# Patient Record
Sex: Female | Born: 1960 | Race: White | Hispanic: No | Marital: Married | Smoking: Former smoker
Health system: Southern US, Community
[De-identification: ages and names within clinical notes are randomized; demographics above are authoritative.]

## PROBLEM LIST (undated history)

## (undated) DIAGNOSIS — E0590 Thyrotoxicosis, unspecified without thyrotoxic crisis or storm: Secondary | ICD-10-CM

## (undated) DIAGNOSIS — G894 Chronic pain syndrome: Secondary | ICD-10-CM

## (undated) DIAGNOSIS — E119 Type 2 diabetes mellitus without complications: Secondary | ICD-10-CM

## (undated) DIAGNOSIS — J449 Chronic obstructive pulmonary disease, unspecified: Secondary | ICD-10-CM

## (undated) DIAGNOSIS — G43909 Migraine, unspecified, not intractable, without status migrainosus: Secondary | ICD-10-CM

## (undated) DIAGNOSIS — I10 Essential (primary) hypertension: Secondary | ICD-10-CM

## (undated) DIAGNOSIS — E876 Hypokalemia: Secondary | ICD-10-CM

## (undated) DIAGNOSIS — F1110 Opioid abuse, uncomplicated: Secondary | ICD-10-CM

## (undated) DIAGNOSIS — M069 Rheumatoid arthritis, unspecified: Secondary | ICD-10-CM

## (undated) DIAGNOSIS — N183 Chronic kidney disease, stage 3 unspecified: Secondary | ICD-10-CM

## (undated) DIAGNOSIS — I2510 Atherosclerotic heart disease of native coronary artery without angina pectoris: Secondary | ICD-10-CM

## (undated) DIAGNOSIS — E7800 Pure hypercholesterolemia, unspecified: Secondary | ICD-10-CM

## (undated) DIAGNOSIS — R51 Headache: Secondary | ICD-10-CM

## (undated) DIAGNOSIS — E875 Hyperkalemia: Secondary | ICD-10-CM

## (undated) DIAGNOSIS — K859 Acute pancreatitis without necrosis or infection, unspecified: Secondary | ICD-10-CM

## (undated) DIAGNOSIS — F329 Major depressive disorder, single episode, unspecified: Secondary | ICD-10-CM

## (undated) DIAGNOSIS — F32A Depression, unspecified: Secondary | ICD-10-CM

## (undated) DIAGNOSIS — Z9289 Personal history of other medical treatment: Secondary | ICD-10-CM

## (undated) HISTORY — DX: Personal history of other medical treatment: Z92.89

## (undated) HISTORY — PX: WISDOM TOOTH EXTRACTION: SHX21

## (undated) HISTORY — DX: Atherosclerotic heart disease of native coronary artery without angina pectoris: I25.10

## (undated) HISTORY — PX: NECK SURGERY: SHX720

## (undated) HISTORY — PX: TUBAL LIGATION: SHX77

## (undated) HISTORY — PX: CHOLECYSTECTOMY: SHX55

## (undated) HISTORY — PX: FRACTURE SURGERY: SHX138

## (undated) HISTORY — PX: CERVICAL POLYPECTOMY: SHX88

## (undated) HISTORY — DX: Chronic kidney disease, stage 3 unspecified: N18.30

---

## 2000-01-17 ENCOUNTER — Encounter: Payer: Self-pay | Admitting: Neurosurgery

## 2000-01-17 ENCOUNTER — Ambulatory Visit (HOSPITAL_COMMUNITY): Admission: RE | Admit: 2000-01-17 | Discharge: 2000-01-17 | Payer: Self-pay | Admitting: Neurosurgery

## 2000-02-14 ENCOUNTER — Ambulatory Visit (HOSPITAL_COMMUNITY): Admission: RE | Admit: 2000-02-14 | Discharge: 2000-02-14 | Payer: Self-pay | Admitting: Neurosurgery

## 2000-05-28 ENCOUNTER — Ambulatory Visit (HOSPITAL_COMMUNITY): Admission: RE | Admit: 2000-05-28 | Discharge: 2000-05-29 | Payer: Self-pay | Admitting: Neurosurgery

## 2000-05-28 ENCOUNTER — Encounter: Payer: Self-pay | Admitting: Neurosurgery

## 2004-05-02 ENCOUNTER — Emergency Department: Payer: Self-pay | Admitting: Emergency Medicine

## 2004-08-20 ENCOUNTER — Other Ambulatory Visit: Payer: Self-pay

## 2004-08-20 ENCOUNTER — Emergency Department: Payer: Self-pay | Admitting: Emergency Medicine

## 2004-09-30 ENCOUNTER — Ambulatory Visit: Payer: Self-pay | Admitting: Gastroenterology

## 2005-07-03 ENCOUNTER — Other Ambulatory Visit: Payer: Self-pay

## 2005-07-03 ENCOUNTER — Inpatient Hospital Stay: Payer: Self-pay | Admitting: Internal Medicine

## 2005-07-05 ENCOUNTER — Other Ambulatory Visit: Payer: Self-pay

## 2005-07-06 ENCOUNTER — Other Ambulatory Visit: Payer: Self-pay

## 2006-06-04 ENCOUNTER — Other Ambulatory Visit: Payer: Self-pay

## 2006-06-04 ENCOUNTER — Emergency Department: Payer: Self-pay | Admitting: Internal Medicine

## 2006-08-10 ENCOUNTER — Other Ambulatory Visit: Payer: Self-pay

## 2006-08-10 ENCOUNTER — Inpatient Hospital Stay: Payer: Self-pay | Admitting: Internal Medicine

## 2006-08-11 ENCOUNTER — Inpatient Hospital Stay: Payer: Self-pay | Admitting: Psychiatry

## 2006-08-14 ENCOUNTER — Ambulatory Visit: Payer: Self-pay | Admitting: Unknown Physician Specialty

## 2006-08-18 ENCOUNTER — Ambulatory Visit: Payer: Self-pay | Admitting: Unknown Physician Specialty

## 2006-09-18 ENCOUNTER — Ambulatory Visit: Payer: Self-pay | Admitting: Unknown Physician Specialty

## 2006-10-19 ENCOUNTER — Ambulatory Visit: Payer: Self-pay | Admitting: Unknown Physician Specialty

## 2007-01-15 ENCOUNTER — Other Ambulatory Visit: Payer: Self-pay

## 2007-01-15 ENCOUNTER — Inpatient Hospital Stay: Payer: Self-pay | Admitting: Internal Medicine

## 2007-03-23 ENCOUNTER — Ambulatory Visit: Payer: Self-pay | Admitting: Unknown Physician Specialty

## 2007-04-18 ENCOUNTER — Ambulatory Visit: Payer: Self-pay | Admitting: Unknown Physician Specialty

## 2007-05-19 ENCOUNTER — Ambulatory Visit: Payer: Self-pay | Admitting: Unknown Physician Specialty

## 2008-03-07 ENCOUNTER — Inpatient Hospital Stay: Payer: Self-pay | Admitting: Psychiatry

## 2008-03-10 ENCOUNTER — Ambulatory Visit: Payer: Self-pay | Admitting: Unknown Physician Specialty

## 2008-03-20 ENCOUNTER — Ambulatory Visit: Payer: Self-pay | Admitting: Unknown Physician Specialty

## 2008-04-17 ENCOUNTER — Ambulatory Visit: Payer: Self-pay | Admitting: Unknown Physician Specialty

## 2008-10-24 ENCOUNTER — Emergency Department: Payer: Self-pay | Admitting: Emergency Medicine

## 2008-10-24 ENCOUNTER — Inpatient Hospital Stay: Payer: Self-pay | Admitting: Psychiatry

## 2009-07-30 ENCOUNTER — Inpatient Hospital Stay: Payer: Self-pay | Admitting: Unknown Physician Specialty

## 2009-08-14 ENCOUNTER — Ambulatory Visit: Payer: Self-pay | Admitting: Unknown Physician Specialty

## 2009-08-17 ENCOUNTER — Ambulatory Visit: Payer: Self-pay | Admitting: Unknown Physician Specialty

## 2009-09-17 ENCOUNTER — Ambulatory Visit: Payer: Self-pay | Admitting: Unknown Physician Specialty

## 2009-12-11 ENCOUNTER — Emergency Department: Payer: Self-pay | Admitting: Emergency Medicine

## 2010-02-09 ENCOUNTER — Emergency Department (HOSPITAL_COMMUNITY)
Admission: EM | Admit: 2010-02-09 | Discharge: 2010-02-09 | Payer: Self-pay | Source: Home / Self Care | Admitting: Emergency Medicine

## 2010-07-05 NOTE — Op Note (Signed)
[place]. [facility]  Patient:    Whitney Bernard, Whitney Bernard                          MRN: [id number] Proc. Date: 05/28/00 Adm. Date:  [id number] Attending:  Gerald Dexter                           Operative Report  PREOPERATIVE DIAGNOSIS:  Herniated disk C6-7 left.  POSTOPERATIVE DIAGNOSIS: Herniated disk C6-7 left.  OPERATION:   C6-7 anterior cervical diskectomy with fibular bone bank fusion with operating microscope.  SURGEON:  Reinaldo Meeker, M.D.  ASSISTANT:  Donalee Citrin, Jr.  DESCRIPTION OF PROCEDURE:  After being placed in the supine position and 5 pounds of halter traction, the patients neck was prepped and draped in the usual sterile fashion.  Localizing X-rays taken prior to incision to identify the appropriate level.  A transverse incision was then made in the right anterior neck starting at the midline and headed towards the medial aspect of the sternocleidomastoid muscle.  The platysma muscle was then incised transversely.  The natural fascial plane between the strap muscles medially and the sternocleidomastoid laterally was identified and followed down to the anterior aspect of the cervical spine.  Longus colli muscles were identified and split in the midline, and stripped away bilaterally with again resecting Key elevator.   Self-retaining retractor was placed for exposure.  A second x-ray was taken to confirm approach to the C6-7 level, and this was correct. Using a #15 blade, the annulus of the disk was incised.  Using pituitary rongeurs and curets, approximately 90% of the disk material was removed. High-speed drill was used to widen the interspace.  The microscope was draped, brought into the field, and used for the remainder of the case.  Using the customary high magnification, the remainder of the disk material at the level of the posterior longitudinal ligament was removed.  The ligament was then incised transversely.  The cut edges were  removed with the Kerrison punch. Very thorough decompression was carried out to the proximal foramen on the right where the nerve root was found to be not compressed.  As exposure was carried out towards the left, symptomatic side, a large amount of disk material within the foramen was identified and removed in a piecemeal fashion which ave excellent decompression of the underlying C7 nerve root.  At this point, inspection was carried out in all directions for any evidence of residual compression.  None could be identified.  Large amounts of irrigation were carried out.  Bleeding was controlled by coagulation and Gelfoam. Measurements were taken and 7 mm bone bank plugs reconstituted.  After again once more confirming hemostasis, the plug was impacted without difficulty. Final x-rays showed the plug to be in good position.  At this point, large amounts of irrigation were carried out and any bleeding controlled with bipolar coagulation.  The wound was then closed using interrupted Vicryl on the platysma muscle, inverted 5-0 PDS on the subcuticular layer, and Steri-Strips on the skin.  A sterile dressing and soft collar were applied. The patient was extubated and taken to recovery room in stable condition. DD:  05/28/00 TD:  05/28/00 Job: [id number]

## 2011-02-18 ENCOUNTER — Inpatient Hospital Stay: Payer: Self-pay | Admitting: Psychiatry

## 2011-02-18 LAB — BASIC METABOLIC PANEL
BUN: 28 mg/dL — ABNORMAL HIGH (ref 7–18)
Calcium, Total: 8.4 mg/dL — ABNORMAL LOW (ref 8.5–10.1)
Creatinine: 1.41 mg/dL — ABNORMAL HIGH (ref 0.60–1.30)
EGFR (African American): 50 — ABNORMAL LOW
EGFR (Non-African Amer.): 42 — ABNORMAL LOW
Glucose: 128 mg/dL — ABNORMAL HIGH (ref 65–99)
Potassium: 2.8 mmol/L — ABNORMAL LOW (ref 3.5–5.1)
Sodium: 144 mmol/L (ref 136–145)

## 2011-02-21 LAB — COMPREHENSIVE METABOLIC PANEL
Alkaline Phosphatase: 91 U/L (ref 50–136)
BUN: 20 mg/dL — ABNORMAL HIGH (ref 7–18)
Bilirubin,Total: 0.4 mg/dL (ref 0.2–1.0)
Chloride: 94 mmol/L — ABNORMAL LOW (ref 98–107)
Co2: 33 mmol/L — ABNORMAL HIGH (ref 21–32)
Creatinine: 1.35 mg/dL — ABNORMAL HIGH (ref 0.60–1.30)
EGFR (Non-African Amer.): 44 — ABNORMAL LOW
Glucose: 130 mg/dL — ABNORMAL HIGH (ref 65–99)
Osmolality: 276 (ref 275–301)
Potassium: 3 mmol/L — ABNORMAL LOW (ref 3.5–5.1)
Sodium: 136 mmol/L (ref 136–145)
Total Protein: 8 g/dL (ref 6.4–8.2)

## 2011-02-21 LAB — VALPROIC ACID LEVEL: Valproic Acid: 75 ug/mL

## 2011-10-12 ENCOUNTER — Emergency Department (HOSPITAL_COMMUNITY): Payer: 59

## 2011-10-12 ENCOUNTER — Emergency Department (HOSPITAL_COMMUNITY)
Admission: EM | Admit: 2011-10-12 | Discharge: 2011-10-12 | Disposition: A | Discharge status: Home / Self Care | Payer: 59 | Attending: Emergency Medicine | Admitting: Emergency Medicine

## 2011-10-12 ENCOUNTER — Encounter (HOSPITAL_COMMUNITY): Payer: Self-pay

## 2011-10-12 DIAGNOSIS — Z9089 Acquired absence of other organs: Secondary | ICD-10-CM | POA: Insufficient documentation

## 2011-10-12 DIAGNOSIS — R071 Chest pain on breathing: Secondary | ICD-10-CM | POA: Insufficient documentation

## 2011-10-12 DIAGNOSIS — M069 Rheumatoid arthritis, unspecified: Secondary | ICD-10-CM | POA: Insufficient documentation

## 2011-10-12 DIAGNOSIS — R0789 Other chest pain: Secondary | ICD-10-CM

## 2011-10-12 DIAGNOSIS — I10 Essential (primary) hypertension: Secondary | ICD-10-CM | POA: Insufficient documentation

## 2011-10-12 DIAGNOSIS — F17200 Nicotine dependence, unspecified, uncomplicated: Secondary | ICD-10-CM | POA: Insufficient documentation

## 2011-10-12 DIAGNOSIS — G8929 Other chronic pain: Secondary | ICD-10-CM | POA: Insufficient documentation

## 2011-10-12 DIAGNOSIS — E785 Hyperlipidemia, unspecified: Secondary | ICD-10-CM | POA: Insufficient documentation

## 2011-10-12 HISTORY — DX: Pure hypercholesterolemia, unspecified: E78.00

## 2011-10-12 HISTORY — DX: Essential (primary) hypertension: I10

## 2011-10-12 HISTORY — DX: Rheumatoid arthritis, unspecified: M06.9

## 2011-10-12 LAB — CBC
HCT: 36.2 % (ref 36.0–46.0)
Hemoglobin: 11.6 g/dL — ABNORMAL LOW (ref 12.0–15.0)
MCH: 29.7 pg (ref 26.0–34.0)
MCHC: 32 g/dL (ref 30.0–36.0)
MCV: 92.6 fL (ref 78.0–100.0)
Platelets: 395 K/uL (ref 150–400)
RBC: 3.91 MIL/uL (ref 3.87–5.11)
RDW: 14.8 % (ref 11.5–15.5)
WBC: 8 K/uL (ref 4.0–10.5)

## 2011-10-12 LAB — DIFFERENTIAL
Basophils Absolute: 0.1 K/uL (ref 0.0–0.1)
Basophils Relative: 1 % (ref 0–1)
Eosinophils Absolute: 0.4 K/uL (ref 0.0–0.7)
Eosinophils Relative: 5 % (ref 0–5)
Lymphocytes Relative: 31 % (ref 12–46)
Lymphs Abs: 2.4 K/uL (ref 0.7–4.0)
Monocytes Absolute: 0.6 K/uL (ref 0.1–1.0)
Monocytes Relative: 7 % (ref 3–12)
Neutro Abs: 4.6 K/uL (ref 1.7–7.7)
Neutrophils Relative %: 57 % (ref 43–77)

## 2011-10-12 LAB — COMPREHENSIVE METABOLIC PANEL WITH GFR
ALT: 15 U/L (ref 0–35)
AST: 14 U/L (ref 0–37)
Albumin: 3.7 g/dL (ref 3.5–5.2)
Alkaline Phosphatase: 141 U/L — ABNORMAL HIGH (ref 39–117)
BUN: 20 mg/dL (ref 6–23)
CO2: 22 meq/L (ref 19–32)
Calcium: 9.7 mg/dL (ref 8.4–10.5)
Chloride: 102 meq/L (ref 96–112)
Creatinine, Ser: 1.59 mg/dL — ABNORMAL HIGH (ref 0.50–1.10)
GFR calc Af Amer: 42 mL/min — ABNORMAL LOW
GFR calc non Af Amer: 36 mL/min — ABNORMAL LOW
Glucose, Bld: 94 mg/dL (ref 70–99)
Potassium: 3.8 meq/L (ref 3.5–5.1)
Sodium: 138 meq/L (ref 135–145)
Total Bilirubin: 0.1 mg/dL — ABNORMAL LOW (ref 0.3–1.2)
Total Protein: 8.5 g/dL — ABNORMAL HIGH (ref 6.0–8.3)

## 2011-10-12 LAB — TROPONIN I
Troponin I: <0.3 ng/mL
Troponin I: <0.3 ng/mL (ref <0.30)

## 2011-10-12 MED ORDER — ASPIRIN 81 MG PO CHEW
324.0000 mg | CHEWABLE_TABLET | Freq: Once | ORAL | Status: AC
  Administered 2011-10-12: 324 mg via ORAL
  Filled 2011-10-12: qty 4

## 2011-10-12 MED ORDER — NITROGLYCERIN 0.4 MG SL SUBL
0.4000 mg | SUBLINGUAL_TABLET | SUBLINGUAL | Status: DC | PRN
  Administered 2011-10-12: 0.4 mg via SUBLINGUAL
  Filled 2011-10-12: qty 25

## 2011-10-12 MED ORDER — KETOROLAC TROMETHAMINE 30 MG/ML IJ SOLN
30.0000 mg | Freq: Once | INTRAMUSCULAR | Status: AC
  Administered 2011-10-12: 30 mg via INTRAVENOUS
  Filled 2011-10-12: qty 1

## 2011-10-12 NOTE — ED Notes (Signed)
1.Pt reports that she checked her bp today at [facility] and here bp was high. 2. Chest pain since yesterday, pain comes and goes, sob at times,  Pain is midsternal no radiation.  Lightheaded  And episodes of nausea at times. 3. Has chronic pain from ra and needs something for pain.

## 2011-10-12 NOTE — ED Notes (Signed)
Went to start IV, draw blood, and give medication, patient made aware. Patient requesting to go to bathroom first. Patient ambulated to bathroom with steady gait. When being assisted back to room patient reports dizziness, and states she could not walk. Tech to get wheelchair. Patient stated "I cant' walk." Patient eased to floor by staff. Patient sat in floor and c/o no IV being started and no medicine being given. Patient states "I could be dying and ya'll wouldn't care.You didn't put an IV in when I first came in." Patient informed that we needed and got a ekg and chest x-ray, as well as EDP came to see her before we could start IV. Patient advocate at patient's side. Patient requesting to leave AMA and refusing to sign any paper work. EDP made aware. While talking to EDP patient agreed with staff to stay and take ordered nitro.

## 2011-10-12 NOTE — ED Notes (Signed)
Patient denies any relief with SL nitro x2. Blood pressure 100/67. Patient states "It feels like something is sitting on my chest." Reports shortness of breath. O2 sat 94 on room air. O2 applied via [place]. EDP made aware-order given.

## 2011-10-12 NOTE — ED Provider Notes (Signed)
History     CSN: [id number]  Arrival date & time 10/12/11  1616   First MD Initiated Contact with Patient 10/12/11 1631      Chief Complaint  Patient presents with  . Hypertension  . Chest Pain  . Shortness of Breath  . Pain    (Consider location/radiation/quality/duration/timing/severity/associated sxs/prior treatment) HPI  Patient states she is here because of blood pressure.  States she just started bp one week ago and told to check her bp frequently and checked at [facility] today at 191/122, so drove directly here.  This occurred at 2 p.m. Patient states she drove here from [place] because her husband works here.   Took her lisinopril 10 mg at 2 p.m. PMD is in [place].  Patient states chest pain began last night, pain under left breast feels like pressure with some radiation to bilateral arms.  Associated symptoms- nausea, dyspnea.  Pain constant, increases with husband- he's an ass yelled at her last night when she told him it hurt.  Patient took ibuprofen without relief.  Positive tobacco, fh- father mi in 47s.  Cardiology Dr. Gwen Pounds saw five years ago due to referral from pmd- states had echo normal.    Past Medical History  Diagnosis Date  . Hypertension   . Hypercholesteremia   . RA (rheumatoid arthritis)     Past Surgical History  Procedure Date  . Fracture surgery   . Cholecystectomy   . Cervical polypectomy   . Tubal ligation   . Neck surgery   . Wisdom tooth extraction     No family history on file.  History  Substance Use Topics  . Smoking status: Current Everyday Smoker    Types: Cigarettes  . Smokeless tobacco: Not on file  . Alcohol Use: Yes     occ    OB History    Grav Para Term Preterm Abortions TAB SAB Ect Mult Living                  Review of Systems  All other systems reviewed and are negative.    Allergies  Review of patient's allergies indicates not on file.  Home Medications  No current outpatient prescriptions on  file.  BP 118/66  Pulse 96  Temp 98.8 F (37.1 C) (Oral)  Resp 20  Ht 5\' 4"  (1.626 m)  Wt 157 lb (71.215 kg)  BMI 26.95 kg/m2  SpO2 96%  Physical Exam  Nursing note and vitals reviewed. Constitutional: She appears well-developed and well-nourished.  HENT:  Head: Normocephalic and atraumatic.  Eyes: Conjunctivae and EOM are normal. Pupils are equal, round, and reactive to light.  Neck: Normal range of motion. Neck supple.  Cardiovascular: Normal rate, regular rhythm, normal heart sounds and intact distal pulses.        Patient is diffusely tender to palpation across chest reproducing pain.  Pulmonary/Chest: Effort normal and breath sounds normal.  Abdominal: Soft. Bowel sounds are normal.  Musculoskeletal: Normal range of motion.  Neurological: She is alert.  Skin: Skin is warm and dry.  Psychiatric: Thought content normal. Her speech is rapid and/or pressured.       Patient is irritable    ED Course  Procedures (including critical care time)  Labs Reviewed - No data to display No results found.   No diagnosis found.    Date: 10/12/2011  Rate: 96  Rhythm: normal sinus rhythm  QRS Axis: normal  Intervals: normal  ST/T Wave abnormalities: normal  Conduction Disutrbances: none  Narrative Interpretation: unremarkable      MDM  Patient without any change in her pain with nitroglycerin. She had 3 sublingual nitroglycerin. She continues to request pain medicine for her hands. She was given Toradol. She continues to request narcotic pain medicine. She states that she's had the hand pain for an extended period of time it is worse. Her hand exam appears normal. She stated that she had chest pain since last night she's had 2 sets of troponins here that are normal with a normal EKG. Patient states that she is bipolar and has had decreased sleep. She is not suicidal or homicidal. Patient is very irritable. She appears to be somewhat fixated on the pain medicine. She is advised  she can get should get pain medicine from her primary care Dr. For her chronic pain her chest pain appears to be musculoskeletal with reproducible pain on her chest exam a normal EKG and 2 sets of normal troponins with chest pain going on for greater than 24 hours.        Hilario Quarry, MD 10/12/11 [id number]

## 2011-10-12 NOTE — ED Notes (Signed)
During triage of pt she stated she was here because her bp was elevated and then stated she was having severe pain from her RA, rn then asked, if she was here for chest pain, because reg. Listed as her chief complaint. She then stated "i told you why i am here" and "if you can't help me then i'll leave now", attempts made to calm pt, and attempted to explain that we needed to know why she was here for proper treatment. Pt then laid down on stretcher and calmly answered all ?'s and allowed staff to obtain ekg and place on monitors.

## 2011-10-12 NOTE — ED Notes (Signed)
Patient to restroom. Ambulatory. After restroom on the way back to her room patient wanted to sit in the floor. Patient allowed to sit in floor at request by tech and RN. Patient then assisted into wheel chair by tech and RN and escorted back to room. Patient sts "if ya'll arent going to do anything for me I need to leave". Explained to patient that the proper procedures have taken place for her complaint. And EKG has been completed, x-ray completed, MD came to the bedside and placed orders for IV, blood work, and medication. Patient states understanding, and keeps complaining that "no one is doing anything for her" patients RN and charge RN made aware. This situation witnessed by Barbara Cower, [place], Samara Snide, patient advocate, Jerrell Mylar RN.

## 2012-02-26 LAB — CBC
HGB: 13.5 g/dL (ref 12.0–16.0)
MCHC: 33 g/dL (ref 32.0–36.0)
Platelet: 389 10*3/uL (ref 150–440)
RDW: 15.4 % — ABNORMAL HIGH (ref 11.5–14.5)

## 2012-02-26 LAB — HEPATIC FUNCTION PANEL A (ARMC)
Albumin: 3.7 g/dL (ref 3.4–5.0)
Bilirubin, Direct: <0.1 mg/dL (ref 0.00–0.20)
Bilirubin,Total: 0.2 mg/dL (ref 0.2–1.0)
SGOT(AST): 13 U/L — ABNORMAL LOW (ref 15–37)
SGPT (ALT): 10 U/L — ABNORMAL LOW (ref 12–78)
Total Protein: 8.3 g/dL — ABNORMAL HIGH (ref 6.4–8.2)

## 2012-02-26 LAB — BASIC METABOLIC PANEL
Anion Gap: 10 (ref 7–16)
BUN: 32 mg/dL — ABNORMAL HIGH (ref 7–18)
Calcium, Total: 9.4 mg/dL (ref 8.5–10.1)
Chloride: 112 mmol/L — ABNORMAL HIGH (ref 98–107)
Glucose: 111 mg/dL — ABNORMAL HIGH (ref 65–99)
Potassium: 3.9 mmol/L (ref 3.5–5.1)
Sodium: 138 mmol/L (ref 136–145)

## 2012-02-26 LAB — URINALYSIS, COMPLETE
Ketone: NEGATIVE
Nitrite: NEGATIVE
RBC,UR: <1 /HPF (ref 0–5)
Specific Gravity: 1.015 (ref 1.003–1.030)
Squamous Epithelial: 2

## 2012-02-26 LAB — TROPONIN I: Troponin-I: <0.02 ng/mL

## 2012-02-26 LAB — CK TOTAL AND CKMB (NOT AT ARMC): CK, Total: 26 U/L (ref 21–215)

## 2012-02-27 ENCOUNTER — Inpatient Hospital Stay: Payer: Self-pay | Admitting: Specialist

## 2012-02-27 LAB — BASIC METABOLIC PANEL
Anion Gap: 9 (ref 7–16)
BUN: 27 mg/dL — ABNORMAL HIGH (ref 7–18)
Calcium, Total: 8.4 mg/dL — ABNORMAL LOW (ref 8.5–10.1)
Chloride: 117 mmol/L — ABNORMAL HIGH (ref 98–107)
Co2: 15 mmol/L — ABNORMAL LOW (ref 21–32)
Creatinine: 1.82 mg/dL — ABNORMAL HIGH (ref 0.60–1.30)
Glucose: 100 mg/dL — ABNORMAL HIGH (ref 65–99)
Osmolality: 286 (ref 275–301)
Sodium: 141 mmol/L (ref 136–145)

## 2012-02-28 LAB — COMPREHENSIVE METABOLIC PANEL
Alkaline Phosphatase: 108 U/L (ref 50–136)
BUN: 21 mg/dL — ABNORMAL HIGH (ref 7–18)
Calcium, Total: 8 mg/dL — ABNORMAL LOW (ref 8.5–10.1)
Chloride: 118 mmol/L — ABNORMAL HIGH (ref 98–107)
Co2: 14 mmol/L — ABNORMAL LOW (ref 21–32)
Creatinine: 1.6 mg/dL — ABNORMAL HIGH (ref 0.60–1.30)
EGFR (African American): 42 — ABNORMAL LOW
Glucose: 90 mg/dL (ref 65–99)
Potassium: 3.1 mmol/L — ABNORMAL LOW (ref 3.5–5.1)
SGOT(AST): 10 U/L — ABNORMAL LOW (ref 15–37)
Sodium: 143 mmol/L (ref 136–145)
Total Protein: 6 g/dL — ABNORMAL LOW (ref 6.4–8.2)

## 2012-02-28 LAB — LIPASE, BLOOD: Lipase: 655 U/L — ABNORMAL HIGH (ref 73–393)

## 2012-02-29 LAB — BASIC METABOLIC PANEL
Calcium, Total: 7.9 mg/dL — ABNORMAL LOW (ref 8.5–10.1)
Chloride: 117 mmol/L — ABNORMAL HIGH (ref 98–107)
Co2: 14 mmol/L — ABNORMAL LOW (ref 21–32)
Creatinine: 1.43 mg/dL — ABNORMAL HIGH (ref 0.60–1.30)
EGFR (Non-African Amer.): 42 — ABNORMAL LOW

## 2012-02-29 LAB — LIPASE, BLOOD: Lipase: 339 U/L (ref 73–393)

## 2012-03-01 LAB — LIPASE, BLOOD: Lipase: 191 U/L (ref 73–393)

## 2012-03-03 LAB — PATHOLOGY REPORT

## 2012-03-12 ENCOUNTER — Inpatient Hospital Stay: Payer: Self-pay | Admitting: Internal Medicine

## 2012-03-12 LAB — CBC
MCH: 30.1 pg (ref 26.0–34.0)
MCHC: 32.6 g/dL (ref 32.0–36.0)
Platelet: 351 10*3/uL (ref 150–440)
WBC: 13 10*3/uL — ABNORMAL HIGH (ref 3.6–11.0)

## 2012-03-12 LAB — COMPREHENSIVE METABOLIC PANEL
Anion Gap: 11 (ref 7–16)
BUN: 37 mg/dL — ABNORMAL HIGH (ref 7–18)
Calcium, Total: 8.1 mg/dL — ABNORMAL LOW (ref 8.5–10.1)
Chloride: 105 mmol/L (ref 98–107)
Co2: 25 mmol/L (ref 21–32)
EGFR (African American): 52 — ABNORMAL LOW
Glucose: 117 mg/dL — ABNORMAL HIGH (ref 65–99)
Osmolality: 291 (ref 275–301)
Potassium: 4 mmol/L (ref 3.5–5.1)
SGOT(AST): 33 U/L (ref 15–37)
SGPT (ALT): 34 U/L (ref 12–78)
Sodium: 141 mmol/L (ref 136–145)

## 2012-03-12 LAB — URINALYSIS, COMPLETE
Bilirubin,UR: NEGATIVE
Blood: NEGATIVE
Glucose,UR: NEGATIVE mg/dL (ref 0–75)
RBC,UR: 2 /HPF (ref 0–5)
Specific Gravity: 1.024 (ref 1.003–1.030)
Squamous Epithelial: 2

## 2012-03-12 LAB — LIPASE, BLOOD: Lipase: 687 U/L — ABNORMAL HIGH (ref 73–393)

## 2012-03-13 LAB — BASIC METABOLIC PANEL
Anion Gap: 9 (ref 7–16)
Co2: 22 mmol/L (ref 21–32)
Creatinine: 1.1 mg/dL (ref 0.60–1.30)
EGFR (African American): >60
EGFR (Non-African Amer.): 57 — ABNORMAL LOW
Glucose: 85 mg/dL (ref 65–99)
Osmolality: 284 (ref 275–301)
Potassium: 4.2 mmol/L (ref 3.5–5.1)
Sodium: 142 mmol/L (ref 136–145)

## 2012-03-13 LAB — LIPID PANEL
Cholesterol: 211 mg/dL — ABNORMAL HIGH (ref 0–200)
HDL Cholesterol: 36 mg/dL — ABNORMAL LOW (ref 40–60)
Ldl Cholesterol, Calc: 103 mg/dL — ABNORMAL HIGH (ref 0–100)
VLDL Cholesterol, Calc: 72 mg/dL — ABNORMAL HIGH (ref 5–40)

## 2012-03-13 LAB — CBC WITH DIFFERENTIAL/PLATELET
Basophil #: 0.1 10*3/uL (ref 0.0–0.1)
Basophil %: 1.3 %
HGB: 10 g/dL — ABNORMAL LOW (ref 12.0–16.0)
Lymphocyte #: 2.6 10*3/uL (ref 1.0–3.6)
Lymphocyte %: 35.6 %
Monocyte %: 11.2 %
Neutrophil #: 3.3 10*3/uL (ref 1.4–6.5)

## 2012-03-13 LAB — LIPASE, BLOOD: Lipase: 186 U/L (ref 73–393)

## 2012-03-15 LAB — BASIC METABOLIC PANEL
BUN: 12 mg/dL (ref 7–18)
Calcium, Total: 8.5 mg/dL (ref 8.5–10.1)
Chloride: 108 mmol/L — ABNORMAL HIGH (ref 98–107)
Co2: 26 mmol/L (ref 21–32)
Creatinine: 1.19 mg/dL (ref 0.60–1.30)
EGFR (African American): >60
EGFR (Non-African Amer.): 52 — ABNORMAL LOW
Sodium: 141 mmol/L (ref 136–145)

## 2012-03-16 LAB — STOOL CULTURE

## 2012-03-23 DIAGNOSIS — K279 Peptic ulcer, site unspecified, unspecified as acute or chronic, without hemorrhage or perforation: Secondary | ICD-10-CM | POA: Insufficient documentation

## 2012-04-02 ENCOUNTER — Emergency Department: Payer: Self-pay | Admitting: Emergency Medicine

## 2012-04-02 LAB — COMPREHENSIVE METABOLIC PANEL
Albumin: 4 g/dL (ref 3.4–5.0)
BUN: 16 mg/dL (ref 7–18)
Bilirubin,Total: 0.2 mg/dL (ref 0.2–1.0)
Calcium, Total: 9.2 mg/dL (ref 8.5–10.1)
Chloride: 107 mmol/L (ref 98–107)
Co2: 16 mmol/L — ABNORMAL LOW (ref 21–32)
Creatinine: 1.51 mg/dL — ABNORMAL HIGH (ref 0.60–1.30)
Glucose: 136 mg/dL — ABNORMAL HIGH (ref 65–99)
Osmolality: 277 (ref 275–301)
Potassium: 3.1 mmol/L — ABNORMAL LOW (ref 3.5–5.1)
Sodium: 137 mmol/L (ref 136–145)
Total Protein: 8.4 g/dL — ABNORMAL HIGH (ref 6.4–8.2)

## 2012-04-02 LAB — URINALYSIS, COMPLETE
Blood: NEGATIVE
Glucose,UR: NEGATIVE mg/dL (ref 0–75)
Hyaline Cast: 4
Ketone: NEGATIVE
Ph: 6 (ref 4.5–8.0)
Protein: 75
RBC,UR: 2 /HPF (ref 0–5)
Specific Gravity: 1.03 (ref 1.003–1.030)
Squamous Epithelial: 7

## 2012-04-02 LAB — DRUG SCREEN, URINE
Cannabinoid 50 Ng, Ur ~~LOC~~: NEGATIVE (ref ?–50)
Cocaine Metabolite,Ur ~~LOC~~: NEGATIVE (ref ?–300)
Methadone, Ur Screen: NEGATIVE (ref ?–300)
Opiate, Ur Screen: NEGATIVE (ref ?–300)
Phencyclidine (PCP) Ur S: NEGATIVE (ref ?–25)
Tricyclic, Ur Screen: POSITIVE (ref ?–1000)

## 2012-04-02 LAB — CBC
HGB: 13.7 g/dL (ref 12.0–16.0)
MCHC: 31.7 g/dL — ABNORMAL LOW (ref 32.0–36.0)
RBC: 4.75 10*6/uL (ref 3.80–5.20)
RDW: 15.3 % — ABNORMAL HIGH (ref 11.5–14.5)
WBC: 12.8 10*3/uL — ABNORMAL HIGH (ref 3.6–11.0)

## 2012-04-02 LAB — ETHANOL: Ethanol %: <0.003 % (ref 0.000–0.080)

## 2012-04-02 LAB — LIPASE, BLOOD: Lipase: 85 U/L (ref 73–393)

## 2012-04-04 LAB — URINE CULTURE

## 2012-04-11 ENCOUNTER — Inpatient Hospital Stay: Payer: Self-pay | Admitting: Internal Medicine

## 2012-04-11 LAB — URINALYSIS, COMPLETE
Bacteria: NONE SEEN
Bilirubin,UR: NEGATIVE
Blood: NEGATIVE
Glucose,UR: NEGATIVE mg/dL (ref 0–75)
Ph: 6 (ref 4.5–8.0)
Protein: 30
Squamous Epithelial: 1

## 2012-04-11 LAB — COMPREHENSIVE METABOLIC PANEL
Alkaline Phosphatase: 169 U/L — ABNORMAL HIGH (ref 50–136)
Bilirubin,Total: 0.2 mg/dL (ref 0.2–1.0)
Calcium, Total: 8.8 mg/dL (ref 8.5–10.1)
Creatinine: 1.58 mg/dL — ABNORMAL HIGH (ref 0.60–1.30)
EGFR (African American): 43 — ABNORMAL LOW
Osmolality: 277 (ref 275–301)
SGPT (ALT): 10 U/L — ABNORMAL LOW (ref 12–78)
Sodium: 138 mmol/L (ref 136–145)
Total Protein: 8.1 g/dL (ref 6.4–8.2)

## 2012-04-11 LAB — LIPASE, BLOOD: Lipase: 100 U/L (ref 73–393)

## 2012-04-11 LAB — CBC
HGB: 14.6 g/dL (ref 12.0–16.0)
MCH: 29.4 pg (ref 26.0–34.0)
MCHC: 33.5 g/dL (ref 32.0–36.0)
Platelet: 443 10*3/uL — ABNORMAL HIGH (ref 150–440)
RDW: 16.1 % — ABNORMAL HIGH (ref 11.5–14.5)

## 2012-04-11 LAB — MAGNESIUM: Magnesium: 1.5 mg/dL — ABNORMAL LOW

## 2012-04-12 LAB — CBC WITH DIFFERENTIAL/PLATELET
Basophil #: 0.1 10*3/uL (ref 0.0–0.1)
Basophil %: 0.6 %
Lymphocyte #: 2 10*3/uL (ref 1.0–3.6)
MCH: 29.1 pg (ref 26.0–34.0)
MCHC: 32.8 g/dL (ref 32.0–36.0)
Monocyte #: 1.3 x10 3/mm — ABNORMAL HIGH (ref 0.2–0.9)
Monocyte %: 8.5 %
Neutrophil #: 11.6 10*3/uL — ABNORMAL HIGH (ref 1.4–6.5)
Neutrophil %: 76.6 %
RBC: 4.04 10*6/uL (ref 3.80–5.20)
WBC: 15.2 10*3/uL — ABNORMAL HIGH (ref 3.6–11.0)

## 2012-04-12 LAB — BASIC METABOLIC PANEL
Anion Gap: 9 (ref 7–16)
BUN: 6 mg/dL — ABNORMAL LOW (ref 7–18)
Co2: 19 mmol/L — ABNORMAL LOW (ref 21–32)
Creatinine: 1.17 mg/dL (ref 0.60–1.30)
EGFR (African American): >60
EGFR (Non-African Amer.): 53 — ABNORMAL LOW
Glucose: 91 mg/dL (ref 65–99)
Potassium: 2.8 mmol/L — ABNORMAL LOW (ref 3.5–5.1)

## 2012-04-12 LAB — CLOSTRIDIUM DIFFICILE BY PCR

## 2012-04-13 LAB — WBC: WBC: 10 10*3/uL (ref 3.6–11.0)

## 2012-04-13 LAB — BASIC METABOLIC PANEL
Anion Gap: 9 (ref 7–16)
BUN: 4 mg/dL — ABNORMAL LOW (ref 7–18)
Chloride: 115 mmol/L — ABNORMAL HIGH (ref 98–107)
Co2: 19 mmol/L — ABNORMAL LOW (ref 21–32)
Creatinine: 1.34 mg/dL — ABNORMAL HIGH (ref 0.60–1.30)
EGFR (Non-African Amer.): 45 — ABNORMAL LOW
Glucose: 93 mg/dL (ref 65–99)
Osmolality: 282 (ref 275–301)
Sodium: 143 mmol/L (ref 136–145)

## 2012-04-13 LAB — URINE CULTURE

## 2012-04-13 LAB — MAGNESIUM: Magnesium: 1.5 mg/dL — ABNORMAL LOW

## 2012-04-14 LAB — BASIC METABOLIC PANEL
Anion Gap: 8 (ref 7–16)
BUN: 3 mg/dL — ABNORMAL LOW (ref 7–18)
Creatinine: 1.14 mg/dL (ref 0.60–1.30)
EGFR (Non-African Amer.): 55 — ABNORMAL LOW
Osmolality: 281 (ref 275–301)
Potassium: 4.1 mmol/L (ref 3.5–5.1)
Sodium: 143 mmol/L (ref 136–145)

## 2012-04-28 ENCOUNTER — Ambulatory Visit: Payer: Self-pay | Admitting: Gastroenterology

## 2012-04-30 LAB — PATHOLOGY REPORT

## 2012-05-11 DIAGNOSIS — F329 Major depressive disorder, single episode, unspecified: Secondary | ICD-10-CM | POA: Insufficient documentation

## 2012-05-11 DIAGNOSIS — K529 Noninfective gastroenteritis and colitis, unspecified: Secondary | ICD-10-CM | POA: Insufficient documentation

## 2012-05-11 DIAGNOSIS — F339 Major depressive disorder, recurrent, unspecified: Secondary | ICD-10-CM | POA: Insufficient documentation

## 2012-05-11 DIAGNOSIS — E7800 Pure hypercholesterolemia, unspecified: Secondary | ICD-10-CM | POA: Insufficient documentation

## 2012-06-22 DIAGNOSIS — F419 Anxiety disorder, unspecified: Secondary | ICD-10-CM | POA: Insufficient documentation

## 2012-06-22 DIAGNOSIS — F418 Other specified anxiety disorders: Secondary | ICD-10-CM | POA: Insufficient documentation

## 2012-09-03 ENCOUNTER — Emergency Department: Payer: Self-pay | Admitting: Emergency Medicine

## 2012-09-03 LAB — COMPREHENSIVE METABOLIC PANEL
Albumin: 3.3 g/dL — ABNORMAL LOW (ref 3.4–5.0)
Alkaline Phosphatase: 177 U/L — ABNORMAL HIGH (ref 50–136)
Anion Gap: 8 (ref 7–16)
BUN: 21 mg/dL — ABNORMAL HIGH (ref 7–18)
Bilirubin,Total: 0.1 mg/dL — ABNORMAL LOW (ref 0.2–1.0)
Calcium, Total: 9.1 mg/dL (ref 8.5–10.1)
Chloride: 108 mmol/L — ABNORMAL HIGH (ref 98–107)
EGFR (African American): 51 — ABNORMAL LOW
Osmolality: 283 (ref 275–301)
Potassium: 3.9 mmol/L (ref 3.5–5.1)
SGOT(AST): 24 U/L (ref 15–37)
Sodium: 140 mmol/L (ref 136–145)
Total Protein: 7.8 g/dL (ref 6.4–8.2)

## 2012-09-03 LAB — URINALYSIS, COMPLETE
Bilirubin,UR: NEGATIVE
Blood: NEGATIVE
Glucose,UR: NEGATIVE mg/dL (ref 0–75)
Hyaline Cast: 13
Nitrite: NEGATIVE
Ph: 5 (ref 4.5–8.0)
Protein: NEGATIVE
Specific Gravity: 1.025 (ref 1.003–1.030)

## 2012-09-03 LAB — DRUG SCREEN, URINE
Amphetamines, Ur Screen: NEGATIVE (ref ?–1000)
Barbiturates, Ur Screen: POSITIVE (ref ?–200)
Cannabinoid 50 Ng, Ur ~~LOC~~: NEGATIVE (ref ?–50)
MDMA (Ecstasy)Ur Screen: NEGATIVE (ref ?–500)
Methadone, Ur Screen: NEGATIVE (ref ?–300)
Phencyclidine (PCP) Ur S: NEGATIVE (ref ?–25)

## 2012-09-03 LAB — ACETAMINOPHEN LEVEL
Acetaminophen: <2 ug/mL
Acetaminophen: <2 ug/mL

## 2012-09-03 LAB — CBC
HGB: 11 g/dL — ABNORMAL LOW (ref 12.0–16.0)
MCH: 28.1 pg (ref 26.0–34.0)
MCV: 84 fL (ref 80–100)
RDW: 14.7 % — ABNORMAL HIGH (ref 11.5–14.5)

## 2012-09-03 LAB — SALICYLATE LEVEL: Salicylates, Serum: 5.3 mg/dL — ABNORMAL HIGH

## 2012-11-09 ENCOUNTER — Other Ambulatory Visit: Payer: Self-pay | Admitting: Neurosurgery

## 2012-11-17 ENCOUNTER — Encounter (HOSPITAL_COMMUNITY): Payer: Self-pay | Admitting: Respiratory Therapy

## 2012-11-17 ENCOUNTER — Other Ambulatory Visit (HOSPITAL_COMMUNITY): Payer: Self-pay | Admitting: *Deleted

## 2012-11-17 NOTE — Pre-Procedure Instructions (Signed)
Whitney Bernard  11/17/2012   Your procedure is scheduled on:  November 26, 2012 at 11:30 AM   Report to [facility] Main Entrance "A" at 9:30 AM.   Call this number if you have problems the morning of surgery: [phone]   Remember:   Do not eat food or drink liquids after midnight Thursday, 11/25/12   Take these medicines the morning of surgery with A SIP OF WATER: ALPRAZolam Prudy Feeler) - if needed, Oxycodone HCl - if needed, PARoxetine (PAXIL)   Stop all Vitamins, Herbal medications, Aspirin and NSAIDS (Ibuprofen, Motrin, Aleve, Naproxen, etc) as of 11/19/12.      Do not wear jewelry, make-up or nail polish.  Do not wear lotions, powders, or perfumes. You may wear deodorant.  Do not shave 48 hours prior to surgery.   Do not bring valuables to the hospital.  [facility] is not responsible                  for any belongings or valuables.               Contacts, dentures or bridgework may not be worn into surgery.  Leave suitcase in the car. After surgery it may be brought to your room.  For patients admitted to the hospital, discharge time is determined by your                treatment team.             Special Instructions: Shower using CHG 2 nights before surgery and the night before surgery.  If you shower the day of surgery use CHG.  Use special wash - you have one bottle of CHG for all showers.  You should use approximately 1/3 of the bottle for each shower.   Please read over the following fact sheets that you were given: Pain Booklet, Coughing and Deep Breathing, MRSA Information and Surgical Site Infection Prevention

## 2012-11-18 ENCOUNTER — Encounter (HOSPITAL_COMMUNITY)
Admission: RE | Admit: 2012-11-18 | Discharge: 2012-11-18 | Disposition: A | Discharge status: Home / Self Care | Payer: 59 | Source: Ambulatory Visit | Attending: Neurosurgery | Admitting: Neurosurgery

## 2012-11-18 ENCOUNTER — Encounter (HOSPITAL_COMMUNITY): Payer: Self-pay

## 2012-11-18 DIAGNOSIS — Z01810 Encounter for preprocedural cardiovascular examination: Secondary | ICD-10-CM | POA: Insufficient documentation

## 2012-11-18 DIAGNOSIS — Z01812 Encounter for preprocedural laboratory examination: Secondary | ICD-10-CM | POA: Insufficient documentation

## 2012-11-18 DIAGNOSIS — Z01818 Encounter for other preprocedural examination: Secondary | ICD-10-CM | POA: Insufficient documentation

## 2012-11-18 HISTORY — DX: Chronic obstructive pulmonary disease, unspecified: J44.9

## 2012-11-18 HISTORY — DX: Major depressive disorder, single episode, unspecified: F32.9

## 2012-11-18 HISTORY — DX: Depression, unspecified: F32.A

## 2012-11-18 HISTORY — DX: Headache: R51

## 2012-11-18 LAB — CBC
HCT: 35.4 % — ABNORMAL LOW (ref 36.0–46.0)
Hemoglobin: 11.5 g/dL — ABNORMAL LOW (ref 12.0–15.0)
MCHC: 32.5 g/dL (ref 30.0–36.0)
MCV: 82.9 fL (ref 78.0–100.0)
Platelets: 351 10*3/uL (ref 150–400)
RDW: 15.3 % (ref 11.5–15.5)

## 2012-11-18 LAB — BASIC METABOLIC PANEL
BUN: 16 mg/dL (ref 6–23)
CO2: 28 mEq/L (ref 19–32)
Chloride: 99 mEq/L (ref 96–112)
Creatinine, Ser: 1.22 mg/dL — ABNORMAL HIGH (ref 0.50–1.10)
GFR calc Af Amer: 57 mL/min — ABNORMAL LOW (ref >90)
Glucose, Bld: 89 mg/dL (ref 70–99)
Potassium: 3.6 mEq/L (ref 3.5–5.1)

## 2012-11-18 LAB — SURGICAL PCR SCREEN: Staphylococcus aureus: POSITIVE — AB

## 2012-11-25 MED ORDER — DEXAMETHASONE SODIUM PHOSPHATE 10 MG/ML IJ SOLN
10.0000 mg | INTRAMUSCULAR | Status: AC
  Administered 2012-11-26: 10 mg via INTRAVENOUS
  Filled 2012-11-25: qty 1

## 2012-11-25 MED ORDER — CEFAZOLIN SODIUM-DEXTROSE 2-3 GM-% IV SOLR
2.0000 g | INTRAVENOUS | Status: AC
  Administered 2012-11-26: 2 g via INTRAVENOUS
  Filled 2012-11-25: qty 50

## 2012-11-26 ENCOUNTER — Ambulatory Visit (HOSPITAL_COMMUNITY): Payer: 59

## 2012-11-26 ENCOUNTER — Ambulatory Visit (HOSPITAL_COMMUNITY): Payer: 59 | Admitting: Certified Registered"

## 2012-11-26 ENCOUNTER — Encounter (HOSPITAL_COMMUNITY): Payer: 59 | Admitting: Certified Registered"

## 2012-11-26 ENCOUNTER — Ambulatory Visit (HOSPITAL_COMMUNITY)
Admission: RE | Admit: 2012-11-26 | Discharge: 2012-11-27 | Disposition: A | Discharge status: Home / Self Care | Payer: 59 | Source: Ambulatory Visit | Attending: Neurosurgery | Admitting: Neurosurgery

## 2012-11-26 ENCOUNTER — Encounter (HOSPITAL_COMMUNITY)
Admission: RE | Disposition: A | Discharge status: Home / Self Care | Payer: Self-pay | Source: Ambulatory Visit | Attending: Neurosurgery

## 2012-11-26 ENCOUNTER — Encounter (HOSPITAL_COMMUNITY): Payer: Self-pay | Admitting: Anesthesiology

## 2012-11-26 DIAGNOSIS — Z23 Encounter for immunization: Secondary | ICD-10-CM | POA: Insufficient documentation

## 2012-11-26 DIAGNOSIS — M47812 Spondylosis without myelopathy or radiculopathy, cervical region: Secondary | ICD-10-CM | POA: Insufficient documentation

## 2012-11-26 DIAGNOSIS — J449 Chronic obstructive pulmonary disease, unspecified: Secondary | ICD-10-CM | POA: Insufficient documentation

## 2012-11-26 DIAGNOSIS — I10 Essential (primary) hypertension: Secondary | ICD-10-CM | POA: Insufficient documentation

## 2012-11-26 DIAGNOSIS — J4489 Other specified chronic obstructive pulmonary disease: Secondary | ICD-10-CM | POA: Insufficient documentation

## 2012-11-26 DIAGNOSIS — Z79899 Other long term (current) drug therapy: Secondary | ICD-10-CM | POA: Insufficient documentation

## 2012-11-26 HISTORY — PX: ANTERIOR CERVICAL DECOMP/DISCECTOMY FUSION: SHX1161

## 2012-11-26 SURGERY — ANTERIOR CERVICAL DECOMPRESSION/DISCECTOMY FUSION 2 LEVELS
Anesthesia: General | Site: Neck | Wound class: Clean

## 2012-11-26 MED ORDER — 0.9 % SODIUM CHLORIDE (POUR BTL) OPTIME
TOPICAL | Status: DC | PRN
  Administered 2012-11-26: 1000 mL

## 2012-11-26 MED ORDER — LIDOCAINE HCL (CARDIAC) 20 MG/ML IV SOLN
INTRAVENOUS | Status: DC | PRN
  Administered 2012-11-26: 100 mg via INTRAVENOUS

## 2012-11-26 MED ORDER — POTASSIUM CHLORIDE CRYS ER 20 MEQ PO TBCR
20.0000 meq | EXTENDED_RELEASE_TABLET | Freq: Three times a day (TID) | ORAL | Status: DC
  Administered 2012-11-26: 20 meq via ORAL
  Filled 2012-11-26 (×5): qty 1

## 2012-11-26 MED ORDER — PHENOL 1.4 % MT LIQD: 1.0000 | OROMUCOSAL | Status: DC | PRN

## 2012-11-26 MED ORDER — ONDANSETRON HCL 4 MG/2ML IJ SOLN
INTRAMUSCULAR | Status: DC | PRN
  Administered 2012-11-26: 4 mg via INTRAMUSCULAR

## 2012-11-26 MED ORDER — ONDANSETRON HCL 4 MG/2ML IJ SOLN: 4.0000 mg | INTRAMUSCULAR | Status: DC | PRN

## 2012-11-26 MED ORDER — HEMOSTATIC AGENTS (NO CHARGE) OPTIME
TOPICAL | Status: DC | PRN
  Administered 2012-11-26: 1 via TOPICAL

## 2012-11-26 MED ORDER — THROMBIN 5000 UNITS EX SOLR
CUTANEOUS | Status: DC | PRN
  Administered 2012-11-26 (×2): 5000 [IU] via TOPICAL

## 2012-11-26 MED ORDER — NEOSTIGMINE METHYLSULFATE 1 MG/ML IJ SOLN
INTRAMUSCULAR | Status: DC | PRN
  Administered 2012-11-26: 4 mg via INTRAVENOUS

## 2012-11-26 MED ORDER — PANTOPRAZOLE SODIUM 40 MG IV SOLR
40.0000 mg | Freq: Every day | INTRAVENOUS | Status: DC
  Administered 2012-11-26: 40 mg via INTRAVENOUS
  Filled 2012-11-26 (×2): qty 40

## 2012-11-26 MED ORDER — HYDROMORPHONE HCL PF 1 MG/ML IJ SOLN
0.2500 mg | INTRAMUSCULAR | Status: DC | PRN
  Administered 2012-11-26 (×2): 0.5 mg via INTRAVENOUS

## 2012-11-26 MED ORDER — ROCURONIUM BROMIDE 100 MG/10ML IV SOLN
INTRAVENOUS | Status: DC | PRN
  Administered 2012-11-26: 50 mg via INTRAVENOUS

## 2012-11-26 MED ORDER — MENTHOL 3 MG MT LOZG
1.0000 | LOZENGE | OROMUCOSAL | Status: DC | PRN
  Filled 2012-11-26: qty 9

## 2012-11-26 MED ORDER — LACTATED RINGERS IV SOLN
INTRAVENOUS | Status: DC
  Administered 2012-11-26 (×2): via INTRAVENOUS

## 2012-11-26 MED ORDER — PAROXETINE HCL 20 MG PO TABS
40.0000 mg | ORAL_TABLET | Freq: Every day | ORAL | Status: DC
  Administered 2012-11-27: 40 mg via ORAL
  Filled 2012-11-26: qty 2

## 2012-11-26 MED ORDER — VECURONIUM BROMIDE 10 MG IV SOLR
INTRAVENOUS | Status: DC | PRN
  Administered 2012-11-26: 1 mg via INTRAVENOUS
  Administered 2012-11-26: 3 mg via INTRAVENOUS
  Administered 2012-11-26: 1 mg via INTRAVENOUS
  Administered 2012-11-26: 2 mg via INTRAVENOUS

## 2012-11-26 MED ORDER — GLYCOPYRROLATE 0.2 MG/ML IJ SOLN
INTRAMUSCULAR | Status: DC | PRN
  Administered 2012-11-26: 0.6 mg via INTRAVENOUS

## 2012-11-26 MED ORDER — MIDAZOLAM HCL 5 MG/5ML IJ SOLN
INTRAMUSCULAR | Status: DC | PRN
  Administered 2012-11-26: 2 mg via INTRAVENOUS

## 2012-11-26 MED ORDER — PROPOFOL 10 MG/ML IV BOLUS
INTRAVENOUS | Status: DC | PRN
  Administered 2012-11-26: 200 mg via INTRAVENOUS

## 2012-11-26 MED ORDER — FUROSEMIDE 40 MG PO TABS
40.0000 mg | ORAL_TABLET | Freq: Every day | ORAL | Status: DC
  Filled 2012-11-26 (×2): qty 1

## 2012-11-26 MED ORDER — CYCLOBENZAPRINE HCL 10 MG PO TABS
ORAL_TABLET | ORAL | Status: AC
  Filled 2012-11-26: qty 1

## 2012-11-26 MED ORDER — MUPIROCIN 2 % EX OINT
TOPICAL_OINTMENT | Freq: Two times a day (BID) | CUTANEOUS | Status: DC
  Administered 2012-11-26 – 2012-11-27 (×2): via NASAL

## 2012-11-26 MED ORDER — DEXAMETHASONE SODIUM PHOSPHATE 4 MG/ML IJ SOLN
4.0000 mg | Freq: Four times a day (QID) | INTRAMUSCULAR | Status: AC
  Administered 2012-11-26: 4 mg via INTRAVENOUS
  Filled 2012-11-26: qty 1

## 2012-11-26 MED ORDER — HYDROMORPHONE HCL PF 1 MG/ML IJ SOLN
1.0000 mg | INTRAMUSCULAR | Status: DC | PRN
  Administered 2012-11-26 – 2012-11-27 (×3): 1.5 mg via INTRAMUSCULAR
  Administered 2012-11-27: 1 mg via INTRAMUSCULAR
  Administered 2012-11-27: 1.5 mg via INTRAMUSCULAR
  Filled 2012-11-26 (×2): qty 2
  Filled 2012-11-26: qty 1
  Filled 2012-11-26 (×2): qty 2

## 2012-11-26 MED ORDER — DEXAMETHASONE 4 MG PO TABS
4.0000 mg | ORAL_TABLET | Freq: Four times a day (QID) | ORAL | Status: AC
  Administered 2012-11-26: 4 mg via ORAL
  Filled 2012-11-26: qty 1

## 2012-11-26 MED ORDER — SODIUM CHLORIDE 0.9 % IJ SOLN: 3.0000 mL | INTRAMUSCULAR | Status: DC | PRN

## 2012-11-26 MED ORDER — ZOLPIDEM TARTRATE 5 MG PO TABS: 5.0000 mg | ORAL_TABLET | Freq: Every evening | ORAL | Status: DC | PRN

## 2012-11-26 MED ORDER — OXYCODONE HCL 5 MG PO TABS
ORAL_TABLET | ORAL | Status: AC
  Filled 2012-11-26: qty 2

## 2012-11-26 MED ORDER — METOCLOPRAMIDE HCL 5 MG/ML IJ SOLN: 10.0000 mg | Freq: Once | INTRAMUSCULAR | Status: DC | PRN

## 2012-11-26 MED ORDER — CEFAZOLIN SODIUM-DEXTROSE 2-3 GM-% IV SOLR
2.0000 g | Freq: Three times a day (TID) | INTRAVENOUS | Status: AC
  Administered 2012-11-26 – 2012-11-27 (×2): 2 g via INTRAVENOUS
  Filled 2012-11-26 (×2): qty 50

## 2012-11-26 MED ORDER — OXYCODONE HCL 5 MG PO TABS: 5.0000 mg | ORAL_TABLET | Freq: Once | ORAL | Status: DC | PRN

## 2012-11-26 MED ORDER — KCL IN DEXTROSE-NACL 20-5-0.45 MEQ/L-%-% IV SOLN
80.0000 mL/h | INTRAVENOUS | Status: DC
  Administered 2012-11-26: 80 mL/h via INTRAVENOUS
  Filled 2012-11-26 (×3): qty 1000

## 2012-11-26 MED ORDER — CYCLOBENZAPRINE HCL 10 MG PO TABS
10.0000 mg | ORAL_TABLET | Freq: Three times a day (TID) | ORAL | Status: DC | PRN
  Administered 2012-11-26 – 2012-11-27 (×3): 10 mg via ORAL
  Filled 2012-11-26 (×2): qty 1

## 2012-11-26 MED ORDER — SODIUM CHLORIDE 0.9 % IR SOLN
Status: DC | PRN
  Administered 2012-11-26: 13:00:00

## 2012-11-26 MED ORDER — ALPRAZOLAM 0.5 MG PO TABS
0.5000 mg | ORAL_TABLET | Freq: Two times a day (BID) | ORAL | Status: DC
  Administered 2012-11-26 – 2012-11-27 (×2): 0.5 mg via ORAL
  Filled 2012-11-26 (×2): qty 1

## 2012-11-26 MED ORDER — OXYCODONE HCL 5 MG PO TABS
10.0000 mg | ORAL_TABLET | ORAL | Status: DC | PRN
  Administered 2012-11-26 – 2012-11-27 (×5): 10 mg via ORAL
  Filled 2012-11-26 (×4): qty 2

## 2012-11-26 MED ORDER — ACETAMINOPHEN 325 MG PO TABS: 650.0000 mg | ORAL_TABLET | ORAL | Status: DC | PRN

## 2012-11-26 MED ORDER — ARTIFICIAL TEARS OP OINT
TOPICAL_OINTMENT | OPHTHALMIC | Status: DC | PRN
  Administered 2012-11-26: 1 via OPHTHALMIC

## 2012-11-26 MED ORDER — SODIUM CHLORIDE 0.9 % IJ SOLN
3.0000 mL | Freq: Two times a day (BID) | INTRAMUSCULAR | Status: DC
  Administered 2012-11-26 (×2): 3 mL via INTRAVENOUS

## 2012-11-26 MED ORDER — OXYCODONE HCL 5 MG/5ML PO SOLN: 5.0000 mg | Freq: Once | ORAL | Status: DC | PRN

## 2012-11-26 MED ORDER — LACTATED RINGERS IV SOLN: INTRAVENOUS | Status: DC

## 2012-11-26 MED ORDER — HYDROMORPHONE HCL PF 1 MG/ML IJ SOLN
INTRAMUSCULAR | Status: AC
  Filled 2012-11-26: qty 1

## 2012-11-26 MED ORDER — FENTANYL CITRATE 0.05 MG/ML IJ SOLN
INTRAMUSCULAR | Status: DC | PRN
  Administered 2012-11-26 (×2): 50 ug via INTRAVENOUS
  Administered 2012-11-26: 100 ug via INTRAVENOUS
  Administered 2012-11-26: 50 ug via INTRAVENOUS
  Administered 2012-11-26: 100 ug via INTRAVENOUS
  Administered 2012-11-26: 50 ug via INTRAVENOUS

## 2012-11-26 MED ORDER — ACETAMINOPHEN 650 MG RE SUPP: 650.0000 mg | RECTAL | Status: DC | PRN

## 2012-11-26 SURGICAL SUPPLY — 56 items
APL SKNCLS STERI-STRIP NONHPOA (GAUZE/BANDAGES/DRESSINGS) ×1
BAG DECANTER FOR FLEXI CONT (MISCELLANEOUS) ×2 IMPLANT
BENZOIN TINCTURE PRP APPL 2/3 (GAUZE/BANDAGES/DRESSINGS) ×4 IMPLANT
BIT DRILL TRINICA 2.3MM (BIT) IMPLANT
BRUSH SCRUB EZ PLAIN DRY (MISCELLANEOUS) ×2 IMPLANT
BUR MATCHSTICK NEURO 3.0 LAGG (BURR) ×2 IMPLANT
CANISTER SUCTION 2500CC (MISCELLANEOUS) ×2 IMPLANT
CLOTH BEACON ORANGE TIMEOUT ST (SAFETY) ×2 IMPLANT
CONT SPEC 4OZ CLIKSEAL STRL BL (MISCELLANEOUS) ×2 IMPLANT
DRAPE C-ARM 42X72 X-RAY (DRAPES) ×4 IMPLANT
DRAPE LAPAROTOMY 100X72 PEDS (DRAPES) ×2 IMPLANT
DRAPE MICROSCOPE ZEISS OPMI (DRAPES) ×2 IMPLANT
DRAPE SURG 17X23 STRL (DRAPES) ×4 IMPLANT
DRESSING TELFA 8X3 (GAUZE/BANDAGES/DRESSINGS) ×2 IMPLANT
DRILL BIT TRINICA 2.3MM (BIT) ×2
DRSG OPSITE POSTOP 4X6 (GAUZE/BANDAGES/DRESSINGS) ×1 IMPLANT
DURAPREP 6ML APPLICATOR 50/CS (WOUND CARE) ×2 IMPLANT
ELECT COATED BLADE 2.86 ST (ELECTRODE) ×2 IMPLANT
ELECT REM PT RETURN 9FT ADLT (ELECTROSURGICAL) ×2
ELECTRODE REM PT RTRN 9FT ADLT (ELECTROSURGICAL) ×1 IMPLANT
GAUZE SPONGE 4X4 16PLY XRAY LF (GAUZE/BANDAGES/DRESSINGS) IMPLANT
GLOVE ECLIPSE 8.0 STRL XLNG CF (GLOVE) ×2 IMPLANT
GLOVE ECLIPSE 8.5 STRL (GLOVE) ×1 IMPLANT
GLOVE EXAM NITRILE LRG STRL (GLOVE) IMPLANT
GLOVE EXAM NITRILE MD LF STRL (GLOVE) ×2 IMPLANT
GLOVE EXAM NITRILE XL STR (GLOVE) IMPLANT
GLOVE EXAM NITRILE XS STR PU (GLOVE) IMPLANT
GOWN BRE IMP SLV AUR LG STRL (GOWN DISPOSABLE) IMPLANT
GOWN BRE IMP SLV AUR XL STRL (GOWN DISPOSABLE) IMPLANT
GOWN STRL REIN 2XL LVL4 (GOWN DISPOSABLE) ×2 IMPLANT
HEAD HALTER (SOFTGOODS) ×2 IMPLANT
KIT BASIN OR (CUSTOM PROCEDURE TRAY) ×2 IMPLANT
KIT ROOM TURNOVER OR (KITS) ×2 IMPLANT
NDL SPNL 20GX3.5 QUINCKE YW (NEEDLE) ×1 IMPLANT
NEEDLE SPNL 20GX3.5 QUINCKE YW (NEEDLE) ×2 IMPLANT
NS IRRIG 1000ML POUR BTL (IV SOLUTION) ×2 IMPLANT
PACK LAMINECTOMY NEURO (CUSTOM PROCEDURE TRAY) ×2 IMPLANT
PAD ARMBOARD 7.5X6 YLW CONV (MISCELLANEOUS) ×2 IMPLANT
PATTIES SURGICAL .75X.75 (GAUZE/BANDAGES/DRESSINGS) ×2 IMPLANT
PLATE 36MM (Plate) ×1 IMPLANT
PUTTY BONE GRAFT KIT 2.5ML (Bone Implant) ×1 IMPLANT
RUBBERBAND STERILE (MISCELLANEOUS) ×4 IMPLANT
SCREW SD FIXED 12MM (Screw) ×6 IMPLANT
SPACER TMS 11X14X6MM (Spacer) ×2 IMPLANT
SPONGE GAUZE 4X4 12PLY (GAUZE/BANDAGES/DRESSINGS) ×2 IMPLANT
SPONGE INTESTINAL PEANUT (DISPOSABLE) ×2 IMPLANT
SPONGE SURGIFOAM ABS GEL SZ50 (HEMOSTASIS) ×2 IMPLANT
STRIP CLOSURE SKIN 1/2X4 (GAUZE/BANDAGES/DRESSINGS) ×2 IMPLANT
SUT PDS AB 5-0 P3 18 (SUTURE) ×2 IMPLANT
SUT VIC AB 3-0 CP2 18 (SUTURE) ×2 IMPLANT
SYR 20ML ECCENTRIC (SYRINGE) IMPLANT
TAPE STRIPS DRAPE STRL (GAUZE/BANDAGES/DRESSINGS) ×1 IMPLANT
TOWEL OR 17X24 6PK STRL BLUE (TOWEL DISPOSABLE) ×2 IMPLANT
TOWEL OR 17X26 10 PK STRL BLUE (TOWEL DISPOSABLE) ×2 IMPLANT
TRAP SPECIMEN MUCOUS 40CC (MISCELLANEOUS) IMPLANT
WATER STERILE IRR 1000ML POUR (IV SOLUTION) ×2 IMPLANT

## 2012-11-26 NOTE — Op Note (Signed)
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 Localizing fluoroscopy was used prior to incision to identify the appropriate level. Transverse incision was made in the right anterior neck started the midline and headed towards the medial aspect the sternocleidomastoid muscle. The platysma muscle was then incised transversely and the natural fascial plane between the strap muscles medially and the sternocleidomastoid laterally was identified and followed down to the anterior aspect the cervical spine. The longus coli muscles were identified and split in the midline and stripped away bilaterally with unipolar coagulation and Kitner dissection. Self-retaining tract was placed for exposure and x-ray showed approach the appropriate levels. Using a 15 blade the Sutton disc at C4-5 and C5-6 were incised. Using pituitary rongeurs and curettes approximately 90% of the disc material both levels was removed. High-speed drill was used to widen the interspace and bony shavings were saved for use later in the case. At this time the microscope was draped brought in the field and used for the remainder of the case. Starting C4-5 the remainder of the disc material was removed down the posterior longitudinal ligament. Ligament was then incised transversely and the cut edges removed a Kerrison punch. Thorough decompression was carried out with a bony overgrowth be removed to decompress the spinal dura and foramen bilaterally. Similar decompression was then carried out at C5-6 once again generously decompressing the spinal dura and foramen bilaterally with the bony overgrowth be  removed to visualize the underlying C6 nerve roots at this level. At this time inspection was carried out all directions any evidence of residual compression and none could be identified. Irrigation was carried out and any bleeding control proper coagulation Gelfoam. Measurements were taken and to 6 mm.graft were chosen. Therefore mixture of autologous bone morselized allograft and impacted the space without difficulty. Fossae showed good position of the plugs. An appropriate length plate was then chosen. Under fluoroscopic guidance drill holes were placed followed by placing of 12 mm screws x6. Locking mechanism was rotated locked position and fossae showed good position of the plates screws and plugs. Irrigation was carried out and any bleeding control proper coagulation. The was then closed with inverted Vicryl on the platysma muscle inverted 5-0 PDS in the subcuticular and Steri-Strips were placed on the skin. A sterile dressing was then applied the patient was extubated and taken to recovery room in stable condition.

## 2012-11-26 NOTE — Plan of Care (Signed)
Problem: Consults Goal: Diagnosis - Spinal Surgery Outcome: Completed/Met Date Met:  11/26/12 Cervical Spine Fusion     

## 2012-11-26 NOTE — Anesthesia Preprocedure Evaluation (Addendum)
Anesthesia Evaluation  Patient identified by MRN, date of birth, ID band Patient awake    Reviewed: Allergy & Precautions, H&P , NPO status , Patient's Chart, lab work & pertinent test results, reviewed documented beta blocker date and time   Airway Mallampati: II TM Distance: >3 FB Neck ROM: full    Dental  (+) Teeth Intact and Dental Advisory Given   Pulmonary COPDformer smoker,  breath sounds clear to auscultation        Cardiovascular hypertension, Pt. on medications Rhythm:regular Rate:Normal     Neuro/Psych  Headaches, PSYCHIATRIC DISORDERS Depression  Neuromuscular disease    GI/Hepatic negative GI ROS, Neg liver ROS,   Endo/Other  negative endocrine ROS  Renal/GU negative Renal ROS  negative genitourinary   Musculoskeletal  (+) Arthritis -,   Abdominal   Peds  Hematology negative hematology ROS (+)   Anesthesia Other Findings See surgeon's H&P   Reproductive/Obstetrics negative OB ROS                          Anesthesia Physical Anesthesia Plan  ASA: II  Anesthesia Plan: General   Post-op Pain Management:    Induction: Intravenous  Airway Management Planned: Oral ETT  Additional Equipment:   Intra-op Plan:   Post-operative Plan: Extubation in OR  Informed Consent: I have reviewed the patients History and Physical, chart, labs and discussed the procedure including the risks, benefits and alternatives for the proposed anesthesia with the patient or authorized representative who has indicated his/her understanding and acceptance.   Dental Advisory Given  Plan Discussed with: CRNA and Surgeon  Anesthesia Plan Comments:         Anesthesia Quick Evaluation

## 2012-11-26 NOTE — Transfer of Care (Signed)
Immediate Anesthesia Transfer of Care Note  Patient: HABIBA TRELOAR  Procedure(s) Performed: Procedure(s) with comments: ANTERIOR CERVICAL DECOMPRESSION/DISCECTOMY FUSION 2 LEVELS (N/A) - ANTERIOR CERVICAL DECOMPRESSION/DISCECTOMY FUSION 2 LEVELS  Patient Location: PACU  Anesthesia Type:General  Level of Consciousness: awake and oriented  Airway & Oxygen Therapy: Patient Spontanous Breathing and Patient connected to nasal cannula oxygen  Post-op Assessment: Report given to PACU RN and Patient moving all extremities X 4  Post vital signs: Reviewed and stable  Complications: No apparent anesthesia complications

## 2012-11-26 NOTE — H&P (Signed)
Whitney Bernard is an 52 y.o. female.   Chief Complaint: Neck pain with arm numbness HPI: The patient is a 52 year old female who is an old patient who has had previous back surgery C6-7 2002. She did well at that time and now comes complaint neck pain with tingling and numbness in her arms of one month's duration. Whitney Bernard of some pain her hands. Some of the varus muscle excellent aorta significant improvement. She doesn't have any imaging studies. After evaluation the office the patient was underwent MRI scanning of the neck. She then took prednisone stenotic give her any improvement. An MRI scan was reviewed which showed mild disease at C3 for a significant disease at C4-5 and C5-6 with foraminal stenosis bilaterally. After discussing the options the patient requested surgery and now comes for a C4-5 and C5-6 anterior cervical discectomy fusion plating. I had a long discussion with her regarding the risks and benefits of surgical intervention. The risks discussed include but are not limited to bleeding infection weakness and possible fluid leak coma quadriplegia hoarseness and death. We have discussed alternative methods of therapy offered risks and benefits of nonintervention. Whitney Bernard has had the opportunity to ask numerous questions and appears to understand. With this information in hand she has requested we proceed with surgery.  Past Medical History  Diagnosis Date  . Hypertension   . Hypercholesteremia   . RA (rheumatoid arthritis)   . Depression   . COPD (chronic obstructive pulmonary disease)   . Headache(784.0)     migraines    Past Surgical History  Procedure Laterality Date  . Fracture surgery    . Cholecystectomy    . Cervical polypectomy    . Tubal ligation    . Neck surgery    . Wisdom tooth extraction      No family history on file. Social History:  reports that she quit smoking 12 days ago. Her smoking use included Cigarettes. She has a 19 pack-year smoking history. She has  never used smokeless tobacco. She reports that she drinks alcohol. She reports that she does not use illicit drugs.  Allergies:  Allergies  Allergen Reactions  . Almond Oil Hives    Medications Prior to Admission  Medication Sig Dispense Refill  . ALPRAZolam (XANAX) 0.5 MG tablet Take 0.5 mg by mouth 2 (two) times daily. Panic attacks      . butalbital-acetaminophen-caffeine (FIORICET, ESGIC) 50-325-40 MG per tablet Take 1 tablet by mouth as needed. Headaches      . furosemide (LASIX) 40 MG tablet Take 40 mg by mouth daily.      . Oxycodone HCl 10 MG TABS Take 10 mg by mouth every 4 (four) hours as needed (for pain).      Marland Kitchen PARoxetine (PAXIL) 40 MG tablet Take 40 mg by mouth daily.      . potassium chloride SA (K-DUR,KLOR-CON) 20 MEQ tablet Take 20 mEq by mouth 3 (three) times daily.         No results found for this or any previous visit (from the past 48 hour(s)). No results found.  Review of systems positive for nasal congestion sinus problems a history of colitis psoriasis clinician arms anxiety bipolar disorder and food allergies  There were no vitals taken for this visit.  The patient is awake or and oriented. His no facial asymmetry. Gait is nonantalgic. Requested 1-2+ and equal. Strength is diffusely weak is a question of giveaway. Sensation is intact. Assessment/Plan Impression is that of spondylosis with  foraminal stenosis C4-5 C5-6. The plan is for a two-level anterior cervical discectomy fusion plating.  Reinaldo Meeker, MD 11/26/2012, 11:09 AM

## 2012-11-26 NOTE — Anesthesia Procedure Notes (Signed)
Procedure Name: Intubation Date/Time: 11/26/2012 11:57 AM Performed by: De Nurse Pre-anesthesia Checklist: Patient identified, Emergency Drugs available, Suction available, Patient being monitored and Timeout performed Patient Re-evaluated:Patient Re-evaluated prior to inductionOxygen Delivery Method: Circle system utilized Preoxygenation: Pre-oxygenation with 100% oxygen Intubation Type: IV induction Ventilation: Mask ventilation without difficulty and Oral airway inserted - appropriate to patient size Laryngoscope Size: Mac and 4 Grade View: Grade I Tube type: Oral Tube size: 7.0 mm Number of attempts: 1 Airway Equipment and Method: Stylet Placement Confirmation: ETT inserted through vocal cords under direct vision,  positive ETCO2 and breath sounds checked- equal and bilateral Secured at: 22 cm Tube secured with: Tape Dental Injury: Teeth and Oropharynx as per pre-operative assessment

## 2012-11-26 NOTE — Preoperative (Signed)
Beta Blockers   Reason not to administer Beta Blockers:Not Applicable 

## 2012-11-26 NOTE — Progress Notes (Signed)
Orthopedic Tech Progress Note Patient Details:  Whitney Bernard 1960-09-16 [id number]  Ortho Devices Type of Ortho Device: Soft collar Ortho Device/Splint Interventions: Application   Shawnie Pons 11/26/2012, 3:58 PM

## 2012-11-26 NOTE — Anesthesia Postprocedure Evaluation (Signed)
Anesthesia Post Note  Patient: Whitney Bernard  Procedure(s) Performed: Procedure(s) (LRB): ANTERIOR CERVICAL DECOMPRESSION/DISCECTOMY FUSION 2 LEVELS (N/A)  Anesthesia type: General  Patient location: PACU  Post pain: Pain level controlled  Post assessment: Patient's Cardiovascular Status Stable  Last Vitals:  Filed Vitals:   11/26/12 1449  BP:   Pulse:   Temp: 37.1 C  Resp:     Post vital signs: Reviewed and stable  Level of consciousness: alert  Complications: No apparent anesthesia complications

## 2012-11-27 MED ORDER — CHLORHEXIDINE GLUCONATE CLOTH 2 % EX PADS
6.0000 | MEDICATED_PAD | Freq: Every day | CUTANEOUS | Status: DC
  Administered 2012-11-27: 6 via TOPICAL

## 2012-11-27 MED ORDER — CYCLOBENZAPRINE HCL 10 MG PO TABS: 10.0000 mg | ORAL_TABLET | Freq: Three times a day (TID) | ORAL | Status: DC | PRN

## 2012-11-27 MED ORDER — OXYCODONE HCL 10 MG PO TABS: 10.0000 mg | ORAL_TABLET | ORAL | Status: DC | PRN

## 2012-11-27 MED ORDER — INFLUENZA VAC SPLIT QUAD 0.5 ML IM SUSP
0.5000 mL | INTRAMUSCULAR | Status: AC
  Administered 2012-11-27: 0.5 mL via INTRAMUSCULAR
  Filled 2012-11-27: qty 0.5

## 2012-11-27 MED ORDER — PANTOPRAZOLE SODIUM 40 MG PO TBEC
40.0000 mg | DELAYED_RELEASE_TABLET | Freq: Every day | ORAL | Status: DC
  Administered 2012-11-27: 40 mg via ORAL
  Filled 2012-11-27: qty 1

## 2012-11-27 NOTE — Progress Notes (Signed)
Pt. discharged home accompanied by husband. Prescriptions and discharge instructions given with verbalization of understanding. Incision site on neck with no s/s of infection - no swelling, redness, bleeding, and/or drainage noted. Soft collar intact. Pain med given just before leaving. Opportunity given to ask questions but no question asked. Pt. transported out of this unit in wheelchair by the volunteer. 

## 2012-11-27 NOTE — Discharge Summary (Signed)
Physician Discharge Summary  Patient ID: Whitney Bernard MRN: [id number] DOB/AGE: [birth year] 52 y.o.  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 *   Discharged Condition: good  Hospital Course: 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 C4-5 C5-6 decompressive anterior cervical discectomy with trabecular metal interbody fusion and Trinica anterior cervical plating Post op she is speaking well with a strong voice Moving all extremities well Wound is clean dry and without signs of infection Ambulating, and voiding well  Consults: None  Significant Diagnostic Studies: none  Treatments: surgery: C4-5 C5-6 decompressive anterior cervical discectomy with trabecular metal interbody fusion and Trinica anterior cervical plating   Discharge Exam: Blood pressure 138/87, pulse 74, temperature 98.8 F (37.1 C), temperature source Oral, resp. rate 18, SpO2 92.00%. General appearance: alert, cooperative, appears stated age and no distress Neurologic: Alert and oriented X 3, normal strength and tone. Normal symmetric reflexes. Normal coordination and gait  Disposition: 01-Home or Self Care     Medication List         ALPRAZolam 0.5 MG tablet  Commonly known as:  XANAX  Take 0.5 mg by mouth 2 (two) times daily. Panic attacks     butalbital-acetaminophen-caffeine 50-325-40 MG per tablet  Commonly known as:  FIORICET, ESGIC  Take 1 tablet by mouth as needed. Headaches     cyclobenzaprine 10 MG tablet  Commonly known as:  FLEXERIL  Take 1 tablet (10 mg total) by mouth 3 (three) times daily as needed for muscle spasms.     furosemide 40 MG tablet  Commonly known as:  LASIX  Take 40 mg by mouth daily.      Oxycodone HCl 10 MG Tabs  Take 1 tablet (10 mg total) by mouth every 4 (four) hours as needed (for pain).     PARoxetine 40 MG tablet  Commonly known as:  PAXIL  Take 40 mg by mouth daily.     potassium chloride SA 20 MEQ tablet  Commonly known as:  K-DUR,KLOR-CON  Take 20 mEq by mouth 3 (three) times daily.           Follow-up Information   Follow up with Reinaldo Meeker, MD In 2 weeks. (call to make appointment)    Specialty:  Neurosurgery   Contact information:   [address] [place] [address] [phone]       Signed: Krrish Freund L 11/27/2012, 10:18 AM

## 2012-12-01 ENCOUNTER — Encounter (HOSPITAL_COMMUNITY): Payer: Self-pay | Admitting: Neurosurgery

## 2012-12-31 ENCOUNTER — Emergency Department: Payer: Self-pay | Admitting: Emergency Medicine

## 2012-12-31 LAB — URINALYSIS, COMPLETE
Bilirubin,UR: NEGATIVE
Blood: NEGATIVE
Glucose,UR: NEGATIVE mg/dL (ref 0–75)
Hyaline Cast: 7
Ketone: NEGATIVE
Leukocyte Esterase: NEGATIVE
Ph: 5 (ref 4.5–8.0)
Protein: 30
Specific Gravity: 1.031 (ref 1.003–1.030)
Squamous Epithelial: 1
WBC UR: NONE SEEN /HPF (ref 0–5)

## 2012-12-31 LAB — DRUG SCREEN, URINE
Amphetamines, Ur Screen: NEGATIVE (ref ?–1000)
Benzodiazepine, Ur Scrn: POSITIVE (ref ?–200)
Cannabinoid 50 Ng, Ur ~~LOC~~: NEGATIVE (ref ?–50)
Cocaine Metabolite,Ur ~~LOC~~: NEGATIVE (ref ?–300)
MDMA (Ecstasy)Ur Screen: NEGATIVE (ref ?–500)
Methadone, Ur Screen: NEGATIVE (ref ?–300)
Opiate, Ur Screen: POSITIVE (ref ?–300)
Tricyclic, Ur Screen: POSITIVE (ref ?–1000)

## 2012-12-31 LAB — CBC
HCT: 34.3 % — ABNORMAL LOW (ref 35.0–47.0)
HGB: 11.5 g/dL — ABNORMAL LOW (ref 12.0–16.0)
MCV: 78 fL — ABNORMAL LOW (ref 80–100)
Platelet: 370 10*3/uL (ref 150–440)
RDW: 17.1 % — ABNORMAL HIGH (ref 11.5–14.5)

## 2012-12-31 LAB — SALICYLATE LEVEL: Salicylates, Serum: <1.7 mg/dL

## 2012-12-31 LAB — COMPREHENSIVE METABOLIC PANEL
Albumin: 3.4 g/dL (ref 3.4–5.0)
Bilirubin,Total: 0.2 mg/dL (ref 0.2–1.0)
Chloride: 110 mmol/L — ABNORMAL HIGH (ref 98–107)
Co2: 23 mmol/L (ref 21–32)
EGFR (Non-African Amer.): 53 — ABNORMAL LOW
Glucose: 98 mg/dL (ref 65–99)
Potassium: 4.1 mmol/L (ref 3.5–5.1)
SGOT(AST): 24 U/L (ref 15–37)
SGPT (ALT): 24 U/L (ref 12–78)
Sodium: 138 mmol/L (ref 136–145)
Total Protein: 7.2 g/dL (ref 6.4–8.2)

## 2012-12-31 LAB — ACETAMINOPHEN LEVEL: Acetaminophen: <2 ug/mL

## 2012-12-31 LAB — ETHANOL
Ethanol %: <0.003 % (ref 0.000–0.080)
Ethanol: <3 mg/dL

## 2012-12-31 LAB — TSH: Thyroid Stimulating Horm: 0.949 u[IU]/mL

## 2013-01-03 ENCOUNTER — Emergency Department: Payer: Self-pay | Admitting: Emergency Medicine

## 2013-01-09 ENCOUNTER — Ambulatory Visit: Payer: Self-pay | Admitting: Family

## 2013-02-23 DIAGNOSIS — I10 Essential (primary) hypertension: Secondary | ICD-10-CM | POA: Insufficient documentation

## 2013-02-23 DIAGNOSIS — F1990 Other psychoactive substance use, unspecified, uncomplicated: Secondary | ICD-10-CM | POA: Insufficient documentation

## 2013-02-23 DIAGNOSIS — F1910 Other psychoactive substance abuse, uncomplicated: Secondary | ICD-10-CM

## 2013-05-11 ENCOUNTER — Emergency Department: Payer: Self-pay | Admitting: Emergency Medicine

## 2013-05-11 LAB — CBC WITH DIFFERENTIAL/PLATELET
BASOS ABS: 0.1 10*3/uL (ref 0.0–0.1)
Basophil %: 0.5 %
EOS ABS: 0 10*3/uL (ref 0.0–0.7)
Eosinophil %: 0.3 %
HCT: 41.3 % (ref 35.0–47.0)
HGB: 13 g/dL (ref 12.0–16.0)
LYMPHS ABS: 1.4 10*3/uL (ref 1.0–3.6)
Lymphocyte %: 10.3 %
MCH: 25.5 pg — ABNORMAL LOW (ref 26.0–34.0)
MCHC: 31.5 g/dL — ABNORMAL LOW (ref 32.0–36.0)
MCV: 81 fL (ref 80–100)
Monocyte #: 0.6 x10 3/mm (ref 0.2–0.9)
Monocyte %: 4.4 %
NEUTROS ABS: 11.7 10*3/uL — AB (ref 1.4–6.5)
NEUTROS PCT: 84.5 %
Platelet: 374 10*3/uL (ref 150–440)
RBC: 5.1 10*6/uL (ref 3.80–5.20)
RDW: 16.2 % — ABNORMAL HIGH (ref 11.5–14.5)
WBC: 13.9 10*3/uL — ABNORMAL HIGH (ref 3.6–11.0)

## 2013-05-11 LAB — URINALYSIS, COMPLETE
Bacteria: NONE SEEN
Bilirubin,UR: NEGATIVE
Blood: NEGATIVE
GLUCOSE, UR: NEGATIVE mg/dL (ref 0–75)
Hyaline Cast: 2
Ketone: NEGATIVE
NITRITE: NEGATIVE
Ph: 5 (ref 4.5–8.0)
Protein: NEGATIVE
RBC,UR: NONE SEEN /HPF (ref 0–5)
Specific Gravity: 1.016 (ref 1.003–1.030)
Squamous Epithelial: 4

## 2013-05-11 LAB — COMPREHENSIVE METABOLIC PANEL
ALK PHOS: 150 U/L — AB
AST: 15 U/L (ref 15–37)
Albumin: 3.8 g/dL (ref 3.4–5.0)
Anion Gap: 9 (ref 7–16)
BUN: 18 mg/dL (ref 7–18)
Bilirubin,Total: 0.2 mg/dL (ref 0.2–1.0)
CO2: 20 mmol/L — AB (ref 21–32)
CREATININE: 1.5 mg/dL — AB (ref 0.60–1.30)
Calcium, Total: 9 mg/dL (ref 8.5–10.1)
Chloride: 105 mmol/L (ref 98–107)
EGFR (African American): 45 — ABNORMAL LOW
EGFR (Non-African Amer.): 39 — ABNORMAL LOW
GLUCOSE: 116 mg/dL — AB (ref 65–99)
Osmolality: 271 (ref 275–301)
Potassium: 4.2 mmol/L (ref 3.5–5.1)
SGPT (ALT): 18 U/L (ref 12–78)
Sodium: 134 mmol/L — ABNORMAL LOW (ref 136–145)
Total Protein: 8.6 g/dL — ABNORMAL HIGH (ref 6.4–8.2)

## 2013-05-11 LAB — TROPONIN I: Troponin-I: <0.02 ng/mL

## 2013-05-11 LAB — LIPASE, BLOOD: LIPASE: 131 U/L (ref 73–393)

## 2013-05-23 LAB — COMPREHENSIVE METABOLIC PANEL
ALBUMIN: 3.7 g/dL (ref 3.4–5.0)
Alkaline Phosphatase: 130 U/L — ABNORMAL HIGH
Anion Gap: 8 (ref 7–16)
BUN: 31 mg/dL — ABNORMAL HIGH (ref 7–18)
Bilirubin,Total: 0.1 mg/dL — ABNORMAL LOW (ref 0.2–1.0)
CO2: 21 mmol/L (ref 21–32)
Calcium, Total: 8.3 mg/dL — ABNORMAL LOW (ref 8.5–10.1)
Chloride: 109 mmol/L — ABNORMAL HIGH (ref 98–107)
Creatinine: 1.67 mg/dL — ABNORMAL HIGH (ref 0.60–1.30)
GFR CALC AF AMER: 40 — AB
GFR CALC NON AF AMER: 34 — AB
Glucose: 139 mg/dL — ABNORMAL HIGH (ref 65–99)
Osmolality: 284 (ref 275–301)
Potassium: 4 mmol/L (ref 3.5–5.1)
SGOT(AST): 22 U/L (ref 15–37)
SGPT (ALT): 26 U/L (ref 12–78)
Sodium: 138 mmol/L (ref 136–145)
Total Protein: 8.1 g/dL (ref 6.4–8.2)

## 2013-05-23 LAB — CBC
HCT: 40.8 % (ref 35.0–47.0)
HGB: 13.2 g/dL (ref 12.0–16.0)
MCH: 26.6 pg (ref 26.0–34.0)
MCHC: 32.5 g/dL (ref 32.0–36.0)
MCV: 82 fL (ref 80–100)
PLATELETS: 414 10*3/uL (ref 150–440)
RBC: 4.98 10*6/uL (ref 3.80–5.20)
RDW: 17.3 % — ABNORMAL HIGH (ref 11.5–14.5)
WBC: 15 10*3/uL — AB (ref 3.6–11.0)

## 2013-05-23 LAB — ETHANOL
Ethanol %: <0.003 % (ref 0.000–0.080)
Ethanol: <3 mg/dL

## 2013-05-23 LAB — TSH: THYROID STIMULATING HORM: 1.33 u[IU]/mL

## 2013-05-23 LAB — ACETAMINOPHEN LEVEL: Acetaminophen: 6 ug/mL — ABNORMAL LOW

## 2013-05-23 LAB — SALICYLATE LEVEL: Salicylates, Serum: 2.8 mg/dL

## 2013-05-24 ENCOUNTER — Inpatient Hospital Stay: Payer: Self-pay | Admitting: Psychiatry

## 2013-05-24 LAB — DRUG SCREEN, URINE
AMPHETAMINES, UR SCREEN: NEGATIVE (ref ?–1000)
Barbiturates, Ur Screen: POSITIVE (ref ?–200)
Benzodiazepine, Ur Scrn: POSITIVE (ref ?–200)
CANNABINOID 50 NG, UR ~~LOC~~: NEGATIVE (ref ?–50)
COCAINE METABOLITE, UR ~~LOC~~: NEGATIVE (ref ?–300)
MDMA (ECSTASY) UR SCREEN: NEGATIVE (ref ?–500)
METHADONE, UR SCREEN: NEGATIVE (ref ?–300)
Opiate, Ur Screen: NEGATIVE (ref ?–300)
PHENCYCLIDINE (PCP) UR S: NEGATIVE (ref ?–25)
TRICYCLIC, UR SCREEN: POSITIVE (ref ?–1000)

## 2013-05-24 LAB — URINALYSIS, COMPLETE
BLOOD: NEGATIVE
Bilirubin,UR: NEGATIVE
GLUCOSE, UR: NEGATIVE mg/dL (ref 0–75)
Hyaline Cast: 5
KETONE: NEGATIVE
Leukocyte Esterase: NEGATIVE
Nitrite: NEGATIVE
Ph: 5 (ref 4.5–8.0)
Protein: NEGATIVE
RBC,UR: 1 /HPF (ref 0–5)
SPECIFIC GRAVITY: 1.026 (ref 1.003–1.030)
Squamous Epithelial: 1
WBC UR: 3 /HPF (ref 0–5)

## 2013-05-25 ENCOUNTER — Inpatient Hospital Stay: Payer: Self-pay | Admitting: Family Medicine

## 2013-05-25 LAB — COMPREHENSIVE METABOLIC PANEL
ALK PHOS: 105 U/L
AST: 26 U/L (ref 15–37)
Albumin: 3 g/dL — ABNORMAL LOW (ref 3.4–5.0)
Anion Gap: 10 (ref 7–16)
BILIRUBIN TOTAL: 0.2 mg/dL (ref 0.2–1.0)
BUN: 42 mg/dL — ABNORMAL HIGH (ref 7–18)
CALCIUM: 7.8 mg/dL — AB (ref 8.5–10.1)
CHLORIDE: 108 mmol/L — AB (ref 98–107)
Co2: 20 mmol/L — ABNORMAL LOW (ref 21–32)
Creatinine: 2.2 mg/dL — ABNORMAL HIGH (ref 0.60–1.30)
EGFR (Non-African Amer.): 24 — ABNORMAL LOW
GFR CALC AF AMER: 28 — AB
GLUCOSE: 131 mg/dL — AB (ref 65–99)
Osmolality: 288 (ref 275–301)
Potassium: 3.7 mmol/L (ref 3.5–5.1)
SGPT (ALT): 18 U/L (ref 12–78)
Sodium: 138 mmol/L (ref 136–145)
Total Protein: 6.8 g/dL (ref 6.4–8.2)

## 2013-05-25 LAB — URINALYSIS, COMPLETE
BACTERIA: NONE SEEN
BILIRUBIN, UR: NEGATIVE
BLOOD: NEGATIVE
Glucose,UR: NEGATIVE mg/dL (ref 0–75)
Ketone: NEGATIVE
Leukocyte Esterase: NEGATIVE
Nitrite: NEGATIVE
Ph: 5 (ref 4.5–8.0)
Protein: NEGATIVE
RBC,UR: NONE SEEN /HPF (ref 0–5)
SPECIFIC GRAVITY: 1.015 (ref 1.003–1.030)
Squamous Epithelial: 1

## 2013-05-25 LAB — CBC WITH DIFFERENTIAL/PLATELET
BASOS ABS: 0.1 10*3/uL (ref 0.0–0.1)
BASOS PCT: 0.8 %
EOS PCT: 1.3 %
Eosinophil #: 0.2 10*3/uL (ref 0.0–0.7)
HCT: 36.1 % (ref 35.0–47.0)
HGB: 11.4 g/dL — ABNORMAL LOW (ref 12.0–16.0)
Lymphocyte #: 3 10*3/uL (ref 1.0–3.6)
Lymphocyte %: 20.4 %
MCH: 25.9 pg — AB (ref 26.0–34.0)
MCHC: 31.5 g/dL — ABNORMAL LOW (ref 32.0–36.0)
MCV: 82 fL (ref 80–100)
Monocyte #: 1.2 x10 3/mm — ABNORMAL HIGH (ref 0.2–0.9)
Monocyte %: 8 %
NEUTROS ABS: 10.2 10*3/uL — AB (ref 1.4–6.5)
Neutrophil %: 69.5 %
Platelet: 294 10*3/uL (ref 150–440)
RBC: 4.39 10*6/uL (ref 3.80–5.20)
RDW: 17.8 % — AB (ref 11.5–14.5)
WBC: 14.6 10*3/uL — ABNORMAL HIGH (ref 3.6–11.0)

## 2013-05-25 LAB — TROPONIN I: Troponin-I: <0.02 ng/mL

## 2013-05-25 LAB — OCCULT BLOOD X 1 CARD TO LAB, STOOL: OCCULT BLOOD, FECES: POSITIVE

## 2013-05-25 LAB — CLOSTRIDIUM DIFFICILE(ARMC)

## 2013-05-25 LAB — MAGNESIUM: Magnesium: 2.1 mg/dL

## 2013-05-26 LAB — CBC WITH DIFFERENTIAL/PLATELET
Basophil #: 0.1 10*3/uL (ref 0.0–0.1)
Basophil %: 0.7 %
Eosinophil #: 0 10*3/uL (ref 0.0–0.7)
Eosinophil %: 0.2 %
HCT: 34 % — AB (ref 35.0–47.0)
HGB: 10.6 g/dL — ABNORMAL LOW (ref 12.0–16.0)
LYMPHS ABS: 1 10*3/uL (ref 1.0–3.6)
LYMPHS PCT: 4.7 %
MCH: 25.8 pg — ABNORMAL LOW (ref 26.0–34.0)
MCHC: 31.2 g/dL — ABNORMAL LOW (ref 32.0–36.0)
MCV: 83 fL (ref 80–100)
MONO ABS: 1.2 x10 3/mm — AB (ref 0.2–0.9)
Monocyte %: 5.7 %
Neutrophil #: 18 10*3/uL — ABNORMAL HIGH (ref 1.4–6.5)
Neutrophil %: 88.7 %
PLATELETS: 272 10*3/uL (ref 150–440)
RBC: 4.1 10*6/uL (ref 3.80–5.20)
RDW: 17.6 % — AB (ref 11.5–14.5)
WBC: 20.3 10*3/uL — AB (ref 3.6–11.0)

## 2013-05-26 LAB — BASIC METABOLIC PANEL
ANION GAP: 7 (ref 7–16)
BUN: 34 mg/dL — AB (ref 7–18)
Calcium, Total: 7.8 mg/dL — ABNORMAL LOW (ref 8.5–10.1)
Chloride: 113 mmol/L — ABNORMAL HIGH (ref 98–107)
Co2: 20 mmol/L — ABNORMAL LOW (ref 21–32)
Creatinine: 1.69 mg/dL — ABNORMAL HIGH (ref 0.60–1.30)
EGFR (African American): 39 — ABNORMAL LOW
GFR CALC NON AF AMER: 34 — AB
GLUCOSE: 145 mg/dL — AB (ref 65–99)
Osmolality: 290 (ref 275–301)
POTASSIUM: 4.3 mmol/L (ref 3.5–5.1)
SODIUM: 140 mmol/L (ref 136–145)

## 2013-05-26 LAB — TROPONIN I: Troponin-I: <0.02 ng/mL

## 2013-05-27 LAB — CBC WITH DIFFERENTIAL/PLATELET
BASOS PCT: 0.4 %
Basophil #: 0.1 10*3/uL (ref 0.0–0.1)
Eosinophil #: 0.2 10*3/uL (ref 0.0–0.7)
Eosinophil %: 1.5 %
HCT: 29 % — AB (ref 35.0–47.0)
HGB: 9.3 g/dL — ABNORMAL LOW (ref 12.0–16.0)
LYMPHS ABS: 2 10*3/uL (ref 1.0–3.6)
LYMPHS PCT: 14.2 %
MCH: 26.8 pg (ref 26.0–34.0)
MCHC: 32.2 g/dL (ref 32.0–36.0)
MCV: 83 fL (ref 80–100)
MONO ABS: 1.4 x10 3/mm — AB (ref 0.2–0.9)
Monocyte %: 10.2 %
NEUTROS ABS: 10.2 10*3/uL — AB (ref 1.4–6.5)
Neutrophil %: 73.7 %
Platelet: 225 10*3/uL (ref 150–440)
RBC: 3.48 10*6/uL — ABNORMAL LOW (ref 3.80–5.20)
RDW: 17.7 % — ABNORMAL HIGH (ref 11.5–14.5)
WBC: 13.9 10*3/uL — AB (ref 3.6–11.0)

## 2013-05-27 LAB — BASIC METABOLIC PANEL
ANION GAP: 6 — AB (ref 7–16)
BUN: 15 mg/dL (ref 7–18)
CALCIUM: 7.6 mg/dL — AB (ref 8.5–10.1)
CHLORIDE: 116 mmol/L — AB (ref 98–107)
Co2: 22 mmol/L (ref 21–32)
Creatinine: 1.24 mg/dL (ref 0.60–1.30)
EGFR (African American): 57 — ABNORMAL LOW
GFR CALC NON AF AMER: 49 — AB
GLUCOSE: 97 mg/dL (ref 65–99)
Osmolality: 288 (ref 275–301)
Potassium: 4 mmol/L (ref 3.5–5.1)
Sodium: 144 mmol/L (ref 136–145)

## 2013-05-28 LAB — CBC WITH DIFFERENTIAL/PLATELET
BASOS ABS: 0.1 10*3/uL (ref 0.0–0.1)
Basophil %: 0.9 %
Eosinophil #: 0.3 10*3/uL (ref 0.0–0.7)
Eosinophil %: 3.1 %
HCT: 32 % — AB (ref 35.0–47.0)
HGB: 10.2 g/dL — AB (ref 12.0–16.0)
Lymphocyte #: 1.5 10*3/uL (ref 1.0–3.6)
Lymphocyte %: 16.3 %
MCH: 26.5 pg (ref 26.0–34.0)
MCHC: 32 g/dL (ref 32.0–36.0)
MCV: 83 fL (ref 80–100)
MONO ABS: 0.7 x10 3/mm (ref 0.2–0.9)
Monocyte %: 7.3 %
Neutrophil #: 6.7 10*3/uL — ABNORMAL HIGH (ref 1.4–6.5)
Neutrophil %: 72.4 %
PLATELETS: 253 10*3/uL (ref 150–440)
RBC: 3.87 10*6/uL (ref 3.80–5.20)
RDW: 17.8 % — ABNORMAL HIGH (ref 11.5–14.5)
WBC: 9.3 10*3/uL (ref 3.6–11.0)

## 2013-05-28 LAB — PROTIME-INR
INR: 1.1
Prothrombin Time: 14.3 secs (ref 11.5–14.7)

## 2013-05-28 LAB — CLOSTRIDIUM DIFFICILE(ARMC)

## 2013-05-28 LAB — OCCULT BLOOD X 1 CARD TO LAB, STOOL: OCCULT BLOOD, FECES: POSITIVE

## 2013-05-30 LAB — CULTURE, BLOOD (SINGLE)

## 2013-10-09 ENCOUNTER — Emergency Department: Payer: Self-pay | Admitting: Emergency Medicine

## 2013-10-09 LAB — BASIC METABOLIC PANEL
Anion Gap: 11 (ref 7–16)
BUN: 23 mg/dL — AB (ref 7–18)
CALCIUM: 8.8 mg/dL (ref 8.5–10.1)
Chloride: 106 mmol/L (ref 98–107)
Co2: 26 mmol/L (ref 21–32)
Creatinine: 1.52 mg/dL — ABNORMAL HIGH (ref 0.60–1.30)
EGFR (African American): 44 — ABNORMAL LOW
GFR CALC NON AF AMER: 38 — AB
GLUCOSE: 117 mg/dL — AB (ref 65–99)
Osmolality: 290 (ref 275–301)
Potassium: 3.4 mmol/L — ABNORMAL LOW (ref 3.5–5.1)
Sodium: 143 mmol/L (ref 136–145)

## 2013-10-09 LAB — CBC
HCT: 41.6 % (ref 35.0–47.0)
HGB: 13.1 g/dL (ref 12.0–16.0)
MCH: 26.7 pg (ref 26.0–34.0)
MCHC: 31.4 g/dL — ABNORMAL LOW (ref 32.0–36.0)
MCV: 85 fL (ref 80–100)
Platelet: 329 10*3/uL (ref 150–440)
RBC: 4.89 10*6/uL (ref 3.80–5.20)
RDW: 16.1 % — AB (ref 11.5–14.5)
WBC: 9.4 10*3/uL (ref 3.6–11.0)

## 2013-10-09 LAB — TROPONIN I: Troponin-I: <0.02 ng/mL

## 2013-10-09 LAB — PRO B NATRIURETIC PEPTIDE: B-Type Natriuretic Peptide: 151 pg/mL — ABNORMAL HIGH (ref 0–125)

## 2013-10-10 LAB — URINALYSIS, COMPLETE
BLOOD: NEGATIVE
Bacteria: NONE SEEN
Bilirubin,UR: NEGATIVE
Glucose,UR: NEGATIVE mg/dL (ref 0–75)
KETONE: NEGATIVE
Leukocyte Esterase: NEGATIVE
NITRITE: NEGATIVE
PH: 5 (ref 4.5–8.0)
Specific Gravity: 1.027 (ref 1.003–1.030)
Squamous Epithelial: 1
WBC UR: 1 /HPF (ref 0–5)

## 2013-10-10 LAB — COMPREHENSIVE METABOLIC PANEL
Albumin: 3.7 g/dL (ref 3.4–5.0)
Alkaline Phosphatase: 105 U/L
Anion Gap: 11 (ref 7–16)
BILIRUBIN TOTAL: 0.1 mg/dL — AB (ref 0.2–1.0)
BUN: 23 mg/dL — ABNORMAL HIGH (ref 7–18)
CALCIUM: 8.7 mg/dL (ref 8.5–10.1)
CHLORIDE: 108 mmol/L — AB (ref 98–107)
Co2: 24 mmol/L (ref 21–32)
Creatinine: 1.53 mg/dL — ABNORMAL HIGH (ref 0.60–1.30)
EGFR (African American): 44 — ABNORMAL LOW
EGFR (Non-African Amer.): 38 — ABNORMAL LOW
GLUCOSE: 122 mg/dL — AB (ref 65–99)
Osmolality: 290 (ref 275–301)
Potassium: 3.2 mmol/L — ABNORMAL LOW (ref 3.5–5.1)
SGOT(AST): 22 U/L (ref 15–37)
SGPT (ALT): 22 U/L
Sodium: 143 mmol/L (ref 136–145)
TOTAL PROTEIN: 7.7 g/dL (ref 6.4–8.2)

## 2013-10-10 LAB — CBC WITH DIFFERENTIAL/PLATELET
Basophil #: 0.1 10*3/uL (ref 0.0–0.1)
Basophil %: 0.9 %
EOS ABS: 0.2 10*3/uL (ref 0.0–0.7)
EOS PCT: 1.9 %
HCT: 40.2 % (ref 35.0–47.0)
HGB: 12.9 g/dL (ref 12.0–16.0)
LYMPHS PCT: 25 %
Lymphocyte #: 3.1 10*3/uL (ref 1.0–3.6)
MCH: 27.2 pg (ref 26.0–34.0)
MCHC: 32 g/dL (ref 32.0–36.0)
MCV: 85 fL (ref 80–100)
Monocyte #: 1.1 x10 3/mm — ABNORMAL HIGH (ref 0.2–0.9)
Monocyte %: 8.4 %
NEUTROS PCT: 63.8 %
Neutrophil #: 8 10*3/uL — ABNORMAL HIGH (ref 1.4–6.5)
Platelet: 325 10*3/uL (ref 150–440)
RBC: 4.74 10*6/uL (ref 3.80–5.20)
RDW: 16 % — ABNORMAL HIGH (ref 11.5–14.5)
WBC: 12.6 10*3/uL — AB (ref 3.6–11.0)

## 2013-10-10 LAB — LIPASE, BLOOD: LIPASE: 124 U/L (ref 73–393)

## 2013-10-10 LAB — TROPONIN I

## 2013-10-28 ENCOUNTER — Emergency Department: Payer: Self-pay | Admitting: Emergency Medicine

## 2013-10-29 LAB — TROPONIN I: Troponin-I: <0.02 ng/mL

## 2013-10-29 LAB — URINALYSIS, COMPLETE
BLOOD: NEGATIVE
Bacteria: NONE SEEN
Bilirubin,UR: NEGATIVE
Glucose,UR: NEGATIVE mg/dL (ref 0–75)
Ketone: NEGATIVE
LEUKOCYTE ESTERASE: NEGATIVE
Nitrite: NEGATIVE
PH: 5 (ref 4.5–8.0)
PROTEIN: NEGATIVE
RBC,UR: <1 /HPF (ref 0–5)
Specific Gravity: 1.008 (ref 1.003–1.030)

## 2013-10-29 LAB — CBC
HCT: 40.4 % (ref 35.0–47.0)
HGB: 13.3 g/dL (ref 12.0–16.0)
MCH: 27.8 pg (ref 26.0–34.0)
MCHC: 32.8 g/dL (ref 32.0–36.0)
MCV: 85 fL (ref 80–100)
PLATELETS: 367 10*3/uL (ref 150–440)
RBC: 4.77 10*6/uL (ref 3.80–5.20)
RDW: 15.6 % — ABNORMAL HIGH (ref 11.5–14.5)
WBC: 10.1 10*3/uL (ref 3.6–11.0)

## 2013-10-29 LAB — COMPREHENSIVE METABOLIC PANEL
ALK PHOS: 123 U/L — AB
AST: 26 U/L (ref 15–37)
Albumin: 3.7 g/dL (ref 3.4–5.0)
Anion Gap: 8 (ref 7–16)
BILIRUBIN TOTAL: 0.1 mg/dL — AB (ref 0.2–1.0)
BUN: 32 mg/dL — ABNORMAL HIGH (ref 7–18)
CALCIUM: 8.5 mg/dL (ref 8.5–10.1)
Chloride: 99 mmol/L (ref 98–107)
Co2: 27 mmol/L (ref 21–32)
Creatinine: 1.5 mg/dL — ABNORMAL HIGH (ref 0.60–1.30)
GFR CALC AF AMER: 45 — AB
GFR CALC NON AF AMER: 39 — AB
Glucose: 134 mg/dL — ABNORMAL HIGH (ref 65–99)
Osmolality: 277 (ref 275–301)
POTASSIUM: 3.3 mmol/L — AB (ref 3.5–5.1)
SGPT (ALT): 35 U/L
SODIUM: 134 mmol/L — AB (ref 136–145)
TOTAL PROTEIN: 8 g/dL (ref 6.4–8.2)

## 2013-10-29 LAB — ETHANOL: Ethanol: <3 mg/dL

## 2013-10-29 LAB — LIPASE, BLOOD: LIPASE: 99 U/L (ref 73–393)

## 2013-10-31 ENCOUNTER — Emergency Department: Payer: Self-pay | Admitting: Emergency Medicine

## 2013-10-31 LAB — URINALYSIS, COMPLETE
BILIRUBIN, UR: NEGATIVE
Bacteria: NONE SEEN
Glucose,UR: NEGATIVE mg/dL (ref 0–75)
Ketone: NEGATIVE
Nitrite: NEGATIVE
Ph: 5 (ref 4.5–8.0)
Renal Epithelial: 9
SPECIFIC GRAVITY: 1.032 (ref 1.003–1.030)
Squamous Epithelial: 2
WBC UR: 25 /HPF (ref 0–5)

## 2013-10-31 LAB — CBC
HCT: 39.2 % (ref 35.0–47.0)
HGB: 12.7 g/dL (ref 12.0–16.0)
MCH: 27.6 pg (ref 26.0–34.0)
MCHC: 32.4 g/dL (ref 32.0–36.0)
MCV: 85 fL (ref 80–100)
PLATELETS: 370 10*3/uL (ref 150–440)
RBC: 4.59 10*6/uL (ref 3.80–5.20)
RDW: 15.8 % — ABNORMAL HIGH (ref 11.5–14.5)
WBC: 10 10*3/uL (ref 3.6–11.0)

## 2013-10-31 LAB — COMPREHENSIVE METABOLIC PANEL
ALBUMIN: 3.5 g/dL (ref 3.4–5.0)
Alkaline Phosphatase: 124 U/L — ABNORMAL HIGH
Anion Gap: 8 (ref 7–16)
BUN: 20 mg/dL — AB (ref 7–18)
Bilirubin,Total: 0.1 mg/dL — ABNORMAL LOW (ref 0.2–1.0)
CHLORIDE: 102 mmol/L (ref 98–107)
CO2: 27 mmol/L (ref 21–32)
Calcium, Total: 9.1 mg/dL (ref 8.5–10.1)
Creatinine: 1.17 mg/dL (ref 0.60–1.30)
GFR CALC NON AF AMER: 52 — AB
Glucose: 104 mg/dL — ABNORMAL HIGH (ref 65–99)
Osmolality: 277 (ref 275–301)
Potassium: 3.5 mmol/L (ref 3.5–5.1)
SGOT(AST): 17 U/L (ref 15–37)
SGPT (ALT): 29 U/L
SODIUM: 137 mmol/L (ref 136–145)
TOTAL PROTEIN: 7.6 g/dL (ref 6.4–8.2)

## 2013-10-31 LAB — LIPASE, BLOOD: Lipase: 93 U/L (ref 73–393)

## 2013-10-31 LAB — TROPONIN I: Troponin-I: <0.02 ng/mL

## 2013-11-01 ENCOUNTER — Emergency Department: Payer: Self-pay | Admitting: Emergency Medicine

## 2013-11-01 LAB — COMPREHENSIVE METABOLIC PANEL
ALK PHOS: 130 U/L — AB
ALT: 30 U/L
ANION GAP: 10 (ref 7–16)
Albumin: 3.5 g/dL (ref 3.4–5.0)
BUN: 18 mg/dL (ref 7–18)
Bilirubin,Total: 0.1 mg/dL — ABNORMAL LOW (ref 0.2–1.0)
Calcium, Total: 9.3 mg/dL (ref 8.5–10.1)
Chloride: 102 mmol/L (ref 98–107)
Co2: 25 mmol/L (ref 21–32)
Creatinine: 1.36 mg/dL — ABNORMAL HIGH (ref 0.60–1.30)
EGFR (African American): 51 — ABNORMAL LOW
GFR CALC NON AF AMER: 44 — AB
GLUCOSE: 124 mg/dL — AB (ref 65–99)
Osmolality: 277 (ref 275–301)
POTASSIUM: 3.9 mmol/L (ref 3.5–5.1)
SGOT(AST): 26 U/L (ref 15–37)
Sodium: 137 mmol/L (ref 136–145)
Total Protein: 8 g/dL (ref 6.4–8.2)

## 2013-11-01 LAB — CBC
HCT: 41.2 % (ref 35.0–47.0)
HGB: 12.8 g/dL (ref 12.0–16.0)
MCH: 26.8 pg (ref 26.0–34.0)
MCHC: 31.1 g/dL — ABNORMAL LOW (ref 32.0–36.0)
MCV: 86 fL (ref 80–100)
PLATELETS: 380 10*3/uL (ref 150–440)
RBC: 4.79 10*6/uL (ref 3.80–5.20)
RDW: 15.4 % — AB (ref 11.5–14.5)
WBC: 9.7 10*3/uL (ref 3.6–11.0)

## 2013-11-01 LAB — URINALYSIS, COMPLETE
BACTERIA: NONE SEEN
BILIRUBIN, UR: NEGATIVE
GLUCOSE, UR: NEGATIVE mg/dL (ref 0–75)
KETONE: NEGATIVE
Leukocyte Esterase: NEGATIVE
Nitrite: NEGATIVE
PH: 5 (ref 4.5–8.0)
PROTEIN: NEGATIVE
SPECIFIC GRAVITY: 1.01 (ref 1.003–1.030)
Squamous Epithelial: <1
WBC UR: 3 /HPF (ref 0–5)

## 2013-12-02 ENCOUNTER — Emergency Department: Payer: Self-pay | Admitting: Emergency Medicine

## 2013-12-03 ENCOUNTER — Emergency Department: Payer: Self-pay | Admitting: Student

## 2013-12-03 LAB — TSH: Thyroid Stimulating Horm: 0.58 u[IU]/mL

## 2013-12-03 LAB — COMPREHENSIVE METABOLIC PANEL
ANION GAP: 9 (ref 7–16)
AST: 33 U/L (ref 15–37)
Albumin: 3.1 g/dL — ABNORMAL LOW (ref 3.4–5.0)
Alkaline Phosphatase: 101 U/L
BUN: 14 mg/dL (ref 7–18)
Bilirubin,Total: 0.2 mg/dL (ref 0.2–1.0)
CALCIUM: 7.7 mg/dL — AB (ref 8.5–10.1)
CHLORIDE: 111 mmol/L — AB (ref 98–107)
CO2: 21 mmol/L (ref 21–32)
CREATININE: 1.22 mg/dL (ref 0.60–1.30)
EGFR (African American): 59 — ABNORMAL LOW
GFR CALC NON AF AMER: 49 — AB
Glucose: 115 mg/dL — ABNORMAL HIGH (ref 65–99)
OSMOLALITY: 283 (ref 275–301)
Potassium: 4.2 mmol/L (ref 3.5–5.1)
SGPT (ALT): 18 U/L
Sodium: 141 mmol/L (ref 136–145)
Total Protein: 7 g/dL (ref 6.4–8.2)

## 2013-12-03 LAB — ETHANOL

## 2013-12-03 LAB — DRUG SCREEN, URINE
Amphetamines, Ur Screen: NEGATIVE (ref ?–1000)
Barbiturates, Ur Screen: POSITIVE (ref ?–200)
Benzodiazepine, Ur Scrn: POSITIVE (ref ?–200)
COCAINE METABOLITE, UR ~~LOC~~: NEGATIVE (ref ?–300)
Cannabinoid 50 Ng, Ur ~~LOC~~: NEGATIVE (ref ?–50)
MDMA (Ecstasy)Ur Screen: NEGATIVE (ref ?–500)
Methadone, Ur Screen: NEGATIVE (ref ?–300)
Opiate, Ur Screen: POSITIVE (ref ?–300)
Phencyclidine (PCP) Ur S: NEGATIVE (ref ?–25)
TRICYCLIC, UR SCREEN: POSITIVE (ref ?–1000)

## 2013-12-03 LAB — CBC
HCT: 40.1 % (ref 35.0–47.0)
HGB: 12.5 g/dL (ref 12.0–16.0)
MCH: 27.2 pg (ref 26.0–34.0)
MCHC: 31.2 g/dL — AB (ref 32.0–36.0)
MCV: 87 fL (ref 80–100)
Platelet: 340 10*3/uL (ref 150–440)
RBC: 4.61 10*6/uL (ref 3.80–5.20)
RDW: 16.7 % — ABNORMAL HIGH (ref 11.5–14.5)
WBC: 11.2 10*3/uL — ABNORMAL HIGH (ref 3.6–11.0)

## 2013-12-03 LAB — URINALYSIS, COMPLETE
BLOOD: NEGATIVE
Bilirubin,UR: NEGATIVE
Glucose,UR: NEGATIVE mg/dL (ref 0–75)
Ketone: NEGATIVE
LEUKOCYTE ESTERASE: NEGATIVE
Nitrite: POSITIVE
Ph: 5 (ref 4.5–8.0)
Protein: 30
RBC,UR: NONE SEEN /HPF (ref 0–5)
SPECIFIC GRAVITY: 1.024 (ref 1.003–1.030)

## 2013-12-03 LAB — ACETAMINOPHEN LEVEL: Acetaminophen: 9 ug/mL — ABNORMAL LOW

## 2013-12-03 LAB — SALICYLATE LEVEL: Salicylates, Serum: <1.7 mg/dL

## 2014-06-09 NOTE — Consult Note (Signed)
Pt seen and examined. Please see Whitney Bernard's notes. Epig pain. Elevated lipase. GB out 10 yrs ago. No EtOH. NSAIDS use. Was planning to do EGD this afternoon, but patient drank apple juice at 1230 despite our recommendation not to. Thus, EGD cancelled. Continue PPI daily. Moniter lipase. If sxs improve quickly, then patient can be discharged over the weekend, and we can schedule EGD as outpt. Otherwise, plan EGD on Monday. Will follow. Thanks.  Electronic Signatures: Lutricia Feil (MD)  (Signed on 10-Jan-14 14:26)  Authored  Last Updated: 10-Jan-14 14:26 by Lutricia Feil (MD)

## 2014-06-09 NOTE — Consult Note (Signed)
Brief Consult Note: Diagnosis: Epigastric abdominal pain with associated nausea.  No vomiting. Onset of symptoms one week ago.  Experiencing change in bowel habits, diarrhea.  Acute renal failure secondary to dehydration.  Eleated lipase, questionable acute pancreatitis,  CT scan of abdomen and pelvis without contrast was done and unremarkable.  Chronic NSAID use.   Consult note dictated.   Orders entered.   Discussed with Attending MD.   Comments: Patient's presentation discussed with Dr. Lutricia Feil.  Will proceed with EGD today to allow direct luminal evaluatin of upper GI tract.  To assess for evidence of ulcer disease, inflammation.  Order placed.  NPO status.  Last clear liquid intake was 9:30 am.  Continued with PPI therapy.  Will continue to monitor.  Procedure discussed witih patient..  Electronic Signatures: Rodman Key (NP)  (Signed 10-Jan-14 13:52)  Authored: Brief Consult Note   Last Updated: 10-Jan-14 13:52 by Rodman Key (NP)
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
## 2014-06-09 NOTE — Consult Note (Signed)
Pt has some pain but eating regular food.  Her ANA and IgG4 were neg.  She thinks she is going home tomorrow.  I recommend discharge on Actigal and will follow her up in a few weeks.  Electronic Signatures: Scot Jun (MD)  (Signed on 27-Jan-14 18:15)  Authored  Last Updated: 27-Jan-14 18:15 by Scot Jun (MD)

## 2014-06-09 NOTE — H&P (Signed)
PATIENT NAME:  Whitney Bernard, Whitney Bernard MR#:  [id number] DATE OF BIRTH:  1960-12-02  DATE OF ADMISSION:  02/26/2012  PRIMARY CARE PHYSICIAN: Dr. Juel Burrow   CHIEF COMPLAINT: Epigastric pain, persistent nausea and poor p.o. intake.   HISTORY OF PRESENT ILLNESS: This is a 54 year old female who comes into the hospital due to a 1-week history of epigastric pain, nausea, and poor p.o. intake. She describes the pain as a dull ache in nature, it never really goes away, it is persistent all day long. She also has persistent nausea and has not been eating and drinking well for the past week or so. She also admits to a 10-pound weight loss since then. She presented to the Emergency Room, was noted to be in acute renal failure with a creatinine up to at least 2.2, base being around 1.3. to 1.4. She had a CT of the abdomen and pelvis without contrast which showed no acute pathology. Due to her acute renal failure, dehydration and persistent epigastric pain, hospitalist services were contacted for further treatment and evaluation.   The patient denies any fevers or chills. She denies any chest pain. She denies any shortness of breath. She does admit to some mild diarrhea, but it is nonbloody.   REVIEW OF SYSTEMS:   CONSTITUTIONAL: No documented fever, positive 10-pound weight loss, no weight gain.  EYES: No blurred or double vision.  ENT: No tinnitus or postnasal drip. No redness of the oropharynx.  RESPIRATORY: No cough, no wheeze, no hemoptysis, no dyspnea.  CARDIOVASCULAR: No chest pain, no orthopnea, no palpitations, no syncope.  GASTROINTESTINAL: Positive nausea. No vomiting. Positive diarrhea. Positive epigastric abdominal pain. No melena, no hematochezia.  GENITOURINARY: No dysuria, no hematuria.  ENDOCRINE: No polyuria or nocturia. No heat or cold intolerance.  HEMATOLOGIC: No anemia. No bruising. No bleeding.  INTEGUMENTARY: No rashes. No lesions.  MUSCULOSKELETAL: No arthritis, no swelling, and no gout.   NEUROLOGIC: No numbness, no tingling, no ataxia, no seizure-type activity.  PSYCH: No anxiety, no insomnia, no ADD.   PAST MEDICAL HISTORY: Consistent with hypertension, anxiety, GERD, chronic kidney disease, stage III.   ALLERGIES: ALMONDS, BUT NO MEDICATIONS.   SOCIAL HISTORY: No smoking presently but used to smoke. She does have a 20 pack-year smoking history. No alcohol abuse. No illicit drug abuse. She lives at home with her husband.   FAMILY HISTORY: Mother died from complications of Alzheimer's dementia. Father died from an MI.   CURRENT MEDICATIONS: Atenolol 25 mg daily, Lasix 40 mg b.i.d., lisinopril 10 mg daily,  Paxil 40 mg daily, potassium 20 mEq t.i.d., and Prilosec 40 mg daily.   PHYSICAL EXAMINATION:  VITAL SIGNS: Temperature 97.8, pulse 81, respirations 16, blood pressure 101/63, sats 98% on room air.  GENERAL: She is a pleasant-appearing female in mild distress.  HEENT: Atraumatic, normocephalic. Extraocular muscles are intact. Pupils are equal and reactive to light. Sclerae are anicteric. No conjunctival injection. No pharyngeal erythema.  NECK: Supple. No jugular venous distention, no bruits, no lymphadenopathy, no thyromegaly.  HEART: Regular rate and rhythm. No murmurs, no rubs, no clicks.  LUNGS: Clear to auscultation bilaterally. No rales, rhonchi, no wheezes.  ABDOMEN: Soft, flat, tender in the epigastric area, no rebound, no rigidity. Good bowel sounds. No hepatosplenomegaly appreciated.  EXTREMITIES: No evidence of any cyanosis, clubbing, or peripheral edema. Pedal and radial pulses +2 bilaterally.  NEUROLOGICAL: The patient is alert, awake, and oriented x 3 with no focal motor or sensory deficits appreciated bilaterally.  SKIN: Moist and warm with no  rash appreciated.  LYMPHATIC: There is no cervical or axillary lymphadenopathy.   LABORATORY AND RADIOLOGICAL DATA:  Serum glucose 111, BUN 32, creatinine 2.2, sodium 138, potassium 3.9, chloride 112, bicarbonate  16. LFTs are otherwise within normal limits. Troponin less than 0.02. White cell count 12, hemoglobin 13.5, hematocrit 40.9, platelet count 389.   The patient did have a CT of the abdomen and pelvis done without contrast which showed extensive diverticulosis without definite CT evidence of acute diverticulitis.  ASSESSMENT AND PLAN: This is a 54 year old female with a history of hypertension, anxiety, gastroesophageal reflux disease, chronic kidney disease, stage III, who presents to the hospital with persistent nausea epigastric pain and poor p.o. intake, also noted to be in acute on chronic renal failure.   1. Acute on chronic renal failure: Baseline creatinine is around 1.3 to 1.5, currently elevated at 2.2. This is likely secondary to dehydration and poor p.o. intake. I will gently hydrate her with IV fluids, follow BUN and creatinine, urine output. Renal dose meds, avoid nephrotoxins, hold her Lasix and ACE inhibitor for now. Consider referring her for outpatient nephrology.  2. Epigastric pain with nausea: The exact etiology of this is unclear. The patient had a CT of abdomen and pelvis done which showed no acute pathology, but it was done without contrast. I cannot give her contrast given her renal failure. For now, I will go ahead and check her lipase to rule out pancreatitis. Supportive care with IV fluids, pain control, and antiemetics. Continue her proton pump inhibitor for now. I will get a GI consult to see if the patient would benefit from getting a possible endoscopy.  3. Gastroesophageal reflux disease: I would continue with her Prilosec.  4. Hypertension: Continue atenolol, hold Lasix and ACE inhibitor given the renal failure.  5. Anxiety: Continue Paxil.   CODE STATUS: The patient is a FULL CODE.   TIME SPENT WITH THE ADMISSION: 45 minutes.  ____________________________ Rolly Pancake. Cherlynn Kaiser, MD vjs:cb D: 02/26/2012 17:37:26 ET T: 02/26/2012 18:00:42 ET JOB#: [id number]  cc: Rolly Pancake.  Cherlynn Kaiser, MD, <Dictator> Houston Siren MD ELECTRONICALLY SIGNED 03/02/2012 15:34

## 2014-06-09 NOTE — H&P (Signed)
PATIENT NAME:  Whitney Bernard, CLAYCOMB MR#:  [id number] DATE OF BIRTH:  05/01/1960  DATE OF ADMISSION:  04/11/2012  PRIMARY CARE PHYSICIAN: Dr. Cay Schillings REFERRING PHYSICIAN:   Dr. Clemens Catholic  CHIEF COMPLAINT: Abdominal pain and diarrhea, 7 days.   HISTORY OF PRESENT ILLNESS: The patient is a 54 year old Caucasian female with a history of acute pancreatitis, hypertension, anxiety, GERD, chronic kidney disease stage III, depression, recent diagnosis of diverticulitis, presented to the ED with abdominal pain and diarrhea for the past 1 week. The patient is alert, awake, oriented, in no acute distress. According to the patient, she was diagnosed with diverticulitis recently, has been treated with Flagyl and Cipro. However, the patient still has abdominal pain which is in the left lower quadrant, persistent, waxes and wanes, without radiation. In addition, the patient has had watery and loose stool for the past 7 days multiple times a day.  The patient was also treated for UTI with Cipro for the past 2 days, but she denies any fever or chills. No headache or dizziness. No dysuria, hematuria or incontinence. She denies any melena or bloody stools. The patient was noted to have a WBC of 18.8, potassium 2.2, magnesium 1.5. She was admitted for hypokalemia.  The  was treated with potassium 20 mEq IV and p.o. 40 mEq in the ED.   PAST MEDICAL HISTORY: As mentioned above, acute pancreatitis, diverticulitis, urinary tract infection, hypertension, anxiety, GERD, CKD stage III, depression.   SOCIAL HISTORY: She denies any smoking or drinking or illicit drugs.   FAMILY HISTORY: Mother has Alzheimer's disease dementia and cardiac disease in her father.   ALLERGIES: ALMONDS.    HOME MEDICATIONS:  1. Xanax 0.5 mg p.o. b.i.d.  2. Tramadol 50 mg p.o. 2 tablets every 4 hours p.r.n.  3. Potassium 20 mEq p.o. once a day.  4. Paroxetine 40 mg p.o. daily. 5. Lasix 40 mg p.o. daily.  6. Flagyl 500 mg p.o. q. 8 hours. 7. Cipro 500 mg  p.o. every 12 hours.  8. Actigall 300 mg 1 cap b.i.d.   REVIEW OF SYSTEMS:   CONSTITUTIONAL: The patient denies any fever or chills. No headache or dizziness but has generalized weakness.  EYES: No double vision, blurred vision.  ENT: No postnasal drip, slurred speech, or dysphagia. No epistaxis.  CARDIOVASCULAR: No chest pain, palpitation, orthopnea, or nocturnal dyspnea. No leg edema.  PULMONARY: No cough, sputum, shortness of breath or hematemesis.  GASTROINTESTINAL: Positive for abdominal pain, nausea, vomiting and diarrhea, but no melena or bloody stools.  GENITOURINARY: No dysuria, hematuria or incontinence.  SKIN: No rash or jaundice.  HEMATOLOGY: No easy bruising or bleeding.  ENDOCRINE: No polyuria, polydipsia, heat or cold intolerance.  NEUROLOGY: No syncope, loss of consciousness or seizure.   PHYSICAL EXAMINATION: VITAL SIGNS: Temperature 98.4, blood pressure 126/78, pulse 72, respirations 18, oxygen saturation 96% on room air.  GENERAL: The patient is alert, awake, oriented, in no acute distress.  HEENT: Pupils are round, equal, reactive to light and accommodation. Mild dry oral mucosa. Clear oropharynx.  NECK: Supple. No JVD or carotid bruits. No lymphadenopathy. No thyromegaly.  CARDIOVASCULAR: S1, S2 regular rate, rhythm. No murmurs, gallops.  PULMONARY: Bilateral air entry. No wheezing or rales. No use of accessory muscles to breathe.  ABDOMEN: Obese, soft, bowel sounds present. No distention but has tenderness on the left upper arm quadrant. No rebound. No rigidity. No wheezing or organomegaly.  EXTREMITIES: No edema, clubbing, or cyanosis. No calf tenderness. Bilateral pedal pulses present.  NEUROLOGY:  The patient is alert, awake, oriented, in no acute distress. Power 5 out of 5. Sensation intact.  SKIN: No rash or jaundice.    LABORATORY, DIAGNOSTIC AND RADIOLOGICAL DATA:  Abdominal and pelvic CAT scan showed a moderate amount of gas and fluid within the colon.  No  obstruction. No acute hepatobiliary abnormality. No acute urinary tract abnormality. Abdominal x-ray: No acute intra-abdominal abnormality. Chest x-ray: No acute abnormality.  EKG showed normal sinus rhythm at 72 BPM.   WBC 18.8, hemoglobin 14.6, platelets 443, glucose 155, BUN 80, creatinine 1.58, which is elevated compared to previous creatinine, sodium 138, potassium 2.2, chloride 106, bicarbonate 20. Urinalysis shows WBC 1898, RBC 7, nitrite is negative. Magnesium 1.5, lipase 100.   IMPRESSION: 1. Systemic inflammatory response syndrome.  2. Urinary tract infection.  3. Diverticulitis.  4. Acute renal failure on chronic kidney disease.  5. Hypokalemia.  6. Hypomagnesemia.  7. Hypertension, controlled.   PLAN OF TREATMENT: 1. The patient will be admitted to a medical floor.  We will continue Rocephin and Flagyl. Follow up urine culture.  2. We will start normal saline IV.  Follow up BMP.  We will hold Lasix.  3. Hypokalemia and hypomagnesemia: We will give potassium. Magnesium was given in the ED.  We  follow up levels.  4. Gastrointestinal and deep vein thrombosis prophylaxis.   I discussed the patient's condition and plan of treatment with the patient. I also discussed the patient's CODE STATUS. The patient said that she wants FULL CODE.    TIME SPENT: About 56 minutes.  ____________________________ Shaune Pollack, MD qc:cb D: 04/11/2012 19:08:12 ET T: 04/11/2012 19:31:17 ET JOB#: [id number]  cc: Shaune Pollack, MD, <Dictator> Shaune Pollack MD ELECTRONICALLY SIGNED 04/12/2012 16:31

## 2014-06-09 NOTE — Discharge Summary (Signed)
PATIENT NAME:  Whitney Bernard, Whitney Bernard MR#:  [id number] DATE OF BIRTH:  1961/02/07  DATE OF ADMISSION:  03/12/2012 DATE OF DISCHARGE:  03/16/2012  DISCHARGE DIAGNOSES: 1.  Acute pancreatitis.  2.  Hyperlipidemia 3.  Gastroesophageal reflux disease.  4.  Hypertension.  5.  Anxiety.   IMAGING STUDIES:  Include an ultrasound of the abdomen, which showed no acute abnormalities.   CONSULT:  Dr. Mechele Collin of GI.   ADMITTING HISTORY AND PHYSICAL AND HOSPITAL COURSE:  A 54 year old female patient with prior history of acute pancreatitis, returned to the hospital with another bout of acute pancreatitis with pain in the epigastric, right upper quadrant area with lipase 600. The patient was admitted to the hospitalist service for further work-up and treatment. The patient's lipase trended down and  pain resolved. The patient had stool study of 72 hours checked as per the GI doctor's request. The patient on the day of discharge had no nausea, vomiting, had minimal abdominal pain well controlled with pain pills, tolerating oral diet and was discharged home in fair condition. On the day of discharge, the patient's blood pressure is 110/70, pulse 81 and was discharged home in fair condition.   DISCHARGE MEDICATIONS: Include:  1.  Paroxetine 1 tablet oral once a day.  2.  Prilosec 40 mg oral once a day. 3.  Xanax 0.5 mg oral 2 times a day.  4.  Lasix 40 mg oral once a day.  5.  Potassium chloride 20 mEq oral 3 times a day.  6.  Acetaminophen hydrocodone 325/5 one tablet every 6 hours as needed for pain. 7.  Zofran ODT 4 mg oral every 6 hours as needed for nausea and vomiting.  8.  Actigall 300 mg 1 capsule oral 2 times a day.   DISCHARGE INSTRUCTIONS: Follow up with Dr. Mechele Collin of GI in 1 to 2 weeks.   DIET:  Regular diet.  ACTIVITY:  Activity as tolerated.   TIME SPENT: On the day of discharge in discharge activity was 40 minutes     ____________________________ Molinda Bailiff. Clary Meeker, MD srs:cc D: 03/20/2012  13:26:20 ET T: 03/21/2012 17:09:09 ET JOB#: [id number]  cc: Wardell Heath R. Clemon Devaul, MD, <Dictator> Orie Fisherman MD ELECTRONICALLY SIGNED 03/25/2012 13:20

## 2014-06-09 NOTE — Consult Note (Signed)
PATIENT NAME:  Whitney Bernard, Whitney Bernard MR#:  [id number] DATE OF BIRTH:  1960-04-23  DATE OF CONSULTATION:  02/27/2012  REFERRING PHYSICIAN:   CONSULTING PHYSICIAN:  Rodman Key, NP  ADDENDUM  PLAN: The patient's presentation was discussed with Dr. Lutricia Feil. We will proceed with EGD today to allow direct luminal evaluation of upper GI tract to assess for evidence of ulcer disease, inflammation and underlying etiological cause of current symptoms in which the patient is having. Order placed. N.p.o. status. Last clear liquid intake was at 9:30 this morning. Continue PPI therapy. We will continue to monitor the patient during her hospitalization. The procedure was discussed with the patient.  These services provided by Rodman Key, MS, APRN, [facility], FNP under collaborative agreement with Lutricia Feil, MD. ____________________________ Rodman Key, NP dsh:sb D: 02/27/2012 13:46:33 ET T: 02/27/2012 14:33:42 ET JOB#: [id number]  cc: Rodman Key, NP, <Dictator> Rodman Key MD ELECTRONICALLY SIGNED 03/04/2012 17:23

## 2014-06-09 NOTE — Consult Note (Signed)
CC: pancreatitis.  Pt lipase has fallen to 186.  She feels better.  She was interested in getting her  colonoscopy done while here if possible.  I reviewed her EGD note and picture and she could do a prep here.  She has had two bouts of pancreatitis with elevated lipase and abd pain.  I think this could be microlithiasis and will start her on Actigal 300mg  bid to try to avoid future bouts.   Electronic Signatures: (MD)  (Signed on 25-Jan-14 14:28)  Authored  Last Updated: 25-Jan-14 14:28 by 04-27-1988 (MD)

## 2014-06-09 NOTE — Consult Note (Signed)
PATIENT NAME:  Whitney Bernard, Whitney Bernard MR#:  [id number] DATE OF BIRTH:  12/09/1960  DATE OF CONSULTATION:  04/14/2012  REFERRING PHYSICIAN:   CONSULTING PHYSICIAN:  Lurline Del, MD  REASON FOR CONSULTATION: Diarrhea.   HISTORY OF PRESENT ILLNESS: This is a 54 year old Caucasian female with history of recent episode of acute pancreatitis a few weeks ago. Also has history of hypertension, anxiety, GERD, chronic kidney disease and depression. Apparently the patient was diagnosed with diverticulitis a couple of weeks ago and was treated with antibiotics. The patient was also treated with antibiotics for UTI recently as well. The patient has history of chronic diarrhea, on and off, for the last several weeks. For about a week she has been having more diarrhea with several loose bowel movements a day. The patient came to the hospital, was found to have a potassium of only 2.2 and magnesium of 1.5. She was admitted for further evaluation and management of diarrhea, dehydration and electrolyte abnormalities. Denies any significant abdominal pain. There has been no vomiting or fever.   PAST MEDICAL HISTORY: As above.   SOCIAL HISTORY: She does not smoke or drink.   FAMILY HISTORY: Unremarkable.   HOME MEDICATIONS: Xanax, tramadol, potassium, paroxetine and  Lasix. She is still on Flagyl and Cipro and Actigall, which was started during her last admission for pancreatitis.    REVIEW OF SYSTEMS:  Grossly negative except for what is mentioned in the history of present illness.   PHYSICAL EXAMINATION: GENERAL: Well-built female. Clinically does not appear to be in any acute distress.  VITAL SIGNS: Temperature is 98, respirations 20 to 22, pulse is about 87 and blood pressure 109/68.  HEENT:  Clinically she does not appear to be jaundiced.  NECK: Neck veins are flat.  PULMONARY: Lungs are grossly clear to auscultation bilaterally with fair air entry. No added sounds.  CARDIOVASCULAR: Regular rate and rhythm. No  gallops or murmur.  ABDOMEN: Mild diffuse abdominal tenderness. There is no rebound or guarding. No hepatosplenomegaly was noted. No significant abdominal distention. Bowel sounds appear to be fairly unremarkable.  NEUROLOGIC: Unremarkable.  LABS AND DIAGNOSTICS: Serum magnesium was low at 1.5 on admission. BUN 4 and creatinine 1.34. Serum lipase is 100. Liver enzymes are quite unremarkable, except for mild elevation of alkaline phosphatase at 169. White cell count was 18,000 on admission, is 10 after 48 hours. Hemoglobin 11.8 and platelet count 337. Stool cultures are pending. Stool for C. diff toxin is negative.   CT scan of the abdomen and pelvis did not show any definite diverticulitis or colitis.   ASSESSMENT AND PLAN: The patient is with chronic diarrhea with recent worsening. She came in with dehydration and electrolyte abnormalities. Clostridium difficile toxin is negative. The patient has recently been treated with Cipro and Flagyl. The patient does have some chronic diarrhea as well. Stool cultures are pending. I would recommend obtaining an inflammatory bowel disease panel. At this point, we will manage her conservatively with hydration and control of her symptoms. The patient would probably require a colonoscopy in the near future as her last colonoscopy was several years ago. Agree with probiotics. Further recommendations to follow.  ____________________________ Lurline Del, MD si:sb D: 04/14/2012 10:13:50 ET T: 04/14/2012 10:36:36 ET JOB#: [id number]  cc: Lurline Del, MD, <Dictator> Lurline Del MD ELECTRONICALLY SIGNED 04/15/2012 12:15

## 2014-06-09 NOTE — Consult Note (Signed)
Brief Consult Note: Diagnosis: pancreatitis.   Patient was seen by consultant.   Consult note dictated.   Comments: Appreciate consult for 54 y/o caucasian woman with history of RA, htn, CKD stage 3, cholecystectomy, for evaluation of pancreatitis. Patient recently admitted for same 2w ago and resolved. Also underwent EGD at the time with the finding of errosive gastropathy. Has fu appt with GU clinic 03/30/12. Evidentally she was doing well at home until the lst 2d, when she developed epigastric pain/nausea with eating. Her lipase on admission was 687. Abdominal US today was unremarkable. She denies etoh, so a lipid panel is ordered for the am. Feeling some better presently. Receiving rehydration through her IV.  Does also report history of loose stools- more so over the last month, about 5-6 loose brown/d. Sometimes wakes at night to defecate Associated with lower abdominal cramping. Increases with spicy/greasy foods and stress. Denies bloody/black/tarry stools & further GI related complaints.  Had colonoscopy last 2006 wtih diverticula, otherwise noted as normal appearing. Impression and plan:  1. Pancreatitis: ensure hydration. Will add IgG4 and ANA to labs in am. Patient denies etoh. In further chart review, there has been a history of prescriptive substance abuse- patient states this is not an issue now. Also noted a history of suicidal ideation/psych hospitalizations related to same and depression/anxiety. Patient currently denying SI- does request psych eval for depression/anxiety related to family issues at home: does not have o/p provider.                                 2. Diarrhea: possibly IBS- but will assess stool studies, including fecal fat, c-diff, culture, ova/parasite. Should keep appt with GI and discuss colonoscopy..  Electronic Signatures: Stephens November H (NP)  (Signed 24-Jan-14 17:11)  Authored: Brief Consult Note   Last Updated: 24-Jan-14 17:11 by Theodore Demark  (NP)

## 2014-06-09 NOTE — Consult Note (Signed)
CC: pancreatitis, she had a spell of SOB last night and iv fluids stopped.  Chest today is clear in posterior fields but she usually takes lasix at home daily so I will give her one dose now.  Lipase down to normal yesterday, will advance to low fat diet.  The stool collection will not be valid since she is not on a regular diet.  Suggest cancel the stoool collection and after she is eating a regular diet do a simple stool smear for fat. Should go home on Actigal and follow up with me in a few weeks.  Electronic Signatures: Scot Jun (MD)  (Signed on 26-Jan-14 11:06)  Authored  Last Updated: 26-Jan-14 11:06 by Scot Jun (MD)

## 2014-06-09 NOTE — Consult Note (Signed)
Chief Complaint:   Subjective/Chief Complaint Though lipase now down to normal, more abd pain and diarrhea last night. Wants to stay for EGD tomorrow. Does not want to advance diet yet.   VITAL SIGNS/ANCILLARY NOTES: **Vital Signs.:   12-Jan-14 07:31   Vital Signs Type Pre Medication   Systolic BP Systolic BP 92   Diastolic BP (mmHg) Diastolic BP (mmHg) 59   Mean BP 70   Brief Assessment:   Cardiac Regular    Respiratory clear BS    Gastrointestinal mild epig tenderness   Lab Results: Routine Chem:  12-Jan-14 05:49    Lipase 339 (Result(s) reported on 29 Feb 2012 at 07:35AM.)   Glucose, Serum 86   BUN 16   Creatinine (comp)  1.43   Sodium, Serum 142   Potassium, Serum  3.2   Chloride, Serum  117   CO2, Serum  14   Calcium (Total), Serum  7.9   Anion Gap 11   Osmolality (calc) 284   eGFR (African American)  48   eGFR (Non-African American)  42 (eGFR values <22m/min/1.73 m2 may be an indication of chronic kidney disease (CKD). Calculated eGFR is useful in patients with stable renal function. The eGFR calculation will not be reliable in acutely ill patients when serum creatinine is changing rapidly. It is not useful in  patients on dialysis. The eGFR calculation may not be applicable to patients at the low and high extremes of body sizes, pregnant women, and vegetarians.)   Assessment/Plan:  Assessment/Plan:   Assessment Pancreatitis. Resolving. Still with abd pain.    Plan NPO after MN for EGD tomorrow. Stay on clears per pt's request. thanks   Electronic Signatures: OVerdie Shire(MD)  (Signed 12-Jan-14 08:45)  Authored: Chief Complaint, VITAL SIGNS/ANCILLARY NOTES, Brief Assessment, Lab Results, Assessment/Plan   Last Updated: 12-Jan-14 08:45 by OVerdie Shire(MD)

## 2014-06-09 NOTE — Consult Note (Signed)
Chief Complaint:   Subjective/Chief Complaint Starting to feel better and hungry. Less abd pain. No nausea. Tolerating clears. Lipase coming down though still elevated.   VITAL SIGNS/ANCILLARY NOTES: **Vital Signs.:   11-Jan-14 08:17   Vital Signs Type Pre Medication   Pulse Pulse 71   Systolic BP Systolic BP 84   Diastolic BP (mmHg) Diastolic BP (mmHg) 54   Mean BP 64   Brief Assessment:   Cardiac Regular    Respiratory clear BS    Gastrointestinal mild epig tenderness   Lab Results: Hepatic:  11-Jan-14 06:06    Bilirubin, Total 0.2   Alkaline Phosphatase 108   SGPT (ALT) 12   SGOT (AST)  10   Total Protein, Serum  6.0   Albumin, Serum  2.7  Routine Chem:  11-Jan-14 06:06    Lipase  655 (Result(s) reported on 28 Feb 2012 at 06:42AM.)   Glucose, Serum 90   BUN  21   Creatinine (comp)  1.60   Sodium, Serum 143   Potassium, Serum  3.1   Chloride, Serum  118   CO2, Serum  14   Calcium (Total), Serum  8.0   Osmolality (calc) 287   eGFR (African American)  42   eGFR (Non-African American)  36 (eGFR values <4m/min/1.73 m2 may be an indication of chronic kidney disease (CKD). Calculated eGFR is useful in patients with stable renal function. The eGFR calculation will not be reliable in acutely ill patients when serum creatinine is changing rapidly. It is not useful in  patients on dialysis. The eGFR calculation may not be applicable to patients at the low and high extremes of body sizes, pregnant women, and vegetarians.)   Anion Gap 11   Assessment/Plan:  Assessment/Plan:   Assessment Pancreatitis. Improving. Possible NSAIDS-induced ulcer disease.    Plan Continue clears rest of today. Recheck lipase in AM. Continue protonix daily. EGD on Monday if patient still here. Thanks   Electronic Signatures: OVerdie Shire(MD)  (Signed 11-Jan-14 08:59)  Authored: Chief Complaint, VITAL SIGNS/ANCILLARY NOTES, Brief Assessment, Lab Results, Assessment/Plan   Last Updated:  11-Jan-14 08:59 by OVerdie Shire(MD)

## 2014-06-09 NOTE — Consult Note (Signed)
Pt CC is pancreatitis.  She also has nocturnal diarrhea which is unusual and usually indicates disease.  Will order stools for culture, C. diff, etc.  May need colonoscopy eventually if studies are neg.  Will follow with you.  Electronic Signatures: Scot Jun (MD)  (Signed on 24-Jan-14 18:58)  Authored  Last Updated: 24-Jan-14 18:58 by Scot Jun (MD)

## 2014-06-09 NOTE — Consult Note (Signed)
PATIENT NAME:  Whitney Bernard, Whitney Bernard MR#:  [id number] DATE OF BIRTH:  [birth year]  DATE OF CONSULTATION:  03/12/2012  CONSULTING PHYSICIAN:  Keturah Barre, NP  PRIMARY CARE PHYSICIAN: Dr. Harl Bowie.  RHEUMATOLOGIST: Dr. Sheppard Penton  I  appreciate consult for a 54 year old woman ordered by Dr. Elpidio Anis to evaluate pancreatitis. The patient has history of RA, HTN, CKD stage III, cholecystectomy. The patient was recently admitted for pancreatitis 2 weeks ago and this resolved. During that admission she also underwent EGD with the findings of erosive gastropathy, has a follow-up appointment with the [facility] 03/30/2012. Evidently was doing well at home until last 2 days when she developed some epigastric pain and nausea with eating. Her lipase on admission was 687. Abdominal ultrasound today was unremarkable. She denies alcohol use. A lipid panel is ordered for the morning to see if this is problematic. She states she is feeling some better presently and is receiving rehydration through her IV. She additionally reports a history of loose stools, more so over the last month, about 5 to 6 loose brown stools a day, sometimes wakes at night to defecate, associated with lower abdominal cramping, increases with spicy or greasy foods and stress. Denies black, bloody, tarry stools and further GI-related complaints. Did have colonoscopy last in 2006 with diverticula, otherwise was noted as normal appearing.   PAST MEDICAL HISTORY: Hypertension, depression, anxiety, gastroesophageal reflux, kidney disease stage III, panic attacks.   ALLERGIES: ALMONDS, NKDA.   SOCIAL HISTORY: Lives at home, 20 pack-year smoking history, none presently, denies alcohol and illicits.   FAMILY HISTORY: Significant for liver cancer. Mother with rheumatoid arthritis, Alzheimer's. Father with cardiac disease. No known colorectal cancer, peptic ulcer disease.   HOME MEDICATIONS:  Norco 1 tab q.6 p.r.n., Paxil 40 mg p.o. daily, Xanax 0.5 mg p.o.  b.i.d., Lasix 40 p.o. daily, potassium 20 mEq p.o. t.i.d., Prilosec 40 mg p.o. daily.    REVIEW OF SYSTEMS:  CONSTITUTIONAL: Significant for weakness and fatigue.  HEENT: Denies blurry vision, ear pain, eye pain, loss of hearing, nasal congestion, difficulty swallowing.  RESPIRATORY: No complaints of shortness of breath, cough, wheeze.  CARDIOVASCULAR: No chest pain, palpitations, syncope, edema, arrhythmias.  GASTROINTESTINAL: As noted above.  GENITOURINARY: No complaints presently of dysuria or polyuria, I do note she has a questionable UTI and has been prescribed some antibiotics.  ENDOCRINE: No history of diabetes or thyroid disorder.  HEMATOLOGY: No anemia, bruising or bleeding.  SKIN: No erythema, lesion or rash.  MUSCULOSKELETAL: Does relate a history of rheumatoid arthritis. Does have a history of mild metacarpal deformity and swelling, follows with Dr. Sheppard Penton.  No changes to musculoskeletal pain.  NEUROLOGIC: No numbness, tingling, dizziness, seizures, history of stroke or TIA PSYCHIATRIC:  Does have a significant psych history with hospitalizations for a suicidal attempt a few years ago as well as depression, questionable bipolar disorder and anxiety. It looks like she was last admitted in 02/2011. She does not follow with any body as an outpatient. Currently she denies SI, HI, although she does state she is having increased family stress with one of her relatives at home and would like a psych evaluation at this time   MOST RECENT LABS: Glucose 117, BUN 37, creatinine 1.34, sodium 141, potassium 4.0, chloride 105, GFR 45, calcium 8.1, lipase 687, albumin 3.3. Otherwise her hepatic panel is normal. WBC 13, hemoglobin 11.1, hematocrit 34, platelet count 351, normocytic red cells with increased RDW. Did undergo ultrasound that shows a post cholecystectomy state with a normal  common bile duct and no other abnormalities.   PHYSICAL EXAMINATION:  VITAL SIGNS: Most recent vital signs:  Temperature 98.2, heart rate 101, respiratory rate 20, blood pressure 121/76, SaO2 97% on room air.  GENERAL: Well appearing, middle-aged Caucasian woman, looks somewhat older than her stated age, appears comfortable, in no acute distress.  HEENT: Normocephalic, atraumatic. Conjunctivae pink, pink mucous membranes. Sclerae anicteric.  NECK: Supple. No thyromegaly, JVD, or lymphadenopathy.  CHEST: Respirations eupneic. Lungs CTAB.  CARDIOVASCULAR: S1, S2, RRR. No MRG. Mild edema to the fingers; however, no appreciable edema elsewhere.  ABDOMEN: Mild epigastric tenderness. Bowel sounds x 4. Soft, nondistended. No guarding, rigidity, peritoneal signs, rebound, tenderness, hepatosplenomegaly or other abnormalities appreciated.  GENITOURINARY: Deferred.  RECTAL: Deferred.  EXTREMITIES: MAEW x 4. Does have very slight deformities to both metacarpal joints bilaterally. No deviation with the fingers. No other appreciable abnormalities. Sensation intact. Strength 5/5. No clubbing or cyanosis.  NEUROLOGIC: Alert, oriented x 3. Cranial nerves II through XII grossly intact. Speech clear. No facial droop.  PSYCHIATRIC: Calm, cooperative, appropriate, pleasant.   IMPRESSION AND PLAN:  1. Pancreatitis. Ensure hydration. We will add IgG 4 and ANA to labs in a.m. as alcohol does not seem to be a problem for her. Also she is denying presently prescriptive drug abuse. 2.  Depression, anxiety. We will order a psychiatric evaluation per patient request. 3.  Diarrhea, possibly irritable bowel syndrome, but will assess stool studies including fecal fat, Clostridium difficile culture, ova parasite. She should also keep her appointment with [facility] and discuss a colonoscopy.   These services were provided by Vevelyn Pat, MSN, NPC in collaboration with Scot Jun, MD.  ____________________________ Keturah Barre, NP chl:jm D: 03/12/2012 17:25:51 ET T: 03/12/2012 18:10:51  ET JOB#: [id number]  cc: Keturah Barre, NP, <Dictator> Eustaquio Maize Tammela Bales FNP ELECTRONICALLY SIGNED 03/16/2012 15:14

## 2014-06-09 NOTE — Discharge Summary (Signed)
DATE OF BIRTH:  [birth year]  DATE OF ADMISSION:  04/11/2012  ADMITTING PHYSICIAN:  Dr. Imogene Burn  DATE OF DISCHARGE:  04/15/2012  DISCHARGING PHYSICIAN:  Dr. Enid Baas  PRIMARY PHYSICIAN:  Dr. Cay Schillings   CONSULTATIONS IN THE HOSPITAL:   GI consultation by Dr. Niel Hummer.   DISCHARGE DIAGNOSES: 1.  Acute gastroenteritis.  2.  Chronic diarrhea and lower abdominal pain, likely irritable bowel syndrome.  3.  Depression and anxiety.  4.  Hypertension.  5.  Chronic kidney disease stage III.  DISCHARGE MEDICATIONS:   1.  Potassium chloride 20 mEq p.o. daily.  2.  Percocet 5/325 mg 1 tablet every 8 hours as needed for pain.  3.  Tramadol 50 mg p.o. q.6 hours p.r.n.  4.  Paroxetine 40 mg p.o. daily.  5.  Atropine diphenoxylate (Lomotil) 1 tablet q.6 hours p.r.n. for diarrhea.  6.  Xanax 0.5 mg b.i.d.  7.  Dicyclomine 10 mg 3 times a day as needed for abdominal pain. 8.  Actigall 30 mg p.o. b.i.d.  9.  Probiotic capsule 3 times a day.   DISCHARGE DIET:  Low sodium diet.   DISCHARGE ACTIVITY:  As tolerated.   FOLLOWUP INSTRUCTIONS: 1.  Follow up with Dr. Bluford Kaufmann, gastroenterologist, in 5 days, appointment already scheduled with Parks Neptune, PA, on March 5 at 9:30 a.m.  2.  PCP followup in 1 to 2 weeks.   LABS AND IMAGING STUDIES PRIOR TO DISCHARGE:   WBC is 10.0, hemoglobin 11.8, hematocrit 35.9, platelet count 337. Sodium 143, potassium 4.1, chloride 117, bicarb 18, BUN 3, creatinine 1.1, glucose 97, calcium 7.7.  Stool for C. diff is negative. Magnesium 1.5.   CT of the abdomen and pelvis without contrast showing moderate amount of gas, no evidence of obstruction. No evidence of colitis or diverticulitis, no acute hepatobiliary or urinary tract abnormality. Patchy bibasilar atelectasis in both lungs.   BRIEF HOSPITAL COURSE:  The patient is a 54 year old Caucasian female with a history of anxiety and depression, multiple UTIs, gastroesophageal reflux disease and CKD stage III, who  has been having chronic diarrhea recently, presents to the hospital with abdominal pain and worsening of her diarrhea.      Likely acute gastroenteritis on top of chronic diarrhea. She was seeing Dr. Bluford Kaufmann as an outpatient. She was already treated with Cipro and Flagyl. She has been having diarrhea for the past one month. Her stool for C. diff has been negative in the hospital. CT was negative for any colitis or diverticulitis. She was on Flagyl in the hospital, which was stopped. Probably she did have acute gastroenteritis. She was seen by GI in consultation. Dr. Tyna Jaksch  felt that patient probably has irritable bowel syndrome, and he did send some labs, which are pending at this time. The patient will need an outpatient colonoscopy. She is already scheduled to see Dr. Bluford Kaufmann as an outpatient in 5 days. Because of her acute diarrhea while in the hospital, she did have electrolyte abnormalities with low potassium and magnesium, and those were replaced appropriately. She has chronic cramping abdominal pain, probably IBS-related, so was just started on Bentyl here in the hospital. She has been requesting for Percocet, and a  few pills have been dispensed at this time. She is also on Lomotil p.r.n. for her diarrhea. She is on also Ursodiol and probiotics as well. Her other home medications were continued for her depression/anxiety at this time. Her course has been otherwise uneventful in the hospital.   DISCHARGE CONDITION: Stable.  DISCHARGE DISPOSITION: Home.   Time spent on discharge is 45 minutes.      ____________________________ Enid Baas, MD rk:mr D: 04/15/2012 15:49:00 ET T: 04/15/2012 19:11:56 ET JOB#: [id number]  cc: Enid Baas, MD, <Dictator> Mickie Hillier. Sheppard Penton, MD Ezzard Standing. Bluford Kaufmann, MD   Enid Baas MD ELECTRONICALLY SIGNED 04/16/2012 13:46

## 2014-06-09 NOTE — Consult Note (Signed)
Brief Consult Note: Diagnosis: Diarrhea.   Patient was seen by consultant.   Comments: Patient with recurrent diarrhea for more than a month. Cultures negative. C. diff toxin negative. CT unremarkable. Etiolgy is not clear.  Recommendations: DC PO Magnesium due to its laxative effects. IBD and Celiac disease panel. Continue brobiotics. Will follow.  Electronic Signatures: Lurline Del (MD)  (Signed 25-Feb-14 19:40)  Authored: Brief Consult Note   Last Updated: 25-Feb-14 19:40 by Lurline Del (MD)

## 2014-06-09 NOTE — Consult Note (Signed)
Overall better. Less abd pain. Lipase normal. EGD showed possible erosion in prepyloric region. Bx taken. Low fat diet ordered. Have patient stay on low fat diet rest of week. Daily protonix or PPI for now. Ok for discharge if tolerates solids. Will sign off. Thanks.  Electronic Signatures: Lutricia Feil (MD)  (Signed on 13-Jan-14 13:23)  Authored  Last Updated: 13-Jan-14 13:23 by Lutricia Feil (MD)
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
## 2014-06-09 NOTE — H&P (Signed)
PATIENT NAME:  Whitney Bernard, Whitney Bernard MR#:  [id number] DATE OF BIRTH:  Mar 01, 1960  DATE OF ADMISSION:  03/12/2012  PRIMARY CARE PHYICIAN:  Dr. Corky Downs.   REFERRING PHYSICIAN:  Dr. Lucrezia Europe.   CHIEF COMPLAINT:  Abdominal pain, nausea.   HISTORY OF PRESENT ILLNESS:  This is a 54 year old female with significant past medical history of hypertension, gastroesophageal reflux disease, anxiety, depression, chronic kidney disease stage III, recently discharged from [facility] with diagnosis of acute pancreatitis, presents with complaints of abdominal pain and nausea, the patient reports over the last 48 hours she started to develop some epigastric abdominal pain, with some nausea, denies any vomiting, any chest pain, any coffee-ground emesis, any bright red blood per rectum, had some episodes of diarrhea, the patient during her last hospital stay had EGD, which did show old healing peptic ulcer disease, otherwise was nonsignificant.  Upon discharge, patient's lipase level went back to normal at 191, today's lipase level was at 687, the patient's epigastric pain much improved after she received morphine, the patient reports that over the last two days she could not tolerate a lot of by mouth intake, as she was mainly on Jell-O and soup due to her pain, as well patient's urinalysis was found to be positive, as well she did report some complaints of dysuria and polyuria, she denies any fever or chills, hospitalist service were requested to admit the patient for further management and work-up of her pancreatitis.   PAST MEDICAL HISTORY: 1.  Hypertension.  2.  Anxiety.  3.  Gastroesophageal reflux disease.  4.  Chronic kidney disease stage III.   5.  Depression.   ALLERGIES:  ALMOND, BUT NOT TO MEDICATION.   SOCIAL HISTORY:  No smoking.  Currently she used to smoke in the past, has a history of 20 pack-year smoking.  No alcohol abuse.  No illicit drug use.   FAMILY HISTORY:  Significant  for Alzheimer's dementia in her mother and cardiac disease in the father.   HOME MEDICATIONS: 1.  Norco 325/5 mg 1 tablet every 6 hours as needed.  2.  Paroxetine 40 mg oral daily. 3.  Xanax 0.5 mg by mouth twice daily.  4.  Lasix 40 mg oral daily.  5.  Potassium 20 mEq 3 times a day.  6.  Prilosec 40 mg oral daily.   REVIEW OF SYSTEMS:  CONSTITUTIONAL:  The patient denies any fever or chills.  Complains of generalized weakness and fatigue.  EYES:  Denies blurry vision, double vision or inflammation or glaucoma.  EARS, NOSE, THROAT:  Denies tinnitus, ear pain, hearing loss, epistaxis, discharge.  RESPIRATORY:  Denies any cough, wheezing, hemoptysis, dyspnea, COPD.  CARDIOVASCULAR:  Denies chest pain, palpitations, syncope, edema, arrhythmia, orthopnea.  GASTROINTESTINAL:  Has complaints of nausea, diarrhea, epigastric abdominal pain.  Denies any vomiting, constipation, hematemesis, jaundice, rectal bleed, bright red blood per rectum.  GENITOURINARY:  Complains of dysuria, polyuria.  Denies hematuria, renal colic.  ENDOCRINE:  Denies polyuria, polydipsia, heat or cold intolerance.  HEMATOLOGY:  Denies anemia, easy bruising, bleeding diathesis.  INTEGUMENT:  Denies acne, rash or skin lesions.  MUSCULOSKELETAL:  Denies pain, neck pain, shoulder pain, redness, gout or cramps.  NEUROLOGIC:  Denies numbness, dysarthria, epilepsy, tremors, vertigo, CVA or TIA.  PSYCHIATRIC:  Denies anxiety, insomnia, bipolar disorder, depression or schizophrenia.   PHYSICAL EXAMINATION: VITAL SIGNS:  Temperature 99.1, pulse 91, respiratory rate 18, blood pressure 97/64, saturating 97% on room air.  GENERAL:  Well-nourished female, looks comfortable  and in no apparent distress.  HEENT:  Head atraumatic, normocephalic.  Pupils equal, reactive to light.  Pink conjunctivae.  Anicteric sclerae.  Moist oral mucosa.  NECK:  Supple.  No thyromegaly.  No JVD.  CHEST:  Good air entry bilaterally.  No wheezing, rales,  rhonchi.  CARDIOVASCULAR:  S1, S2 heard.  No rubs, murmur, gallops.  ABDOMEN:  Mild epigastric tenderness to palpation.  No rebound, no guarding.  Bowel sounds present.  No hepatomegaly.  EXTREMITIES:  No edema.  No clubbing.  No cyanosis.  PSYCHIATRIC:  Appropriate affect.  Awake, alert x 3, pleasant.  NEUROLOGIC:  Cranial nerves grossly intact.  Motor V/V.  No focal deficits.   PERTINENT LABORATORY DATA:  Glucose 117, BUN 37, creatinine 1.34, sodium 141, potassium 4, chloride 105, CO2 25, alkaline phosphatase 125, AST 33, ALT 34.  White blood cells 13, hemoglobin 11.1, hematocrit 34, platelets 351.  Urinalysis showing leukocyte esterase +2, 29 white blood cell and bacteria trace.   ASSESSMENT AND PLAN: 1.  Acute recurrent pancreatitis, the patient was recently discharged from [facility], where her lipase has normalized, currently is elevated at 687, etiology was unclear then, we will repeat abdominal ultrasound to evaluate for the pancreas and the bile duct and GB fossa, and patient did not have her lipid profile checked last time so we will check lipid profile to see if that is contributing to her recurrent pancreatitis, she denies any alcohol use, she will be kept on as needed pain and nausea medication.  She will be kept on aggressive hydration, will be kept on clear liquid diet, advanced as tolerated.  2.  Urinary tract infection.  We will start patient on Rocephin.  We will follow on blood cultures.  3.  Chronic kidney disease, the patient's creatinine seems to be improving, she reports she is taking her Lasix, but she has been off her lisinopril.  We will monitor and we will decide prior to discharge if to resume lisinopril or not.  4.  Hypertension, currently blood pressure on the lower side.  The patient seems to be off her lisinopril and atenolol at this point, if blood pressure becomes elevated we will resume.  5.  Depression.  Continue with Paroxetine.  6.  Anxiety.   Continue with Xanax.  7.  Gastroesophageal reflux disease.  Continue with Prilosec.  8.  Deep vein thrombosis prophylaxis.  Subcutaneous heparin.  9.  GI prophylaxis.  On PPI.   CODE STATUS:  FULL CODE.   TOTAL TIME SPENT ON ADMISSION AND PATIENT CARE:  Fifty minutes.    ____________________________ Starleen Arms, MD dse:ea D: 03/12/2012 05:43:56 ET T: 03/12/2012 06:31:54 ET JOB#: [id number]  cc: Starleen Arms, MD, <Dictator> Natallie Ravenscroft Teena Irani MD ELECTRONICALLY SIGNED 03/14/2012 0:18

## 2014-06-09 NOTE — Discharge Summary (Signed)
PATIENT NAME:  Whitney Bernard, Whitney Bernard MR#:  [id number] DATE OF BIRTH:  [birth year]  DATE OF ADMISSION:  02/27/2012 DATE OF DISCHARGE:  03/01/2012   ADMITTING PHYSICIAN:  Hubbard Hartshorn, MD  DISCHARGING PHYSICIAN:  Enid Baas, MD   CONSULTATIONS IN THE HOSPITAL: GI consultation by Lutricia Feil, MD  PRIMARY CARE PHYSICIAN:  Corky Downs, MD  DISCHARGE DIAGNOSES: 1.  Acute pancreatitis.  2.  History of gastroesophageal reflux disease and gastritis.  3.  Hypertension.  4.  Chronic kidney disease stage III. Baseline creatinine 1.5.  5.  Anxiety.  DISCHARGE HOME MEDICATIONS:  1.  Paxil 40 mg p.o. daily.  2.  Prilosec 40 mg p.o. daily.  3.  Atenolol 25 mg p.o. daily.  4.  Xanax 0.5 mg p.o. b.i.d.  5.  Norco 5/325 mg 1 tablet q. 6 hours p.r.n. for pain.  6.  Zofran 4 mg p.o. q. 6 hours p.r.n. for nausea, vomiting.  7.  The patient advised to hold her Lasix, potassium chloride and lisinopril medications.   DISCHARGE DIET: Advised to eat light for the first meal, low residue diet.  DISCHARGE ACTIVITY: As tolerated.  FOLLOW-UP INSTRUCTIONS:  1.  The patient will follow-up in 1 to 2 weeks for blood pressure management. 2. GI follow-up in 2 to 3 weeks.    LABS AND IMAGING STUDIES PRIOR TO DISCHARGE: Lipase 191. Sodium is 142, potassium 3.2, chloride 117, bicarbonate 14, BUN 16, creatinine 1.43 glucose 86, calcium 7.9.   ALT is 12, AST 10, alkaline phosphatase 108, total bilirubin 0.2, albumin 3.7.  Urinalysis negative for any infection. WBC 12.0, hemoglobin 13.3, hematocrit 40.9, platelet count 389.   CT of the abdomen and pelvis showing scattered colonic diverticulosis without evidence of acute diverticulitis. Her lipase was elevated at 1067 on admission.   BRIEF HOSPITAL COURSE: The patient is a 54 year old elderly Caucasian female with past medical history significant for gastritis, depression, anxiety and hypertension. Presented to the hospital secondary to abdominal pain, nausea and vomiting.  She was found to have an elevated lipase. CT of the abdomen was otherwise negative for any acute changes.  1.  Acute pancreatitis. Not sure what triggered the episode. She does not drink any alcohol.  2.  Lipase was elevated. CT of the abdomen was otherwise negative. She was kept n.p.o., started on IV fluids, received pain medications and nausea medications p.r.n. Seen by GI because she was having persistent epigastric pain and prior history of reflux disease. She had an upper GI endoscopy done, which just showed old healing ulcer in the prepyloric region; otherwise negative.  She was advised to continue her PPI daily at this time and also stay on low fat diet and follow up with GI as an outpatient. Her diet was being advanced to solid diet prior to discharge.  3.  CKD stage III.  Her blood pressure was on the lower side while in the hospital, so ACE inhibitor and Lasix were held. Creatinine was stable at the time of discharge.  4.  Hypertension. Low normal blood pressure. Continue atenolol at time of discharge. Lisinopril and Lasix are being held and she can follow up with her PCP at discharge.  5.  Depression and anxiety. Continue Paxil and Xanax.  Her course has been otherwise uneventful in the hospital.   DISCHARGE CONDITION: Stable.   DISCHARGE DISPOSITION: Home.   TIME SPENT ON DISCHARGE: 45 minutes     ____________________________ Enid Baas, MD rk:cc D: 03/01/2012 14:20:58 ET T: 03/01/2012 16:45:09 ET JOB#: [id number]  cc: Enid Baas, MD, <Dictator> Corky Downs, MD Enid Baas MD ELECTRONICALLY SIGNED 03/10/2012 19:30

## 2014-06-09 NOTE — Consult Note (Signed)
PATIENT NAME:  Whitney Bernard, Whitney Bernard MR#:  [id number] DATE OF BIRTH:  1960/04/21  DATE OF CONSULTATION:  02/27/2012  REFERRING PHYSICIAN:  Abel Presto, MD CONSULTING PHYSICIAN:  Verdie Shire, MD / Payton Emerald, NP  PRIMARY CARE PHYSICIAN: Cletis Athens, MD  REASON FOR CONSULTATION: Nausea, epigastric pain and abdominal pain.  HISTORY OF PRESENT ILLNESS: Whitney Bernard is a 55 year old Caucasian female who presented to [facility] Emergency Room with a one-week history of epigastric discomfort which is worsened by eating food and/or liquids such as ginger ale this morning as well as movement. The patient states one week ago onset of nausea which then was followed by epigastric abdominal discomfort which has radiated through to her back at times, no vomiting. For the past 2 to 3 months though she has been experiencing water brash, symptoms of reflux. Husband present who feels that her symptoms have been more in correlation and onset when she was placed on atenolol 25 mg daily for her blood pressure. Atenolol was held this morning as her blood pressure was 84/55. The patient has had poor caloric intake over the past week. She has had about a 10 pound weight loss in association with her symptoms. She was found to have acute renal failure on admission with a creatinine up to at least 2.2, base being around normally 1.3 to 1.4. CT scan of abdomen and pelvis without contrast revealed no acute pathological, etiological cause of her symptoms. Due to acute renal failure and dehydration with associated GI symptoms as noted, she was admitted to the hospital for further evaluation. She has had a cough, nonproductive. Afebrile. Since this past Sunday she has noticed a change in bowel habits experiencing diarrhea, bowels moving 3 to 4 times a day. Diarrhea started when the nausea did. No recent antibiotic therapy. No foreign travel. No unusual foods that she has eaten such as restaurant. No melena. No bright red blood per rectum. The patient  does have a history of colonoscopy as well as an upper endoscopy which was performed by Dr. Rhona Leavens, 09/30/2004. Colonoscopy revealed evidence of diverticulosis. Upper endoscopy revealed evidence of normal stomach, normal duodenum and suspected Barrett's esophagus although pathology did not reveal that. Did reveal chronic nonspecific inflammation and squamous gastric type mucosa.   HOME MEDICATIONS: 1. Atenolol 25 mg daily. 2. Furosemide 40 mg once a day. 3. Lisinopril 10 mg once a day. 4. Paroxetine 40 mg once a day.  5. Potassium chloride 20 milliequivalents 1 tablet 3 times a day. 6. Prilosec 40 mg once a day. 7. Ibuprofen 200 mg tablets taking 4 to 6 tablets daily as needed, chronic basis.   ALLERGIES: ALMONDS. NO MEDICATION ALLERGIES.  PAST MEDICAL HISTORY:  1. High blood pressure. 2. Edema.  3. Depression/anxiety. 4. Recurrent urinary tract infection.  5. Irritable bowel syndrome, diagnosed in 1980s.   PAST SURGICAL HISTORY:  1. Cholecystectomy, 2003. 2. Cervical laminectomy, 2002.  FAMILY HISTORY: [employer] Brothers on paternal side of the family with history of breast cancer. Uncle and cousins, maternal side of the family, history of liver cancer. Aunt, paternal side of family, history of leukemia. No family history of colon cancer. Family history of diabetes as well as heart disease involving her father. Mother hypertension and rheumatoid arthritis, deceased.   SOCIAL HISTORY: Remote tobacco use, quit in 1986. No alcohol. Married.   REVIEW OF SYSTEMS:   GENERAL: Significant for 10 pound weight loss, unintentionally, over the past several weeks. Afebrile.   HEENT: Denies any chronic eye redness, iritis, oral  ulcers or hoarseness.   CARDIOVASCULAR: No chest pain. No shortness of breath or syncopal episodes.   PULMONARY: Significant for cough, nonproductive. Denies any shortness of breath or wheezing.   GASTROINTESTINAL: See HPI.   GENITOURINARY: History of recurrent urinary  tract infections which is been better controlled at this time. Denies any dysuria or hematuria.   MUSCULOSKELETAL: Denies any generalized neuralgias or myalgias.   SKIN: No rashes, jaundice or generalized itching.   PSYCH: Significant for mild depression and anxiety.   HEME/LYMPH: Denies any lymphadenopathy, significant easy bruising or bleeding.  EXTREMITIES: Known history of bilateral lower extremity edema. No cyanosis.   PHYSICAL EXAMINATION:  VITAL SIGNS: Temperature is 98.1, pulse 74, respirations 20 and blood pressure is 84/55 with a pulse oximetry of 96%.   GENERAL: Well-developed, well-nourished 54 year old Caucasian female, in no acute distress noted. Pleasant. Appears to be resting comfortably in bed.   HEENT: Normocephalic, atraumatic. Pupils equal and reactive to light. Conjunctivae clear. Sclerae anicteric.   NECK: Supple. Trachea midline. No lymphadenopathy or thyromegaly.   PULMONARY: Symmetric rise and fall of chest. Clear to auscultation throughout.   CARDIOVASCULAR: Regular rhythm. S1 and S2. No murmurs. No gallops.   ABDOMEN: Soft, nondistended. Bowel sounds in four quadrants. Mild discomfort epigastric, left upper quadrant. Currently rating pain as a 5 on a scale of 1 to 10. Just received morphine, 1 mg IV push, prior to examination. No bruits. No masses.   RECTAL: Deferred.   MUSCULOSKELETAL: Moving all four extremities. No contractures. No clubbing.   EXTREMITIES: No edema.   SKIN: Warm, dry. No lesions. No rashes.   NEUROLOGICAL: No gross neurological deficits.   LABS/RADIOLOGIC STUDIES: Chemistry panel on admission: Glucose 111, BUN 32, creatinine was 2.21, chloride elevated at 112 and CO2 low at 16. eGFR low at 25 and has improved, today's date, to 31. Calcium has declined from 9.1 to 8.4. Lipase elevated at 1067. BUN elevated at 27. Today creatinine is 1.82. Potassium has dropped from 3.9 to 3.2. Hepatic panel: Total protein 8.2, AST is 13 and ALT is  10. Cardiac enzymes, first in series, within normal limits. CBC: WBC count is 12.0 with RDW of 15.4, otherwise CBC within normal limits.   Urinalysis: Leukocytes +1.   EKG: Normal sinus rhythm.  CT scan of abdomen and pelvis without contrast revealed minimal scattered colonic diverticulosis without evidence of acute diverticulitis.   ASSESSMENT AND PLAN:  The patient's presentation was discussed with Dr. Verdie Shire. Care plan will follow in addendum form.   These services provided by Payton Emerald, MS, APRN, [facility], FNP under collaborative agreement with Verdie Shire, MD. ____________________________ Payton Emerald, NP dsh:sb D: 02/27/2012 12:57:59 ET T: 02/27/2012 14:16:36 ET JOB#: [id number]  cc: Payton Emerald, NP, <Dictator> Payton Emerald MD ELECTRONICALLY SIGNED 03/04/2012 17:23

## 2014-06-09 NOTE — Consult Note (Signed)
Chief Complaint:  Subjective/Chief Complaint Feels better. Diarrhea improved. Tolerating regular diet.  Agree with probable DC in am. Patient has an appointment to follow up with Dr. Bluford Kaufmann for OP colonoscopy.   Electronic Signatures: Lurline Del (MD)  (Signed [phone] 16:29)  Authored: Chief Complaint   Last Updated: 26-Feb-14 16:29 by Lurline Del (MD)

## 2014-06-09 NOTE — Consult Note (Signed)
PATIENT NAME:  Whitney Bernard, Whitney Bernard MR#:  [id number] DATE OF BIRTH:  25-Oct-1960  DATE OF CONSULTATION:  03/13/2012  REFERRING PHYSICIAN:   CONSULTING PHYSICIAN:  Woodson Macha K. Guss Bunde, MD  PLACE OF DICTATION:  [facility] room #125, [place], [place].  AGE:  54 years.  SEX:  Female.  RACE:  White.  HISTORY OF PRESENT ILLNESS:  The patient was seen in consultation, room #125.  Staff reports that the patient was admitted for upper GI problems.  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  Today she is admitted for evaluation of pancreatitis.  The patient reports that Dr. Mechele Collin told her that there are stones in her pancreas which she has to pass and these are the cause of her physical illness and pain.    PAST PSYCHIATRIC HISTORY:  The patient reports that she was admitted to psychiatry for depression and anxiety and had one suicide attempt when she overdosed on pills.  Currently she was stabilized on Paxil 20 mg twice a day and Xanax 0.5 mg twice a day which she gets from her primary care physician.  The patient reports that she has been feeling depressed recently, since she lost her mother in November of 2013 and calls her mother as her best friend and she was very close to her.  A smile while talking about her relationship with the mother.    ALCOHOL AND DRUGS:  Denied.  Denies smoking nicotine cigarettes.    MENTAL STATUS EXAMINATION:  The patient is seen lying comfortable in bed.  Dressed in hospital clothes.  Alert and oriented to place, person, time.  Fully aware of situation brought her for admission to [facility]  Cognition is intact.  Affect is appropriate with her mood which is lowering down.  Does admit feeling hopeless and helpless and worthless sometimes because she was a very independent person and currently she has to depend on others and not able to function in her  capacity.  No psychosis.  Memory is intact.  Admits that she misses her mother very much and has been feeling depressed since she lost her mother in November of 2013.  Denies any ideas of plans to hurt herself, others and said that medications help her with the same.  Insight and judgment fair.    IMPRESSION:  Major depressive disorder, recurrent.  Uncomplicated bereavement secondary to loss of her mother in November 2013.    RECOMMENDATIONS:  Continue current medications which is Paxil 20 mg twice a day which has been helping her and Xanax 0.5 mg twice a day which has been helping her.  The patient refuses to go for counseling as she feels that she took counseling in the past and she feels that it is of no help and she wants to work through her depression and loss of her mother with her own coping skills.      ____________________________ Jannet Mantis. Guss Bunde, MD skc:ea D: 03/13/2012 19:27:43 ET T: 03/14/2012 05:33:49 ET JOB#: [id number]  cc: Monika Salk K. Guss Bunde, MD, <Dictator> Beau Fanny MD ELECTRONICALLY SIGNED 03/14/2012 19:18

## 2014-06-10 NOTE — Op Note (Signed)
PATIENT NAME:  Whitney Bernard, Whitney Bernard MR#:  [id number] DATE OF BIRTH:  [birth year]  DATE OF PROCEDURE:  05/25/2013   PREOPERATIVE DIAGNOSIS: Unresponsive, hypotensive arrest.   POSTOPERATIVE DIAGNOEIS:   1.  Unresponsive, hypotensive arrest.  2.  Lack of IV access.  3.  Possible sepsis.   PROCEDURE: Ultrasound-guided insertion of central venous catheter.   SURGEON: Quentin Ore III, MD  ANESTHESIA: Local.   OPERATIVE PROCEDURE: With the patient in the supine position, the patient's right neck was prepped with ChloraPrep and draped with sterile towels. The right neck was interrogated with the sterile ultrasound probe and the jugular vein appear to be compressible and patent. The area was infiltrated with 1% Xylocaine for anesthesia. Using the ultrasound-guided visualization of the vein, the vein was cannulated in a single pass and a wire passed into the great vessel system without difficulty. Skin incision was slightly enlarged and dilator placed over the wire into the great vessel system. Triple-lumen catheter was inserted over the wire, again into the great vessel system. The catheter was secured, sterilely dressed, flushed and aspirated. It was capped. Chest x-ray is pending.   ____________________________ Carmie End, MD rle:cs D: 05/25/2013 18:59:14 ET T: 05/25/2013 19:19:58 ET JOB#: [id number]  cc: Quentin Ore III, MD, <Dictator> Quentin Ore MD ELECTRONICALLY SIGNED 05/26/2013 17:11

## 2014-06-10 NOTE — Consult Note (Signed)
PATIENT NAME:  Whitney Bernard, POTTEIGER MR#:  [id number] DATE OF BIRTH:  19-Feb-1960  DATE OF CONSULTATION:  05/23/2013  REFERRING PHYSICIAN: Alfonse Flavors, MD CONSULTING PHYSICIAN:  Ardeen Fillers. Garnetta Buddy, MD  REASON FOR CONSULTATION: "My husband is trying to get everything I got."   HISTORY OF PRESENT ILLNESS: The patient is a 54 year old married Caucasian female who presented to the ED under involuntary commitment as it was petitioned by her husband. The patient has a long history of ongoing problems with drugs. She has just filled a prescription of Soma 50 tablets prescribed by her primary care physician, Dr. Juel Burrow. She has taken approximately 45 to 50 pills and started acting weird. Her husband reported that she gets very angry, and then she took a knife trying to hurt herself. Her husband took the knife from her and then she went into the bedroom and tried to take an overdose of trazodone. Her husband  asked her to spit them out. The patient has also been taking lisinopril.   During my interview, the patient reported that she has been married for the past 29 years and she has not done anything wrong. She took some pills including butalbital, which was prescribed by Dr. Juel Burrow for her migraines. She also takes Lasix, potassium, and soma. She reported that she was doing nothing wrong with the knife, maybe cutting some cheese. She stated that she was upset with her husband as they were arguing about their sons. Her 56 year old old son drinks a lot, and when he gets intoxicated he gets out of control. He has been calling her a "bitch". The patient reported that he gets into withdrawal symptoms and becomes irritable. The patient currently denied using any drugs or alcohol. She stated that she has frequent arguments with her husband. She was minimizing her use of alcohol. She reported that she takes all her medications as prescribed and most of them are prescribed by her primary care physician. She appeared disheveled during  the interview and is unable to contract for safety at this time. The patient has multiple previous hospitalizations in the past. She has been  seen last year, as well as admitted to the inpatient psychiatric hospital in 2013. 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 She acknowledges problems with her son's alcoholism as a major stressor in her life. She is currently getting prescribed medications from her primary care physician.   FAMILY PSYCHIATRIC HISTORY: The patient has history of alcoholism in her son. She also thinks that she might be bipolar herself.   PAST MEDICAL HISTORY: Dyslipidemia, congestive heart failure, hypokalemia, rheumatoid arthritis, migraine headaches.   ALLERGIES: ALMONDS    CURRENT MEDICATIONS ON ADMISSION: Potassium 20 mg daily, Lasix 40 mg p.o. b.i.d., Paxil 40 mg daily, Xanax 0.5 mg p.o. b.i.d.   SOCIAL HISTORY: The patient is currently married for the past 29 years. She has 2 sons, ages 8 and 16 years old. She reported that she is currently in disability. She stated that she used to work in [employer] Department  but has lost her job due to her drinking problems.   REVIEW OF SYSTEMS:  CONSTITUTIONAL: She denies any fever or chills. Positive for weight changes.  EYES: No double or blurred vision.  ENT: No hearing loss.  RESPIRATORY: No shortness of breath or cough.  CARDIOVASCULAR: No chest pain or orthopnea.  GASTROINTESTINAL: No abdominal pain, nausea, vomiting, or diarrhea.  GENITOURINARY: No incontinence or frequency.  ENDOCRINE:  No heat or cold intolerance.  LYMPHATIC: No anemia or easy bruising.  INTEGUMENTARY: No acne or rash.  MUSCULOSKELETAL: Complaining of headache as well as body aches.   PHYSICAL EXAMINATION: VITAL SIGNS: Temperature 99, pulse 110, respirations 20, blood pressure 121/77.  LABORATORY DATA: Glucose 139, BUN 31, creatinine 1.67, sodium 138, potassium 4.0, chloride 109,  bicarbonate 21. GFR 40. Anion gap 8, osmolality 284, calcium 8.3. Blood alcohol less than 3. Protein 8.1, albumin 3.7, bilirubin 0.1. alkaline phosphates 130, AST 22, ALT 26. TSH 1.33.  WBC 15, hemoglobin 13.2, hematocrit 40.8, RDW 17.3, Acetaminophen level less than 6.   MENTAL STATUS EXAMINATION: The patient is a moderately built female who appeared her stated age. She appears somewhat disheveled. She maintained fair eye contact. Her mood was depressed, possibly withdrawing from the medications. Affect was congruent. Thought process was logical and goal-directed. Thought content was nondelusional. She currently denied having any suicidal ideations or plans. She denied having any perceptual disturbances. Her language was intact. Fund of knowledge appears appropriate. Cognition grossly intact. She is able to recalls 3/3 objects. Her insight and judgment are poor at this time.   DIAGNOSTIC IMPRESSION: AXIS I:  Major depressive disorder, recurrent, severe, without psychotic features, rule out bipolar disorder.  AXIS II:  Sedative abuse.  AXIS III: Please review the medical history.  AXIS IV: Severe mental illness.  AXIS V: Current global assessment of functioning 25.   TREATMENT PLAN: 1. The patient will be admitted to the inpatient behavioral health unit for stabilization and safety.  2. She will be started back on her current psychotropic medications.  3. She will be monitored by the treatment team, and her medications will be adjusted. Clinical  information be obtained from her outpatient provider.   Thank you for allowing me to participate in the care of this patient.   ____________________________ Ardeen Fillers. Garnetta Buddy, MD usf:sg D: 05/24/2013 11:30:02 ET T: 05/24/2013 11:51:52 ET JOB#: [id number]  cc: Ardeen Fillers. Garnetta Buddy, MD, <Dictator> Rhunette Croft MD ELECTRONICALLY SIGNED 05/26/2013 16:19

## 2014-06-10 NOTE — H&P (Signed)
PATIENT NAME:  Whitney Bernard, Whitney Bernard MR#:  [id number] DATE OF BIRTH:  1961-02-09  DATE OF ADMISSION:  05/25/2013  PRIMARY CARE PROVIDER: Dr. Gaye Pollack.  REFERRING PHYSICIAN: Dr. Jennet Maduro of psychiatry; admitted from behavioral health unit.   CHIEF COMPLAINT: Syncope, hypotension.   HISTORY OF PRESENT ILLNESS: A 54 year old Caucasian female patient with history of hypertension, depression, suicidal attempts, chronic pain syndrome, diastolic versus systolic congestive heart failure, rheumatoid arthritis, and migraines. Presents to the CCU from behavioral health unit after a CODE BLUE was called. First responder was Dr. Margarita Grizzle of Emergency Room. On arrival, the patient did have a pulse, was breathing, was awake and talking, but pressure could not be recorded. An IV was placed and normal saline bolus started, and transferred up to CCU. Presently the patient wakes up on calling her name, although a little drowsy, seems to be alert and oriented. Blood pressure is 60/30 of systolic blood pressure. The patient has received a liter normal saline bolus. EKG has been done, which shows normal sinus rhythm, no acute ST-T wave changes.   The patient was admitted for abusing her Soma, Xanax, and Depakote, although there is no history of overdose at this time, and it has been over 36 hours that patient has been in the hospital. She did receive a dose of Librium, Thorazine, and Depakote in the morning, along with her blood pressure pill of lisinopril  and 20 mg of Lasix that she takes at home.   PAST MEDICAL HISTORY: 1.  Congestive heart failure.  2.  Hypertension.  3.  Migraines.  4.  Dyslipidemia.  5.  Hypokalemia.  6.  Rheumatoid arthritis.  7.  Chronic pain syndrome with abdominal pain and chest pain.   HOME MEDICATIONS: 1.  Paxil 40 mg daily. 2.  Xanax 0.5 mg oral 2 times a day. 3.  Potassium 20 mEq daily.  4.  Lasix 40 mg daily.   ALLERGIES: NSAIDS AND ALMONDS.  SOCIAL HISTORY: The patient has 2 sons.  Married, lives with her husband. Currently on disability. She used to work in [employer] Department in the past, lost her job due to ringing problems.   REVIEW OF SYSTEMS: Unobtainable secondary to the patient's drowsiness, encephalopathy.   PHYSICAL EXAMINATION:  VITAL SIGNS: Heart rate of 76, respirations 15, blood pressure 57/30, saturating 100% on 2 liters of oxygen.  GENERAL: Obese Caucasian female patient lying in bed, drowsy, restless.  HEENT: Atraumatic, normocephalic. Oral mucosa dry and pink. External ears and nose normal. No pallor. No icterus.  NECK: Supple. No thyromegaly. No palpable lymph nodes.  CARDIOVASCULAR: S1, S2, without any murmurs. Peripheral pulses 2+. No edema.  RESPIRATORY: Normal work of breathing. Clear to auscultation on both sides.  GASTROINTESTINAL: Soft abdomen. Tenderness diffusely. No rigidity or guarding. Bowel sounds increased.  SKIN: Warm and dry. No petechiae, rash, ulcers.  MUSCULOSKELETAL: No joint swelling, redness, effusion noticed.  NEUROLOGICAL: Moves all 4 extremities equally.   LABORATORY, DIAGNOSTIC, AND RADIOLOGICAL DATA: Glucose of 131, BUN 42, creatinine 2.20, sodium 135, potassium 3.7, bicarbonate 20. AST, ALT, alkaline phosphatase, bilirubin normal. Troponin less than 0.02.   Recent urine drug screen was positive for barbiturates, benzodiazepines, and TCA.  CBC shows WBC of 14.6, hemoglobin 11.4, platelets of 294.   Urinalysis shows no bacteria, no WBC.   ABG shows pH of 7.36 with pCO2 of 29, pO2 of 161. Lactic acid of 3.   EKG shows normal sinus rhythm. No acute ST-T wave changes.   Chest x-ray showed no  acute abnormalities. Abdominal x-ray showed nothing acute, no obstruction, no free air.   ASSESSMENT AND PLAN: 1.  Severe hypotension in a patient who seems dehydrated with acute renal failure. In spite of giving her bolus of 2 liters normal saline, her blood pressure has not improved. Blood pressure is still in systolic  of 50s. I requested Dr. Michela Pitcher of surgery to place a central line at this time for IV fluid resuscitation. There is no source of infection that can be found at this time, but her white count is elevated at 14,000. We will empirically start her on Zosyn, draw blood and urine cultures. The patient's lactic acid is elevated at 3. Also, the patient will be started on a pressor of Levophed to keep her MAP over 65. We will place a Foley for monitoring of I's and O's in this hemodynamically unstable patient. The patient is presently in CCU, critically ill, high risk for cardiac arrest and death. Her hypotension could also be secondary to medications. I have given her a dose of flumazenil, with no response or change in the patient's status. Her overdose seems to have been more than 36 hours, and it is surprising that her blood pressure would drop secondary to medications at this time.  2.  Acute renal failure secondary to acute tubular necrosis and shock. Monitor I's and O's and creatinine after IV fluid resuscitation.  3.  Chronic pain syndrome. Cannot give any pain medications at this time secondary to her hypotension.  4.  Depression. We will consult psychiatry. She does have an IVC in place. Management per psychiatry.  5.  Mild metabolic acidosis, likely from the acute renal failure and lactic acidosis.  6.  Deep vein thrombosis prophylaxis: On SCDs. No heparin or Lovenox at this time, as we do not have a reason for hypotension, and I have asked stool Hemoccult to be checked to rule out any gastrointestinal bleed. Once this is ruled out, the patient should be started on Lovenox in the morning.   CODE STATUS: Full code.   Time spent today on this critically ill patient was 55 minutes.   ____________________________ Molinda Bailiff Keziah Drotar, MD srs:jcm D: 05/25/2013 19:38:39 ET T: 05/25/2013 21:39:23 ET JOB#: [id number]  cc: Wardell Heath R. Capucine Tryon, MD, <Dictator> Dr. Gaye Pollack Orie Fisherman MD ELECTRONICALLY SIGNED  06/01/2013 17:48

## 2014-06-10 NOTE — Consult Note (Signed)
Brief Consult Note: Diagnosis: Left greater tuberosity fracture (humerus).   Patient was seen by consultant.   Consult note dictated.   Comments: Cold therapy. OK to use sling for comfort. Pain management as per Medicine. Probably just aggravation of previous injury. May need MRI of shoulder depending upon progress over the next 2 weeks. Followup appointment in 2 weeks with [facility].  Electronic Signatures: Donato Heinz (MD)  (Signed 10-Apr-15 17:16)  Authored: Brief Consult Note   Last Updated: 10-Apr-15 17:16 by Donato Heinz (MD)

## 2014-06-10 NOTE — Consult Note (Signed)
Brief Consult Note: Diagnosis: Bipolar II depressed.   Patient was seen by consultant.   Recommend further assessment or treatment.   Comments: Ms. Toulouse has a long h/o mood instability, anxiety, depression and prescription pill abuse. 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 She was petitioned by her husband after OD on Soma and abuse of Fioricet. She was super agitated yesterday. She was started on Librium taper for withdrawal, Thorazine for agitation and depakote for mood stabilization. Her BP was low and HR elevated this am but she had no complaints. She felt week and compalined of chest and abdominal pain. BP dropped, she syncopied, RRT was called.   PLAN: 1. Her bipolar is the least of our worries now. Will hold psychiatric medications.  2. She may be withdrawing from Soma, Xanax and barbiturares and may need benzo taper.  3. She agreed to substance abuse rehab.  4. I will follow along. Will transfer back to BMU when stable.  Electronic Signatures: Kristine Linea (MD)  (Signed 08-Apr-15 18:18)  Authored: Brief Consult Note   Last Updated: 08-Apr-15 18:18 by Kristine Linea (MD)

## 2014-06-10 NOTE — Consult Note (Signed)
PATIENT NAME:  Whitney Bernard, Whitney Bernard MR#:  [id number] DATE OF BIRTH:  [birth year]  DATE OF CONSULTATION:  05/27/2013  REFERRING PHYSICIAN:  Dr. Luberta Mutter CONSULTING PHYSICIAN:  Illene Labrador. Angie Fava., MD  CHIEF COMPLAINT: Left shoulder pain.   HISTORY OF PRESENT ILLNESS: The patient is a 54 year old female who had a history of a fall in 12/2012 sustaining a fracture of the greater tuberosity of the left proximal humerus. She was apparently treated by Dr. Reita Chard with sling and activity modification. She has had some continued pain to the left shoulder. She denies any radiation of pain down the arm. She apparently reported a minor fall on Saturday and states that she has had some increased left shoulder pain since that time. She apparently did not receive any physical therapy after her initial injury and has been modifying her activity due to shoulder discomfort. She denies any numbness. She does report some difficulty with elevation of the arm.   PAST MEDICAL HISTORY: Congestive heart failure, hypertension, migraines, dyslipidemia, hypokalemia, rheumatoid arthritis, chronic pain syndrome, substance abuse status post cervical fusion.   CURRENT MEDICATIONS:  Tylenol 650 mg q.4 hours p.r.n., Percocet 5/325, 1 to 2 tablets p.o. q.4 hours p.r.n. pain, alprazolam 0.25 mg q.8 hours p.r.n. anxiety, Zofran 4 mg IV q.4 hours p.r.n. nausea, Protonix 40 mg p.o. daily, Zosyn 3.375 grams IV piggyback q.8 hours, Soma 350 mg q.12 hours (suspended), Imodium 2 mg q.i.d. p.r.n. diarrhea, Flagyl 500 mg q.8 hours, Paxil 40 mg daily.   ALLERGIES: NSAIDS.   SOCIAL HISTORY: The patient is married and has 2 sons. She is on disability.   FAMILY HISTORY: Noncontributory.  REVIEW OF SYSTEMS: Pertinent musculoskeletal review of systems is as noted above.   PHYSICAL EXAMINATION: GENERAL: The patient is a pleasant, well-developed, well-nourished female seen in no acute distress.  NECK: Supple, nontender. LEFT SHOULDER:  Normal  shoulder contour. Substitution pattern is noted with attempts at elevation of the arm. There is tenderness to palpation on the area of the greater tuberosity, as well as along the insertion of the deltoid. There is guarding noted with active internal and external rotation of the shoulder against resistance. The patient also has difficulty with flexion or abduction of the shoulder above approximately 70 to 80 degrees. Impingement test is equivocal. Good range of motion of the elbow. Good pronation and supination.  NEUROLOGIC: Awake, alert and oriented. Sensory function is grossly intact. Motor strength is felt to be 5/5 with exception of the right shoulder musculature which is as above. Good motor coordination. No clonus or tremor. Good fine motor coordination.   X-RAYS: Radiographs of the left shoulder from [facility] from 05/26/2013 were reviewed. Evidence of greater tuberosity fracture is noted with some fragmentation noted. When compared to films from 12/2012, there is little change in position and actually appears to be some consolidation. No evidence of dislocation.   IMPRESSION: Left greater tuberosity fracture (humerus).   PLAN: Findings were discussed in detail with the patient. This may represent aggravation of the previous injury. I would suggest cold therapy to the area so as to decrease any inflammatory change. A sling may be used for comfort, although the patient actually declines sling at this time. Consideration will be given for MRI of the left shoulder depending upon her progress over the next 2 weeks.   The patient is to be seen in the clinic in 2 weeks.   ____________________________ Illene Labrador. Angie Fava., MD jph:ce D: 05/27/2013 17:27:05 ET T: 05/27/2013 18:11:56  ET JOB#: [id number]  cc: Fayrene Fearing P. Angie Fava., MD, <Dictator> Illene Labrador Angie Fava MD ELECTRONICALLY SIGNED 06/08/2013 11:33

## 2014-06-10 NOTE — Discharge Summary (Signed)
PATIENT NAME:  Whitney Bernard, Whitney Bernard MR#:  [id number] DATE OF BIRTH:  1960/12/28  DATE OF ADMISSION:  05/25/2013 DATE OF DISCHARGE:  05/29/2013  REASON FOR ADMISSION: The patient admitted to [facility] and there underwent a syncopal episode complicated with hypotension.   DISCHARGE DIAGNOSES:  1. Septic shock versus effect of medications.  2. Bronchitis.  3. Anemia, mostly dilutional.  4. Diarrhea, possible acute gastroenteritis.  5. Acute kidney failure.  6. Left shoulder pain since 2014.  7. Left humeral tuberosity fracture, chronic.  8. Anxiety, depression.  9. Tachycardia due to intravascular volume depletion.  10. Hemorrhoidal bleeding.   DISPOSITION: Home.   MEDICATIONS AT DISCHARGE:  1. Xanax 0.5 mg twice daily.  2. Potassium chloride 20 mEq 3 times a day. 3. Lasix 40 mg twice daily. 4. Paxil 40 mg once a day. 5. Zofran 4 mg as needed for nausea.  6. Triamcinolone apply to affected area 2 times a day for psoriasis.  7. Lisinopril 20 mg once a day.  8. Tylenol as needed for pain.  9. Lactobacillus 1 capsule 3 times a day for the next 5 days.  10. Levofloxacin 750 mg once a day for 5 days.  11. Tramadol 50 mg 4 times a day as needed for pain.  12. Albuterol/ipratropium 1 puff 4 times a day for acute bronchitis.   HOSPITAL COURSE: This is a 54 year old female who was admitted to Behavioral Health due to history of depression, previous suicidal attempts, chronic pain syndrome. Code Blue was called on 05/25/2013 due to the patient being unresponsive. She was seen by the Emergency Department physician, who determined that the patient had a pulse, but with significant decrease in blood pressure. The patient had a syncopal episode at the time. Her blood pressure was 60/30, for which IV fluids were given. The patient transferred to the hospital. On her admission orders, the patient was admitted mostly because she has been abusing her Soma, Xanax and apparently Depakote. There was no  history of overdose at the time, and the patient was receiving Librium, Thorazine and Depakote inside the hospital. On top of that, she was taking Lasix and her blood pressure pills. The patient was transferred to the Critical Care Unit. Central line catheter was placed. The patient was found to have an elevation of white blood count of 14,000, for which she was empirically treated with Zosyn. Levophed was started, and the patient started to improve slowly. The patient, on top of that, had acute renal failure due to hypotension, and her chronic pain syndrome was exacerbated.   The patient continued to complain of pain, especially in her shoulder, for which orthopedic consult was obtained. Her pain is chronic, and she had a fracture of the left greater tuberosity of the humerus which is a previous injury that did not require any surgical intervention. The patient was recommended to use a sling for comfort and follow up as an outpatient if needed for possible MRI.   The patient did well during the hospitalization after that, although started to have significant lower abdominal pain. For that, we did a CT scan of the abdomen that showed no diverticulitis, no diverticulosis flare. A wedge-shaped low attenuation on areas along the periphery of the right lobe of the liver which is a consideration of perfusion abnormality versus focal fatty infiltration. There was atelectasis versus infiltrate in the lung bases. The patient again was treated with antibiotics, Zosyn, mostly for sepsis, possible pneumonia, then we changed it to Cipro and metronidazole to  treat possible diverticulitis clinically, since the patient had history of diverticulosis in the past, but since the pain did not relieve, we did CT scan. The CT scan did not show up any major abnormalities, for which the antibiotics were changed back to levofloxacin to cover for the upper respiratory infection alone. So, the patient had mostly acute bronchitis. The CT  scan showed atelectasis, it could be an infiltrate, but clinically, the patient did not have significant pneumonia. It was mostly acute bronchitis from COPD flare. The patient was discharged in good condition.   A long talk occurred with the patient due to her pain medications. She does not want to go back to [facility], and she has been cleared by psychiatry to be discharged from the hospital.   The patient had some bright red blood per rectum, with previous history of hemorrhoids. Her hemoglobin was 11.4 on the 8th, and dropped down to 9.3, but bounced back to 10.2. She has been seen by GI before for these issues. She has followed with Dr. Bluford Kaufmann, and she is due for another colonoscopy outpatient. Since the bleeding was minor and did not occur again, we decided to just discharge her and let her follow up outpatient.   The patient is discharged in really good condition without any major problems.   TIME SPENT: I spent about 45 minutes discharging the patient on the day of discharge.  IMPORTANT RESULTS: White count was up to 20,000, and at discharge is 9.3. Her creatinine went up to 2.2, and at discharge is 1.24.   ____________________________ Felipa Furnace, MD rsg:lb D: 06/01/2013 06:57:49 ET T: 06/01/2013 07:13:57 ET JOB#: [id number]  cc: Felipa Furnace, MD, <Dictator> Corky Downs, MD Pearletha Furl MD ELECTRONICALLY SIGNED 06/05/2013 5:14

## 2014-06-10 NOTE — H&P (Signed)
PATIENT NAME:  Whitney, Whitney Bernard MR#:  [id number] DATE OF BIRTH:  10/31/1960  DATE OF ADMISSION:  05/24/2013  REFERRING PHYSICIAN: Emergency Room M.D.   ATTENDING PHYSICIAN: Kristine Linea, M.D.   IDENTIFYING DATA: Whitney Bernard is a 54 year old female with history of depression, mood instability and prescription pill abuse.   CHIEF COMPLAINT: "I want my pain pill."   HISTORY OF PRESENT ILLNESS:  Whitney Bernard has a long history of depression, mood instability and suicide attempts. She was brought to the Emergency Room by police after her husband petitioned her.  Reportedly, the patient threatened a suicide and overdosed on prescribed medication. She took soma and tried to take a handful of trazodone but her husband prevented her from doing so. The patient has a long history of depression with multiple hospitalizations. She is in the care of Dr. Juel Burrow who prescribes all her medications. She believes that as long as she has access to Xanax, soma and painkillers, she is doing alright.  The husband reports that the patient has been misusing some of her medications. She used 60 tablets of soma in just 3 days. I  have her pharmacy records and she has been misusing Fioricet as well. It was described initially on the 18th of March and again on the 4th of April.  She used 120 pills already.  The patient complains of back and shoulder pain. She accused the Emergency Room nurse of pushing to reinjure her shoulder and now complains of worse pain than when she first came to the Emergency Room. She does not appear to be in pain. She is moving freely. She uses her arm with no problems. The patient reports many symptoms of depression with poor sleep, decreased appetite, leading to 30 pounds weight loss in the past year, anhedonia, feeling of guilt, hopelessness, worthlessness, crying spells, irritability, poor impulse control, social isolation, low energy, and worsening depression needing suicidal ideation. She reports that a couple  of days ago she had thoughts of not wanting to leave but no active suicidal ideation. She also reports heightened anxiety, with constant panic attacks. She gets Xanax 0.5 mg twice daily that she uses on a regular basis, but sometimes needs to use additional Xanax when she has to leave the house. She hardly leaves the house for social anxiety reasons, but also she has diverticulosis and is unable to tolerate many foods that she likes and going out to eat became senseless in her mind. The patient denies psychotic symptoms except for one episode a long time ago when she was on drugs. She denies symptoms suggestive of bipolar mania. She does not use alcohol or illicit drugs, but admits to misusing prescription pills. Her husband strongly believes that the patient needs to go to substance abuse rehab. The patient will consider.   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  It is quite possible that she got sick with bipolar at the same time. She has been hospitalized multiple times at [facility] for depression. She had several suicide attempts by overdose and cutting. She participated in outpatient IOP program here.   FAMILY PSYCHIATRIC HISTORY: Son with alcoholism. There was no completed suicide in the family.   PAST MEDICAL HISTORY: Hypertension, GERD, diverticulosis.   ALLERGIES: Nonsteroidal anti-inflammatories and almonds.     MEDICATIONS ON ADMISSION: Omeprazole 20 mg daily, Xanax 0.5 mg twice daily as needed, and potassium 20 mEq  3 times daily, Lasix 40 mg twice daily, trazodone 100 mg at bedtime, Paxil 40 mg daily.   SOCIAL HISTORY: She lives with her husband. She is disabled from mental illness. She is unhappy in her relationship with her husband and her alcoholic son lives with them. The patient debates whether or not to move out of the house and get her own apartment but she has been  deliberating at least since 2013 and has not made up her mind.   REVIEW OF SYSTEMS:  CONSTITUTIONAL: No fevers or chills. Positive for fatigue.  EYES: No double or blurred vision.  ENT: No hearing loss.  RESPIRATORY: No shortness of breath or cough.  CARDIOVASCULAR: No chest pain or orthopnea.  GASTROINTESTINAL: Positive for abdominal pain associated with dietary restrictions.  GENITOURINARY: No incontinence or frequency.  ENDOCRINE: No heat or cold intolerance.  LYMPHATIC: No anemia or easy bruising.  INTEGUMENTARY: No acne or rash.  MUSCULOSKELETAL: Positive for back and left shoulder pain.  NEUROLOGIC: No tingling or weakness.  PSYCHIATRIC: See history of present illness for details.   PHYSICAL EXAMINATION: VITAL SIGNS: Blood pressure 66/39, pulse 107, respirations 18, temperature 97.9.  GENERAL: This is well-developed, elderly female in no acute distress.  HEENT: The pupils are equal, round, and reactive to light. Sclerae are anicteric.  NECK: Supple. No thyromegaly.  LUNGS: Clear to auscultation. No dullness to percussion.  HEART: Regular rhythm and rate. No murmurs, rubs, or gallops.  ABDOMEN: Soft, nontender, nondistended. Positive bowel sounds.  MUSCULOSKELETAL: Normal muscle strength in all extremities.  SKIN: No rashes or bruises.  LYMPHATIC: No cervical adenopathy.  NEUROLOGIC: Cranial nerves II through XII are intact.   LABORATORY DATA: Chemistries: Blood glucose 139, BUN 31, creatinine 1.67, sodium 138, blood alcohol level is zero. LFTs within normal limits except for alkaline phosphatase of 130. TSH 1.33. Urine tox screen is positive for barbiturates, benzodiazepines and tricyclic antidepressants. CBC is within normal limits except for white blood count of 15. Urinalysis is not suggestive of urinary tract infection. Serum acetaminophen 6. Serum salicylates 2.8.   MENTAL STATUS EXAMINATION ON ADMISSION: The patient is alert and oriented to person, place, time and  situation. She is pleasant, polite and cooperative but yesterday when she arrived on the unit, she was very agitated, loud disruptive, demanding. She is well groomed and casually dressed. She maintains good eye contact. Her speech is of normal rhythm, rate and volume. Mood is depressed with flat affect. Thought process is logical and goal oriented. Thought content: She denies suicidal or homicidal ideation, but was admitted after an intentional overdose on medications. There are no thoughts of hurting others. There are no delusions or paranoia, although the patient thinks that her husband and her children are trying to get rid of her. It is unclear what exactly she means by that. She says that she is frightened of them. She denies auditory or visual hallucinations. Her cognition is grossly intact. She registers 3 out of 3 and recalls 2 out of 3 objects after 5 minutes. She can do serial 7s.  She can spell world forwards and backwards. Her short and long term memory are intact. She is quite intelligent with average fund of knowledge. Her insight and judgment are limited.   SUICIDE RISK ASSESSMENT ON ADMISSION: This is a patient with a long history of depression, anxiety, mood instability and suicidal attempts, who came to the hospital after another suicide attempt in the context of a family conflict and prescription pill abuse. She is at  increased risk of suicide.   INITIAL DIAGNOSES:  AXIS I: Bipolar 2 disorder, panic disorder with agoraphobia generalized anxiety disorder.  AXIS II: Deferred.  AXIS III: Arthritis, dyslipidemia, chronic kidney disease, diverticulosis, congestive heart failure.  AXIS V: Global assessment of functioning 25.   PLAN: The patient was admitted to [facility] Medicine unit for safety, stabilization and medication management. She was initially placed on suicide precautions and was closely monitored for any unsafe behaviors. She underwent full  psychiatric and risk assessment. She received pharmacotherapy, individual and group psychotherapy, substance abuse counseling, and support from therapeutic milieu.  1.  Suicidal ideation. The patient is able to contract for safety.  2.  Mood. We will continue Paxil restarted Depakote. That has been helpful in the past.  3.  Sedative dependence. The patient is positive for benzodiazepine and barbiturates. She also misusing soma. We will put her on a Librium taper to afford symptoms of withdrawal. We will not prescribe Xanax.  4.  Chronic pain. She insists that she has pain killers prescribed in the community by her shoulder doctor, but we were unable to find evidence of it.  A few hydrocodone pills were prescribed in November and December.  No current prescriptions available.  5.  Substance abuse treatment: The patient is not opposed to going to rehab. She has Media planner.  A substance abuse counselor will look into it.  6.  Medical. We will continue treatment of all her medical conditions as in the community.   DISPOSITION: She will be discharged to home or rehab program.   ____________________________ Ellin Goodie. Jennet Maduro, MD jbp:dp D: 05/25/2013 16:12:56 ET T: 05/25/2013 16:59:59 ET JOB#: [id number]  cc: Ibrohim Simmers B. Jennet Maduro, MD, <Dictator> Shari Prows MD ELECTRONICALLY SIGNED 06/05/2013 11:00

## 2014-06-10 NOTE — Consult Note (Signed)
PATIENT NAME:  Whitney Bernard, Whitney Bernard MR#:  [id number] DATE OF BIRTH:  1960-06-25  DATE OF CONSULTATION:  12/04/2013  REFERRING PHYSICIAN:   CONSULTING PHYSICIAN:  Bodin Gorka K. Guss Bunde, MD  PLACE OF DICTATION: [facility] [address] [place], [place].  AGE: [age].  SEX: Female.  RACE: White.  SUBJECTIVE: The patient was seen in consultation at [facility] in [place], [place]. The patient is a 54 year old white female, not employed and is married for 30 years and lives with her husband. The patient has a long history of multiple medical problems which includes chronic low back pain and stomach problems related to diverticulitis. The patient reports that she had lots of pain from diverticulitis and she did not know what to do and so she called the ambulance and in order to come to the hospital she told me that she took too many Xanax pills, which she did not, and the patient reported "I just lied to get here for my stomach problems."  HISTORY OF PRESENT ILLNESS: The patient reports that she was diagnosed with bipolar 2, depressed and she was being followed by [facility] and PCP  and she liked her psychiatrist and related well with him and liked her counselor, and she related very well with her until they discharged her because they did not accept private insurance.   ALCOHOL AND DRUGS: Denied.  MEDICAL ISSUES: The patient has chronic low back pain and has diverticulitis with which she suffers from chronic abdominal pain. She is being followed by Dr. Cleatis Polka who is her primary care physician and her next appointment is on 12/27/2013 and she relates very well with him.  MENTAL STATUS: The patient is seen lying comfortably in bed. Alert and oriented. Calm, pleasant and cooperative. No agitation. Affect neutral and mood stable. Denies feeling depressed. Denies feeling hopeless or helpless. Does not appear to be responding to internal stimuli. No psychosis. Denies suicidal or homicidal plan. Contracts for  safety. Insight and judgment fair. Impulse control is fair.  IMPRESSION: Bipolar 2, currently stable, per medications. Anxiety, stable, not otherwise specified.  PLAN: Discharge, discontinue [facility] involuntary commitment. Discharge the patient to go back home and she realizes that she should keep her followup appointment with Dr. Cleatis Polka and call his office tomorrow, 12/05/2013, asking for an early appointment, which she will because she relates very well with Dr. Cleatis Polka and his office. In relation, the patient will be referred to an appropriate mental health agency which will accept her current private insurance so that she will get help as needed for his bipolar disorder type 2.     ____________________________ Jannet Mantis. Guss Bunde, MD skc:TT D: 12/04/2013 11:55:19 ET T: 12/04/2013 17:58:58 ET JOB#: [id number]  cc: Monika Salk K. Guss Bunde, MD, <Dictator> Beau Fanny MD ELECTRONICALLY SIGNED 12/17/2013 10:45

## 2014-06-11 NOTE — H&P (Signed)
PATIENT NAME:  Whitney Bernard, Whitney Bernard MR#:  [id number] DATE OF BIRTH:  [birth year]  DATE OF ADMISSION:  02/18/2011  REFERRING PHYSICIAN: Daryel November, MD    ATTENDING PHYSICIAN: Kristine Linea, MD   IDENTIFYING DATA: Whitney Bernard is a 54 year old female with a history of depression, mood instability and substance abuse.   CHIEF COMPLAINT: "I overdosed."   HISTORY OF PRESENT ILLNESS: Whitney Bernard was brought to the hospital after she overdosed on six pills of Xanax. She reports that she would have taken more if more were available. She was exasperated with problems that the family has with a 49 year old son who drinks alcohol daily. The father and patient's husband is willing to go every night to pick him up when drunk. The patient feels that they need to "kick him out of the house and show some tough love."  The husband would not have it. She has a long history of depression, has been tried on multiple medications. She felt pretty stable on her current regimen. She also has a vast history of substance abuse, has been in the past using pain killers, muscle relaxants, barbiturates, and Xanax. She denies current substance misuse. She endorses some symptoms of depression with poor sleep, increased anxiety, worries, and at the time of suicide feeling of guilt, hopelessness, and restlessness. She denies any change in energy, memory or concentration.  There are no psychotic symptoms. She denies alcohol use.   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 She has been hospitalized several times at [facility], and the last time she followed up in intensive outpatient substance abuse program. She believes that substance abuse is not an issue anymore. She identifies problems with her son's alcoholism as a major stressor in her life.   FAMILY PSYCHIATRIC HISTORY: None reported. There were no completed suicides in the family. Her son struggles  with alcohol. She thinks at times that he may be bipolar himself.   PAST MEDICAL HISTORY:  1. Dyslipidemia.  2. Congestive heart failure.  3. Hypokalemia.  4. Rheumatoid arthritis.    ALLERGIES: No known drug allergies.   MEDICATIONS ON ADMISSION:  1. Potassium 20 mEq daily.  2. Lasix 40 mg b.i.d.   3. Ambien 5 mg daily.  4. Paxil 60 mg daily.  5. Depakote 500 mg b.i.d.     SOCIAL HISTORY: She has been married for 27 years, is happy in her marriage. It is very upsetting to her that she cannot work as a team with her husband. 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 She applied for Disability for arthritis and bipolar illness. She has a 27 year old son who lives with them and is a drinker. He, however, does keep a job at [employer]. She is upset that she is not able to take care of her mother, who has Alzheimer's, as her brothers placed her placed in a nursing home.    REVIEW OF SYSTEMS: CONSTITUTIONAL: No fevers or chills. Positive for weight gain. EYES: No double or blurred vision. ENT: No hearing loss. RESPIRATORY: No shortness of breath or cough. CARDIOVASCULAR: No chest pain or orthopnea. Positive for leg swelling when off furosemide. GASTROINTESTINAL: No abdominal pain, nausea, vomiting, or diarrhea. GU: No incontinence or frequency. ENDOCRINE: No heat or cold intolerance. LYMPHATIC: No anemia or easy bruising. INTEGUMENTARY: No acne or rash. MUSCULOSKELETAL: Positive for joint pain. NEUROLOGICAL: No numbness or weakness. PSYCHIATRIC: See history of present illness for details.  PHYSICAL EXAMINATION:  VITAL SIGNS: Blood pressure 123/71, pulse 86, respirations 18, temperature 96.5.   GENERAL: This is a well-developed female looking her stated age, in no acute distress.   HEENT: The pupils are equal, round, and reactive to light. Sclerae are anicteric.   NECK: Supple. No thyromegaly.   LUNGS: Clear to auscultation. No dullness to percussion.  HEART: Regular rhythm and rate. No  murmurs, rubs, or gallops.   ABDOMEN: Soft, nontender, nondistended. Positive bowel sounds.   MUSCULOSKELETAL: Normal muscle strength in all extremities. No arthritic changes on her hands visible.   SKIN: No rashes or bruises.   LYMPHATIC: No cervical adenopathy.   NEUROLOGIC: Cranial nerves II through XII are intact. Normal gait.   LABORATORY, DIAGNOSTIC AND RADIOLOGICAL DATA:   Chemistries: Blood glucose 128, BUN 28, creatinine 1.41, sodium 144, potassium 2.8, calcium 8.2. Magnesium 1.4.  LFTs within normal limits except for AST of 47.  Cardiac enzymes are negative.  Depakote level 3.0.  Urine toxicology screen  positive for benzodiazepines.  CBC within normal limits.  Urinalysis is not suggestive of urinary tract infection.  EKG: Normal sinus rhythm, low voltage, abnormal EKG.   MENTAL STATUS EXAMINATION ON ADMISSION: The patient is alert and oriented to person, place, time, and situation. She is slightly oversedated, tearful, uncomfortable, possibly withdrawing from benzodiazepines. She maintains good eye contact. She recognizes me from previous admissions. Her speech is of normal rhythm, rate, and volume. Mood is depressed with anxious and labile, tearful affect. Thought processing is logical and goal oriented. Thought content: She denies suicidal ideations but was admitted after a suicide attempt by benzodiazepine overdose. There are no thoughts of hurting others. There are no delusions or paranoia. There are no auditory or visual hallucinations. Her cognition is grossly intact. She registers three out of three and recalls three out of three objects after five minutes. She can spell world forward and backward. She can do serial sevens. She can name four past Presidents. Her insight and judgment are questionable.   SUICIDE RISK ASSESSMENT ON ADMISSION: This is a patient with a long history of mood instability, depression, anxiety, substance abuse, poor treatment compliance, who was  admitted after an overdose.  DIAGNOSIS:  AXIS I:  1. Major depressive disorder versus bipolar affective disorder.  2. Sedative abuse.   AXIS II: Deferred.   AXIS III:  1. Congestive heart failure.  2. Dyslipidemia.  3. Arthritis.  4. Chronic kidney disease.  5. Hypokalemia.   AXIS IV: Mental illness, substance abuse, treatment compliance, family conflict.   AXIS V: Global Assessment of Functioning score on admission 25.   PLAN: The patient was admitted to [facility] Medicine Unit for safety, stabilization and medication management. She was initially placed on suicide precautions and was closely monitored for any unsafe behaviors. She underwent full psychiatric and risk assessment. She received pharmacotherapy, individual and group psychotherapy, substance abuse counseling, and support from therapeutic milieu.   1. Suicidality: This has resolved. She is able to contract for safety.  2. Mood: We will restart Depakote and continue Paxil.  3. We will not give Xanax, if possible.  4. Medical: We will continue furosemide and potassium. She needs higher doses. We will monitor potassium level.  5. Pain: The patient is a known substance abuser. We will not prescribe narcotic pain killers.  6. Family conflict: She would appreciate an opportunity to have a conference with her husband and the Child psychotherapist.  7. Disposition: She will be discharged to  home.  ____________________________ Ellin Goodie. Jennet Maduro, MD jbp:cbb D: 02/19/2011 17:53:12 ET T: 02/19/2011 18:45:32 ET JOB#: [id number] Shari Prows MD ELECTRONICALLY SIGNED 02/26/2011 23:34

## 2014-06-12 ENCOUNTER — Emergency Department
Admit: 2014-06-12 | Disposition: A | Discharge status: Home / Self Care | Payer: Self-pay | Admitting: Emergency Medicine

## 2014-08-08 ENCOUNTER — Emergency Department
Admission: EM | Admit: 2014-08-08 | Discharge: 2014-08-08 | Discharge status: Against Medical Advice | Payer: Medicare Other | Attending: Emergency Medicine | Admitting: Emergency Medicine

## 2014-08-08 DIAGNOSIS — I10 Essential (primary) hypertension: Secondary | ICD-10-CM | POA: Insufficient documentation

## 2014-08-08 DIAGNOSIS — R031 Nonspecific low blood-pressure reading: Secondary | ICD-10-CM | POA: Insufficient documentation

## 2014-08-08 DIAGNOSIS — Z87891 Personal history of nicotine dependence: Secondary | ICD-10-CM | POA: Insufficient documentation

## 2014-11-19 ENCOUNTER — Other Ambulatory Visit: Payer: Self-pay | Admitting: Cardiology

## 2014-11-19 ENCOUNTER — Ambulatory Visit
Admission: AD | Admit: 2014-11-19 | Discharge: 2014-11-19 | Disposition: A | Discharge status: Home / Self Care | Payer: 59 | Source: Ambulatory Visit | Attending: Cardiology | Admitting: Cardiology

## 2014-11-19 ENCOUNTER — Ambulatory Visit
Admission: RE | Admit: 2014-11-19 | Discharge: 2014-11-19 | Disposition: A | Discharge status: Home / Self Care | Payer: 59 | Source: Ambulatory Visit | Attending: Cardiology | Admitting: Cardiology

## 2014-11-19 DIAGNOSIS — W19XXXA Unspecified fall, initial encounter: Secondary | ICD-10-CM | POA: Insufficient documentation

## 2014-11-19 DIAGNOSIS — S42214A Unspecified nondisplaced fracture of surgical neck of right humerus, initial encounter for closed fracture: Secondary | ICD-10-CM | POA: Diagnosis not present

## 2014-11-19 DIAGNOSIS — S42291A Other displaced fracture of upper end of right humerus, initial encounter for closed fracture: Secondary | ICD-10-CM | POA: Insufficient documentation

## 2014-11-19 DIAGNOSIS — M25511 Pain in right shoulder: Secondary | ICD-10-CM | POA: Diagnosis present

## 2014-12-15 IMAGING — CT CT ABD-PELV W/O CM
1 of 2 series · 15 of 32 positions shown, 19 images · non-contrast
Comparison: none

REASON FOR EXAM: (1) LLQ pain; (2) LLQ pain
COMMENTS:

[Series 2: 3mm soft tissue · axial · 0.70mm/px · z∈[-605,-161]mm · 15 of 162 slices shown, 19 images]
[im 7/162  soft-tissue]
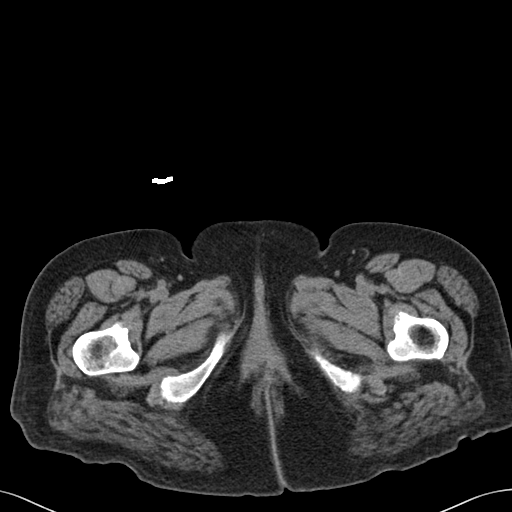
[im 7/162  bone]
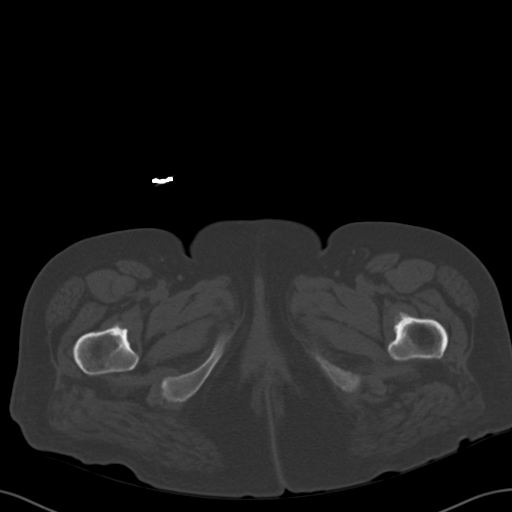
[im 21/162  soft-tissue]
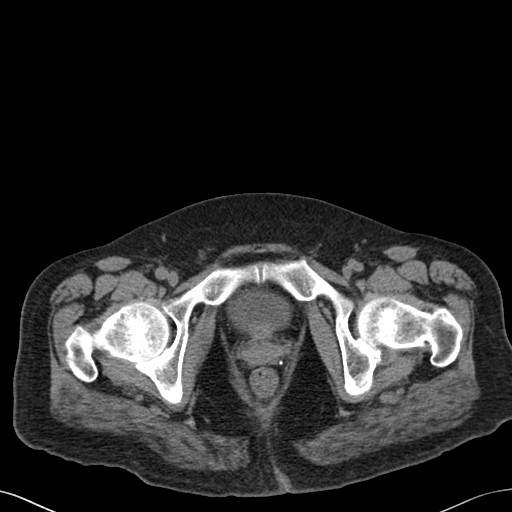
[im 34/162  soft-tissue]
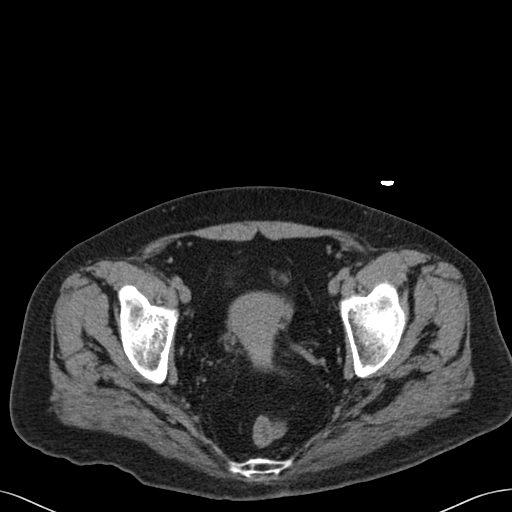
[im 47/162  soft-tissue]
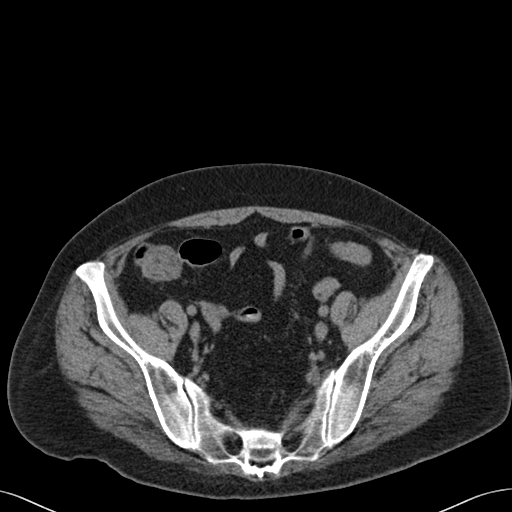
[im 54/162  soft-tissue]
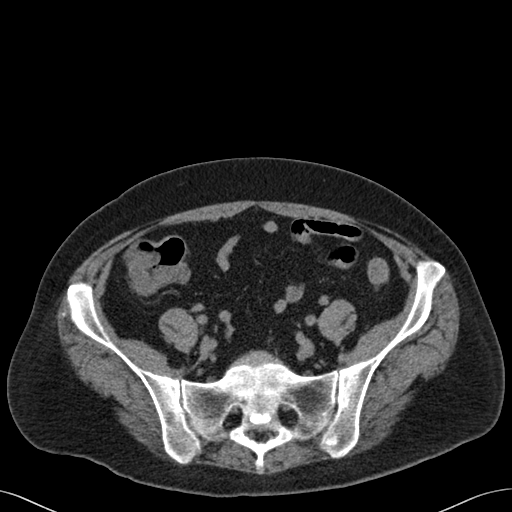
[im 68/162  soft-tissue]
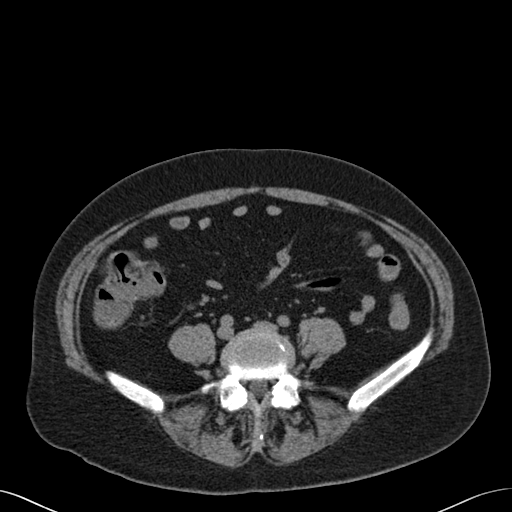
[im 81/162  soft-tissue]
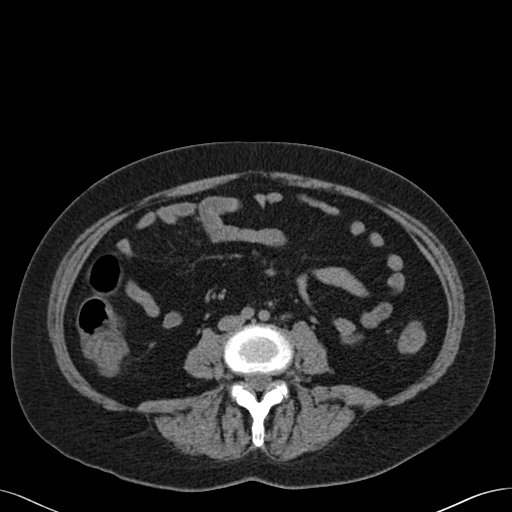
[im 94/162  soft-tissue]
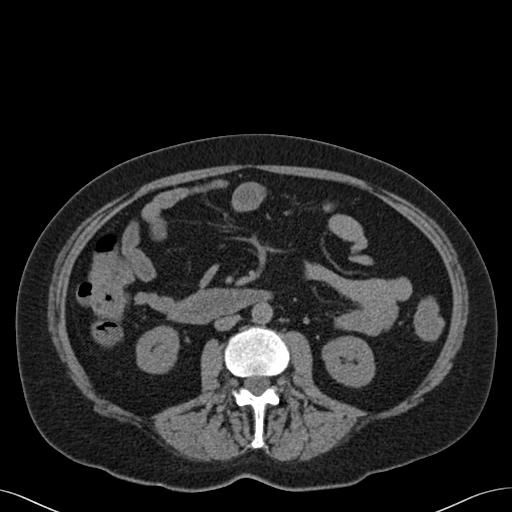
[im 108/162  soft-tissue]
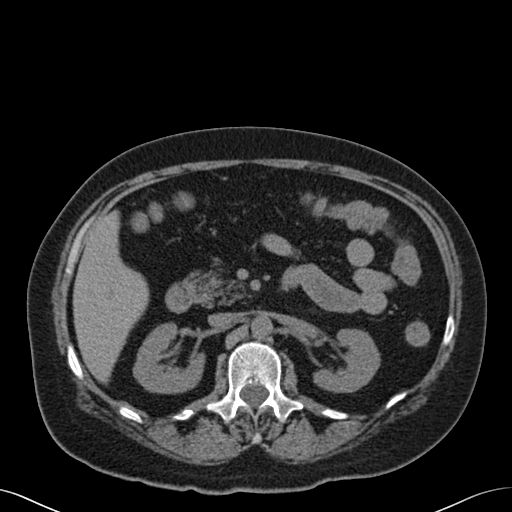
[im 108/162  bone]
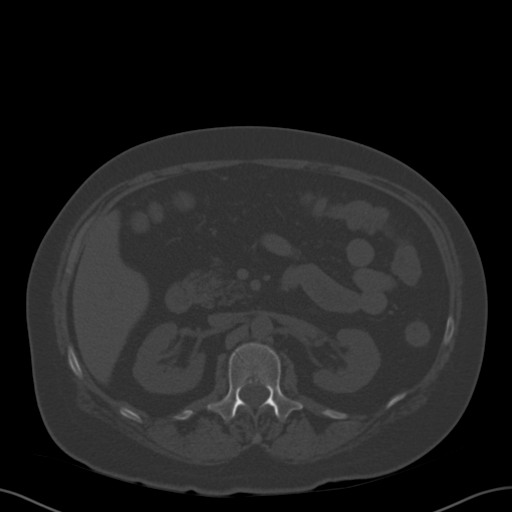
[im 115/162  soft-tissue]
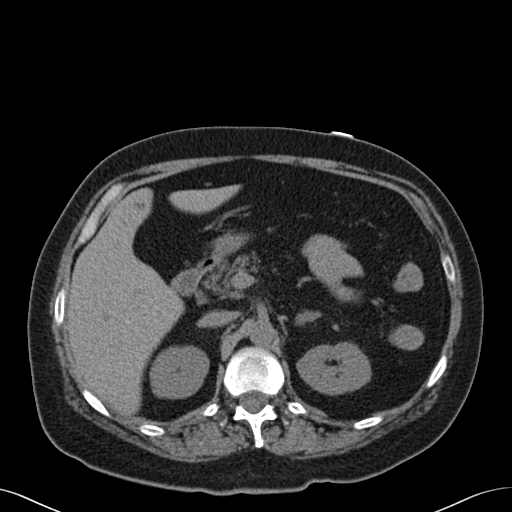
[im 128/162  soft-tissue]
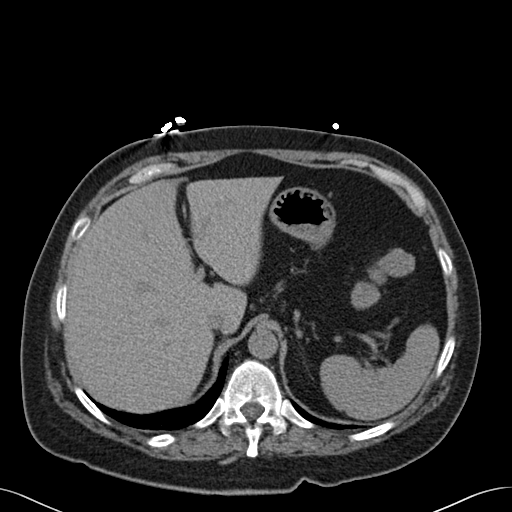
[im 135/162  lung]
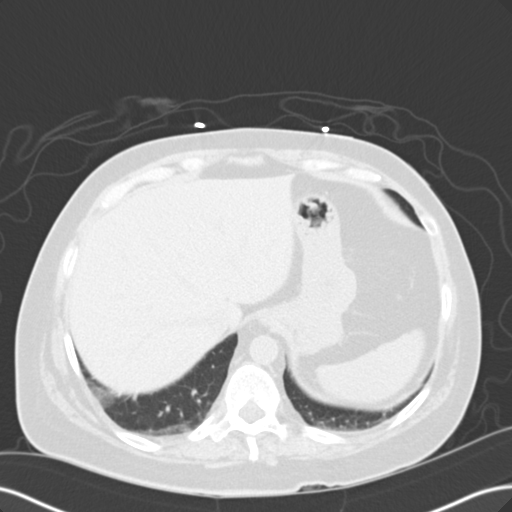
[im 141/162  soft-tissue]
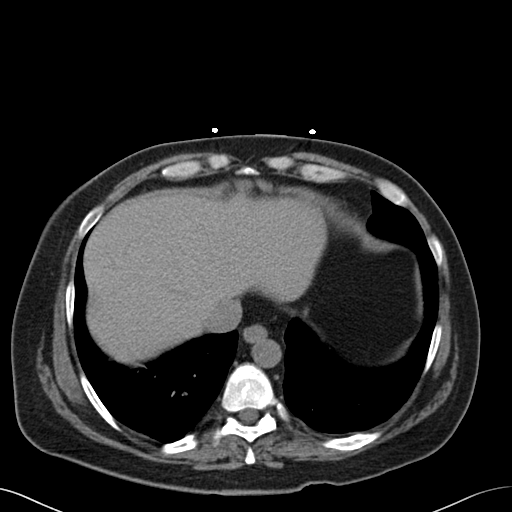
[im 141/162  lung]
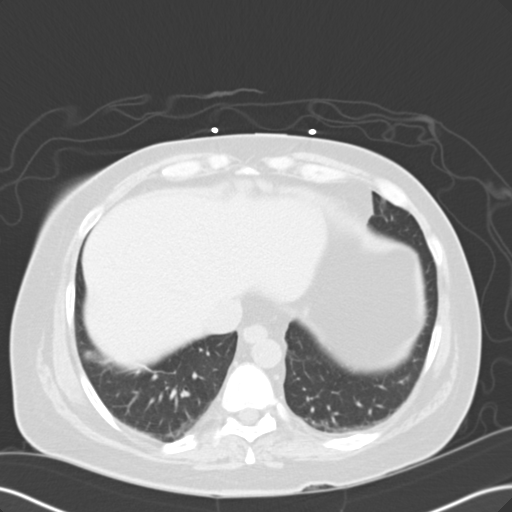
[im 148/162  lung]
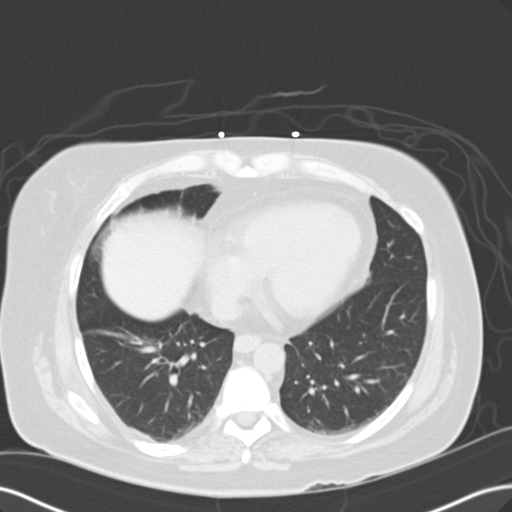
[im 155/162  soft-tissue]
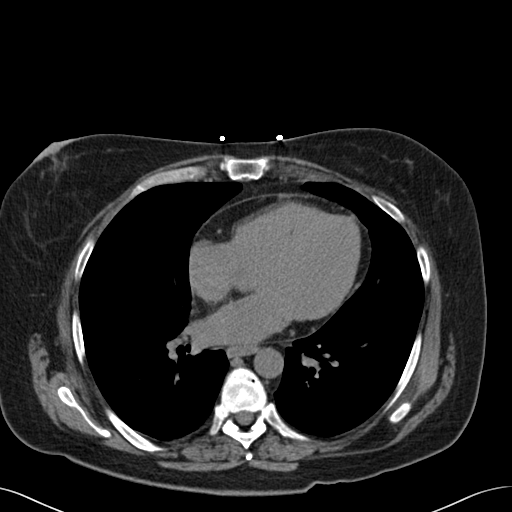
[im 155/162  lung]
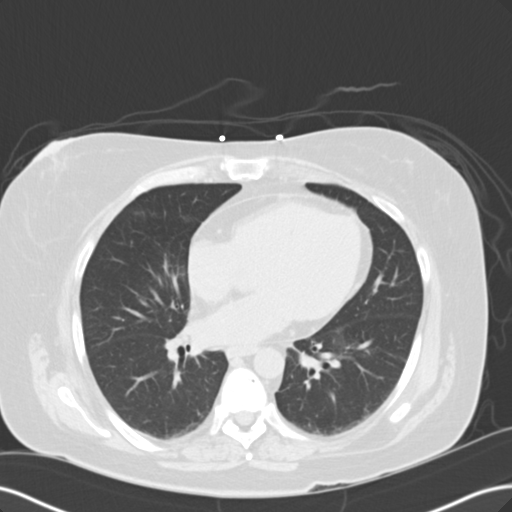

[15 of 32 positions shown; findings below may reference images not displayed]

PROCEDURE:     CT  - CT ABDOMEN AND PELVIS W[DATE]  [DATE]

RESULT:     CT without contrast through the abdomen and pelvis is
reconstructed at 3 mm slice thickness in the axial plane. The patient has no
previous exam for comparison.

There is no hydronephrosis or nephrolithiasis. No hydroureter is evident. No
bladder calculi are demonstrated. Presumed phleboliths appear in the pelvic
regions bilaterally. The uterus appears unremarkable. No adnexal masses
appreciated. There is no inflammatory stranding. The appendix appears
normal. Cholecystectomy clips are present. There is no significant hiatal
hernia. Noncontrast images of the spleen, pancreas, liver, aorta and adrenal
glands appear unremarkable. There is no adenopathy or inflammatory
stranding. There is no free air, ascites or abnormal fluid collection.

The lung bases show grossly normal aeration with some dependent atelectasis.
IMPRESSION: 1. There is minimal scattered colonic diverticulosis without definite CT
evidence of acute diverticulitis. Close clinical followup is recommended.
The study is otherwise unremarkable.

[REDACTED]

## 2014-12-24 ENCOUNTER — Inpatient Hospital Stay
Admission: EM | Admit: 2014-12-24 | Discharge: 2014-12-27 | DRG: 640 | Disposition: A | Discharge status: Home / Self Care | Payer: 59 | Attending: Internal Medicine | Admitting: Internal Medicine

## 2014-12-24 ENCOUNTER — Encounter: Payer: Self-pay | Admitting: Emergency Medicine

## 2014-12-24 DIAGNOSIS — J449 Chronic obstructive pulmonary disease, unspecified: Secondary | ICD-10-CM | POA: Diagnosis present

## 2014-12-24 DIAGNOSIS — I110 Hypertensive heart disease with heart failure: Secondary | ICD-10-CM | POA: Diagnosis present

## 2014-12-24 DIAGNOSIS — Z87891 Personal history of nicotine dependence: Secondary | ICD-10-CM

## 2014-12-24 DIAGNOSIS — R195 Other fecal abnormalities: Secondary | ICD-10-CM | POA: Insufficient documentation

## 2014-12-24 DIAGNOSIS — I5032 Chronic diastolic (congestive) heart failure: Secondary | ICD-10-CM | POA: Diagnosis present

## 2014-12-24 DIAGNOSIS — Z820 Family history of epilepsy and other diseases of the nervous system: Secondary | ICD-10-CM

## 2014-12-24 DIAGNOSIS — G894 Chronic pain syndrome: Secondary | ICD-10-CM | POA: Diagnosis present

## 2014-12-24 DIAGNOSIS — Z8249 Family history of ischemic heart disease and other diseases of the circulatory system: Secondary | ICD-10-CM

## 2014-12-24 DIAGNOSIS — E7800 Pure hypercholesterolemia, unspecified: Secondary | ICD-10-CM | POA: Diagnosis present

## 2014-12-24 DIAGNOSIS — E43 Unspecified severe protein-calorie malnutrition: Secondary | ICD-10-CM | POA: Insufficient documentation

## 2014-12-24 DIAGNOSIS — G8929 Other chronic pain: Secondary | ICD-10-CM | POA: Diagnosis present

## 2014-12-24 DIAGNOSIS — E876 Hypokalemia: Principal | ICD-10-CM | POA: Diagnosis present

## 2014-12-24 DIAGNOSIS — E860 Dehydration: Secondary | ICD-10-CM | POA: Diagnosis present

## 2014-12-24 DIAGNOSIS — F1310 Sedative, hypnotic or anxiolytic abuse, uncomplicated: Secondary | ICD-10-CM

## 2014-12-24 DIAGNOSIS — Z6820 Body mass index (BMI) 20.0-20.9, adult: Secondary | ICD-10-CM

## 2014-12-24 DIAGNOSIS — Z79899 Other long term (current) drug therapy: Secondary | ICD-10-CM

## 2014-12-24 DIAGNOSIS — K529 Noninfective gastroenteritis and colitis, unspecified: Secondary | ICD-10-CM | POA: Diagnosis present

## 2014-12-24 DIAGNOSIS — R197 Diarrhea, unspecified: Secondary | ICD-10-CM | POA: Insufficient documentation

## 2014-12-24 DIAGNOSIS — F419 Anxiety disorder, unspecified: Secondary | ICD-10-CM | POA: Diagnosis present

## 2014-12-24 DIAGNOSIS — Z915 Personal history of self-harm: Secondary | ICD-10-CM

## 2014-12-24 DIAGNOSIS — M069 Rheumatoid arthritis, unspecified: Secondary | ICD-10-CM | POA: Diagnosis present

## 2014-12-24 DIAGNOSIS — F329 Major depressive disorder, single episode, unspecified: Secondary | ICD-10-CM | POA: Diagnosis present

## 2014-12-24 HISTORY — DX: Hypokalemia: E87.6

## 2014-12-24 HISTORY — DX: Opioid abuse, uncomplicated: F11.10

## 2014-12-24 HISTORY — DX: Thyrotoxicosis, unspecified without thyrotoxic crisis or storm: E05.90

## 2014-12-24 HISTORY — DX: Chronic pain syndrome: G89.4

## 2014-12-24 HISTORY — DX: Migraine, unspecified, not intractable, without status migrainosus: G43.909

## 2014-12-24 HISTORY — DX: Acute pancreatitis without necrosis or infection, unspecified: K85.90

## 2014-12-24 LAB — C DIFFICILE QUICK SCREEN W PCR REFLEX
C DIFFICLE (CDIFF) ANTIGEN: NEGATIVE
C Diff interpretation: NEGATIVE
C Diff toxin: NEGATIVE

## 2014-12-24 LAB — CBC
HCT: 44.3 % (ref 35.0–47.0)
Hemoglobin: 14.5 g/dL (ref 12.0–16.0)
MCH: 28.6 pg (ref 26.0–34.0)
MCHC: 32.7 g/dL (ref 32.0–36.0)
MCV: 87.4 fL (ref 80.0–100.0)
PLATELETS: 399 10*3/uL (ref 150–440)
RBC: 5.07 MIL/uL (ref 3.80–5.20)
RDW: 17 % — AB (ref 11.5–14.5)
WBC: 11.7 10*3/uL — AB (ref 3.6–11.0)

## 2014-12-24 LAB — COMPREHENSIVE METABOLIC PANEL
ALBUMIN: 3.9 g/dL (ref 3.5–5.0)
ALK PHOS: 137 U/L — AB (ref 38–126)
ALT: 9 U/L — AB (ref 14–54)
ANION GAP: 8 (ref 5–15)
AST: 13 U/L — AB (ref 15–41)
BUN: 19 mg/dL (ref 6–20)
CALCIUM: 9.4 mg/dL (ref 8.9–10.3)
CO2: 17 mmol/L — AB (ref 22–32)
CREATININE: 1.37 mg/dL — AB (ref 0.44–1.00)
Chloride: 111 mmol/L (ref 101–111)
GFR calc Af Amer: 49 mL/min — ABNORMAL LOW (ref >60)
GFR calc non Af Amer: 42 mL/min — ABNORMAL LOW (ref >60)
GLUCOSE: 172 mg/dL — AB (ref 65–99)
Potassium: 2.4 mmol/L — CL (ref 3.5–5.1)
SODIUM: 136 mmol/L (ref 135–145)
TOTAL PROTEIN: 7.5 g/dL (ref 6.5–8.1)

## 2014-12-24 LAB — URINALYSIS COMPLETE WITH MICROSCOPIC (ARMC ONLY)
Bacteria, UA: NONE SEEN
Bilirubin Urine: NEGATIVE
GLUCOSE, UA: NEGATIVE mg/dL
HGB URINE DIPSTICK: NEGATIVE
NITRITE: NEGATIVE
PROTEIN: 30 mg/dL — AB
SPECIFIC GRAVITY, URINE: 1.027 (ref 1.005–1.030)
Trans Epithel, UA: 4
pH: 6 (ref 5.0–8.0)

## 2014-12-24 LAB — TSH: TSH: 0.743 u[IU]/mL (ref 0.350–4.500)

## 2014-12-24 LAB — MRSA PCR SCREENING: MRSA by PCR: NEGATIVE

## 2014-12-24 MED ORDER — POTASSIUM CHLORIDE 10 MEQ/100ML IV SOLN
10.0000 meq | INTRAVENOUS | Status: AC
  Administered 2014-12-24 (×2): 10 meq via INTRAVENOUS
  Filled 2014-12-24 (×2): qty 100

## 2014-12-24 MED ORDER — POTASSIUM CHLORIDE CRYS ER 20 MEQ PO TBCR
40.0000 meq | EXTENDED_RELEASE_TABLET | Freq: Once | ORAL | Status: AC
  Administered 2014-12-24: 40 meq via ORAL
  Filled 2014-12-24: qty 2

## 2014-12-24 MED ORDER — SODIUM CHLORIDE 0.9 % IV SOLN: 250.0000 mL | INTRAVENOUS | Status: DC | PRN

## 2014-12-24 MED ORDER — POTASSIUM CHLORIDE CRYS ER 20 MEQ PO TBCR
20.0000 meq | EXTENDED_RELEASE_TABLET | Freq: Three times a day (TID) | ORAL | Status: DC
  Administered 2014-12-24: 20 meq via ORAL
  Filled 2014-12-24: qty 1

## 2014-12-24 MED ORDER — ACETAMINOPHEN 325 MG PO TABS: 650.0000 mg | ORAL_TABLET | Freq: Four times a day (QID) | ORAL | Status: DC | PRN

## 2014-12-24 MED ORDER — ENOXAPARIN SODIUM 30 MG/0.3ML ~~LOC~~ SOLN
30.0000 mg | SUBCUTANEOUS | Status: DC
  Administered 2014-12-24: 30 mg via SUBCUTANEOUS
  Filled 2014-12-24: qty 0.3

## 2014-12-24 MED ORDER — CARISOPRODOL 350 MG PO TABS
350.0000 mg | ORAL_TABLET | Freq: Three times a day (TID) | ORAL | Status: DC
  Administered 2014-12-24 – 2014-12-27 (×9): 350 mg via ORAL
  Filled 2014-12-24 (×10): qty 1

## 2014-12-24 MED ORDER — ONDANSETRON HCL 4 MG/2ML IJ SOLN
4.0000 mg | Freq: Once | INTRAMUSCULAR | Status: AC
  Administered 2014-12-24: 4 mg via INTRAVENOUS
  Filled 2014-12-24: qty 2

## 2014-12-24 MED ORDER — LOPERAMIDE HCL 2 MG PO CAPS
2.0000 mg | ORAL_CAPSULE | Freq: Once | ORAL | Status: AC
  Administered 2014-12-24: 2 mg via ORAL
  Filled 2014-12-24: qty 1

## 2014-12-24 MED ORDER — ONDANSETRON HCL 4 MG/2ML IJ SOLN
4.0000 mg | Freq: Four times a day (QID) | INTRAMUSCULAR | Status: DC | PRN
  Administered 2014-12-25 – 2014-12-27 (×3): 4 mg via INTRAVENOUS
  Filled 2014-12-24 (×3): qty 2

## 2014-12-24 MED ORDER — MAGNESIUM SULFATE 2 GM/50ML IV SOLN
2.0000 g | Freq: Once | INTRAVENOUS | Status: AC
  Administered 2014-12-24: 2 g via INTRAVENOUS
  Filled 2014-12-24 (×2): qty 50

## 2014-12-24 MED ORDER — POTASSIUM CHLORIDE CRYS ER 20 MEQ PO TBCR
20.0000 meq | EXTENDED_RELEASE_TABLET | Freq: Once | ORAL | Status: AC
  Administered 2014-12-24: 20 meq via ORAL
  Filled 2014-12-24: qty 1

## 2014-12-24 MED ORDER — POTASSIUM CHLORIDE 10 MEQ/100ML IV SOLN: 10.0000 meq | INTRAVENOUS | Status: DC

## 2014-12-24 MED ORDER — SODIUM CHLORIDE 0.9 % IV BOLUS (SEPSIS)
1000.0000 mL | Freq: Once | INTRAVENOUS | Status: AC
  Administered 2014-12-24: 1000 mL via INTRAVENOUS

## 2014-12-24 MED ORDER — INFLUENZA VAC SPLIT QUAD 0.5 ML IM SUSY: 0.5000 mL | PREFILLED_SYRINGE | INTRAMUSCULAR | Status: DC

## 2014-12-24 MED ORDER — MORPHINE SULFATE (PF) 2 MG/ML IV SOLN
2.0000 mg | Freq: Once | INTRAVENOUS | Status: AC
  Administered 2014-12-24: 2 mg via INTRAVENOUS
  Filled 2014-12-24: qty 1

## 2014-12-24 MED ORDER — ONDANSETRON HCL 4 MG PO TABS
4.0000 mg | ORAL_TABLET | Freq: Four times a day (QID) | ORAL | Status: DC | PRN
  Administered 2014-12-24 – 2014-12-27 (×7): 4 mg via ORAL
  Filled 2014-12-24 (×2): qty 1
  Filled 2014-12-24: qty 11
  Filled 2014-12-24 (×4): qty 1

## 2014-12-24 MED ORDER — TRAMADOL HCL 50 MG PO TABS
100.0000 mg | ORAL_TABLET | Freq: Four times a day (QID) | ORAL | Status: DC | PRN
  Administered 2014-12-24 – 2014-12-27 (×10): 100 mg via ORAL
  Filled 2014-12-24 (×10): qty 2

## 2014-12-24 MED ORDER — SODIUM CHLORIDE 0.9 % IJ SOLN: 3.0000 mL | INTRAMUSCULAR | Status: DC | PRN

## 2014-12-24 MED ORDER — IBUPROFEN 400 MG PO TABS: 400.0000 mg | ORAL_TABLET | Freq: Four times a day (QID) | ORAL | Status: DC | PRN

## 2014-12-24 MED ORDER — ALBUTEROL SULFATE (2.5 MG/3ML) 0.083% IN NEBU: 2.5000 mg | INHALATION_SOLUTION | RESPIRATORY_TRACT | Status: DC | PRN

## 2014-12-24 MED ORDER — POTASSIUM CHLORIDE 10 MEQ/100ML IV SOLN
10.0000 meq | INTRAVENOUS | Status: AC
  Administered 2014-12-24 (×3): 10 meq via INTRAVENOUS
  Filled 2014-12-24 (×3): qty 100

## 2014-12-24 MED ORDER — ASPIRIN EC 81 MG PO TBEC
81.0000 mg | DELAYED_RELEASE_TABLET | Freq: Every day | ORAL | Status: DC
  Administered 2014-12-25 – 2014-12-27 (×3): 81 mg via ORAL
  Filled 2014-12-24 (×3): qty 1

## 2014-12-24 MED ORDER — ACETAMINOPHEN 650 MG RE SUPP: 650.0000 mg | Freq: Four times a day (QID) | RECTAL | Status: DC | PRN

## 2014-12-24 MED ORDER — SODIUM CHLORIDE 0.9 % IJ SOLN
3.0000 mL | Freq: Two times a day (BID) | INTRAMUSCULAR | Status: DC
  Administered 2014-12-24 – 2014-12-26 (×4): 3 mL via INTRAVENOUS

## 2014-12-24 MED ORDER — ALPRAZOLAM 0.5 MG PO TABS
0.5000 mg | ORAL_TABLET | Freq: Two times a day (BID) | ORAL | Status: DC | PRN
Start: 2014-12-24 — End: 2014-12-27
  Administered 2014-12-25 – 2014-12-27 (×5): 0.5 mg via ORAL
  Filled 2014-12-24 (×6): qty 1

## 2014-12-24 MED ORDER — LOPERAMIDE HCL 2 MG PO CAPS
2.0000 mg | ORAL_CAPSULE | Freq: Four times a day (QID) | ORAL | Status: DC | PRN
  Administered 2014-12-25 – 2014-12-27 (×2): 2 mg via ORAL
  Filled 2014-12-24 (×2): qty 1

## 2014-12-24 MED ORDER — CYCLOBENZAPRINE HCL 10 MG PO TABS: 10.0000 mg | ORAL_TABLET | Freq: Three times a day (TID) | ORAL | Status: DC | PRN

## 2014-12-24 MED ORDER — SODIUM CHLORIDE 0.9 % IJ SOLN: 3.0000 mL | Freq: Two times a day (BID) | INTRAMUSCULAR | Status: DC

## 2014-12-24 MED ORDER — PAROXETINE HCL 10 MG PO TABS
40.0000 mg | ORAL_TABLET | Freq: Every day | ORAL | Status: DC
  Administered 2014-12-24 – 2014-12-27 (×4): 40 mg via ORAL
  Filled 2014-12-24 (×4): qty 4

## 2014-12-24 NOTE — ED Provider Notes (Signed)
[facility] Emergency Department Provider Note REMINDER - THIS NOTE IS NOT A FINAL MEDICAL RECORD UNTIL IT IS SIGNED. UNTIL THEN, THE CONTENT BELOW MAY REFLECT INFORMATION FROM A DOCUMENTATION TEMPLATE, NOT THE ACTUAL PATIENT VISIT. ____________________________________________  Time seen: Approximately 2:20 PM  I have reviewed the triage vital signs and the nursing notes.   HISTORY  Chief Complaint Diarrhea and Dehydration    HPI Whitney Bernard is a 54 y.o. female who presents today for 5-6 months of loose stool. Patient having diarrhea clear and watery up to 5-6 times a day for the last 6 months. This is also associated with a slight crampy feeling. Not associated fevers, nausea or vomiting. She reports that she saw gastroenterology and had a colonoscopy which was "normal"  At present she has no pain, she does feel nauseated though, and also somewhat weak as well as lightheaded throat the last few days.   Past Medical History  Diagnosis Date  . Hypertension   . Hypercholesteremia   . RA (rheumatoid arthritis) (HCC)   . Depression   . COPD (chronic obstructive pulmonary disease) (HCC)   . Headache(784.0)     migraines  . Hypokalemia   . CHF (congestive heart failure) (HCC)   . Migraine   . Chronic pain syndrome   . Hyperthyroidism   . Acute pancreatitis     There are no active problems to display for this patient.   Past Surgical History  Procedure Laterality Date  . Fracture surgery    . Cholecystectomy    . Cervical polypectomy    . Tubal ligation    . Neck surgery    . Wisdom tooth extraction    . Anterior cervical decomp/discectomy fusion N/A 11/26/2012    Procedure: ANTERIOR CERVICAL DECOMPRESSION/DISCECTOMY FUSION 2 LEVELS;  Surgeon: Reinaldo Meeker, MD;  Location: [facility];  Service: Neurosurgery;  Laterality: N/A;  ANTERIOR CERVICAL DECOMPRESSION/DISCECTOMY FUSION 2 LEVELS    Current Outpatient Rx  Name  Route  Sig  Dispense   Refill  . ALPRAZolam (XANAX) 0.5 MG tablet   Oral   Take 0.5 mg by mouth 2 (two) times daily. Panic attacks         . butalbital-acetaminophen-caffeine (FIORICET, ESGIC) 50-325-40 MG per tablet   Oral   Take 1 tablet by mouth as needed. Headaches         . cyclobenzaprine (FLEXERIL) 10 MG tablet   Oral   Take 1 tablet (10 mg total) by mouth 3 (three) times daily as needed for muscle spasms.   60 tablet   0   . furosemide (LASIX) 40 MG tablet   Oral   Take 40 mg by mouth daily.         . Oxycodone HCl 10 MG TABS   Oral   Take 1 tablet (10 mg total) by mouth every 4 (four) hours as needed (for pain).   60 tablet   0   . PARoxetine (PAXIL) 40 MG tablet   Oral   Take 40 mg by mouth daily.         . potassium chloride SA (K-DUR,KLOR-CON) 20 MEQ tablet   Oral   Take 20 mEq by mouth 3 (three) times daily.            Allergies Almond oil  Family History  Problem Relation Age of Onset  . CAD Father   . Alzheimer's disease      Social History Social History  Substance Use Topics  .  Smoking status: Former Smoker -- 0.50 packs/day for 38 years    Types: Cigarettes    Quit date: 11/14/2012  . Smokeless tobacco: Never Used  . Alcohol Use: Yes     Comment: occ    Review of Systems Constitutional: No fever/chills Eyes: No visual changes. ENT: No sore throat. Cardiovascular: Denies chest pain. Respiratory: Denies shortness of breath. Gastrointestinal: no vomiting.  No constipation. Genitourinary: Negative for dysuria. Musculoskeletal: Negative for back pain. Skin: Negative for rash. Neurological: Negative for headaches, focal weakness or numbness.  10-point ROS otherwise negative.  ____________________________________________   PHYSICAL EXAM:  VITAL SIGNS: ED Triage Vitals  Enc Vitals Group     BP 12/24/14 1125 126/87 mmHg     Pulse Rate 12/24/14 1125 90     Resp 12/24/14 1125 15     Temp 12/24/14 1125 98.4 F (36.9 C)     Temp Source  12/24/14 1125 Oral     SpO2 12/24/14 1125 98 %     Weight 12/24/14 1125 120 lb (54.432 kg)     Height 12/24/14 1125 5\' 4"  (1.626 m)     Head Cir --      Peak Flow --      Pain Score 12/24/14 1133 5     Pain Loc --      Pain Edu? --      Excl. in GC? --    Constitutional: Alert and oriented. Well appearing and in no acute distress. Eyes: Conjunctivae are normal. PERRL. EOMI. Head: Atraumatic. Nose: No congestion/rhinnorhea. Mouth/Throat: Mucous membranes are quite dry.  Oropharynx non-erythematous. Neck: No stridor.   Cardiovascular: Normal rate, regular rhythm. Grossly normal heart sounds.  Good peripheral circulation. Respiratory: Normal respiratory effort.  No retractions. Lungs CTAB. Gastrointestinal: Soft and nontender. No distention. No abdominal bruits. No CVA tenderness. Musculoskeletal: No lower extremity tenderness nor edema.  No joint effusions. Neurologic:  Normal speech and language. No gross focal neurologic deficits are appreciated. She does appear fatigued. Skin:  Skin is warm, dry and intact. No rash noted. Psychiatric: Mood and affect are normal. Speech and behavior are normal.  ____________________________________________   LABS (all labs ordered are listed, but only abnormal results are displayed)  Labs Reviewed  COMPREHENSIVE METABOLIC PANEL - Abnormal; Notable for the following:    Potassium 2.4 (*)    CO2 17 (*)    Glucose, Bld 172 (*)    Creatinine, Ser 1.37 (*)    AST 13 (*)    ALT 9 (*)    Alkaline Phosphatase 137 (*)    Total Bilirubin <0.1 (*)    GFR calc non Af Amer 42 (*)    GFR calc Af Amer 49 (*)    All other components within normal limits  CBC - Abnormal; Notable for the following:    WBC 11.7 (*)    RDW 17.0 (*)    All other components within normal limits  C DIFFICILE QUICK SCREEN W PCR REFLEX  URINALYSIS COMPLETEWITH MICROSCOPIC ([facility] ONLY)   ____________________________________________  EKG  Reviewed and interpreted by  me EKG time 11:45 AM Normal sinus rhythm Heart rate 87 QTc Prolonged at 480 No ST elevations or ischemic T-wave changes noted ____________________________________________  RADIOLOGY  Given the patient's 5-6 months of persistent cramping and diarrhea, I do not believe that emergent CT imaging is warranted at this time. She has no signs or symptoms of bowel obstruction, intra-abdominal infection, or peritonitis by exam. ____________________________________________   PROCEDURES  Procedure(s) performed: None  Critical  Care performed: No  ____________________________________________   INITIAL IMPRESSION / ASSESSMENT AND PLAN / ED COURSE  Pertinent labs & imaging results that were available during my care of the patient were reviewed by me and considered in my medical decision making (see chart for details).  Patient presents with worsening and ongoing diarrhea for the last 5-6 months. She had a recent colonoscopy has been value by GI, but states she has persistent loose stools and diarrhea. She does appear fatigued, somewhat dehydrated, and also her potassium is critically low at 2.4. Given her chronically low potassium level, I will admit to the hospital for ongoing care and evaluation, hydration, repletion and further evaluation and hospitalist service. ____________________________________________   FINAL CLINICAL IMPRESSION(S) / ED DIAGNOSES  Final diagnoses:  Dehydration  Loose stools  Hypokalemia due to loss of potassium      Sharyn Creamer, MD 12/24/14 1439

## 2014-12-24 NOTE — ED Notes (Signed)
Pt to ED via EMS from home c/o diarrhea x6 months. States her PCP told her to take Pepto-Bismol which she has done without relief. States she hasn't eaten for 3 days and feels dehydrated. C/o 5/10 abdominal pain which is unchanged x6 months.

## 2014-12-24 NOTE — H&P (Signed)
[facility] [place] [facility]   PATIENT NAME: Whitney Bernard    MR#:  [id number]  DATE OF BIRTH:  05-Sep-1960  DATE OF ADMISSION:  12/24/2014  PRIMARY CARE PHYSICIAN: Corky Downs, MD   REQUESTING/REFERRING PHYSICIAN: Dr. Fanny Bien  CHIEF COMPLAINT:   Chief Complaint  Patient presents with  . Diarrhea  . Dehydration    HISTORY OF PRESENT ILLNESS:  Whitney Bernard  is a 54 y.o. female with a known history of depression, chronic pain syndrome, chronic diarrhea presents to the emergency room complaining of diarrhea. Patient mentions that she has had chronic abdominal pain and diarrhea for 2 years. This got worse 6 months back. Today she was found to have significant hypokalemia in spite of being oral supplements at home and is being admitted to the hospitalist service for IV and oral replacement. Patient's magnesium level was not checked in the emergency room but she has already received 2 g of magnesium sulfate IV. Patient has not noticed any black stools or fever. No recent antibiotic use. C. difficile checked in the emergency room negative. Patient has had colonoscopy in the past with GI. Had acute pancreatitis and colitis in 2014.  Patient has had problems with narcotic abuse.  PAST MEDICAL HISTORY:   Past Medical History  Diagnosis Date  . Hypertension   . Hypercholesteremia   . RA (rheumatoid arthritis) (HCC)   . Depression   . COPD (chronic obstructive pulmonary disease) (HCC)   . Headache(784.0)     migraines  . Hypokalemia   . CHF (congestive heart failure) (HCC)   . Migraine   . Chronic pain syndrome   . Hyperthyroidism   . Acute pancreatitis   . Narcotic abuse     PAST SURGICAL HISTORY:   Past Surgical History  Procedure Laterality Date  . Fracture surgery    . Cholecystectomy    . Cervical polypectomy    . Tubal ligation    . Neck surgery    . Wisdom tooth extraction    . Anterior cervical decomp/discectomy fusion N/A 11/26/2012     Procedure: ANTERIOR CERVICAL DECOMPRESSION/DISCECTOMY FUSION 2 LEVELS;  Surgeon: Reinaldo Meeker, MD;  Location: [facility];  Service: Neurosurgery;  Laterality: N/A;  ANTERIOR CERVICAL DECOMPRESSION/DISCECTOMY FUSION 2 LEVELS    SOCIAL HISTORY:   Social History  Substance Use Topics  . Smoking status: Former Smoker -- 0.50 packs/day for 38 years    Types: Cigarettes    Quit date: 11/14/2012  . Smokeless tobacco: Never Used  . Alcohol Use: Yes     Comment: occ    FAMILY HISTORY:   Family History  Problem Relation Age of Onset  . CAD Father   . Alzheimer's disease      DRUG ALLERGIES:   Allergies  Allergen Reactions  . Almond Oil Hives    REVIEW OF SYSTEMS:   Review of Systems  Constitutional: Positive for malaise/fatigue. Negative for fever, chills and weight loss.  HENT: Negative for hearing loss and nosebleeds.   Eyes: Negative for blurred vision, double vision and pain.  Respiratory: Negative for cough, hemoptysis, sputum production, shortness of breath and wheezing.   Cardiovascular: Negative for chest pain, palpitations, orthopnea and leg swelling.  Gastrointestinal: Positive for nausea, abdominal pain and diarrhea. Negative for vomiting and constipation.  Genitourinary: Negative for dysuria and hematuria.  Musculoskeletal: Negative for myalgias, back pain and falls.  Skin: Negative for rash.  Neurological: Negative for dizziness, tremors, sensory change, speech change, focal weakness, seizures  and headaches.  Endo/Heme/Allergies: Does not bruise/bleed easily.  Psychiatric/Behavioral: Positive for depression. Negative for memory loss. The patient is not nervous/anxious.     MEDICATIONS AT HOME:   Prior to Admission medications   Medication Sig Start Date End Date Taking? Authorizing Provider  ALPRAZolam Prudy Feeler) 0.5 MG tablet Take 0.5 mg by mouth 2 (two) times daily. Panic attacks    Historical Provider, MD  butalbital-acetaminophen-caffeine (FIORICET, ESGIC)  50-325-40 MG per tablet Take 1 tablet by mouth as needed. Headaches    Historical Provider, MD  cyclobenzaprine (FLEXERIL) 10 MG tablet Take 1 tablet (10 mg total) by mouth 3 (three) times daily as needed for muscle spasms. 11/27/12   Coletta Memos, MD  furosemide (LASIX) 40 MG tablet Take 40 mg by mouth daily.    Historical Provider, MD  Oxycodone HCl 10 MG TABS Take 1 tablet (10 mg total) by mouth every 4 (four) hours as needed (for pain). 11/27/12   Coletta Memos, MD  PARoxetine (PAXIL) 40 MG tablet Take 40 mg by mouth daily.    Historical Provider, MD  potassium chloride SA (K-DUR,KLOR-CON) 20 MEQ tablet Take 20 mEq by mouth 3 (three) times daily.     Historical Provider, MD      VITAL SIGNS:  Blood pressure 134/98, pulse 102, temperature 98.4 F (36.9 C), temperature source Oral, resp. rate 15, height 5\' 4"  (1.626 m), weight 54.432 kg (120 lb), SpO2 98 %.  PHYSICAL EXAMINATION:  Physical Exam  GENERAL:  54 y.o.-year-old patient lying in the bed with no acute distress.  EYES: Pupils equal, round, reactive to light and accommodation. No scleral icterus. Extraocular muscles intact.  HEENT: Head atraumatic, normocephalic. Oropharynx and nasopharynx clear. No oropharyngeal erythema, moist oral mucosa  NECK:  Supple, no jugular venous distention. No thyroid enlargement, no tenderness.  LUNGS: Normal breath sounds bilaterally, no wheezing, rales, rhonchi. No use of accessory muscles of respiration.  CARDIOVASCULAR: S1, S2 normal. No murmurs, rubs, or gallops.  ABDOMEN: Soft, nondistended. Bowel sounds present. No organomegaly or mass. Diffuse tenderness. No rigidity guarding. EXTREMITIES: No pedal edema, cyanosis, or clubbing. + 2 pedal & radial pulses b/l.   NEUROLOGIC: Cranial nerves II through XII are intact. No focal Motor or sensory deficits appreciated b/l PSYCHIATRIC: The patient is alert and oriented x 3. Good affect.  SKIN: No obvious rash, lesion, or ulcer.   LABORATORY PANEL:    CBC  Recent Labs Lab 12/24/14 1142  WBC 11.7*  HGB 14.5  HCT 44.3  PLT 399   ------------------------------------------------------------------------------------------------------------------  Chemistries   Recent Labs Lab 12/24/14 1142  NA 136  K 2.4*  CL 111  CO2 17*  GLUCOSE 172*  BUN 19  CREATININE 1.37*  CALCIUM 9.4  AST 13*  ALT 9*  ALKPHOS 137*  BILITOT <0.1*   ------------------------------------------------------------------------------------------------------------------  Cardiac Enzymes No results for input(s): TROPONINI in the last 168 hours. ------------------------------------------------------------------------------------------------------------------  RADIOLOGY:  No results found.   IMPRESSION AND PLAN:   * Hypokalemia Due to chronic diarrhea. Patient does have potassium tablets at home and has been compliant. But has worsening hypokalemia due to diarrhea. At this point she will be admitted for replacement through IV. We will also add additional oral potassium. Repeat potassium levels and replete as needed. Patient has received magnesium supplementation and a magnesium level would not help at this point.  * Chronic diarrhea C. difficile is negative in the emergency room. Afebrile. Normal WBC. Patient has had colonoscopy with Dr. Bluford Kaufmann in the past. Maryclare Labrador start Imodium  as needed. Consult GI for further input. Check TSH.  * Chronic pain syndrome Patient is requesting IV pain medications but her pain is unchanged over the last 3-4 years. Avoid narcotic medications. I have added ibuprofen for her moderate pain she uses at home. Added tramadol for severe pain.  * Chronic diastolic congestive heart failure Euvolemic. Patient stopped Lasix recently due to ongoing diarrhea. We will hold Lasix.  * Depression. Continue medications.  * DVT prophylaxis with Lovenox.   All the records are reviewed and case discussed with ED provider. Management plans  discussed with the patient, family and they are in agreement.  CODE STATUS: FULL  TOTAL TIME TAKING CARE OF THIS PATIENT: 40 minutes.    Milagros Loll R M.D on 12/24/2014 at 2:45 PM  Between 7am to 6pm - Pager - [phone]  After 6pm go to [email or web address] - password [email or web address]  [facility]  Office  [phone]  CC: Primary care physician; Corky Downs, MD     Note: This dictation was prepared with Dragon dictation along with smaller phrase technology. Any transcriptional errors that result from this process are unintentional.

## 2014-12-25 DIAGNOSIS — R197 Diarrhea, unspecified: Secondary | ICD-10-CM | POA: Diagnosis not present

## 2014-12-25 DIAGNOSIS — R195 Other fecal abnormalities: Secondary | ICD-10-CM | POA: Diagnosis not present

## 2014-12-25 DIAGNOSIS — F1310 Sedative, hypnotic or anxiolytic abuse, uncomplicated: Secondary | ICD-10-CM

## 2014-12-25 LAB — BASIC METABOLIC PANEL
ANION GAP: 5 (ref 5–15)
BUN: 14 mg/dL (ref 6–20)
CALCIUM: 9 mg/dL (ref 8.9–10.3)
CO2: 16 mmol/L — ABNORMAL LOW (ref 22–32)
Chloride: 117 mmol/L — ABNORMAL HIGH (ref 101–111)
Creatinine, Ser: 1.24 mg/dL — ABNORMAL HIGH (ref 0.44–1.00)
GFR calc Af Amer: 55 mL/min — ABNORMAL LOW (ref >60)
GFR, EST NON AFRICAN AMERICAN: 48 mL/min — AB (ref >60)
Glucose, Bld: 95 mg/dL (ref 65–99)
POTASSIUM: 3.3 mmol/L — AB (ref 3.5–5.1)
Sodium: 138 mmol/L (ref 135–145)

## 2014-12-25 MED ORDER — POTASSIUM CHLORIDE 20 MEQ/15ML (10%) PO SOLN
40.0000 meq | Freq: Once | ORAL | Status: AC
  Administered 2014-12-25: 40 meq via ORAL
  Filled 2014-12-25: qty 30

## 2014-12-25 MED ORDER — ENOXAPARIN SODIUM 40 MG/0.4ML ~~LOC~~ SOLN
40.0000 mg | SUBCUTANEOUS | Status: DC
  Administered 2014-12-25 – 2014-12-26 (×2): 40 mg via SUBCUTANEOUS
  Filled 2014-12-25 (×2): qty 0.4

## 2014-12-25 MED ORDER — PEG 3350-KCL-NA BICARB-NACL 420 G PO SOLR
2000.0000 mL | Freq: Once | ORAL | Status: AC
  Administered 2014-12-25: 2000 mL via ORAL
  Filled 2014-12-25 (×2): qty 4000

## 2014-12-25 MED ORDER — CEFUROXIME AXETIL 250 MG PO TABS
250.0000 mg | ORAL_TABLET | Freq: Two times a day (BID) | ORAL | Status: DC
  Administered 2014-12-25 – 2014-12-27 (×5): 250 mg via ORAL
  Filled 2014-12-25 (×6): qty 1

## 2014-12-25 NOTE — Care Management Obs Status (Signed)
MEDICARE OBSERVATION STATUS NOTIFICATION   Patient Details  Name: Whitney Bernard MRN: [id number] Date of Birth: 29-May-1960   Medicare Observation Status Notification Given:  Yes    Marily Memos, RN 12/25/2014, 1:48 PM

## 2014-12-25 NOTE — Consult Note (Signed)
[facility]  [address], [address] [place], [place] [address] Phone: [phone] Fax : [phone]  Consultation  Referring Provider:     No ref. provider found Primary Care Physician:  Corky Downs, MD Primary Gastroenterologist:  Dr. Bluford Kaufmann         Reason for Consultation:     Diarrhea  Date of Admission:  12/24/2014 Date of Consultation:  12/25/2014         HPI:   Whitney Bernard is a 54 y.o. female  Who was admitted to the hospital for diarrhea. The patient reports that she's had diarrhea for two years. She reports that the diarrhea has gotten worse in the last six months. She states that she has proximally five bowel movements a day and is sometimes woken up from the diarrhea from her sleep. This is not all the time. The patient had a colonoscopy back in 2014. She has not seen against neurologists since then for this diarrhea. There is no report of any black stools or bloody stools. In the emergency room she was found to have decreased potassium therefore she was admitted to the hospital.  Past Medical History  Diagnosis Date  . Hypertension   . Hypercholesteremia   . RA (rheumatoid arthritis) (HCC)   . Depression   . COPD (chronic obstructive pulmonary disease) (HCC)   . Headache(784.0)     migraines  . Hypokalemia   . CHF (congestive heart failure) (HCC)   . Migraine   . Chronic pain syndrome   . Hyperthyroidism   . Acute pancreatitis   . Narcotic abuse     Past Surgical History  Procedure Laterality Date  . Fracture surgery    . Cholecystectomy    . Cervical polypectomy    . Tubal ligation    . Neck surgery    . Wisdom tooth extraction    . Anterior cervical decomp/discectomy fusion N/A 11/26/2012    Procedure: ANTERIOR CERVICAL DECOMPRESSION/DISCECTOMY FUSION 2 LEVELS;  Surgeon: Reinaldo Meeker, MD;  Location: [facility];  Service: Neurosurgery;  Laterality: N/A;  ANTERIOR CERVICAL DECOMPRESSION/DISCECTOMY FUSION 2 LEVELS    Prior to Admission  medications   Medication Sig Start Date End Date Taking? Authorizing Provider  ALPRAZolam Prudy Feeler) 0.5 MG tablet Take 0.5 mg by mouth 2 (two) times daily. Panic attacks   Yes Historical Provider, MD  busPIRone (BUSPAR) 5 MG tablet Take 5 mg by mouth 2 (two) times daily. 12/11/14  Yes Historical Provider, MD  butalbital-acetaminophen-caffeine (FIORICET, ESGIC) 50-325-40 MG per tablet Take 1 tablet by mouth as needed. Headaches   Yes Historical Provider, MD  carisoprodol (SOMA) 350 MG tablet Take 350 mg by mouth 2 (two) times daily as needed. For muscle relaxation. 12/11/14  Yes Historical Provider, MD  furosemide (LASIX) 40 MG tablet Take 40 mg by mouth daily.   Yes Historical Provider, MD  ibuprofen (ADVIL,MOTRIN) 200 MG tablet Take 400-600 mg by mouth every 6 (six) hours as needed. For pain.   Yes Historical Provider, MD  PARoxetine (PAXIL) 40 MG tablet Take 40 mg by mouth daily.   Yes Historical Provider, MD  potassium chloride SA (K-DUR,KLOR-CON) 20 MEQ tablet Take 20 mEq by mouth 3 (three) times daily.    Yes Historical Provider, MD    Family History  Problem Relation Age of Onset  . CAD Father   . Alzheimer's disease       Social History  Substance Use Topics  . Smoking status: Former Smoker -- 0.50 packs/day for  38 years    Types: Cigarettes    Quit date: 11/14/2012  . Smokeless tobacco: Never Used  . Alcohol Use: Yes     Comment: occ    Allergies as of 12/24/2014 - Review Complete 12/24/2014  Allergen Reaction Noted  . Almond oil Hives 10/12/2011    Review of Systems:    All systems reviewed and negative except where noted in HPI.   Physical Exam:  Vital signs in last 24 hours: Temp:  [98.2 F (36.8 C)-98.9 F (37.2 C)] 98.5 F (36.9 C) (11/07 1225) Pulse Rate:  [73-85] 73 (11/07 1225) Resp:  [16-18] 18 (11/07 1225) BP: (111-124)/(58-78) 117/62 mmHg (11/07 1225) SpO2:  [96 %-99 %] 98 % (11/07 1225) Weight:  [136 lb 1.6 oz (61.735 kg)] 136 lb 1.6 oz (61.735 kg)  (11/07 0500) Last BM Date: 12/24/14 General:   Pleasant, cooperative in NAD Head:  Normocephalic and atraumatic. Eyes:   No icterus.   Conjunctiva pink. PERRLA. Ears:  Normal auditory acuity. Neck:  Supple; no masses or thyroidomegaly Lungs: Respirations even and unlabored. Lungs clear to auscultation bilaterally.   No wheezes, crackles, or rhonchi.  Heart:  Regular rate and rhythm;  Without murmur, clicks, rubs or gallops Abdomen:  Soft, nondistended, nontender. Normal bowel sounds. No appreciable masses or hepatomegaly.  No rebound or guarding.  Rectal:  Not performed. Msk:  Symmetrical without gross deformities.   Extremities:  Without edema, cyanosis or clubbing. Neurologic:  Alert and oriented x3;  grossly normal neurologically. Skin:  Intact without significant lesions or rashes. Cervical Nodes:  No significant cervical adenopathy. Psych:  Alert and cooperative. Normal affect.  LAB RESULTS:  Recent Labs  12/24/14 1142  WBC 11.7*  HGB 14.5  HCT 44.3  PLT 399   BMET  Recent Labs  12/24/14 1142 12/25/14 0429  NA 136 138  K 2.4* 3.3*  CL 111 117*  CO2 17* 16*  GLUCOSE 172* 95  BUN 19 14  CREATININE 1.37* 1.24*  CALCIUM 9.4 9.0   LFT  Recent Labs  12/24/14 1142  PROT 7.5  ALBUMIN 3.9  AST 13*  ALT 9*  ALKPHOS 137*  BILITOT <0.1*   PT/INR No results for input(s): LABPROT, INR in the last 72 hours.  STUDIES: No results found.    Impression / Plan:   Whitney Bernard is a 54 y.o. y/o female with  Who was admitted with diarrhea for the last two years getting worse in the last six months. I'm now being consultative for evaluation the diarrhea. The patient will be set up for colonoscopy for tomorrow to look for colitis  or another cause for the patient Diarrhea. I have discussed risks & benefits which include, but are not limited to, bleeding, infection, perforation & drug reaction.  The patient agrees with this plan & written consent will be obtained.      Thank you for involving me in the care of this patient.        Darlina Rumpf, MD  12/25/2014, 7:43 PM   Note: This dictation was prepared with Dragon dictation along with smaller phrase technology. Any transcriptional errors that result from this process are unintentional.

## 2014-12-25 NOTE — Progress Notes (Signed)
Initial Nutrition Assessment  DOCUMENTATION CODES:   Severe malnutrition in context of chronic illness  INTERVENTION:  Meals and snacks: Cater to pt preferences Medical Nutrition Supplement Therapy: Recommend boost breeze TID for added nutrition   NUTRITION DIAGNOSIS:   Inadequate oral intake related to altered GI function as evidenced by per patient/family report.    GOAL:   Patient will meet greater than or equal to 90% of their needs    MONITOR:    (Energy intake, Digestive system, )  REASON FOR ASSESSMENT:   Malnutrition Screening Tool    ASSESSMENT:      Pt admitted with diarrhea times 2 years and abdominal pain, hypokalemia, dehydration. GI consulted  Past Medical History  Diagnosis Date  . Hypertension   . Hypercholesteremia   . RA (rheumatoid arthritis) (HCC)   . Depression   . COPD (chronic obstructive pulmonary disease) (HCC)   . Headache(784.0)     migraines  . Hypokalemia   . CHF (congestive heart failure) (HCC)   . Migraine   . Chronic pain syndrome   . Hyperthyroidism   . Acute pancreatitis   . Narcotic abuse     Current Nutrition: taking sips at most of liquids.  Noted untouched liquid tray at bedside.    Food/Nutrition-Related History: Pt reports poor po intake for the past 6 months secondary to abdominal pain and diarrhea (mostly gatorade and ensure prior to admission).  Pt kept eyes closed for most of visit, wanting to get some sleep   Scheduled Medications:  . aspirin EC  81 mg Oral Daily  . carisoprodol  350 mg Oral TID  . cefUROXime  250 mg Oral BID WC  . enoxaparin (LOVENOX) injection  40 mg Subcutaneous Q24H  . Influenza vac split quadrivalent PF  0.5 mL Intramuscular Tomorrow-1000  . PARoxetine  40 mg Oral Daily  . sodium chloride  3 mL Intravenous Q12H     Electrolyte/Renal Profile and Glucose Profile:   Recent Labs Lab 12/24/14 1142 12/25/14 0429  NA 136 138  K 2.4* 3.3*  CL 111 117*  CO2 17* 16*  BUN 19 14   CREATININE 1.37* 1.24*  CALCIUM 9.4 9.0  GLUCOSE 172* 95   Protein Profile:   Recent Labs Lab 12/24/14 1142  ALBUMIN 3.9    Gastrointestinal Profile: Last BM: 11/6   Nutrition-Focused Physical Exam Findings: pt wanting this writer to come back another day to perform physical exam   Weight Change: Pt wt encounters 13% weight loss in the last 3 months    Diet Order:  Diet clear liquid Room service appropriate?: Yes; Fluid consistency:: Thin  Skin:   reviewed   Height:   Ht Readings from Last 1 Encounters:  12/24/14 5\' 4"  (1.626 m)    Weight:   Wt Readings from Last 1 Encounters:  12/25/14 136 lb 1.6 oz (61.735 kg)     BMI:  Body mass index is 23.35 kg/(m^2).  Estimated Nutritional Needs:   Kcal:  BEE 1195 kcals (IF 1.0-1.2, AF 1.3) 13/07/16 kcals/d.   Protein:  (1.0-1.2 g/kg) 62-74 g/d  Fluid:  (30-60ml/kg) 1860-211ml/d  EDUCATION NEEDS:   No education needs identified at this time  HIGH Care Level  Saintclair Schroader B. 79m, RD, LDN [phone] (pager)

## 2014-12-25 NOTE — Progress Notes (Signed)
[facility]   PATIENT NAME: Whitney Bernard    MR#:  [id number]  DATE OF BIRTH:  1960-12-17  SUBJECTIVE:  Patient says she's been having chronic diarrhea for the past 6 months which has over the past week may have worsened. Patient denies abdominal pain. Patient had colonoscopy a couple years ago which she reports is normal. Then is reporting patient has been depressed according to the nurse.  REVIEW OF SYSTEMS:    Review of Systems  Constitutional: Negative for fever, chills and malaise/fatigue.  HENT: Negative for sore throat.   Eyes: Negative for blurred vision.  Respiratory: Negative for cough, hemoptysis, shortness of breath and wheezing.   Cardiovascular: Negative for chest pain, palpitations and leg swelling.  Gastrointestinal: Positive for diarrhea. Negative for nausea, vomiting, abdominal pain and blood in stool.  Genitourinary: Negative for dysuria.  Musculoskeletal: Negative for back pain.  Neurological: Negative for dizziness, tremors and headaches.  Endo/Heme/Allergies: Does not bruise/bleed easily.  Psychiatric/Behavioral: Positive for depression.    Tolerating Diet: Yes      DRUG ALLERGIES:   Allergies  Allergen Reactions  . Almond Oil Hives    VITALS:  Blood pressure 117/78, pulse 73, temperature 98.2 F (36.8 C), temperature source Oral, resp. rate 18, height 5\' 4"  (1.626 m), weight 61.735 kg (136 lb 1.6 oz), SpO2 96 %.  PHYSICAL EXAMINATION:   Physical Exam  Constitutional: She is oriented to person, place, and time and well-developed, well-nourished, and in no distress. No distress.  HENT:  Head: Normocephalic.  Eyes: No scleral icterus.  Neck: Normal range of motion. Neck supple. No JVD present. No tracheal deviation present.  Cardiovascular: Normal rate, regular rhythm and normal heart sounds.  Exam reveals no gallop and no friction rub.   No murmur heard. Pulmonary/Chest: Effort normal and breath  sounds normal. No respiratory distress. She has no wheezes. She has no rales. She exhibits no tenderness.  Abdominal: Soft. Bowel sounds are normal. She exhibits no distension and no mass. There is no tenderness. There is no rebound and no guarding.  Musculoskeletal: Normal range of motion. She exhibits no edema.  Neurological: She is alert and oriented to person, place, and time.  Skin: Skin is warm. No rash noted. No erythema.      LABORATORY PANEL:   CBC  Recent Labs Lab 12/24/14 1142  WBC 11.7*  HGB 14.5  HCT 44.3  PLT 399   ------------------------------------------------------------------------------------------------------------------  Chemistries   Recent Labs Lab 12/24/14 1142 12/25/14 0429  NA 136 138  K 2.4* 3.3*  CL 111 117*  CO2 17* 16*  GLUCOSE 172* 95  BUN 19 14  CREATININE 1.37* 1.24*  CALCIUM 9.4 9.0  AST 13*  --   ALT 9*  --   ALKPHOS 137*  --   BILITOT <0.1*  --    ------------------------------------------------------------------------------------------------------------------  Cardiac Enzymes No results for input(s): TROPONINI in the last 168 hours. ------------------------------------------------------------------------------------------------------------------  RADIOLOGY:  No results found.   ASSESSMENT AND PLAN:   54 year old female with a history of depression and chronic pain syndrome with chronic diarrhea who presented with weakness and found to have hypokalemia.  1. Hypokalemia: This is secondary to diarrhea which has been improved with potassium replacement.   2. Acute on chronic diarrhea: C. difficile was negative in the emergency room. Patient is afebrile. I suspect this is likely related to the pain medications she was receiving. GI consultation has been placed. Patient may be able to  be discharged later today if she has not planning colonoscopy.  3. Chronic pain syndrome: Avoid narcotic medications as the patient has been  requesting pain medications but her pain has been unchanged.   4. Depression: Patient's husband is concerned that patient is depressed. Apparently she's been taking more pain medications and fears that she has substance abuse problems. Psychiatry consultation is in place.  5. Chronic diastolic and his heart failure: Patient is euvolemic.     Management plans discussed with the patient and she is in agreement.  CODE STATUS: FULL  TOTAL TIME TAKING CARE OF THIS PATIENT: 30 minutes.     POSSIBLE D/C today or tomorrow, DEPENDING ON GI and psych consult.  Pearlene Teat M.D on 12/25/2014 at 12:11 PM  Between 7am to 6pm - Pager - [phone] After 6pm go to [email or web address] - password [email or web address]  [facility]  Office  [phone]  CC: Primary care physician; Corky Downs, MD  Note: This dictation was prepared with Dragon dictation along with smaller phrase technology. Any transcriptional errors that result from this process are unintentional.

## 2014-12-25 NOTE — Progress Notes (Signed)
Enoxaparin   Patient qualifies for Enoxaparin 40 mg SQ daily based on CrCl >30 ml/min per policy. Will change to Enoxaparin 40 mg SQ daily.  Portia Wisdom D. Rolland Steinert, PharmD   

## 2014-12-25 NOTE — Consult Note (Signed)
[facility] Face-to-Face Psychiatry Consult   Reason for Consult:  Consult for this 54 year old woman with a history of substance abuse and symptoms of depression currently in the hospital because of diarrhea Referring Physician:  Mody Patient Identification: MY RINKE MRN:  [id number] Principal Diagnosis: Sedative abuse Diagnosis:   Patient Active Problem List   Diagnosis Date Noted  . 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 Loose stools [R19.5]   . Diarrhea [R19.7]   . Hypokalemia [E87.6] 12/24/2014    Total Time spent with patient: 1 hour  Subjective:   Whitney Bernard is a 54 y.o. female patient admitted with "I've just been having diarrhea".  HPI:  Information from the patient and the chart. Chart reviewed. Old notes reviewed. Patient interviewed. Case discussed with nursing. Consult sited concerns about depression as well as substance abuse. To my interview the patient states that she does not feel depressed at all. She says she does have chronic nerve problems but feels that those are under good control right now. She says her main complaint is simply the diarrhea that she is having coming in today. Denies feeling particularly depressed. Denies having significant problems with sleep or appetite. Patient states that she is no longer abusing narcotics. She says that she takes Xanax 0.5 mg twice a day and Paxil and that is all the medicine that she is currently taking. She denies having any suicidal thoughts. Denies homicidal thoughts. Denies hallucinations. Patient denies that she's been abusing any medicines recently. After I started to read to her some of her recent prescriptions from the controlled substance database she feigned surprised that she had forgotten to tell me about the [place]. She claims that she had been taking Suboxone because she just thought on a wind that it might treat her depression. She denies that she's been using any narcotic pain medicine.  Social history: Patient lives with  her husband. Also has 2 sons at home. Doesn't work outside the home.  Medical history: Long history of chronic pain which she attributes to some orthopedic injuries. Also now complaining of chronic diarrhea.  Substance abuse history: Patient has been identified on multiple occasions as a person who abuses prescription drugs including narcotics but not limited to them with concern about abuse of other sedatives and barbiturates as well. Has mostly avoided admitting to substance abuse problems although she now tells me that she was taking Suboxone. This despite the fact that she clearly indicates she has no idea what Suboxone is used for.  Past Psychiatric History: Patient has a history of a diagnosis of depression and has had hospitalizations before. More distant history of suicide attempts.  Risk to Self: Is patient at risk for suicide?: No Risk to Others:   Prior Inpatient Therapy:   Prior Outpatient Therapy:    Past Medical History:  Past Medical History  Diagnosis Date  . Hypertension   . Hypercholesteremia   . RA (rheumatoid arthritis) ([place])   . Depression   . COPD (chronic obstructive pulmonary disease) ([place])   . Headache(784.0)     migraines  . Hypokalemia   . CHF (congestive heart failure) ([place])   . Migraine   . Chronic pain syndrome   . Hyperthyroidism   . Acute pancreatitis   . Narcotic abuse     Past Surgical History  Procedure Laterality Date  . Fracture surgery    . Cholecystectomy    . Cervical polypectomy    . Tubal ligation    . Neck surgery    .  Wisdom tooth extraction    . Anterior cervical decomp/discectomy fusion N/A 11/26/2012    Procedure: ANTERIOR CERVICAL DECOMPRESSION/DISCECTOMY FUSION 2 LEVELS;  Surgeon: Faythe Ghee, MD;  Location: [place] [facility];  Service: Neurosurgery;  Laterality: N/A;  ANTERIOR CERVICAL DECOMPRESSION/DISCECTOMY FUSION 2 LEVELS   Family History:  Family History  Problem Relation Age of Onset  . CAD Father   . Alzheimer's  disease     Family Psychiatric  History: Patient states she had an aunt who had a nervous breakdown 2 cousins with schizophrenia and a son who drinks too much Social History:  History  Alcohol Use  . Yes    Comment: occ     History  Drug Use No    Social History   Social History  . Marital Status: Married    Spouse Name: N/A  . Number of Children: N/A  . Years of Education: N/A   Social History Main Topics  . Smoking status: Former Smoker -- 0.50 packs/day for 38 years    Types: Cigarettes    Quit date: 11/14/2012  . Smokeless tobacco: Never Used  . Alcohol Use: Yes     Comment: occ  . Drug Use: No  . Sexual Activity: Yes    Birth Control/ Protection: Surgical   Other Topics Concern  . None   Social History Narrative   Additional Social History:                          Allergies:   Allergies  Allergen Reactions  . Almond Oil Hives    Labs:  Results for orders placed or performed during the hospital encounter of 12/24/14 (from the past 48 hour(s))  Comprehensive metabolic panel     Status: Abnormal   Collection Time: 12/24/14 11:42 AM  Result Value Ref Range   Sodium 136 135 - 145 mmol/L   Potassium 2.4 (LL) 3.5 - 5.1 mmol/L    Comment: CRITICAL RESULT CALLED TO, READ BACK BY AND VERIFIED WITH: MARSHA [facility] AT 2222 12/24/14.PMH    Chloride 111 101 - 111 mmol/L   CO2 17 (L) 22 - 32 mmol/L   Glucose, Bld 172 (H) 65 - 99 mg/dL   BUN 19 6 - 20 mg/dL   Creatinine, Ser 1.37 (H) 0.44 - 1.00 mg/dL   Calcium 9.4 8.9 - 10.3 mg/dL   Total Protein 7.5 6.5 - 8.1 g/dL   Albumin 3.9 3.5 - 5.0 g/dL   AST 13 (L) 15 - 41 U/L   ALT 9 (L) 14 - 54 U/L   Alkaline Phosphatase 137 (H) 38 - 126 U/L   Total Bilirubin <0.1 (L) 0.3 - 1.2 mg/dL   GFR calc non Af Amer 42 (L) >60 mL/min   GFR calc Af Amer 49 (L) >60 mL/min    Comment: (NOTE) The eGFR has been calculated using the CKD EPI equation. This calculation has not been validated in all clinical  situations. eGFR's persistently <60 mL/min signify possible Chronic Kidney Disease.    Anion gap 8 5 - 15  CBC     Status: Abnormal   Collection Time: 12/24/14 11:42 AM  Result Value Ref Range   WBC 11.7 (H) 3.6 - 11.0 K/uL   RBC 5.07 3.80 - 5.20 MIL/uL   Hemoglobin 14.5 12.0 - 16.0 g/dL   HCT 44.3 35.0 - 47.0 %   MCV 87.4 80.0 - 100.0 fL   MCH 28.6 26.0 - 34.0 pg   MCHC  32.7 32.0 - 36.0 g/dL   RDW 17.0 (H) 11.5 - 14.5 %   Platelets 399 150 - 440 K/uL  TSH     Status: None   Collection Time: 12/24/14 11:42 AM  Result Value Ref Range   TSH 0.743 0.350 - 4.500 uIU/mL  C difficile quick scan w PCR reflex     Status: None   Collection Time: 12/24/14  1:02 PM  Result Value Ref Range   C Diff antigen NEGATIVE NEGATIVE   C Diff toxin NEGATIVE NEGATIVE   C Diff interpretation Negative for C. difficile   MRSA PCR Screening     Status: None   Collection Time: 12/24/14  3:58 PM  Result Value Ref Range   MRSA by PCR NEGATIVE NEGATIVE    Comment:        The GeneXpert MRSA Assay (FDA approved for NASAL specimens only), is one component of a comprehensive MRSA colonization surveillance program. It is not intended to diagnose MRSA infection nor to guide or monitor treatment for MRSA infections.   Urinalysis complete, with microscopic ([facility] only)     Status: Abnormal   Collection Time: 12/24/14 11:27 PM  Result Value Ref Range   Color, Urine YELLOW (A) YELLOW   APPearance HAZY (A) CLEAR   Glucose, UA NEGATIVE NEGATIVE mg/dL   Bilirubin Urine NEGATIVE NEGATIVE   Ketones, ur 1+ (A) NEGATIVE mg/dL   Specific Gravity, Urine 1.027 1.005 - 1.030   Hgb urine dipstick NEGATIVE NEGATIVE   pH 6.0 5.0 - 8.0   Protein, ur 30 (A) NEGATIVE mg/dL   Nitrite NEGATIVE NEGATIVE   Leukocytes, UA 3+ (A) NEGATIVE   RBC / HPF 6-30 0 - 5 RBC/hpf   WBC, UA TOO NUMEROUS TO COUNT 0 - 5 WBC/hpf   Bacteria, UA NONE SEEN NONE SEEN   Squamous Epithelial / LPF 6-30 (A) NONE SEEN   Trans Epithel, UA 4     Mucous PRESENT    Hyaline Casts, UA PRESENT   Basic metabolic panel     Status: Abnormal   Collection Time: 12/25/14  4:29 AM  Result Value Ref Range   Sodium 138 135 - 145 mmol/L   Potassium 3.3 (L) 3.5 - 5.1 mmol/L   Chloride 117 (H) 101 - 111 mmol/L   CO2 16 (L) 22 - 32 mmol/L   Glucose, Bld 95 65 - 99 mg/dL   BUN 14 6 - 20 mg/dL   Creatinine, Ser 1.24 (H) 0.44 - 1.00 mg/dL   Calcium 9.0 8.9 - 10.3 mg/dL   GFR calc non Af Amer 48 (L) >60 mL/min   GFR calc Af Amer 55 (L) >60 mL/min    Comment: (NOTE) The eGFR has been calculated using the CKD EPI equation. This calculation has not been validated in all clinical situations. eGFR's persistently <60 mL/min signify possible Chronic Kidney Disease.    Anion gap 5 5 - 15    Current Facility-Administered Medications  Medication Dose Route Frequency Provider Last Rate Last Dose  . acetaminophen (TYLENOL) tablet 650 mg  650 mg Oral Q6H PRN Hillary Bow, MD       Or  . acetaminophen (TYLENOL) suppository 650 mg  650 mg Rectal Q6H PRN Srikar Sudini, MD      . albuterol (PROVENTIL) (2.5 MG/3ML) 0.083% nebulizer solution 2.5 mg  2.5 mg Nebulization Q2H PRN Srikar Sudini, MD      . ALPRAZolam Duanne Moron) tablet 0.5 mg  0.5 mg Oral BID PRN Hillary Bow, MD  0.5 mg at 12/25/14 1305  . aspirin EC tablet 81 mg  81 mg Oral Daily Hillary Bow, MD   81 mg at 12/25/14 1025  . carisoprodol (SOMA) tablet 350 mg  350 mg Oral TID Hillary Bow, MD   350 mg at 12/25/14 2100  . cefUROXime (CEFTIN) tablet 250 mg  250 mg Oral BID WC Bettey Costa, MD   250 mg at 12/25/14 1707  . cyclobenzaprine (FLEXERIL) tablet 10 mg  10 mg Oral TID PRN Hillary Bow, MD      . enoxaparin (LOVENOX) injection 40 mg  40 mg Subcutaneous Q24H Bettey Costa, MD   40 mg at 12/25/14 2100  . ibuprofen (ADVIL,MOTRIN) tablet 400 mg  400 mg Oral Q6H PRN Srikar Sudini, MD      . Influenza vac split quadrivalent PF (FLUARIX) injection 0.5 mL  0.5 mL Intramuscular Tomorrow-1000 Srikar Sudini,  MD      . loperamide (IMODIUM) capsule 2 mg  2 mg Oral Q6H PRN Hillary Bow, MD   2 mg at 12/25/14 0601  . ondansetron (ZOFRAN) tablet 4 mg  4 mg Oral Q6H PRN Hillary Bow, MD   4 mg at 12/25/14 1305   Or  . ondansetron (ZOFRAN) injection 4 mg  4 mg Intravenous Q6H PRN Hillary Bow, MD   4 mg at 12/25/14 2100  . PARoxetine (PAXIL) tablet 40 mg  40 mg Oral Daily Hillary Bow, MD   40 mg at 12/25/14 1024  . sodium chloride 0.9 % injection 3 mL  3 mL Intravenous Q12H Hillary Bow, MD   3 mL at 12/25/14 2102  . traMADol (ULTRAM) tablet 100 mg  100 mg Oral Q6H PRN Hillary Bow, MD   100 mg at 12/25/14 2100    Musculoskeletal: Strength & Muscle Tone: within normal limits Gait & Station: unsteady Patient leans: N/A  Psychiatric Specialty Exam: Review of Systems  HENT: Negative.   Eyes: Negative.   Respiratory: Negative.   Cardiovascular: Negative.   Gastrointestinal: Positive for diarrhea.  Musculoskeletal: Negative.   Skin: Negative.   Neurological: Positive for weakness.  Psychiatric/Behavioral: Negative for depression, suicidal ideas, hallucinations, memory loss and substance abuse. The patient is not nervous/anxious and does not have insomnia.     Blood pressure 117/62, pulse 73, temperature 98.5 F (36.9 C), temperature source Oral, resp. rate 18, height '5\' 4"'  (1.626 m), weight 61.735 kg (136 lb 1.6 oz), SpO2 98 %.Body mass index is 23.35 kg/(m^2).  General Appearance: Disheveled  Eye Contact::  Good  Speech:  Slow  Volume:  Decreased  Mood:  Euthymic  Affect:  Congruent  Thought Process:  Tangential  Orientation:  Full (Time, Place, and Person)  Thought Content:  Negative  Suicidal Thoughts:  No  Homicidal Thoughts:  No  Memory:  Immediate;   Good Recent;   Fair Remote;   Poor  Judgement:  Impaired  Insight:  Shallow  Psychomotor Activity:  Decreased  Concentration:  Fair  Recall:  [place]: Fair  Akathisia:  No  Handed:  Right   AIMS (if indicated):     Assets:  Communication Skills Social Support  ADL's:  Intact  Cognition: WNL  Sleep:      Treatment Plan Summary: Plan The patient's history should be viewed in light of what we can see about her recent prescriptions. She claims that she is prescribed Xanax on a regular basis by her primary care doctor. Controlled substance database shows no evidence at all of  her receiving alprazolam any time in the last 6 months. She claimed to me that she does not use narcotic pain medicines but I can identify at least 2 prescriptions for narcotics since September 30 from 2 different providers. He continues to take Soma on a regular basis and has had scattered other prescriptions of medicines such as butalbital. Strain just is the multiple prescriptions for Suboxone some of which overlap with prescriptions for narcotic pain medicine. Suboxone really has no use except to treat opiate dependence and yet she claims that she was taking it for depression and anxiety. Taking Suboxone while taking narcotics at the same time renders the narcotics useless or possibly even throws a person into withdrawal making it unlikely that she was actually taking these medicines together.Case reviewed with nursing. Patient is denying any depression. To the extent that she has an anxiety problem it is never going to get any better until she faces her problem with abuse of medications but right now she has no interest in admitting to that are dealing with it. She is not committable not suicidal and does not require inpatient psychiatric treatment or any other change to her medication. Recommend that she be treated medically and then a discharge. She should follow-up with her primary care doctor. She also can be referred back to [facility] where she has obviously been seen recently.   Disposition: Patient does not meet criteria for psychiatric inpatient admission. Supportive therapy provided about ongoing  stressors.  Cionna Collantes 12/25/2014 9:30 PM

## 2014-12-26 ENCOUNTER — Encounter: Payer: Self-pay | Admitting: Anesthesiology

## 2014-12-26 ENCOUNTER — Observation Stay: Payer: 59 | Admitting: Anesthesiology

## 2014-12-26 ENCOUNTER — Encounter
Admission: EM | Disposition: A | Discharge status: Home / Self Care | Payer: Self-pay | Source: Home / Self Care | Attending: Internal Medicine

## 2014-12-26 DIAGNOSIS — I110 Hypertensive heart disease with heart failure: Secondary | ICD-10-CM | POA: Diagnosis present

## 2014-12-26 DIAGNOSIS — F329 Major depressive disorder, single episode, unspecified: Secondary | ICD-10-CM | POA: Diagnosis present

## 2014-12-26 DIAGNOSIS — E43 Unspecified severe protein-calorie malnutrition: Secondary | ICD-10-CM | POA: Diagnosis present

## 2014-12-26 DIAGNOSIS — Z915 Personal history of self-harm: Secondary | ICD-10-CM | POA: Diagnosis not present

## 2014-12-26 DIAGNOSIS — F1310 Sedative, hypnotic or anxiolytic abuse, uncomplicated: Secondary | ICD-10-CM | POA: Diagnosis not present

## 2014-12-26 DIAGNOSIS — E7800 Pure hypercholesterolemia, unspecified: Secondary | ICD-10-CM | POA: Diagnosis present

## 2014-12-26 DIAGNOSIS — K529 Noninfective gastroenteritis and colitis, unspecified: Secondary | ICD-10-CM | POA: Diagnosis present

## 2014-12-26 DIAGNOSIS — R197 Diarrhea, unspecified: Secondary | ICD-10-CM | POA: Diagnosis not present

## 2014-12-26 DIAGNOSIS — F419 Anxiety disorder, unspecified: Secondary | ICD-10-CM | POA: Diagnosis present

## 2014-12-26 DIAGNOSIS — M069 Rheumatoid arthritis, unspecified: Secondary | ICD-10-CM | POA: Diagnosis present

## 2014-12-26 DIAGNOSIS — Z87891 Personal history of nicotine dependence: Secondary | ICD-10-CM | POA: Diagnosis not present

## 2014-12-26 DIAGNOSIS — I5032 Chronic diastolic (congestive) heart failure: Secondary | ICD-10-CM | POA: Diagnosis present

## 2014-12-26 DIAGNOSIS — Z6820 Body mass index (BMI) 20.0-20.9, adult: Secondary | ICD-10-CM | POA: Diagnosis not present

## 2014-12-26 DIAGNOSIS — E860 Dehydration: Secondary | ICD-10-CM | POA: Diagnosis present

## 2014-12-26 DIAGNOSIS — G8929 Other chronic pain: Secondary | ICD-10-CM | POA: Diagnosis present

## 2014-12-26 DIAGNOSIS — G894 Chronic pain syndrome: Secondary | ICD-10-CM | POA: Diagnosis present

## 2014-12-26 DIAGNOSIS — J449 Chronic obstructive pulmonary disease, unspecified: Secondary | ICD-10-CM | POA: Diagnosis present

## 2014-12-26 DIAGNOSIS — E876 Hypokalemia: Secondary | ICD-10-CM | POA: Diagnosis present

## 2014-12-26 DIAGNOSIS — Z8249 Family history of ischemic heart disease and other diseases of the circulatory system: Secondary | ICD-10-CM | POA: Diagnosis not present

## 2014-12-26 DIAGNOSIS — Z79899 Other long term (current) drug therapy: Secondary | ICD-10-CM | POA: Diagnosis not present

## 2014-12-26 DIAGNOSIS — Z820 Family history of epilepsy and other diseases of the nervous system: Secondary | ICD-10-CM | POA: Diagnosis not present

## 2014-12-26 HISTORY — PX: ESOPHAGOGASTRODUODENOSCOPY: SHX5428

## 2014-12-26 LAB — CREATININE, SERUM
Creatinine, Ser: 1.18 mg/dL — ABNORMAL HIGH (ref 0.44–1.00)
GFR calc Af Amer: 59 mL/min — ABNORMAL LOW (ref >60)
GFR, EST NON AFRICAN AMERICAN: 51 mL/min — AB (ref >60)

## 2014-12-26 LAB — MAGNESIUM: MAGNESIUM: 2 mg/dL (ref 1.7–2.4)

## 2014-12-26 LAB — POTASSIUM
POTASSIUM: 2.7 mmol/L — AB (ref 3.5–5.1)
POTASSIUM: 2.8 mmol/L — AB (ref 3.5–5.1)
Potassium: 2.8 mmol/L — CL (ref 3.5–5.1)

## 2014-12-26 SURGERY — EGD (ESOPHAGOGASTRODUODENOSCOPY)
Anesthesia: Moderate Sedation

## 2014-12-26 SURGERY — COLONOSCOPY WITH PROPOFOL
Anesthesia: General

## 2014-12-26 MED ORDER — POTASSIUM CHLORIDE CRYS ER 20 MEQ PO TBCR
40.0000 meq | EXTENDED_RELEASE_TABLET | Freq: Once | ORAL | Status: AC
  Administered 2014-12-26: 40 meq via ORAL

## 2014-12-26 MED ORDER — MIDAZOLAM HCL 5 MG/5ML IJ SOLN
INTRAMUSCULAR | Status: DC | PRN
  Administered 2014-12-26: 1 mg via INTRAVENOUS
  Administered 2014-12-26: 2 mg via INTRAVENOUS
  Administered 2014-12-26: 1 mg via INTRAVENOUS
  Administered 2014-12-26 (×3): 2 mg via INTRAVENOUS

## 2014-12-26 MED ORDER — POTASSIUM CHLORIDE 10 MEQ/100ML IV SOLN
10.0000 meq | INTRAVENOUS | Status: AC
  Administered 2014-12-26 (×3): 10 meq via INTRAVENOUS
  Filled 2014-12-26 (×8): qty 100

## 2014-12-26 MED ORDER — FENTANYL CITRATE (PF) 100 MCG/2ML IJ SOLN
INTRAMUSCULAR | Status: DC | PRN
  Administered 2014-12-26 (×4): 50 ug via INTRAVENOUS

## 2014-12-26 MED ORDER — SODIUM CHLORIDE 0.9 % IV SOLN
INTRAVENOUS | Status: DC
  Administered 2014-12-26 (×2): via INTRAVENOUS

## 2014-12-26 MED ORDER — POTASSIUM CHLORIDE CRYS ER 20 MEQ PO TBCR
40.0000 meq | EXTENDED_RELEASE_TABLET | Freq: Once | ORAL | Status: AC
  Administered 2014-12-27: 40 meq via ORAL
  Filled 2014-12-26 (×2): qty 2

## 2014-12-26 NOTE — Progress Notes (Signed)
Received call from lab stating critical value of 2.8 K+.  Pt on telemetry and asymptomatic.  Pt continuously has supplementation K+ running in IV at this time.    Dr. Servando Snare informed and will proceed with colonoscopy.

## 2014-12-26 NOTE — Progress Notes (Signed)
[facility] - [place] at [facility]   PATIENT NAME: Whitney Bernard    MR#:  [id number]  DATE OF BIRTH:  [birth year]  SUBJECTIVE:  Patient prepped for colonoscopy this am. Potassium was low and is being repleted through IV. She is upset that she may not have c/s today.  REVIEW OF SYSTEMS:    Review of Systems  Constitutional: Negative for fever, chills and malaise/fatigue.  HENT: Negative for sore throat.   Eyes: Negative for blurred vision.  Respiratory: Negative for cough, hemoptysis, shortness of breath and wheezing.   Cardiovascular: Negative for chest pain, palpitations and leg swelling.  Gastrointestinal: Positive for abdominal pain and diarrhea. Negative for nausea, vomiting and blood in stool.  Genitourinary: Negative for dysuria.  Musculoskeletal: Negative for back pain.  Neurological: Negative for dizziness, tremors and headaches.  Endo/Heme/Allergies: Does not bruise/bleed easily.    Tolerating Diet: Yes      DRUG ALLERGIES:   Allergies  Allergen Reactions  . Almond Oil Hives    VITALS:  Blood pressure 120/62, pulse 79, temperature 98.4 F (36.9 C), temperature source Oral, resp. rate 18, height 5\' 4"  (1.626 m), weight 61.735 kg (136 lb 1.6 oz), SpO2 99 %.  PHYSICAL EXAMINATION:   Physical Exam  Constitutional: She is oriented to person, place, and time and well-developed, well-nourished, and in no distress. No distress.  HENT:  Head: Normocephalic.  Eyes: No scleral icterus.  Neck: Normal range of motion. Neck supple. No JVD present. No tracheal deviation present.  Cardiovascular: Normal rate, regular rhythm and normal heart sounds.  Exam reveals no gallop and no friction rub.   No murmur heard. Pulmonary/Chest: Effort normal and breath sounds normal. No respiratory distress. She has no wheezes. She has no rales. She exhibits no tenderness.  Abdominal: Soft. Bowel sounds are normal. She exhibits no distension and no mass. There is no  tenderness. There is no rebound and no guarding.  Musculoskeletal: Normal range of motion. She exhibits no edema.  Neurological: She is alert and oriented to person, place, and time.  Skin: Skin is warm. No rash noted. No erythema.      LABORATORY PANEL:   CBC  Recent Labs Lab 12/24/14 1142  WBC 11.7*  HGB 14.5  HCT 44.3  PLT 399   ------------------------------------------------------------------------------------------------------------------  Chemistries   Recent Labs Lab 12/24/14 1142 12/25/14 0429 12/26/14 0548  NA 136 138  --   K 2.4* 3.3* 2.7*  CL 111 117*  --   CO2 17* 16*  --   GLUCOSE 172* 95  --   BUN 19 14  --   CREATININE 1.37* 1.24* 1.18*  CALCIUM 9.4 9.0  --   AST 13*  --   --   ALT 9*  --   --   ALKPHOS 137*  --   --   BILITOT <0.1*  --   --    ------------------------------------------------------------------------------------------------------------------  Cardiac Enzymes No results for input(s): TROPONINI in the last 168 hours. ------------------------------------------------------------------------------------------------------------------  RADIOLOGY:  No results found.   ASSESSMENT AND PLAN:   54 year old female with a history of depression and chronic pain syndrome with chronic diarrhea who presented with weakness and found to have hypokalemia.  1. Hypokalemia: This is secondary to diarrhea. Potassium is being repleted and rechecked along with Mg.  2. Acute on chronic diarrhea: C. difficile was negative in the emergency room.  Plan for colonoscopy once K has improved.   3. Chronic pain syndrome: Avoid narcotic medications as  the patient has been requesting pain medications but her pain has been unchanged.   4. Depression : Psych consult appreciated. As per psychiatry consultation she should follow up with the primary care physician and can also be referred back to [facility].  5. Chronic diastolic and his heart failure: Patient is  euvolemic.     Management plans discussed with the patient and she is in agreement.  CODE STATUS: FULL  TOTAL TIME TAKING CARE OF THIS PATIENT: 25 minutes.     POSSIBLE D/C tomorrow, DEPENDING ON colonscopy .  Tishawna Larouche M.D on 12/26/2014 at 3:23 PM  Between 7am to 6pm - Pager - [phone] After 6pm go to [email or web address] - password [email or web address]  [facility]  Office  [phone]  CC: Primary care physician; Corky Downs, MD  Note: This dictation was prepared with Dragon dictation along with smaller phrase technology. Any transcriptional errors that result from this process are unintentional.

## 2014-12-26 NOTE — Consult Note (Signed)
[facility] Face-to-Face Psychiatry Consult   Reason for Consult:  Consult for this 54 year old woman with a history of substance abuse and symptoms of depression currently in the hospital because of diarrhea Referring Physician:  Mody Patient Identification: Whitney Bernard MRN:  [id number] Principal Diagnosis: Sedative abuse Diagnosis:   Patient Active Problem List   Diagnosis Date Noted  . Protein-calorie malnutrition, severe [E43] 12/26/2014  . 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 Loose stools [R19.5]   . Diarrhea [R19.7]   . Hypokalemia [E87.6] 12/24/2014    Total Time spent with patient: 1 hour  Subjective:   Whitney Bernard is a 54 y.o. female patient admitted with "I've just been having diarrhea".  HPI:  Information from the patient and the chart. Chart reviewed. Old notes reviewed. Patient interviewed. Case discussed with nursing. Consult sited concerns about depression as well as substance abuse. To my interview the patient states that she does not feel depressed at all. She says she does have chronic nerve problems but feels that those are under good control right now. She says her main complaint is simply the diarrhea that she is having coming in today. Denies feeling particularly depressed. Denies having significant problems with sleep or appetite. Patient states that she is no longer abusing narcotics. She says that she takes Xanax 0.5 mg twice a day and Paxil and that is all the medicine that she is currently taking. She denies having any suicidal thoughts. Denies homicidal thoughts. Denies hallucinations. Patient denies that she's been abusing any medicines recently. After I started to read to her some of her recent prescriptions from the controlled substance database she feigned surprised that she had forgotten to tell me about the [place]. She claims that she had been taking Suboxone because she just thought on a wind that it might treat her depression. She denies that she's been using any  narcotic pain medicine.  Social history: Patient lives with her husband. Also has 2 sons at home. Doesn't work outside the home.  Medical history: Long history of chronic pain which she attributes to some orthopedic injuries. Also now complaining of chronic diarrhea.  Substance abuse history: Patient has been identified on multiple occasions as a person who abuses prescription drugs including narcotics but not limited to them with concern about abuse of other sedatives and barbiturates as well. Has mostly avoided admitting to substance abuse problems although she now tells me that she was taking Suboxone. This despite the fact that she clearly indicates she has no idea what Suboxone is used for.  Past Psychiatric History: Patient has a history of a diagnosis of depression and has had hospitalizations before. More distant history of suicide attempts.  Follow-up for this 54 year old woman with a history of multiple medication abuse and anxiety. She had her colonoscopy today and it appears to of turned out to be completely negative. Patient is still complaining of anxiety but does not appear to be agitated anxious or in any distress.  Risk to Self: Is patient at risk for suicide?: No Risk to Others:   Prior Inpatient Therapy:   Prior Outpatient Therapy:    Past Medical History:  Past Medical History  Diagnosis Date  . Hypertension   . Hypercholesteremia   . RA (rheumatoid arthritis) ([place])   . Depression   . COPD (chronic obstructive pulmonary disease) ([place])   . Headache(784.0)     migraines  . Hypokalemia   . CHF (congestive heart failure) ([place])   . Migraine   .  Chronic pain syndrome   . Hyperthyroidism   . Acute pancreatitis   . Narcotic abuse     Past Surgical History  Procedure Laterality Date  . Fracture surgery    . Cholecystectomy    . Cervical polypectomy    . Tubal ligation    . Neck surgery    . Wisdom tooth extraction    . Anterior cervical decomp/discectomy fusion  N/A 11/26/2012    Procedure: ANTERIOR CERVICAL DECOMPRESSION/DISCECTOMY FUSION 2 LEVELS;  Surgeon: Faythe Ghee, MD;  Location: [facility];  Service: Neurosurgery;  Laterality: N/A;  ANTERIOR CERVICAL DECOMPRESSION/DISCECTOMY FUSION 2 LEVELS   Family History:  Family History  Problem Relation Age of Onset  . CAD Father   . Alzheimer's disease     Family Psychiatric  History: Patient states she had an aunt who had a nervous breakdown 2 cousins with schizophrenia and a son who drinks too much Social History:  History  Alcohol Use  . Yes    Comment: occ     History  Drug Use No    Social History   Social History  . Marital Status: Married    Spouse Name: N/A  . Number of Children: N/A  . Years of Education: N/A   Social History Main Topics  . Smoking status: Former Smoker -- 0.50 packs/day for 38 years    Types: Cigarettes    Quit date: 11/14/2012  . Smokeless tobacco: Never Used  . Alcohol Use: Yes     Comment: occ  . Drug Use: No  . Sexual Activity: Yes    Birth Control/ Protection: Surgical   Other Topics Concern  . None   Social History Narrative   Additional Social History:                          Allergies:   Allergies  Allergen Reactions  . Almond Oil Hives    Labs:  Results for orders placed or performed during the hospital encounter of 12/24/14 (from the past 48 hour(s))  Urinalysis complete, with microscopic ([facility] only)     Status: Abnormal   Collection Time: 12/24/14 11:27 PM  Result Value Ref Range   Color, Urine YELLOW (A) YELLOW   APPearance HAZY (A) CLEAR   Glucose, UA NEGATIVE NEGATIVE mg/dL   Bilirubin Urine NEGATIVE NEGATIVE   Ketones, ur 1+ (A) NEGATIVE mg/dL   Specific Gravity, Urine 1.027 1.005 - 1.030   Hgb urine dipstick NEGATIVE NEGATIVE   pH 6.0 5.0 - 8.0   Protein, ur 30 (A) NEGATIVE mg/dL   Nitrite NEGATIVE NEGATIVE   Leukocytes, UA 3+ (A) NEGATIVE   RBC / HPF 6-30 0 - 5 RBC/hpf   WBC, UA TOO NUMEROUS TO  COUNT 0 - 5 WBC/hpf   Bacteria, UA NONE SEEN NONE SEEN   Squamous Epithelial / LPF 6-30 (A) NONE SEEN   Trans Epithel, UA 4    Mucous PRESENT    Hyaline Casts, UA PRESENT   Basic metabolic panel     Status: Abnormal   Collection Time: 12/25/14  4:29 AM  Result Value Ref Range   Sodium 138 135 - 145 mmol/L   Potassium 3.3 (L) 3.5 - 5.1 mmol/L   Chloride 117 (H) 101 - 111 mmol/L   CO2 16 (L) 22 - 32 mmol/L   Glucose, Bld 95 65 - 99 mg/dL   BUN 14 6 - 20 mg/dL   Creatinine, Ser 1.24 (H) 0.44 -  1.00 mg/dL   Calcium 9.0 8.9 - 10.3 mg/dL   GFR calc non Af Amer 48 (L) >60 mL/min   GFR calc Af Amer 55 (L) >60 mL/min    Comment: (NOTE) The eGFR has been calculated using the CKD EPI equation. This calculation has not been validated in all clinical situations. eGFR's persistently <60 mL/min signify possible Chronic Kidney Disease.    Anion gap 5 5 - 15  Creatinine, serum     Status: Abnormal   Collection Time: 12/26/14  5:48 AM  Result Value Ref Range   Creatinine, Ser 1.18 (H) 0.44 - 1.00 mg/dL   GFR calc non Af Amer 51 (L) >60 mL/min   GFR calc Af Amer 59 (L) >60 mL/min    Comment: (NOTE) The eGFR has been calculated using the CKD EPI equation. This calculation has not been validated in all clinical situations. eGFR's persistently <60 mL/min signify possible Chronic Kidney Disease.   Potassium     Status: Abnormal   Collection Time: 12/26/14  5:48 AM  Result Value Ref Range   Potassium 2.7 (LL) 3.5 - 5.1 mmol/L    Comment: CRITICAL RESULT CALLED TO, READ BACK BY AND VERIFIED WITH TONYA CRANFORD @ 1059 12/26/14 TCH   Potassium     Status: Abnormal   Collection Time: 12/26/14  4:12 PM  Result Value Ref Range   Potassium 2.8 (LL) 3.5 - 5.1 mmol/L    Comment: CRITICAL RESULT CALLED TO, READ BACK BY AND VERIFIED WITH  TONIA CRANFORD AT 1642 12/26/14 SDR   Magnesium     Status: None   Collection Time: 12/26/14  4:12 PM  Result Value Ref Range   Magnesium 2.0 1.7 - 2.4 mg/dL     Current Facility-Administered Medications  Medication Dose Route Frequency Provider Last Rate Last Dose  . 0.9 %  sodium chloride infusion   Intravenous Continuous Max Sane, MD 75 mL/hr at 12/26/14 1245    . acetaminophen (TYLENOL) tablet 650 mg  650 mg Oral Q6H PRN Hillary Bow, MD       Or  . acetaminophen (TYLENOL) suppository 650 mg  650 mg Rectal Q6H PRN Srikar Sudini, MD      . albuterol (PROVENTIL) (2.5 MG/3ML) 0.083% nebulizer solution 2.5 mg  2.5 mg Nebulization Q2H PRN Srikar Sudini, MD      . ALPRAZolam Duanne Moron) tablet 0.5 mg  0.5 mg Oral BID PRN Hillary Bow, MD   0.5 mg at 12/26/14 1327  . aspirin EC tablet 81 mg  81 mg Oral Daily Hillary Bow, MD   81 mg at 12/26/14 1025  . carisoprodol (SOMA) tablet 350 mg  350 mg Oral TID Hillary Bow, MD   350 mg at 12/26/14 1633  . cefUROXime (CEFTIN) tablet 250 mg  250 mg Oral BID WC Bettey Costa, MD   250 mg at 12/26/14 1633  . cyclobenzaprine (FLEXERIL) tablet 10 mg  10 mg Oral TID PRN Hillary Bow, MD      . enoxaparin (LOVENOX) injection 40 mg  40 mg Subcutaneous Q24H Bettey Costa, MD   40 mg at 12/25/14 2100  . ibuprofen (ADVIL,MOTRIN) tablet 400 mg  400 mg Oral Q6H PRN Srikar Sudini, MD      . Influenza vac split quadrivalent PF (FLUARIX) injection 0.5 mL  0.5 mL Intramuscular Tomorrow-1000 Srikar Sudini, MD      . loperamide (IMODIUM) capsule 2 mg  2 mg Oral Q6H PRN Hillary Bow, MD   2 mg at 12/25/14 0601  .  ondansetron (ZOFRAN) tablet 4 mg  4 mg Oral Q6H PRN Hillary Bow, MD   4 mg at 12/26/14 1514   Or  . ondansetron (ZOFRAN) injection 4 mg  4 mg Intravenous Q6H PRN Hillary Bow, MD   4 mg at 12/25/14 2100  . PARoxetine (PAXIL) tablet 40 mg  40 mg Oral Daily Hillary Bow, MD   40 mg at 12/26/14 1025  . potassium chloride 10 mEq in 100 mL IVPB  10 mEq Intravenous Q1 Hr x 6 Vipul Shah, MD   10 mEq at 12/26/14 1827  . sodium chloride 0.9 % injection 3 mL  3 mL Intravenous Q12H Srikar Sudini, MD   3 mL at 12/26/14 1025  .  traMADol (ULTRAM) tablet 100 mg  100 mg Oral Q6H PRN Hillary Bow, MD   100 mg at 12/26/14 1514    Musculoskeletal: Strength & Muscle Tone: within normal limits Gait & Station: unsteady Patient leans: N/A  Psychiatric Specialty Exam: Review of Systems  HENT: Negative.   Eyes: Negative.   Respiratory: Negative.   Cardiovascular: Negative.   Gastrointestinal: Positive for diarrhea.  Musculoskeletal: Negative.   Skin: Negative.   Neurological: Positive for weakness.  Psychiatric/Behavioral: Negative for depression, suicidal ideas, hallucinations, memory loss and substance abuse. The patient is not nervous/anxious and does not have insomnia.     Blood pressure 117/70, pulse 80, temperature 98.1 F (36.7 C), temperature source Oral, resp. rate 16, height '5\' 4"'  (1.626 m), weight 61.735 kg (136 lb 1.6 oz), SpO2 100 %.Body mass index is 23.35 kg/(m^2).  General Appearance: Disheveled  Eye Contact::  Good  Speech:  Slow  Volume:  Decreased  Mood:  Euthymic  Affect:  Congruent  Thought Process:  Tangential  Orientation:  Full (Time, Place, and Person)  Thought Content:  Negative  Suicidal Thoughts:  No  Homicidal Thoughts:  No  Memory:  Immediate;   Good Recent;   Fair Remote;   Poor  Judgement:  Impaired  Insight:  Shallow  Psychomotor Activity:  Decreased  Concentration:  Fair  Recall:  [place]: Fair  Akathisia:  No  Handed:  Right  AIMS (if indicated):     Assets:  Communication Skills Social Support  ADL's:  Intact  Cognition: WNL  Sleep:      Treatment Plan Summary: Plan The patient's history should be viewed in light of what we can see about her recent prescriptions. She claims that she is prescribed Xanax on a regular basis by her primary care doctor. Controlled substance database shows no evidence at all of her receiving alprazolam any time in the last 6 months. She claimed to me that she does not use narcotic pain medicines but I can  identify at least 2 prescriptions for narcotics since September 30 from 2 different providers. He continues to take Soma on a regular basis and has had scattered other prescriptions of medicines such as butalbital. Strain just is the multiple prescriptions for Suboxone some of which overlap with prescriptions for narcotic pain medicine. Suboxone really has no use except to treat opiate dependence and yet she claims that she was taking it for depression and anxiety. Taking Suboxone while taking narcotics at the same time renders the narcotics useless or possibly even throws a person into withdrawal making it unlikely that she was actually taking these medicines together.Case reviewed with nursing. Patient is denying any depression. To the extent that she has an anxiety problem it is never going to  get any better until she faces her problem with abuse of medications but right now she has no interest in admitting to that are dealing with it. She is not committable not suicidal and does not require inpatient psychiatric treatment or any other change to her medication. Recommend that she be treated medically and then a discharge. She should follow-up with her primary care doctor. She also can be referred back to [facility] where she has obviously been seen recently.    No change to my assessment or plan. I would not stop her Xanax here in the hospital as it would just result in more agitation on her part however I would not recommend giving her a prescription for it at discharge. Patient really needs to follow-up with appropriate outpatient psychiatry treatment and should be referred back to [facility].  Disposition: Patient does not meet criteria for psychiatric inpatient admission. Supportive therapy provided about ongoing stressors.  Leland Raver 12/26/2014 7:18 PM

## 2014-12-26 NOTE — Op Note (Signed)
[facility] Patient Name: Whitney Bernard Procedure Date: 12/26/2014 4:56 PM MRN: [id number] Account #: [id number] Date of Birth: 07/17/1960 Admit Type: Inpatient Age: 54 Room: [facility] ENDO ROOM 4 Gender: Female Note Status: Finalized Procedure:         Colonoscopy Indications:       Chronic diarrhea Providers:         Midge Minium, MD Referring MD:      Corky Downs, MD (Referring MD) Medicines:         Propofol per Anesthesia Complications:     No immediate complications. Procedure:         Pre-Anesthesia Assessment:                    - Prior to the procedure, a History and Physical was                     performed, and patient medications and allergies were                     reviewed. The patient's tolerance of previous anesthesia                     was also reviewed. The risks and benefits of the procedure                     and the sedation options and risks were discussed with the                     patient. All questions were answered, and informed consent                     was obtained. Prior Anticoagulants: The patient has taken                     no previous anticoagulant or antiplatelet agents. ASA                     Grade Assessment: II - A patient with mild systemic                     disease. After reviewing the risks and benefits, the                     patient was deemed in satisfactory condition to undergo                     the procedure.                    After obtaining informed consent, the colonoscope was                     passed under direct vision. Throughout the procedure, the                     patient's blood pressure, pulse, and oxygen saturations                     were monitored continuously. The Colonoscope was                     introduced through the anus and advanced to the the cecum,  identified by appendiceal orifice and ileocecal valve. The                     colonoscopy was  performed without difficulty. The patient                     tolerated the procedure well. The quality of the bowel                     preparation was excellent. Findings:      The perianal and digital rectal examinations were normal.      The colon (entire examined portion) appeared normal. Random biopsies       were obtained in the entire colon with cold forceps for histology. Impression:        - The entire examined colon is normal.                    - Random biopsies were obtained in the entire colon. Recommendation:    - Await pathology results. Procedure Code(s): --- Professional ---                    [phone], Colonoscopy, flexible; with biopsy, single or                     multiple Diagnosis Code(s): --- Professional ---                    K52.9, Noninfective gastroenteritis and colitis,                     unspecified CPT copyright 2014 American Medical Association. All rights reserved. The codes documented in this report are preliminary and upon coder review may  be revised to meet current compliance requirements. Midge Minium, MD 12/26/2014 5:34:50 PM This report has been signed electronically. Number of Addenda: 0 Note Initiated On: 12/26/2014 4:56 PM Scope Withdrawal Time: 0 hours 5 minutes 5 seconds  Total Procedure Duration: 0 hours 13 minutes 39 seconds       [facility]

## 2014-12-26 NOTE — Anesthesia Preprocedure Evaluation (Deleted)
Anesthesia Evaluation  Patient identified by MRN, date of birth, ID band Patient awake    Reviewed: Allergy & Precautions, NPO status , Patient's Chart, lab work & pertinent test results, reviewed documented beta blocker date and time   Airway Mallampati: II  TM Distance: >3 FB     Dental  (+) Chipped   Pulmonary former smoker,           Cardiovascular hypertension, +CHF       Neuro/Psych  Headaches, PSYCHIATRIC DISORDERS Depression    GI/Hepatic   Endo/Other    Renal/GU      Musculoskeletal  (+) Arthritis ,   Abdominal   Peds  Hematology   Anesthesia Other Findings   Reproductive/Obstetrics                             Anesthesia Physical Anesthesia Plan  ASA: III  Anesthesia Plan: General   Post-op Pain Management:    Induction:   Airway Management Planned: Nasal Cannula  Additional Equipment:   Intra-op Plan:   Post-operative Plan:   Informed Consent: I have reviewed the patients History and Physical, chart, labs and discussed the procedure including the risks, benefits and alternatives for the proposed anesthesia with the patient or authorized representative who has indicated his/her understanding and acceptance.     Plan Discussed with: CRNA  Anesthesia Plan Comments:         Anesthesia Quick Evaluation

## 2014-12-26 NOTE — Progress Notes (Signed)
Informed by lab tech that the pt has a critical K+ value of 2.7.  Dr. Sherryll Burger informed.  Orders given.  Pt procedure for colonoscopy d/c today based on these results.  Will follow through with order and will continue to monitor and notify MD of any changes.

## 2014-12-26 NOTE — Discharge Summary (Addendum)
[facility] - [place] at [facility]   PATIENT NAME: Whitney Bernard    MR#:  [id number]  DATE OF BIRTH:  1960/03/01  DATE OF ADMISSION:  12/24/2014 ADMITTING PHYSICIAN: Milagros Loll, MD  DATE OF DISCHARGE: 12/27/2014   PRIMARY CARE PHYSICIAN: MASOUD,JAVED, MD    ADMISSION DIAGNOSIS:  Dehydration [E86.0] Loose stools [R19.5] Hypokalemia due to loss of potassium [E87.6]  DISCHARGE DIAGNOSIS:  Principal Problem:   Sedative abuse Active Problems:   Hypokalemia   Loose stools   Diarrhea   Protein-calorie malnutrition, severe  SECONDARY DIAGNOSIS:   Past Medical History  Diagnosis Date  . Hypertension   . Hypercholesteremia   . RA (rheumatoid arthritis) (HCC)   . Depression   . COPD (chronic obstructive pulmonary disease) (HCC)   . Headache(784.0)     migraines  . Hypokalemia   . CHF (congestive heart failure) (HCC)   . Migraine   . Chronic pain syndrome   . Hyperthyroidism   . Acute pancreatitis   . Narcotic abuse    HOSPITAL COURSE:    54 year old female with a history of depression and chronic pain syndrome with chronic diarrhea who presented with weakness and found to have hypokalemia.  1. Hypokalemia: This is secondary to diarrhea. Potassium is being repleted. She has extra K pills at home  2. Acute on chronic diarrhea: C. difficile was negative in the emergency room.  She underwent colonoscopy and has been normal. She should try an elimination diet starting with lactose and can also consider gluten free diet.  3. Chronic pain syndrome: We tried to avoid narcotic medications as the patient has been requesting pain medications but her pain has been unchanged.   4. Depression : Psych consult was appreciated. As per psychiatry consultation she should follow up with the primary care physician and can also be referred back to [facility]. They did not recommend any new medications.  5. Chronic diastolic and his heart failure: Patient is well  compensated at this time.  DISCHARGE CONDITIONS AND DIET:  Stable Cardiac diet CONSULTS OBTAINED:  Treatment Team:  Midge Minium, MD Audery Amel, MD  DRUG ALLERGIES:   Allergies  Allergen Reactions  . Almond Oil Hives    DISCHARGE MEDICATIONS:   Current Discharge Medication List    CONTINUE these medications which have NOT CHANGED   Details  ALPRAZolam (XANAX) 0.5 MG tablet Take 0.5 mg by mouth 2 (two) times daily. Panic attacks    busPIRone (BUSPAR) 5 MG tablet Take 5 mg by mouth 2 (two) times daily. Refills: 1    butalbital-acetaminophen-caffeine (FIORICET, ESGIC) 50-325-40 MG per tablet Take 1 tablet by mouth as needed. Headaches    carisoprodol (SOMA) 350 MG tablet Take 350 mg by mouth 2 (two) times daily as needed. For muscle relaxation. Refills: 1    furosemide (LASIX) 40 MG tablet Take 40 mg by mouth daily.    PARoxetine (PAXIL) 40 MG tablet Take 40 mg by mouth daily.    potassium chloride SA (K-DUR,KLOR-CON) 20 MEQ tablet Take 20 mEq by mouth 3 (three) times daily.       STOP taking these medications     ibuprofen (ADVIL,MOTRIN) 200 MG tablet      cyclobenzaprine (FLEXERIL) 10 MG tablet          Today   CHIEF COMPLAINT:  Patient asymptomatic from low potassium VITAL SIGNS:  Blood pressure 127/79, pulse 62, temperature 98.2 F (36.8 C), temperature source Oral, resp. rate 16, height 5'  4" (1.626 m), weight 62.234 kg (137 lb 3.2 oz), SpO2 98 %.   REVIEW OF SYSTEMS:  Review of Systems  Constitutional: Negative for fever, chills and malaise/fatigue.  HENT: Negative for sore throat.   Eyes: Negative for blurred vision.  Respiratory: Negative for cough, hemoptysis, shortness of breath and wheezing.   Cardiovascular: Negative for chest pain, palpitations and leg swelling.  Gastrointestinal: Positive for abdominal pain and diarrhea. Negative for nausea, vomiting and blood in stool.  Genitourinary: Negative for dysuria.  Musculoskeletal: Negative  for back pain.  Neurological: Negative for dizziness, tremors and headaches.  Endo/Heme/Allergies: Does not bruise/bleed easily.   PHYSICAL EXAMINATION:  GENERAL:  54 y.o.-year-old patient lying in the bed with no acute distress.  NECK:  Supple, no jugular venous distention. No thyroid enlargement, no tenderness.  LUNGS: Normal breath sounds bilaterally, no wheezing, rales,rhonchi  No use of accessory muscles of respiration.  CARDIOVASCULAR: S1, S2 normal. No murmurs, rubs, or gallops.  ABDOMEN: Soft, mild tenderness no rebound/guarding, non-distended. Bowel sounds present. No organomegaly or mass.  EXTREMITIES: No pedal edema, cyanosis, or clubbing.  PSYCHIATRIC: The patient is alert and oriented x 3.  SKIN: No obvious rash, lesion, or ulcer.  DATA REVIEW:   CBC  Recent Labs Lab 12/24/14 1142  WBC 11.7*  HGB 14.5  HCT 44.3  PLT 399    Chemistries   Recent Labs Lab 12/24/14 1142  12/27/14 0553  NA 136  < > 140  K 2.4*  < > 3.1*  CL 111  < > 117*  CO2 17*  < > 17*  GLUCOSE 172*  < > 116*  BUN 19  < > 12  CREATININE 1.37*  < > 1.11*  CALCIUM 9.4  < > 8.9  MG  --   < > 2.0  AST 13*  --   --   ALT 9*  --   --   ALKPHOS 137*  --   --   BILITOT <0.1*  --   --   < > = values in this interval not displayed.  Cardiac Enzymes No results for input(s): TROPONINI in the last 168 hours.  Microbiology Results  @MICRORSLT48 @  RADIOLOGY:  No results found.    Management plans discussed with the patient and she is in agreement. Stable for discharge   Patient should follow up with GI in 4 weeks PCP in 1 week  CODE STATUS: Full Code  TOTAL TIME TAKING CARE OF THIS PATIENT: 55 minutes.    Note: This dictation was prepared with Dragon dictation along with smaller phrase technology. Any transcriptional errors that result from this process are unintentional.  [facility], Marissia Blackham M.D on 12/27/2014 at 9:42 AM  Between 7am to 6pm - Pager - [phone] After 6pm go to  [email or web address] - password [email or web address] [address] [facility]  [facility]  Office  [phone]  CC: Primary care physician; [phone], MD

## 2014-12-27 ENCOUNTER — Encounter: Payer: Self-pay | Admitting: Gastroenterology

## 2014-12-27 LAB — BASIC METABOLIC PANEL
ANION GAP: 6 (ref 5–15)
BUN: 12 mg/dL (ref 6–20)
CALCIUM: 8.9 mg/dL (ref 8.9–10.3)
CO2: 17 mmol/L — ABNORMAL LOW (ref 22–32)
Chloride: 117 mmol/L — ABNORMAL HIGH (ref 101–111)
Creatinine, Ser: 1.11 mg/dL — ABNORMAL HIGH (ref 0.44–1.00)
GFR calc Af Amer: >60 mL/min (ref >60)
GFR, EST NON AFRICAN AMERICAN: 54 mL/min — AB (ref >60)
GLUCOSE: 116 mg/dL — AB (ref 65–99)
POTASSIUM: 3.1 mmol/L — AB (ref 3.5–5.1)
SODIUM: 140 mmol/L (ref 135–145)

## 2014-12-27 LAB — MAGNESIUM: MAGNESIUM: 2 mg/dL (ref 1.7–2.4)

## 2014-12-27 MED ORDER — POTASSIUM CHLORIDE 20 MEQ PO PACK
40.0000 meq | PACK | Freq: Once | ORAL | Status: AC
  Administered 2014-12-27: 40 meq via ORAL
  Filled 2014-12-27: qty 2

## 2014-12-27 NOTE — Progress Notes (Signed)
Patient alert and oriented. Discharged to home. Concerns addressed. IV site removed. Discharge summary given to patient.

## 2014-12-27 NOTE — Discharge Instructions (Signed)
Diarrhea  Diarrhea is watery poop (stool). It can make you feel weak, tired, thirsty, or give you a dry mouth (signs of dehydration). Watery poop is a sign of another problem, most often an infection. It often lasts 2-3 days. It can last longer if it is a sign of something serious. Take care of yourself as told by your doctor.  HOME CARE   · Drink 1 cup (8 ounces) of fluid each time you have watery poop.  · Do not drink the following fluids:    Those that contain simple sugars (fructose, glucose, galactose, lactose, sucrose, maltose).    Sports drinks.    Fruit juices.    Whole milk products.    Sodas.    Drinks with caffeine (coffee, tea, soda) or alcohol.  · Oral rehydration solution may be used if the doctor says it is okay. You may make your own solution. Follow this recipe:    ?-? teaspoon table salt.    ¾ teaspoon baking soda.    ? teaspoon salt substitute containing potassium chloride.    1 ? tablespoons sugar.    1 liter (34 ounces) of water.  · Avoid the following foods:    High fiber foods, such as raw fruits and vegetables.    Nuts, seeds, and whole grain breads and cereals.     Those that are sweetened with sugar alcohols (xylitol, sorbitol, mannitol).  · Try eating the following foods:    Starchy foods, such as rice, toast, pasta, low-sugar cereal, oatmeal, baked potatoes, crackers, and bagels.    Bananas.    Applesauce.  · Eat probiotic-rich foods, such as yogurt and milk products that are fermented.  · Wash your hands well after each time you have watery poop.  · Only take medicine as told by your doctor.  · Take a warm bath to help lessen burning or pain from having watery poop.  GET HELP RIGHT AWAY IF:   · You cannot drink fluids without throwing up (vomiting).  · You keep throwing up.  · You have blood in your poop, or your poop looks black and tarry.  · You do not pee (urinate) in 6-8 hours, or there is only a small amount of very dark pee.  · You have belly (abdominal) pain that gets worse or  stays in the same spot (localizes).  · You are weak, dizzy, confused, or light-headed.  · You have a very bad headache.  · Your watery poop gets worse or does not get better.  · You have a fever or lasting symptoms for more than 2-3 days.  · You have a fever and your symptoms suddenly get worse.  MAKE SURE YOU:   · Understand these instructions.  · Will watch your condition.  · Will get help right away if you are not doing well or get worse.     This information is not intended to replace advice given to you by your health care provider. Make sure you discuss any questions you have with your health care provider.     Document Released: 07/23/2007 Document Revised: 02/24/2014 Document Reviewed: 10/12/2011  Elsevier Interactive Patient Education ©2016 Elsevier Inc.

## 2014-12-28 LAB — SURGICAL PATHOLOGY

## 2014-12-29 ENCOUNTER — Telehealth: Payer: Self-pay

## 2014-12-29 NOTE — Telephone Encounter (Signed)
Tried contacting pt. Home number didn't have vm to leave message. Cell number stated it could not accept calls.

## 2014-12-29 NOTE — Telephone Encounter (Signed)
Tried contacting pt again this afternoon. Still no answer at home number or cell.

## 2014-12-29 NOTE — Telephone Encounter (Signed)
-----   Message from Midge Minium, MD sent at 12/28/2014  2:47 PM EST ----- Let the patient know that the biopsies of the colon showed inflammation. Start the patient on budesonide 9 mg per day for 8 weeks and see if she gets better. If not then have her follow-up in the office.

## 2015-01-05 ENCOUNTER — Other Ambulatory Visit: Payer: Self-pay

## 2015-01-05 DIAGNOSIS — K5281 Eosinophilic gastritis or gastroenteritis: Secondary | ICD-10-CM

## 2015-01-05 MED ORDER — BUDESONIDE 3 MG PO CPEP: 9.0000 mg | ORAL_CAPSULE | ORAL | Status: DC

## 2015-01-05 NOTE — Telephone Encounter (Signed)
Tried a final time to contact pt. Still no answer on home phone and cell number stated it could not accept call. Emergency contact is the same as home. Mailed letter requesting pt to contact our office to discuss.

## 2015-02-02 ENCOUNTER — Other Ambulatory Visit
Admission: RE | Admit: 2015-02-02 | Discharge: 2015-02-02 | Disposition: A | Discharge status: Home / Self Care | Attending: Family Medicine | Admitting: Family Medicine

## 2015-02-02 NOTE — ED Notes (Signed)
Blood drawn per protocol with betadine for police.  Blood given to officer walker.

## 2015-02-07 ENCOUNTER — Telehealth: Payer: Self-pay

## 2015-02-07 ENCOUNTER — Encounter: Payer: 59 | Admitting: Gastroenterology

## 2015-02-07 NOTE — Telephone Encounter (Signed)
Patient was a NO SHOW for her appointment today. Spoke with Dr. Servando Snare regarding patient as patient was positive for Colitis. Encouraged patient to continue Budesonide 9mg  Daily. Patient states that this medication has been helping her tremendously. Explained to patient that unless she is having symptoms, she needs to come back in for 1 year check-up.

## 2015-02-22 DIAGNOSIS — F419 Anxiety disorder, unspecified: Secondary | ICD-10-CM | POA: Diagnosis not present

## 2015-02-22 DIAGNOSIS — F329 Major depressive disorder, single episode, unspecified: Secondary | ICD-10-CM | POA: Diagnosis not present

## 2015-02-22 DIAGNOSIS — F1120 Opioid dependence, uncomplicated: Secondary | ICD-10-CM | POA: Diagnosis not present

## 2015-02-27 DIAGNOSIS — F1120 Opioid dependence, uncomplicated: Secondary | ICD-10-CM | POA: Diagnosis not present

## 2015-03-01 DIAGNOSIS — F1120 Opioid dependence, uncomplicated: Secondary | ICD-10-CM | POA: Diagnosis not present

## 2015-03-01 DIAGNOSIS — F329 Major depressive disorder, single episode, unspecified: Secondary | ICD-10-CM | POA: Diagnosis not present

## 2015-03-01 DIAGNOSIS — F419 Anxiety disorder, unspecified: Secondary | ICD-10-CM | POA: Diagnosis not present

## 2015-03-06 DIAGNOSIS — F1120 Opioid dependence, uncomplicated: Secondary | ICD-10-CM | POA: Diagnosis not present

## 2015-03-08 DIAGNOSIS — F1120 Opioid dependence, uncomplicated: Secondary | ICD-10-CM | POA: Diagnosis not present

## 2015-03-08 DIAGNOSIS — F329 Major depressive disorder, single episode, unspecified: Secondary | ICD-10-CM | POA: Diagnosis not present

## 2015-03-20 DIAGNOSIS — F329 Major depressive disorder, single episode, unspecified: Secondary | ICD-10-CM | POA: Diagnosis not present

## 2015-03-20 DIAGNOSIS — F1120 Opioid dependence, uncomplicated: Secondary | ICD-10-CM | POA: Diagnosis not present

## 2015-03-22 DIAGNOSIS — F1120 Opioid dependence, uncomplicated: Secondary | ICD-10-CM | POA: Diagnosis not present

## 2015-03-27 DIAGNOSIS — F1120 Opioid dependence, uncomplicated: Secondary | ICD-10-CM | POA: Diagnosis not present

## 2015-03-29 DIAGNOSIS — F1120 Opioid dependence, uncomplicated: Secondary | ICD-10-CM | POA: Diagnosis not present

## 2015-04-03 DIAGNOSIS — F1120 Opioid dependence, uncomplicated: Secondary | ICD-10-CM | POA: Diagnosis not present

## 2015-04-03 DIAGNOSIS — F329 Major depressive disorder, single episode, unspecified: Secondary | ICD-10-CM | POA: Diagnosis not present

## 2015-04-05 DIAGNOSIS — F1120 Opioid dependence, uncomplicated: Secondary | ICD-10-CM | POA: Diagnosis not present

## 2015-04-10 DIAGNOSIS — E785 Hyperlipidemia, unspecified: Secondary | ICD-10-CM | POA: Diagnosis not present

## 2015-04-10 DIAGNOSIS — E876 Hypokalemia: Secondary | ICD-10-CM | POA: Diagnosis not present

## 2015-04-10 DIAGNOSIS — K529 Noninfective gastroenteritis and colitis, unspecified: Secondary | ICD-10-CM | POA: Diagnosis not present

## 2015-04-10 DIAGNOSIS — F329 Major depressive disorder, single episode, unspecified: Secondary | ICD-10-CM | POA: Diagnosis not present

## 2015-04-10 DIAGNOSIS — F1120 Opioid dependence, uncomplicated: Secondary | ICD-10-CM | POA: Diagnosis not present

## 2015-04-12 DIAGNOSIS — F1120 Opioid dependence, uncomplicated: Secondary | ICD-10-CM | POA: Diagnosis not present

## 2015-04-17 DIAGNOSIS — F329 Major depressive disorder, single episode, unspecified: Secondary | ICD-10-CM | POA: Diagnosis not present

## 2015-04-19 DIAGNOSIS — F329 Major depressive disorder, single episode, unspecified: Secondary | ICD-10-CM | POA: Diagnosis not present

## 2015-05-01 DIAGNOSIS — F1120 Opioid dependence, uncomplicated: Secondary | ICD-10-CM | POA: Diagnosis not present

## 2015-05-03 DIAGNOSIS — F1120 Opioid dependence, uncomplicated: Secondary | ICD-10-CM | POA: Diagnosis not present

## 2015-05-08 DIAGNOSIS — F329 Major depressive disorder, single episode, unspecified: Secondary | ICD-10-CM | POA: Diagnosis not present

## 2015-05-08 DIAGNOSIS — F1120 Opioid dependence, uncomplicated: Secondary | ICD-10-CM | POA: Diagnosis not present

## 2015-05-10 DIAGNOSIS — F1120 Opioid dependence, uncomplicated: Secondary | ICD-10-CM | POA: Diagnosis not present

## 2015-05-22 DIAGNOSIS — F1120 Opioid dependence, uncomplicated: Secondary | ICD-10-CM | POA: Diagnosis not present

## 2015-05-24 DIAGNOSIS — F1120 Opioid dependence, uncomplicated: Secondary | ICD-10-CM | POA: Diagnosis not present

## 2015-06-04 DIAGNOSIS — N390 Urinary tract infection, site not specified: Secondary | ICD-10-CM | POA: Diagnosis not present

## 2015-06-08 ENCOUNTER — Emergency Department: Payer: 59

## 2015-06-08 ENCOUNTER — Emergency Department
Admission: EM | Admit: 2015-06-08 | Discharge: 2015-06-09 | Disposition: A | Discharge status: Home / Self Care | Payer: 59 | Attending: Emergency Medicine | Admitting: Emergency Medicine

## 2015-06-08 DIAGNOSIS — Z87891 Personal history of nicotine dependence: Secondary | ICD-10-CM | POA: Diagnosis not present

## 2015-06-08 DIAGNOSIS — I509 Heart failure, unspecified: Secondary | ICD-10-CM | POA: Insufficient documentation

## 2015-06-08 DIAGNOSIS — R109 Unspecified abdominal pain: Secondary | ICD-10-CM

## 2015-06-08 DIAGNOSIS — R4781 Slurred speech: Secondary | ICD-10-CM | POA: Diagnosis not present

## 2015-06-08 DIAGNOSIS — I110 Hypertensive heart disease with heart failure: Secondary | ICD-10-CM | POA: Insufficient documentation

## 2015-06-08 DIAGNOSIS — R0602 Shortness of breath: Secondary | ICD-10-CM | POA: Diagnosis not present

## 2015-06-08 DIAGNOSIS — N390 Urinary tract infection, site not specified: Secondary | ICD-10-CM | POA: Diagnosis not present

## 2015-06-08 DIAGNOSIS — Z79899 Other long term (current) drug therapy: Secondary | ICD-10-CM | POA: Diagnosis not present

## 2015-06-08 DIAGNOSIS — J449 Chronic obstructive pulmonary disease, unspecified: Secondary | ICD-10-CM | POA: Insufficient documentation

## 2015-06-08 DIAGNOSIS — R1013 Epigastric pain: Secondary | ICD-10-CM | POA: Insufficient documentation

## 2015-06-08 DIAGNOSIS — F329 Major depressive disorder, single episode, unspecified: Secondary | ICD-10-CM | POA: Diagnosis not present

## 2015-06-08 LAB — COMPREHENSIVE METABOLIC PANEL
ALT: 10 U/L — AB (ref 14–54)
AST: 14 U/L — AB (ref 15–41)
Albumin: 4.3 g/dL (ref 3.5–5.0)
Alkaline Phosphatase: 121 U/L (ref 38–126)
Anion gap: 12 (ref 5–15)
BUN: 18 mg/dL (ref 6–20)
CHLORIDE: 101 mmol/L (ref 101–111)
CO2: 22 mmol/L (ref 22–32)
Calcium: 9.6 mg/dL (ref 8.9–10.3)
Creatinine, Ser: 1.51 mg/dL — ABNORMAL HIGH (ref 0.44–1.00)
GFR calc Af Amer: 43 mL/min — ABNORMAL LOW (ref >60)
GFR, EST NON AFRICAN AMERICAN: 37 mL/min — AB (ref >60)
Glucose, Bld: 118 mg/dL — ABNORMAL HIGH (ref 65–99)
POTASSIUM: 3.4 mmol/L — AB (ref 3.5–5.1)
SODIUM: 135 mmol/L (ref 135–145)
Total Bilirubin: 0.3 mg/dL (ref 0.3–1.2)
Total Protein: 7.7 g/dL (ref 6.5–8.1)

## 2015-06-08 LAB — TROPONIN I: Troponin I: <0.03 ng/mL (ref <0.031)

## 2015-06-08 LAB — CBC
HEMATOCRIT: 41.4 % (ref 35.0–47.0)
Hemoglobin: 13.3 g/dL (ref 12.0–16.0)
MCH: 29.1 pg (ref 26.0–34.0)
MCHC: 32.1 g/dL (ref 32.0–36.0)
MCV: 90.6 fL (ref 80.0–100.0)
Platelets: 394 10*3/uL (ref 150–440)
RBC: 4.57 MIL/uL (ref 3.80–5.20)
RDW: 14.6 % — ABNORMAL HIGH (ref 11.5–14.5)
WBC: 11.3 10*3/uL — AB (ref 3.6–11.0)

## 2015-06-08 LAB — URINALYSIS COMPLETE WITH MICROSCOPIC (ARMC ONLY)
BILIRUBIN URINE: NEGATIVE
GLUCOSE, UA: NEGATIVE mg/dL
Hgb urine dipstick: NEGATIVE
KETONES UR: NEGATIVE mg/dL
Nitrite: POSITIVE — AB
Protein, ur: 30 mg/dL — AB
Specific Gravity, Urine: 1.028 (ref 1.005–1.030)
pH: 5 (ref 5.0–8.0)

## 2015-06-08 LAB — LIPASE, BLOOD: LIPASE: 19 U/L (ref 11–51)

## 2015-06-08 MED ORDER — CEPHALEXIN 500 MG PO CAPS: 500.0000 mg | ORAL_CAPSULE | Freq: Three times a day (TID) | ORAL | Status: DC

## 2015-06-08 MED ORDER — METOCLOPRAMIDE HCL 5 MG/ML IJ SOLN
10.0000 mg | Freq: Once | INTRAMUSCULAR | Status: AC
  Administered 2015-06-08: 10 mg via INTRAVENOUS
  Filled 2015-06-08: qty 2

## 2015-06-08 MED ORDER — SODIUM CHLORIDE 0.9 % IV BOLUS (SEPSIS)
1000.0000 mL | Freq: Once | INTRAVENOUS | Status: AC
  Administered 2015-06-08: 1000 mL via INTRAVENOUS

## 2015-06-08 MED ORDER — CEFTRIAXONE SODIUM 1 G IJ SOLR
1.0000 g | Freq: Once | INTRAMUSCULAR | Status: AC
  Administered 2015-06-08: 1 g via INTRAVENOUS
  Filled 2015-06-08: qty 10

## 2015-06-08 MED ORDER — ONDANSETRON 4 MG PO TBDP
4.0000 mg | ORAL_TABLET | Freq: Once | ORAL | Status: AC
  Administered 2015-06-08: 4 mg via ORAL
  Filled 2015-06-08: qty 1

## 2015-06-08 MED ORDER — PROMETHAZINE HCL 25 MG RE SUPP: 25.0000 mg | Freq: Four times a day (QID) | RECTAL | Status: DC | PRN

## 2015-06-08 MED ORDER — DICYCLOMINE HCL 10 MG PO CAPS
10.0000 mg | ORAL_CAPSULE | Freq: Once | ORAL | Status: AC
  Administered 2015-06-08: 10 mg via ORAL
  Filled 2015-06-08: qty 1

## 2015-06-08 MED ORDER — PROCHLORPERAZINE EDISYLATE 5 MG/ML IJ SOLN
10.0000 mg | Freq: Once | INTRAMUSCULAR | Status: AC
  Administered 2015-06-08: 10 mg via INTRAVENOUS
  Filled 2015-06-08: qty 2

## 2015-06-08 NOTE — ED Notes (Signed)
Pt stating she is still nauseated. Pt asking for phenergan, xanax, or trazadone for nausea. Pt advised that the xanax and trazadone are not prescribed for nausea. Derrill Kay, MD notified of pt's request.

## 2015-06-08 NOTE — Discharge Instructions (Signed)
Please seek medical attention for any high fevers, chest pain, shortness of breath, change in behavior, persistent vomiting, bloody stool or any other new or concerning symptoms. ° ° °Urinary Tract Infection °A urinary tract infection (UTI) can occur any place along the urinary tract. The tract includes the kidneys, ureters, bladder, and urethra. A type of germ called bacteria often causes a UTI. UTIs are often helped with antibiotic medicine.  °HOME CARE  °· If given, take antibiotics as told by your doctor. Finish them even if you start to feel better. °· Drink enough fluids to keep your pee (urine) clear or pale yellow. °· Avoid tea, drinks with caffeine, and bubbly (carbonated) drinks. °· Pee often. Avoid holding your pee in for a long time. °· Pee before and after having sex (intercourse). °· Wipe from front to back after you poop (bowel movement) if you are a woman. Use each tissue only once. °GET HELP RIGHT AWAY IF:  °· You have back pain. °· You have lower belly (abdominal) pain. °· You have chills. °· You feel sick to your stomach (nauseous). °· You throw up (vomit). °· Your burning or discomfort with peeing does not go away. °· You have a fever. °· Your symptoms are not better in 3 days. °MAKE SURE YOU:  °· Understand these instructions. °· Will watch your condition. °· Will get help right away if you are not doing well or get worse. °  °This information is not intended to replace advice given to you by your health care provider. Make sure you discuss any questions you have with your health care provider. °  °Document Released: 07/23/2007 Document Revised: 02/24/2014 Document Reviewed: 09/04/2011 °Elsevier Interactive Patient Education ©2016 Elsevier Inc. ° °

## 2015-06-08 NOTE — ED Provider Notes (Signed)
[facility] Emergency Department Provider Note   ____________________________________________  Time seen: ~2025  I have reviewed the triage vital signs and the nursing notes.   HISTORY  Chief Complaint Abdominal Pain; Shortness of Breath; and Aphasia   History limited by: Not Limited   HPI Whitney Bernard is a 55 y.o. female who presents to the emergency department with multiple medical complaints. She states that she has had some abdominal pain recently. She describes it as being located in the epigastric region. She states it has been associated with shortness of breath. It is also been associated with some nausea however she has not had any vomiting. She denies any fevers. She states that she additionally has had intermittent slurred speech. She thinks that this started this morning. She states her husband did notice something different however he was not at bedside secondary to confirm or deny. The patient denies any headache with this. Denies similar symptoms in the past.    Past Medical History  Diagnosis Date  . Hypertension   . Hypercholesteremia   . RA (rheumatoid arthritis) (HCC)   . Depression   . COPD (chronic obstructive pulmonary disease) (HCC)   . Headache(784.0)     migraines  . Hypokalemia   . CHF (congestive heart failure) (HCC)   . Migraine   . Chronic pain syndrome   . Hyperthyroidism   . Acute pancreatitis   . Narcotic abuse     Patient Active Problem List   Diagnosis Date Noted  . Protein-calorie malnutrition, severe 12/26/2014  . Sedative abuse 12/25/2014  . Loose stools   . Diarrhea   . Hypokalemia 12/24/2014  . BP (high blood pressure) 02/23/2013  . Misuse of prescription only drugs 02/23/2013  . Anxiety 06/22/2012  . Colitis 05/11/2012  . Clinical depression 05/11/2012  . Hypercholesterolemia 05/11/2012  . Gastroduodenal ulcer 03/23/2012    Past Surgical History  Procedure Laterality Date  . Fracture surgery     . Cholecystectomy    . Cervical polypectomy    . Tubal ligation    . Neck surgery    . Wisdom tooth extraction    . Anterior cervical decomp/discectomy fusion N/A 11/26/2012    Procedure: ANTERIOR CERVICAL DECOMPRESSION/DISCECTOMY FUSION 2 LEVELS;  Surgeon: Reinaldo Meeker, MD;  Location: [facility] NEURO ORS;  Service: Neurosurgery;  Laterality: N/A;  ANTERIOR CERVICAL DECOMPRESSION/DISCECTOMY FUSION 2 LEVELS  . Esophagogastroduodenoscopy N/A 12/26/2014    Procedure: ESOPHAGOGASTRODUODENOSCOPY (EGD);  Surgeon: Midge Minium, MD;  Location: [facility] ENDOSCOPY;  Service: Endoscopy;  Laterality: N/A;    Current Outpatient Rx  Name  Route  Sig  Dispense  Refill  . ALPRAZolam (XANAX) 0.5 MG tablet   Oral   Take 0.5 mg by mouth 2 (two) times daily. Panic attacks         . budesonide (ENTOCORT EC) 3 MG 24 hr capsule   Oral   Take 3 capsules (9 mg total) by mouth every morning.   180 capsule   0   . busPIRone (BUSPAR) 5 MG tablet   Oral   Take 5 mg by mouth 2 (two) times daily.      1   . butalbital-acetaminophen-caffeine (FIORICET, ESGIC) 50-325-40 MG per tablet   Oral   Take 1 tablet by mouth as needed. Headaches         . carisoprodol (SOMA) 350 MG tablet   Oral   Take 350 mg by mouth 2 (two) times daily as needed. For muscle relaxation.  1   . diphenoxylate-atropine (LOMOTIL) 2.5-0.025 MG tablet   Oral   Take 1 tablet by mouth 3 (three) times daily.      0   . furosemide (LASIX) 40 MG tablet   Oral   Take 40 mg by mouth daily.         Marland Kitchen PARoxetine (PAXIL) 40 MG tablet   Oral   Take 40 mg by mouth daily.         . potassium chloride SA (K-DUR,KLOR-CON) 20 MEQ tablet   Oral   Take 20 mEq by mouth 3 (three) times daily.          . SUBOXONE 8-2 MG FILM   Oral   Take 1 Film by mouth 2 (two) times daily.      0     Dispense as written.     Allergies Almond oil  Family History  Problem Relation Age of Onset  . CAD Father   . Alzheimer's disease       Social History Social History  Substance Use Topics  . Smoking status: Former Smoker -- 0.50 packs/day for 38 years    Types: Cigarettes    Quit date: 11/14/2012  . Smokeless tobacco: Never Used  . Alcohol Use: Yes     Comment: occ    Review of Systems  Constitutional: Negative for fever. Cardiovascular: Negative for chest pain. Respiratory: Positive for shortness of breath. Gastrointestinal: Positive for abdominal pain, nausea. Neurological: Negative for headaches, focal weakness or numbness.  10-point ROS otherwise negative.  ____________________________________________   PHYSICAL EXAM:  VITAL SIGNS: ED Triage Vitals  Enc Vitals Group     BP 06/08/15 1953 136/73 mmHg     Pulse Rate 06/08/15 1953 89     Resp 06/08/15 1953 18     Temp 06/08/15 1953 98.9 F (37.2 C)     Temp Source 06/08/15 1953 Oral     SpO2 06/08/15 1953 95 %     Weight 06/08/15 1953 135 lb (61.236 kg)     Height 06/08/15 1953 5\' 4"  (1.626 m)     Head Cir --      Peak Flow --      Pain Score 06/08/15 1955 5   Constitutional: Alert and oriented. Well appearing and in no distress. Eyes: Conjunctivae are normal. PERRL. Normal extraocular movements. ENT   Head: Normocephalic and atraumatic.   Nose: No congestion/rhinnorhea.   Mouth/Throat: Mucous membranes are moist.   Neck: No stridor. Hematological/Lymphatic/Immunilogical: No cervical lymphadenopathy. Cardiovascular: Normal rate, regular rhythm.  No murmurs, rubs, or gallops. Respiratory: Normal respiratory effort without tachypnea nor retractions. Breath sounds are clear and equal bilaterally. No wheezes/rales/rhonchi. Gastrointestinal: Soft and Mildly tender to palpation somewhat diffusely. Genitourinary: Deferred Musculoskeletal: Normal range of motion in all extremities. No joint effusions.  No lower extremity tenderness nor edema. Neurologic:  Normal speech and language. No gross focal neurologic deficits are appreciated.   Skin:  Skin is warm, dry and intact. No rash noted. Psychiatric: Mood and affect are normal. Speech and behavior are normal. Patient exhibits appropriate insight and judgment.  ____________________________________________    LABS (pertinent positives/negatives)  Labs Reviewed  COMPREHENSIVE METABOLIC PANEL - Abnormal; Notable for the following:    Potassium 3.4 (*)    Glucose, Bld 118 (*)    Creatinine, Ser 1.51 (*)    AST 14 (*)    ALT 10 (*)    GFR calc non Af Amer 37 (*)    GFR calc Af 06/10/15  43 (*)    All other components within normal limits  CBC - Abnormal; Notable for the following:    WBC 11.3 (*)    RDW 14.6 (*)    All other components within normal limits  URINALYSIS COMPLETEWITH MICROSCOPIC ([facility] ONLY) - Abnormal; Notable for the following:    Color, Urine AMBER (*)    APPearance CLEAR (*)    Protein, ur 30 (*)    Nitrite POSITIVE (*)    Leukocytes, UA 1+ (*)    Bacteria, UA FEW (*)    Squamous Epithelial / LPF 6-30 (*)    All other components within normal limits  LIPASE, BLOOD  TROPONIN I     ____________________________________________   EKG  I, Phineas Semen, attending physician, personally viewed and interpreted this EKG  EKG Time: 1950 Rate: 90 Rhythm: normal sinus rhythm Axis: normal Intervals: qtc 455 QRS: narrow, q waves III ST changes: no st elevation Impression: abnormal ekg   ____________________________________________    RADIOLOGY  CXR  IMPRESSION: No active cardiopulmonary disease.  CT head  IMPRESSION: No acute intracranial abnormality.  ____________________________________________   PROCEDURES  Procedure(s) performed: None  Critical Care performed: No  ____________________________________________   INITIAL IMPRESSION / ASSESSMENT AND PLAN / ED COURSE  Pertinent labs & imaging results that were available during my care of the patient were reviewed by me and considered in my medical decision making (see  chart for details).  Patient presented to the emergency department today with multiple medical complaints. On exam the patient did have some abdominal tenderness. Blood work and urine consistent with a urinary tract infection. Will give patient does have IV antibiotics here. Will discharge with IV antibiotics.  ____________________________________________   FINAL CLINICAL IMPRESSION(S) / ED DIAGNOSES  Final diagnoses:  UTI (lower urinary tract infection)  Abdominal pain, unspecified abdominal location     Phineas Semen, MD 06/08/15 2325

## 2015-06-08 NOTE — ED Notes (Signed)
Pt in via triage with complaints of sob x "the last couple of days."  Pt also reports abdominal pain, nausea, denies vomiting.  Pt states, "I felt like I was going to die if I didn't come in."  Pt reports intermittent slurred speech since this morning.  Pt A/Ox4, vitals WDL, no immediate distress at this time.  MD at bedside.

## 2015-06-08 NOTE — ED Notes (Signed)
Pt ambulatory to triage with no difficulty. Pt reports started on Wednesday with abd pain that is midline above her umbilicus. Pt reports developed nausea but no vomiting. Pt also reports intermittent feeling of shortness of breath over " the last several days". Around lunch time today pt reports she developed slurred speech. Pt speech is clear at this time. Pt is talking in full and complete sentences with resp even and unlabored.

## 2015-06-13 DIAGNOSIS — F1120 Opioid dependence, uncomplicated: Secondary | ICD-10-CM | POA: Diagnosis not present

## 2015-06-21 DIAGNOSIS — F1120 Opioid dependence, uncomplicated: Secondary | ICD-10-CM | POA: Diagnosis not present

## 2015-07-14 DIAGNOSIS — F1120 Opioid dependence, uncomplicated: Secondary | ICD-10-CM | POA: Diagnosis not present

## 2015-07-28 DIAGNOSIS — F1120 Opioid dependence, uncomplicated: Secondary | ICD-10-CM | POA: Diagnosis not present

## 2015-09-15 IMAGING — RF DG CERVICAL SPINE 1V
1 series · 1 of 1 positions shown · non-contrast
Comparison: None available

CLINICAL DATA: Cervical fusion

EXAM:
DG CERVICAL SPINE - 1 VIEW; DG C-ARM 1-60 MIN

[Series 1: run · 1 of 1 slices shown]
[im 1/1]
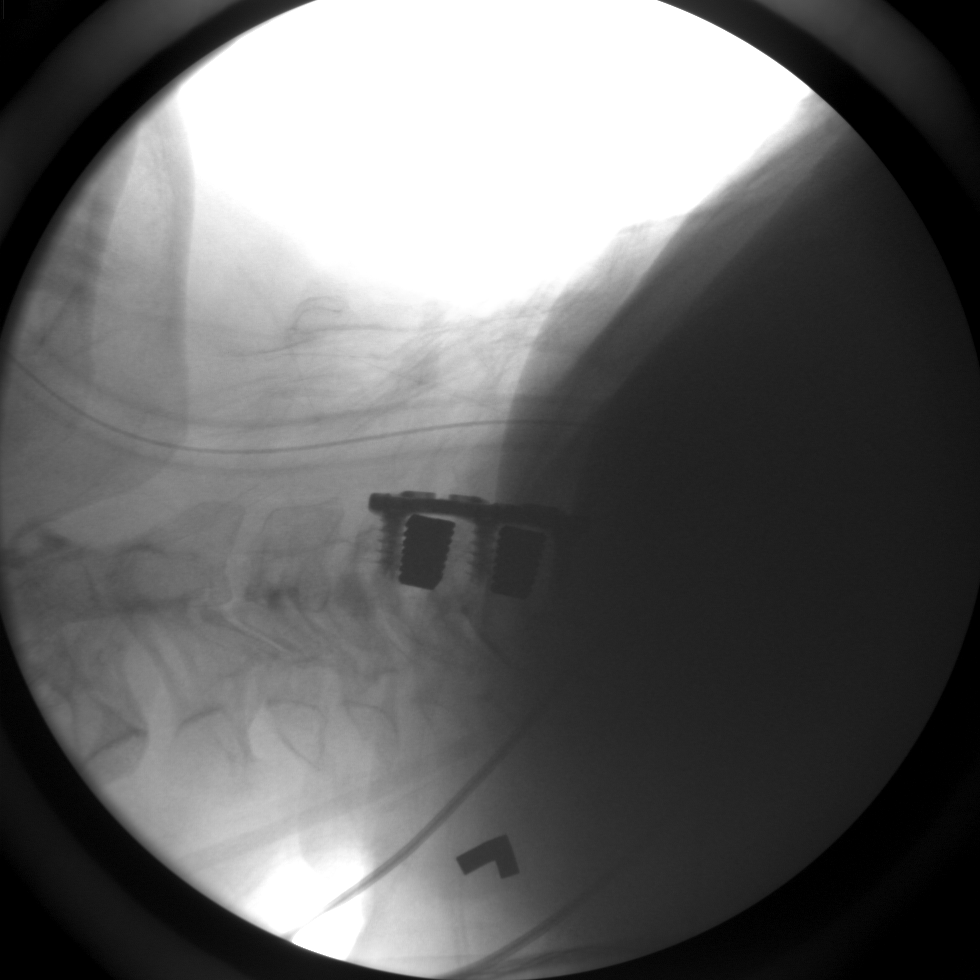

[1 of 1 positions shown; findings below may reference images not displayed]

FINDINGS: A single lateral intraoperative cervical fluoroscopic spot image
documents anterior fixation hardware C4-C6. Graft cages project in
the C4-5 and C5-6 interspaces. The cervicothoracic junction is not
well seen.
IMPRESSION: ACDF C4-5, C5-6.

## 2015-10-11 DIAGNOSIS — N390 Urinary tract infection, site not specified: Secondary | ICD-10-CM | POA: Diagnosis not present

## 2015-10-11 DIAGNOSIS — E785 Hyperlipidemia, unspecified: Secondary | ICD-10-CM | POA: Diagnosis not present

## 2015-10-11 DIAGNOSIS — R109 Unspecified abdominal pain: Secondary | ICD-10-CM | POA: Diagnosis not present

## 2015-10-23 DIAGNOSIS — E785 Hyperlipidemia, unspecified: Secondary | ICD-10-CM | POA: Diagnosis not present

## 2015-10-23 DIAGNOSIS — K529 Noninfective gastroenteritis and colitis, unspecified: Secondary | ICD-10-CM | POA: Diagnosis not present

## 2015-10-23 DIAGNOSIS — R109 Unspecified abdominal pain: Secondary | ICD-10-CM | POA: Diagnosis not present

## 2015-10-24 ENCOUNTER — Emergency Department
Admission: EM | Admit: 2015-10-24 | Discharge: 2015-10-24 | Disposition: A | Discharge status: Home / Self Care | Payer: 59 | Attending: Emergency Medicine | Admitting: Emergency Medicine

## 2015-10-24 ENCOUNTER — Encounter: Payer: Self-pay | Admitting: *Deleted

## 2015-10-24 DIAGNOSIS — Z87891 Personal history of nicotine dependence: Secondary | ICD-10-CM | POA: Insufficient documentation

## 2015-10-24 DIAGNOSIS — I110 Hypertensive heart disease with heart failure: Secondary | ICD-10-CM | POA: Diagnosis not present

## 2015-10-24 DIAGNOSIS — R112 Nausea with vomiting, unspecified: Secondary | ICD-10-CM | POA: Diagnosis not present

## 2015-10-24 DIAGNOSIS — R109 Unspecified abdominal pain: Secondary | ICD-10-CM | POA: Diagnosis not present

## 2015-10-24 DIAGNOSIS — J449 Chronic obstructive pulmonary disease, unspecified: Secondary | ICD-10-CM | POA: Diagnosis not present

## 2015-10-24 DIAGNOSIS — I509 Heart failure, unspecified: Secondary | ICD-10-CM | POA: Insufficient documentation

## 2015-10-24 DIAGNOSIS — Z5321 Procedure and treatment not carried out due to patient leaving prior to being seen by health care provider: Secondary | ICD-10-CM | POA: Diagnosis not present

## 2015-10-24 LAB — CBC
HEMATOCRIT: 40.8 % (ref 35.0–47.0)
HEMOGLOBIN: 13.7 g/dL (ref 12.0–16.0)
MCH: 28.8 pg (ref 26.0–34.0)
MCHC: 33.6 g/dL (ref 32.0–36.0)
MCV: 85.8 fL (ref 80.0–100.0)
Platelets: 483 10*3/uL — ABNORMAL HIGH (ref 150–440)
RBC: 4.75 MIL/uL (ref 3.80–5.20)
RDW: 17.2 % — ABNORMAL HIGH (ref 11.5–14.5)
WBC: 13.3 10*3/uL — ABNORMAL HIGH (ref 3.6–11.0)

## 2015-10-24 LAB — URINALYSIS COMPLETE WITH MICROSCOPIC (ARMC ONLY)
Bacteria, UA: NONE SEEN
Bilirubin Urine: NEGATIVE
Glucose, UA: NEGATIVE mg/dL
Hgb urine dipstick: NEGATIVE
Ketones, ur: NEGATIVE mg/dL
Nitrite: NEGATIVE
PROTEIN: 30 mg/dL — AB
SPECIFIC GRAVITY, URINE: 1.017 (ref 1.005–1.030)
pH: 5 (ref 5.0–8.0)

## 2015-10-24 LAB — COMPREHENSIVE METABOLIC PANEL
ALT: 17 U/L (ref 14–54)
ANION GAP: 12 (ref 5–15)
AST: 14 U/L — ABNORMAL LOW (ref 15–41)
Albumin: 3.8 g/dL (ref 3.5–5.0)
Alkaline Phosphatase: 94 U/L (ref 38–126)
BUN: 20 mg/dL (ref 6–20)
CHLORIDE: 101 mmol/L (ref 101–111)
CO2: 22 mmol/L (ref 22–32)
Calcium: 9.5 mg/dL (ref 8.9–10.3)
Creatinine, Ser: 1.36 mg/dL — ABNORMAL HIGH (ref 0.44–1.00)
GFR calc non Af Amer: 42 mL/min — ABNORMAL LOW (ref >60)
GFR, EST AFRICAN AMERICAN: 49 mL/min — AB (ref >60)
Glucose, Bld: 112 mg/dL — ABNORMAL HIGH (ref 65–99)
Potassium: 2.8 mmol/L — ABNORMAL LOW (ref 3.5–5.1)
SODIUM: 135 mmol/L (ref 135–145)
Total Bilirubin: 0.2 mg/dL — ABNORMAL LOW (ref 0.3–1.2)
Total Protein: 7.8 g/dL (ref 6.5–8.1)

## 2015-10-24 LAB — LIPASE, BLOOD: LIPASE: 28 U/L (ref 11–51)

## 2015-10-24 MED ORDER — ONDANSETRON 4 MG PO TBDP
4.0000 mg | ORAL_TABLET | Freq: Once | ORAL | Status: AC | PRN
  Administered 2015-10-24: 4 mg via ORAL

## 2015-10-24 MED ORDER — ONDANSETRON 4 MG PO TBDP
ORAL_TABLET | ORAL | Status: AC
  Filled 2015-10-24: qty 1

## 2015-10-24 NOTE — ED Triage Notes (Signed)
Pt complains of abdominal pain with nausea/vomiting for 2 days, pt reports possible? Low grade fever

## 2015-10-25 ENCOUNTER — Telehealth: Payer: Self-pay | Admitting: Emergency Medicine

## 2015-10-25 NOTE — Telephone Encounter (Signed)
Called patient due to lwot to inquire about condition and follow up plans.  I told her that the lab tests we did were back and that she has low potassium and elev wbc.  She agrees to call pcp now and inform him to look at labs andthat she has epigastric pain continuing.

## 2015-11-07 DIAGNOSIS — K529 Noninfective gastroenteritis and colitis, unspecified: Secondary | ICD-10-CM | POA: Diagnosis not present

## 2015-11-07 DIAGNOSIS — E785 Hyperlipidemia, unspecified: Secondary | ICD-10-CM | POA: Diagnosis not present

## 2015-11-07 DIAGNOSIS — E876 Hypokalemia: Secondary | ICD-10-CM | POA: Diagnosis not present

## 2015-11-20 DIAGNOSIS — Z23 Encounter for immunization: Secondary | ICD-10-CM | POA: Diagnosis not present

## 2015-12-17 DIAGNOSIS — K529 Noninfective gastroenteritis and colitis, unspecified: Secondary | ICD-10-CM | POA: Diagnosis not present

## 2015-12-17 DIAGNOSIS — F418 Other specified anxiety disorders: Secondary | ICD-10-CM | POA: Diagnosis not present

## 2015-12-17 DIAGNOSIS — E876 Hypokalemia: Secondary | ICD-10-CM | POA: Diagnosis not present

## 2015-12-17 DIAGNOSIS — E785 Hyperlipidemia, unspecified: Secondary | ICD-10-CM | POA: Diagnosis not present

## 2016-01-04 DIAGNOSIS — K529 Noninfective gastroenteritis and colitis, unspecified: Secondary | ICD-10-CM | POA: Diagnosis not present

## 2016-01-04 DIAGNOSIS — E785 Hyperlipidemia, unspecified: Secondary | ICD-10-CM | POA: Diagnosis not present

## 2016-01-04 DIAGNOSIS — E876 Hypokalemia: Secondary | ICD-10-CM | POA: Diagnosis not present

## 2016-01-04 DIAGNOSIS — M5440 Lumbago with sciatica, unspecified side: Secondary | ICD-10-CM | POA: Diagnosis not present

## 2016-02-20 ENCOUNTER — Other Ambulatory Visit: Payer: Self-pay | Admitting: Internal Medicine

## 2016-02-20 DIAGNOSIS — E785 Hyperlipidemia, unspecified: Secondary | ICD-10-CM | POA: Diagnosis not present

## 2016-02-20 DIAGNOSIS — K529 Noninfective gastroenteritis and colitis, unspecified: Secondary | ICD-10-CM | POA: Diagnosis not present

## 2016-02-20 DIAGNOSIS — E876 Hypokalemia: Secondary | ICD-10-CM | POA: Diagnosis not present

## 2016-02-20 DIAGNOSIS — M25469 Effusion, unspecified knee: Secondary | ICD-10-CM

## 2016-02-20 DIAGNOSIS — M1710 Unilateral primary osteoarthritis, unspecified knee: Secondary | ICD-10-CM | POA: Diagnosis not present

## 2016-03-03 DIAGNOSIS — E876 Hypokalemia: Secondary | ICD-10-CM | POA: Diagnosis not present

## 2016-03-03 DIAGNOSIS — K529 Noninfective gastroenteritis and colitis, unspecified: Secondary | ICD-10-CM | POA: Diagnosis not present

## 2016-03-03 DIAGNOSIS — E785 Hyperlipidemia, unspecified: Secondary | ICD-10-CM | POA: Diagnosis not present

## 2016-03-17 DIAGNOSIS — K529 Noninfective gastroenteritis and colitis, unspecified: Secondary | ICD-10-CM | POA: Diagnosis not present

## 2016-03-17 DIAGNOSIS — E876 Hypokalemia: Secondary | ICD-10-CM | POA: Diagnosis not present

## 2016-03-17 DIAGNOSIS — E785 Hyperlipidemia, unspecified: Secondary | ICD-10-CM | POA: Diagnosis not present

## 2016-06-02 DIAGNOSIS — N183 Chronic kidney disease, stage 3 unspecified: Secondary | ICD-10-CM | POA: Insufficient documentation

## 2016-06-02 DIAGNOSIS — E7800 Pure hypercholesterolemia, unspecified: Secondary | ICD-10-CM | POA: Diagnosis not present

## 2016-06-02 DIAGNOSIS — N1831 Chronic kidney disease, stage 3a: Secondary | ICD-10-CM | POA: Insufficient documentation

## 2016-06-02 DIAGNOSIS — F419 Anxiety disorder, unspecified: Secondary | ICD-10-CM | POA: Diagnosis not present

## 2016-06-02 DIAGNOSIS — F1910 Other psychoactive substance abuse, uncomplicated: Secondary | ICD-10-CM | POA: Diagnosis not present

## 2016-06-02 DIAGNOSIS — G894 Chronic pain syndrome: Secondary | ICD-10-CM | POA: Diagnosis not present

## 2016-06-10 DIAGNOSIS — K529 Noninfective gastroenteritis and colitis, unspecified: Secondary | ICD-10-CM | POA: Diagnosis not present

## 2016-06-10 DIAGNOSIS — E876 Hypokalemia: Secondary | ICD-10-CM | POA: Diagnosis not present

## 2016-06-10 DIAGNOSIS — E785 Hyperlipidemia, unspecified: Secondary | ICD-10-CM | POA: Diagnosis not present

## 2016-06-17 DIAGNOSIS — K529 Noninfective gastroenteritis and colitis, unspecified: Secondary | ICD-10-CM | POA: Diagnosis not present

## 2016-06-17 DIAGNOSIS — N390 Urinary tract infection, site not specified: Secondary | ICD-10-CM | POA: Diagnosis not present

## 2016-06-17 DIAGNOSIS — E876 Hypokalemia: Secondary | ICD-10-CM | POA: Diagnosis not present

## 2016-06-17 DIAGNOSIS — E785 Hyperlipidemia, unspecified: Secondary | ICD-10-CM | POA: Diagnosis not present

## 2016-06-30 DIAGNOSIS — Z0000 Encounter for general adult medical examination without abnormal findings: Secondary | ICD-10-CM | POA: Diagnosis not present

## 2016-07-01 DIAGNOSIS — Z1231 Encounter for screening mammogram for malignant neoplasm of breast: Secondary | ICD-10-CM | POA: Diagnosis not present

## 2016-07-01 DIAGNOSIS — G894 Chronic pain syndrome: Secondary | ICD-10-CM | POA: Diagnosis not present

## 2016-07-01 DIAGNOSIS — F419 Anxiety disorder, unspecified: Secondary | ICD-10-CM | POA: Diagnosis not present

## 2016-07-29 DIAGNOSIS — E876 Hypokalemia: Secondary | ICD-10-CM | POA: Diagnosis not present

## 2016-07-29 DIAGNOSIS — K529 Noninfective gastroenteritis and colitis, unspecified: Secondary | ICD-10-CM | POA: Diagnosis not present

## 2016-07-29 DIAGNOSIS — E785 Hyperlipidemia, unspecified: Secondary | ICD-10-CM | POA: Diagnosis not present

## 2016-08-25 DIAGNOSIS — G894 Chronic pain syndrome: Secondary | ICD-10-CM | POA: Diagnosis not present

## 2016-08-25 DIAGNOSIS — F419 Anxiety disorder, unspecified: Secondary | ICD-10-CM | POA: Diagnosis not present

## 2016-08-25 DIAGNOSIS — F1910 Other psychoactive substance abuse, uncomplicated: Secondary | ICD-10-CM | POA: Diagnosis not present

## 2016-09-22 DIAGNOSIS — F1910 Other psychoactive substance abuse, uncomplicated: Secondary | ICD-10-CM | POA: Diagnosis not present

## 2016-09-22 DIAGNOSIS — R600 Localized edema: Secondary | ICD-10-CM | POA: Diagnosis not present

## 2016-09-22 DIAGNOSIS — G894 Chronic pain syndrome: Secondary | ICD-10-CM | POA: Diagnosis not present

## 2016-09-22 DIAGNOSIS — F419 Anxiety disorder, unspecified: Secondary | ICD-10-CM | POA: Diagnosis not present

## 2016-11-11 DIAGNOSIS — G894 Chronic pain syndrome: Secondary | ICD-10-CM | POA: Diagnosis not present

## 2016-12-11 DIAGNOSIS — G894 Chronic pain syndrome: Secondary | ICD-10-CM | POA: Diagnosis not present

## 2016-12-11 DIAGNOSIS — E663 Overweight: Secondary | ICD-10-CM | POA: Diagnosis not present

## 2016-12-11 DIAGNOSIS — F419 Anxiety disorder, unspecified: Secondary | ICD-10-CM | POA: Diagnosis not present

## 2017-01-06 DIAGNOSIS — G894 Chronic pain syndrome: Secondary | ICD-10-CM | POA: Diagnosis not present

## 2017-02-03 DIAGNOSIS — G894 Chronic pain syndrome: Secondary | ICD-10-CM | POA: Diagnosis not present

## 2017-02-03 DIAGNOSIS — Z79891 Long term (current) use of opiate analgesic: Secondary | ICD-10-CM | POA: Diagnosis not present

## 2017-03-05 DIAGNOSIS — G894 Chronic pain syndrome: Secondary | ICD-10-CM | POA: Diagnosis not present

## 2017-03-05 DIAGNOSIS — F419 Anxiety disorder, unspecified: Secondary | ICD-10-CM | POA: Diagnosis not present

## 2017-04-07 DIAGNOSIS — F1111 Opioid abuse, in remission: Secondary | ICD-10-CM | POA: Diagnosis not present

## 2017-04-07 DIAGNOSIS — G894 Chronic pain syndrome: Secondary | ICD-10-CM | POA: Diagnosis not present

## 2017-05-19 DIAGNOSIS — F419 Anxiety disorder, unspecified: Secondary | ICD-10-CM | POA: Diagnosis not present

## 2017-05-19 DIAGNOSIS — G894 Chronic pain syndrome: Secondary | ICD-10-CM | POA: Diagnosis not present

## 2017-05-19 DIAGNOSIS — F1910 Other psychoactive substance abuse, uncomplicated: Secondary | ICD-10-CM | POA: Diagnosis not present

## 2017-06-30 DIAGNOSIS — F419 Anxiety disorder, unspecified: Secondary | ICD-10-CM | POA: Diagnosis not present

## 2017-06-30 DIAGNOSIS — F1910 Other psychoactive substance abuse, uncomplicated: Secondary | ICD-10-CM | POA: Diagnosis not present

## 2017-06-30 DIAGNOSIS — G894 Chronic pain syndrome: Secondary | ICD-10-CM | POA: Diagnosis not present

## 2017-08-17 DIAGNOSIS — E7800 Pure hypercholesterolemia, unspecified: Secondary | ICD-10-CM | POA: Diagnosis not present

## 2017-08-17 DIAGNOSIS — R1030 Lower abdominal pain, unspecified: Secondary | ICD-10-CM | POA: Diagnosis not present

## 2017-08-17 DIAGNOSIS — F419 Anxiety disorder, unspecified: Secondary | ICD-10-CM | POA: Diagnosis not present

## 2017-08-17 DIAGNOSIS — J449 Chronic obstructive pulmonary disease, unspecified: Secondary | ICD-10-CM | POA: Diagnosis not present

## 2017-08-17 DIAGNOSIS — E559 Vitamin D deficiency, unspecified: Secondary | ICD-10-CM | POA: Diagnosis not present

## 2017-08-17 DIAGNOSIS — E8801 Alpha-1-antitrypsin deficiency: Secondary | ICD-10-CM | POA: Diagnosis not present

## 2017-08-23 ENCOUNTER — Encounter: Payer: Self-pay | Admitting: Emergency Medicine

## 2017-08-23 ENCOUNTER — Other Ambulatory Visit: Payer: Self-pay

## 2017-08-23 ENCOUNTER — Emergency Department: Payer: 59

## 2017-08-23 ENCOUNTER — Observation Stay
Admission: EM | Admit: 2017-08-23 | Discharge: 2017-08-24 | Disposition: A | Discharge status: Home / Self Care | Payer: 59 | Attending: Internal Medicine | Admitting: Internal Medicine

## 2017-08-23 DIAGNOSIS — N183 Chronic kidney disease, stage 3 (moderate): Secondary | ICD-10-CM | POA: Diagnosis not present

## 2017-08-23 DIAGNOSIS — F419 Anxiety disorder, unspecified: Secondary | ICD-10-CM | POA: Diagnosis not present

## 2017-08-23 DIAGNOSIS — R0602 Shortness of breath: Secondary | ICD-10-CM | POA: Diagnosis not present

## 2017-08-23 DIAGNOSIS — I130 Hypertensive heart and chronic kidney disease with heart failure and stage 1 through stage 4 chronic kidney disease, or unspecified chronic kidney disease: Secondary | ICD-10-CM | POA: Diagnosis not present

## 2017-08-23 DIAGNOSIS — J449 Chronic obstructive pulmonary disease, unspecified: Secondary | ICD-10-CM | POA: Diagnosis not present

## 2017-08-23 DIAGNOSIS — Z79891 Long term (current) use of opiate analgesic: Secondary | ICD-10-CM | POA: Diagnosis not present

## 2017-08-23 DIAGNOSIS — Z87891 Personal history of nicotine dependence: Secondary | ICD-10-CM | POA: Insufficient documentation

## 2017-08-23 DIAGNOSIS — R079 Chest pain, unspecified: Secondary | ICD-10-CM

## 2017-08-23 DIAGNOSIS — Z79899 Other long term (current) drug therapy: Secondary | ICD-10-CM | POA: Insufficient documentation

## 2017-08-23 DIAGNOSIS — M25512 Pain in left shoulder: Secondary | ICD-10-CM | POA: Diagnosis not present

## 2017-08-23 DIAGNOSIS — R072 Precordial pain: Secondary | ICD-10-CM | POA: Diagnosis not present

## 2017-08-23 DIAGNOSIS — I509 Heart failure, unspecified: Secondary | ICD-10-CM | POA: Insufficient documentation

## 2017-08-23 DIAGNOSIS — G894 Chronic pain syndrome: Secondary | ICD-10-CM | POA: Insufficient documentation

## 2017-08-23 DIAGNOSIS — Z8249 Family history of ischemic heart disease and other diseases of the circulatory system: Secondary | ICD-10-CM | POA: Diagnosis not present

## 2017-08-23 DIAGNOSIS — R42 Dizziness and giddiness: Secondary | ICD-10-CM | POA: Diagnosis not present

## 2017-08-23 DIAGNOSIS — E1165 Type 2 diabetes mellitus with hyperglycemia: Secondary | ICD-10-CM | POA: Diagnosis not present

## 2017-08-23 DIAGNOSIS — R110 Nausea: Secondary | ICD-10-CM | POA: Diagnosis not present

## 2017-08-23 DIAGNOSIS — R0789 Other chest pain: Principal | ICD-10-CM | POA: Diagnosis present

## 2017-08-23 DIAGNOSIS — E1122 Type 2 diabetes mellitus with diabetic chronic kidney disease: Secondary | ICD-10-CM | POA: Diagnosis not present

## 2017-08-23 HISTORY — DX: Type 2 diabetes mellitus without complications: E11.9

## 2017-08-23 LAB — COMPREHENSIVE METABOLIC PANEL
ALK PHOS: 127 U/L — AB (ref 38–126)
ALT: 12 U/L (ref 0–44)
AST: 15 U/L (ref 15–41)
Albumin: 4.4 g/dL (ref 3.5–5.0)
Anion gap: 10 (ref 5–15)
BILIRUBIN TOTAL: 0.4 mg/dL (ref 0.3–1.2)
BUN: 25 mg/dL — ABNORMAL HIGH (ref 6–20)
CALCIUM: 9.2 mg/dL (ref 8.9–10.3)
CO2: 18 mmol/L — AB (ref 22–32)
CREATININE: 1.44 mg/dL — AB (ref 0.44–1.00)
Chloride: 111 mmol/L (ref 98–111)
GFR calc non Af Amer: 39 mL/min — ABNORMAL LOW (ref >60)
GFR, EST AFRICAN AMERICAN: 45 mL/min — AB (ref >60)
Glucose, Bld: 114 mg/dL — ABNORMAL HIGH (ref 70–99)
Potassium: 4.1 mmol/L (ref 3.5–5.1)
SODIUM: 139 mmol/L (ref 135–145)
TOTAL PROTEIN: 7.7 g/dL (ref 6.5–8.1)

## 2017-08-23 LAB — CBC WITH DIFFERENTIAL/PLATELET
Basophils Absolute: 0.1 10*3/uL (ref 0–0.1)
Basophils Relative: 1 %
Eosinophils Absolute: 0.3 10*3/uL (ref 0–0.7)
Eosinophils Relative: 3 %
HCT: 39.3 % (ref 35.0–47.0)
Hemoglobin: 13.1 g/dL (ref 12.0–16.0)
LYMPHS ABS: 3.2 10*3/uL (ref 1.0–3.6)
LYMPHS PCT: 31 %
MCH: 28.3 pg (ref 26.0–34.0)
MCHC: 33.3 g/dL (ref 32.0–36.0)
MCV: 85.1 fL (ref 80.0–100.0)
MONO ABS: 0.9 10*3/uL (ref 0.2–0.9)
Monocytes Relative: 9 %
Neutro Abs: 5.9 10*3/uL (ref 1.4–6.5)
Neutrophils Relative %: 56 %
Platelets: 383 10*3/uL (ref 150–440)
RBC: 4.62 MIL/uL (ref 3.80–5.20)
RDW: 17.1 % — AB (ref 11.5–14.5)
WBC: 10.3 10*3/uL (ref 3.6–11.0)

## 2017-08-23 LAB — GLUCOSE, CAPILLARY
GLUCOSE-CAPILLARY: 125 mg/dL — AB (ref 70–99)
GLUCOSE-CAPILLARY: 101 mg/dL — AB (ref 70–99)
Glucose-Capillary: 109 mg/dL — ABNORMAL HIGH (ref 70–99)
Glucose-Capillary: 97 mg/dL (ref 70–99)

## 2017-08-23 LAB — TROPONIN I
Troponin I: <0.03 ng/mL (ref <0.03)
Troponin I: <0.03 ng/mL (ref <0.03)

## 2017-08-23 LAB — BRAIN NATRIURETIC PEPTIDE: B Natriuretic Peptide: 17 pg/mL (ref 0.0–100.0)

## 2017-08-23 MED ORDER — GI COCKTAIL ~~LOC~~
30.0000 mL | Freq: Once | ORAL | Status: AC
  Administered 2017-08-23: 30 mL via ORAL
  Filled 2017-08-23: qty 30

## 2017-08-23 MED ORDER — MORPHINE SULFATE (PF) 4 MG/ML IV SOLN: 4.0000 mg | INTRAVENOUS | Status: DC | PRN

## 2017-08-23 MED ORDER — INSULIN ASPART 100 UNIT/ML ~~LOC~~ SOLN
0.0000 [IU] | Freq: Three times a day (TID) | SUBCUTANEOUS | Status: DC
  Filled 2017-08-23 (×2): qty 1

## 2017-08-23 MED ORDER — BUPRENORPHINE HCL-NALOXONE HCL 8-2 MG SL FILM
1.0000 | ORAL_FILM | Freq: Two times a day (BID) | SUBLINGUAL | Status: DC
Start: 2017-08-23 — End: 2017-08-23

## 2017-08-23 MED ORDER — SENNOSIDES-DOCUSATE SODIUM 8.6-50 MG PO TABS: 1.0000 | ORAL_TABLET | Freq: Every evening | ORAL | Status: DC | PRN

## 2017-08-23 MED ORDER — POTASSIUM CHLORIDE CRYS ER 20 MEQ PO TBCR
20.0000 meq | EXTENDED_RELEASE_TABLET | Freq: Three times a day (TID) | ORAL | Status: DC
  Administered 2017-08-23 – 2017-08-24 (×4): 20 meq via ORAL
  Filled 2017-08-23 (×4): qty 1

## 2017-08-23 MED ORDER — DIPHENOXYLATE-ATROPINE 2.5-0.025 MG PO TABS
1.0000 | ORAL_TABLET | Freq: Three times a day (TID) | ORAL | Status: DC
  Administered 2017-08-23 – 2017-08-24 (×4): 1 via ORAL
  Filled 2017-08-23 (×4): qty 1

## 2017-08-23 MED ORDER — NITROGLYCERIN 0.4 MG SL SUBL: 0.4000 mg | SUBLINGUAL_TABLET | SUBLINGUAL | Status: DC | PRN

## 2017-08-23 MED ORDER — PAROXETINE HCL 20 MG PO TABS
40.0000 mg | ORAL_TABLET | Freq: Every day | ORAL | Status: DC
  Administered 2017-08-23 – 2017-08-24 (×2): 40 mg via ORAL
  Filled 2017-08-23 (×2): qty 2

## 2017-08-23 MED ORDER — NITROGLYCERIN 0.4 MG SL SUBL
0.4000 mg | SUBLINGUAL_TABLET | SUBLINGUAL | Status: DC | PRN
  Administered 2017-08-23: 0.4 mg via SUBLINGUAL
  Filled 2017-08-23: qty 1

## 2017-08-23 MED ORDER — BUPRENORPHINE HCL-NALOXONE HCL 8-2 MG SL SUBL: 1.0000 | SUBLINGUAL_TABLET | Freq: Two times a day (BID) | SUBLINGUAL | Status: DC

## 2017-08-23 MED ORDER — BISACODYL 5 MG PO TBEC: 5.0000 mg | DELAYED_RELEASE_TABLET | Freq: Every day | ORAL | Status: DC | PRN

## 2017-08-23 MED ORDER — CARISOPRODOL 350 MG PO TABS
350.0000 mg | ORAL_TABLET | Freq: Three times a day (TID) | ORAL | Status: DC | PRN
  Administered 2017-08-23 – 2017-08-24 (×3): 350 mg via ORAL
  Filled 2017-08-23 (×3): qty 1

## 2017-08-23 MED ORDER — BUSPIRONE HCL 5 MG PO TABS
5.0000 mg | ORAL_TABLET | Freq: Two times a day (BID) | ORAL | Status: DC
  Administered 2017-08-23 – 2017-08-24 (×3): 5 mg via ORAL
  Filled 2017-08-23 (×4): qty 1

## 2017-08-23 MED ORDER — ONDANSETRON HCL 4 MG/2ML IJ SOLN: 4.0000 mg | Freq: Four times a day (QID) | INTRAMUSCULAR | Status: DC | PRN

## 2017-08-23 MED ORDER — CARISOPRODOL 350 MG PO TABS: 350.0000 mg | ORAL_TABLET | Freq: Two times a day (BID) | ORAL | Status: DC | PRN

## 2017-08-23 MED ORDER — BUTALBITAL-APAP-CAFFEINE 50-325-40 MG PO TABS: 1.0000 | ORAL_TABLET | Freq: Four times a day (QID) | ORAL | Status: DC | PRN

## 2017-08-23 MED ORDER — MORPHINE SULFATE (PF) 2 MG/ML IV SOLN: 2.0000 mg | INTRAVENOUS | Status: DC | PRN

## 2017-08-23 MED ORDER — FUROSEMIDE 40 MG PO TABS
40.0000 mg | ORAL_TABLET | Freq: Every day | ORAL | Status: DC
  Administered 2017-08-23 – 2017-08-24 (×2): 40 mg via ORAL
  Filled 2017-08-23 (×2): qty 1

## 2017-08-23 MED ORDER — LOPERAMIDE HCL 2 MG PO CAPS
4.0000 mg | ORAL_CAPSULE | ORAL | Status: DC | PRN
  Administered 2017-08-23 – 2017-08-24 (×3): 4 mg via ORAL
  Filled 2017-08-23 (×3): qty 2

## 2017-08-23 MED ORDER — ENOXAPARIN SODIUM 40 MG/0.4ML ~~LOC~~ SOLN
40.0000 mg | SUBCUTANEOUS | Status: DC
  Administered 2017-08-23 – 2017-08-24 (×2): 40 mg via SUBCUTANEOUS
  Filled 2017-08-23 (×2): qty 0.4

## 2017-08-23 MED ORDER — HALOPERIDOL LACTATE 5 MG/ML IJ SOLN
2.5000 mg | Freq: Once | INTRAMUSCULAR | Status: AC
  Administered 2017-08-23: 2.5 mg via INTRAVENOUS
  Filled 2017-08-23: qty 1

## 2017-08-23 MED ORDER — ACETAMINOPHEN 325 MG PO TABS
650.0000 mg | ORAL_TABLET | ORAL | Status: DC | PRN
  Administered 2017-08-23: 650 mg via ORAL
  Filled 2017-08-23: qty 2

## 2017-08-23 MED ORDER — ASPIRIN EC 81 MG PO TBEC
81.0000 mg | DELAYED_RELEASE_TABLET | Freq: Every day | ORAL | Status: DC
  Administered 2017-08-23 – 2017-08-24 (×2): 81 mg via ORAL
  Filled 2017-08-23 (×2): qty 1

## 2017-08-23 MED ORDER — ALPRAZOLAM 0.5 MG PO TABS
0.5000 mg | ORAL_TABLET | Freq: Two times a day (BID) | ORAL | Status: DC
  Administered 2017-08-23 – 2017-08-24 (×3): 0.5 mg via ORAL
  Filled 2017-08-23 (×3): qty 1

## 2017-08-23 NOTE — ED Notes (Signed)
Report to Ashley, RN

## 2017-08-23 NOTE — ED Provider Notes (Signed)
[facility] Emergency Department Provider Note  ____________________________________________   First MD Initiated Contact with Patient 08/23/17 0522     (approximate)  I have reviewed the triage vital signs and the nursing notes.   HISTORY  Chief Complaint Chest Pain   HPI Whitney Bernard is a 57 y.o. female who comes to the emergency department by EMS with chest pain that began at 8 PM last night.  The pain is substernal moderate severity pressure-like radiating to both shoulders.  Associated with some nausea and shortness of breath.  No diaphoresis.  The pain is not ripping or tearing and does not go straight to her back.  No history of DVT or pulmonary embolism.  She does report that she has a remote history of a myocardial infarction while on vacation in [place] several years ago although never had a stent placed.  She does not take aspirin or Plavix.  She does not take blood thinning medication.  She does have a history of hypertension and dyslipidemia and a questionable history of CHF.  She sleeps on one pillow.  She has not been gaining weight.  She has no leg swelling.  Nothing particular seems to make her symptoms better or worse.  She was given 324 mg of aspirin in route.    Past Medical History:  Diagnosis Date  . Acute pancreatitis   . CHF (congestive heart failure) (HCC)   . Chronic pain syndrome   . COPD (chronic obstructive pulmonary disease) (HCC)   . Depression   . Diabetes mellitus without complication (HCC)   . Headache(784.0)    migraines  . Hypercholesteremia   . Hypertension   . Hyperthyroidism   . Hypokalemia   . Migraine   . Narcotic abuse (HCC)   . RA (rheumatoid arthritis) [facility])     Patient Active Problem List   Diagnosis Date Noted  . Chest pain 08/23/2017  . Protein-calorie malnutrition, severe 12/26/2014  . 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 Loose stools   . Diarrhea   . Hypokalemia 12/24/2014  . BP (high blood  pressure) 02/23/2013  . Misuse of prescription only drugs (HCC) 02/23/2013  . Anxiety 06/22/2012  . Colitis 05/11/2012  . Clinical depression 05/11/2012  . Hypercholesterolemia 05/11/2012  . Gastroduodenal ulcer 03/23/2012    Past Surgical History:  Procedure Laterality Date  . ANTERIOR CERVICAL DECOMP/DISCECTOMY FUSION N/A 11/26/2012   Procedure: ANTERIOR CERVICAL DECOMPRESSION/DISCECTOMY FUSION 2 LEVELS;  Surgeon: Reinaldo Meeker, MD;  Location: [facility] NEURO ORS;  Service: Neurosurgery;  Laterality: N/A;  ANTERIOR CERVICAL DECOMPRESSION/DISCECTOMY FUSION 2 LEVELS  . CERVICAL POLYPECTOMY    . CHOLECYSTECTOMY    . ESOPHAGOGASTRODUODENOSCOPY N/A 12/26/2014   Procedure: ESOPHAGOGASTRODUODENOSCOPY (EGD);  Surgeon: Midge Minium, MD;  Location: [facility] ENDOSCOPY;  Service: Endoscopy;  Laterality: N/A;  . FRACTURE SURGERY    . NECK SURGERY    . TUBAL LIGATION    . WISDOM TOOTH EXTRACTION      Prior to Admission medications   Medication Sig Start Date End Date Taking? Authorizing Provider  ALPRAZolam Prudy Feeler) 0.5 MG tablet Take 0.5 mg by mouth 2 (two) times daily. Panic attacks   Yes [provider]  busPIRone (BUSPAR) 5 MG tablet Take 5 mg by mouth 2 (two) times daily. 12/11/14  Yes [provider]  butalbital-acetaminophen-caffeine (FIORICET, ESGIC) 50-325-40 MG per tablet Take 1 tablet by mouth as needed. Headaches   Yes [provider]  carisoprodol (SOMA) 350 MG tablet Take 350 mg by  mouth 2 (two) times daily as needed. For muscle relaxation. 12/11/14  Yes [provider]  furosemide (LASIX) 40 MG tablet Take 40 mg by mouth daily.   Yes [provider]  PARoxetine (PAXIL) 40 MG tablet Take 40 mg by mouth daily.   Yes [provider]  potassium chloride SA (K-DUR,KLOR-CON) 20 MEQ tablet Take 20 mEq by mouth 3 (three) times daily.    Yes [provider]  diphenoxylate-atropine (LOMOTIL) 2.5-0.025 MG tablet Take 1 tablet by mouth 3  (three) times daily. 01/15/15   [provider]  SUBOXONE 8-2 MG FILM Take 1 Film by mouth 2 (two) times daily. 02/01/15   [provider]    Allergies Almond oil and Statins  Family History  Problem Relation Age of Onset  . CAD Father   . Alzheimer's disease Unknown     Social History Social History   Tobacco Use  . Smoking status: Former Smoker    Packs/day: 0.50    Years: 38.00    Pack years: 19.00    Types: Cigarettes    Last attempt to quit: 11/14/2012    Years since quitting: 4.7  . Smokeless tobacco: Never Used  Substance Use Topics  . Alcohol use: Not Currently    Comment: occ  . Drug use: No    Review of Systems Constitutional: No fever/chills Eyes: No visual changes. ENT: No sore throat. Cardiovascular: Positive for chest pain. Respiratory: Positive for shortness of breath. Gastrointestinal: No abdominal pain.  Positive for nausea, no vomiting.  No diarrhea.  No constipation. Genitourinary: Negative for dysuria. Musculoskeletal: Negative for back pain. Skin: Negative for rash. Neurological: Negative for headaches, focal weakness or numbness.   ____________________________________________   PHYSICAL EXAM:  VITAL SIGNS: ED Triage Vitals  Enc Vitals Group     BP      Pulse      Resp      Temp      Temp src      SpO2      Weight      Height      Head Circumference      Peak Flow      Pain Score      Pain Loc      Pain Edu?      Excl. in GC?     Constitutional: Alert and oriented x4 pleasant cooperative seems somewhat uncomfortable but no distress Eyes: PERRL EOMI. Head: Atraumatic. Nose: No congestion/rhinnorhea. Mouth/Throat: No trismus Neck: No stridor.  Able to lie completely flat with no JVD Cardiovascular: Normal rate, regular rhythm. Grossly normal heart sounds.  Good peripheral circulation. Respiratory: Normal respiratory effort.  No retractions. Lungs CTAB and moving good air Gastrointestinal: Soft  nontender Musculoskeletal: No lower extremity edema legs are equal in size Neurologic:  Normal speech and language. No gross focal neurologic deficits are appreciated. Skin:  Skin is warm, dry and intact. No rash noted. Psychiatric: Mood and affect are normal. Speech and behavior are normal.    ____________________________________________   DIFFERENTIAL includes but not limited to  Acute coronary syndrome, pulmonary embolism, aortic dissection, pneumothorax ____________________________________________   LABS (all labs ordered are listed, but only abnormal results are displayed)  Labs Reviewed  COMPREHENSIVE METABOLIC PANEL - Abnormal; Notable for the following components:      Result Value   CO2 18 (*)    Glucose, Bld 114 (*)    BUN 25 (*)    Creatinine, Ser 1.44 (*)    Alkaline Phosphatase  127 (*)    GFR calc non Af Amer 39 (*)    GFR calc Af Amer 45 (*)    All other components within normal limits  CBC WITH DIFFERENTIAL/PLATELET - Abnormal; Notable for the following components:   RDW 17.1 (*)    All other components within normal limits  BRAIN NATRIURETIC PEPTIDE  TROPONIN I  TROPONIN I  TROPONIN I    Lab work reviewed by me with no signs of acute ischemia x1 __________________________________________  EKG   EKG normal sinus rhythm normal axis no signs of acute ischemia ____________________________________________  RADIOLOGY  Chest x-ray reviewed by me with no acute disease ____________________________________________   PROCEDURES  Procedure(s) performed: no  Procedures  Critical Care performed: no  ____________________________________________   INITIAL IMPRESSION / ASSESSMENT AND PLAN / ED COURSE  Pertinent labs & imaging results that were available during my care of the patient were reviewed by me and considered in my medical decision making (see chart for details).   The patient arrives somewhat uncomfortable appearing however hemodynamically  stable.  Her pain is nonexertional but is pressure-like radiating to both arms associated with nausea which given her multiple risk factors is concerning.  Given nitroglycerin here which she does not like as it seemed to give her a headache.  First troponin is negative but at this point given her multiple risk factors and concerning story I do believe she requires inpatient admission for full rule out and risk stratification.  I spoke with the hospitalist who has graciously agreed to admit the patient to his service.      ____________________________________________   FINAL CLINICAL IMPRESSION(S) / ED DIAGNOSES  Final diagnoses:  Chest pain, unspecified type      NEW MEDICATIONS STARTED DURING THIS VISIT:  New Prescriptions   No medications on file     Note:  This document was prepared using Dragon voice recognition software and may include unintentional dictation errors.     Merrily Brittle, MD 08/23/17 [phone]

## 2017-08-23 NOTE — ED Triage Notes (Signed)
Pt arrives with [facility] for CP. Pt reports mid chest pain which radiates to both arms. Per EMS, pt was given 324 mg aspirin en route. EMS also reports hx of MI and CBG 123.

## 2017-08-23 NOTE — H&P (Signed)
[employer] [facility] [facility] [place] [facility]   PATIENT NAME: Whitney Bernard    MR#:  [id number]  DATE OF BIRTH:  Jun 13, 1960  DATE OF ADMISSION:  08/23/2017  PRIMARY CARE PHYSICIAN: Evelene Croon, MD   REQUESTING/REFERRING PHYSICIAN: Merrily Brittle, MD  CHIEF COMPLAINT:   Chief Complaint  Patient presents with  . Chest Pain    HISTORY OF PRESENT ILLNESS:  Whitney Bernard  is a 57 y.o. female with a known history of diet-ctrl T2NIDDM, HTN, HLD, Hx tobacco use D/O (quit 44yr ago), CKD III (baseline Cr 1.1-1.5) who p/w 1d Hx CP. Pt states her last stress test was in 2007. She states she was in her usual state of health, and was sitting on the sofa watching TV on 08/22/2017 @~2000PM, when she developed mild midchest pressure radiating to the B/L upper extremities. Pain was non-positional, non-pleuritic and non-reproducible. Her symptoms were initially mild, but she states they gradually became progressively worse over the course of the night. She endorses associated SOB, but denies diaphoresis, N/V, LH/LOC. She called EMS. She was instructed to take ASA81. This did not improve her symptoms. She states EMS administered ASA81 x4, which helped transiently. She is pain-free at the time of my assessment, and is lying comfortably and in no acute distress. She has multiple cardiac risk factors. She was a smoker up until ~60yr ago. She states her diabetes and cholesterol have been poorly controlled. She endorses a family Hx of CAD/MI. She endorses intolerance to statins (Zocor, Lipitor) in the past. She will be observed for CP eval.  PAST MEDICAL HISTORY:   Past Medical History:  Diagnosis Date  . Acute pancreatitis   . CHF (congestive heart failure) (HCC)   . Chronic pain syndrome   . COPD (chronic obstructive pulmonary disease) (HCC)   . Depression   . Diabetes mellitus without complication (HCC)   . Headache(784.0)    migraines  . Hypercholesteremia   . Hypertension   . Hyperthyroidism     . Hypokalemia   . Migraine   . Narcotic abuse (HCC)   . RA (rheumatoid arthritis) (HCC)     PAST SURGICAL HISTORY:   Past Surgical History:  Procedure Laterality Date  . ANTERIOR CERVICAL DECOMP/DISCECTOMY FUSION N/A 11/26/2012   Procedure: ANTERIOR CERVICAL DECOMPRESSION/DISCECTOMY FUSION 2 LEVELS;  Surgeon: Reinaldo Meeker, MD;  Location: [facility] NEURO ORS;  Service: Neurosurgery;  Laterality: N/A;  ANTERIOR CERVICAL DECOMPRESSION/DISCECTOMY FUSION 2 LEVELS  . CERVICAL POLYPECTOMY    . CHOLECYSTECTOMY    . ESOPHAGOGASTRODUODENOSCOPY N/A 12/26/2014   Procedure: ESOPHAGOGASTRODUODENOSCOPY (EGD);  Surgeon: Midge Minium, MD;  Location: [facility] ENDOSCOPY;  Service: Endoscopy;  Laterality: N/A;  . FRACTURE SURGERY    . NECK SURGERY    . TUBAL LIGATION    . WISDOM TOOTH EXTRACTION      SOCIAL HISTORY:   Social History   Tobacco Use  . Smoking status: Former Smoker    Packs/day: 0.50    Years: 38.00    Pack years: 19.00    Types: Cigarettes    Last attempt to quit: 11/14/2012    Years since quitting: 4.7  . Smokeless tobacco: Never Used  Substance Use Topics  . Alcohol use: Not Currently    Comment: occ    FAMILY HISTORY:   Family History  Problem Relation Age of Onset  . CAD Father   . Alzheimer's disease Unknown     DRUG ALLERGIES:   Allergies  Allergen Reactions  . Almond Oil Hives  .  Statins Other (See Comments)    Multiple statins    REVIEW OF SYSTEMS:   Review of Systems  Constitutional: Negative for chills, diaphoresis, fever, malaise/fatigue and weight loss.  HENT: Negative for congestion, ear pain, hearing loss, nosebleeds, sinus pain, sore throat and tinnitus.   Eyes: Negative for blurred vision, double vision, photophobia, pain, discharge and redness.  Respiratory: Positive for shortness of breath. Negative for cough, hemoptysis, sputum production and wheezing.   Cardiovascular: Positive for chest pain. Negative for palpitations, orthopnea, claudication,  leg swelling and PND.  Gastrointestinal: Negative for abdominal pain, blood in stool, constipation, diarrhea, heartburn, melena, nausea and vomiting.  Genitourinary: Negative for dysuria, flank pain, frequency, hematuria and urgency.  Musculoskeletal: Negative for back pain, joint pain, myalgias and neck pain.  Skin: Negative for itching and rash.  Neurological: Negative for dizziness, tingling, tremors, sensory change, speech change, focal weakness, seizures, loss of consciousness, weakness and headaches.  Psychiatric/Behavioral: Negative for memory loss. The patient does not have insomnia.    MEDICATIONS AT HOME:   Prior to Admission medications   Medication Sig Start Date End Date Taking? Authorizing Provider  ALPRAZolam Prudy Feeler) 0.5 MG tablet Take 0.5 mg by mouth 2 (two) times daily. Panic attacks   Yes [provider]  busPIRone (BUSPAR) 5 MG tablet Take 5 mg by mouth 2 (two) times daily. 12/11/14  Yes [provider]  butalbital-acetaminophen-caffeine (FIORICET, ESGIC) 50-325-40 MG per tablet Take 1 tablet by mouth as needed. Headaches   Yes [provider]  carisoprodol (SOMA) 350 MG tablet Take 350 mg by mouth 2 (two) times daily as needed. For muscle relaxation. 12/11/14  Yes [provider]  furosemide (LASIX) 40 MG tablet Take 40 mg by mouth daily.   Yes [provider]  PARoxetine (PAXIL) 40 MG tablet Take 40 mg by mouth daily.   Yes [provider]  potassium chloride SA (K-DUR,KLOR-CON) 20 MEQ tablet Take 20 mEq by mouth 3 (three) times daily.    Yes [provider]  diphenoxylate-atropine (LOMOTIL) 2.5-0.025 MG tablet Take 1 tablet by mouth 3 (three) times daily. 01/15/15   [provider]  SUBOXONE 8-2 MG FILM Take 1 Film by mouth 2 (two) times daily. 02/01/15   [provider]      VITAL SIGNS:  Blood pressure (!) 124/92, pulse 78, temperature 98.5 F (36.9 C), temperature source Oral, resp.  rate (!) 22, height 5\' 4"  (1.626 m), weight 65.8 kg (145 lb), SpO2 96 %.  PHYSICAL EXAMINATION:  Physical Exam  Constitutional: She is oriented to person, place, and time. She appears well-developed and well-nourished. She is active and cooperative.  Non-toxic appearance. She does not have a sickly appearance. She does not appear ill. No distress. She is not intubated.  HENT:  Head: Normocephalic and atraumatic.  Mouth/Throat: Oropharynx is clear and moist. No oropharyngeal exudate.  Eyes: Conjunctivae, EOM and lids are normal. No scleral icterus.  Neck: Neck supple. No JVD present. No thyromegaly present.  Cardiovascular: Normal rate, regular rhythm, S1 normal, S2 normal and normal heart sounds.  No extrasystoles are present. Exam reveals no gallop, no S3, no S4, no distant heart sounds and no friction rub.  No murmur heard. Pulmonary/Chest: Effort normal and breath sounds normal. No accessory muscle usage or stridor. No apnea, no tachypnea and no bradypnea. She is not intubated. No respiratory distress. She has no decreased breath sounds. She has no wheezes. She has no rhonchi. She has no rales.  Abdominal: Soft. Bowel  sounds are normal. She exhibits no distension. There is no tenderness. There is no rebound and no guarding.  Musculoskeletal: Normal range of motion. She exhibits no edema, tenderness or deformity.       Right lower leg: Normal. She exhibits no tenderness and no edema.       Left lower leg: Normal. She exhibits no tenderness and no edema.  Lymphadenopathy:    She has no cervical adenopathy.  Neurological: She is alert and oriented to person, place, and time. She is not disoriented.  Skin: Skin is warm, dry and intact. No rash noted. She is not diaphoretic. No erythema.  Psychiatric: Her speech is normal and behavior is normal. Judgment and thought content normal. Her mood appears anxious. Her affect is not angry, not blunt and not inappropriate. She is not agitated, not  aggressive, not hyperactive, not actively hallucinating and not combative. Cognition and memory are normal. Cognition and memory are not impaired. She does not express impulsivity or inappropriate judgment. She does not exhibit a depressed mood. She exhibits normal recent memory and normal remote memory. She is attentive.   LABORATORY PANEL:   CBC Recent Labs  Lab 08/23/17 0529  WBC 10.3  HGB 13.1  HCT 39.3  PLT 383   ------------------------------------------------------------------------------------------------------------------  Chemistries  Recent Labs  Lab 08/23/17 0529  NA 139  K 4.1  CL 111  CO2 18*  GLUCOSE 114*  BUN 25*  CREATININE 1.44*  CALCIUM 9.2  AST 15  ALT 12  ALKPHOS 127*  BILITOT 0.4   ------------------------------------------------------------------------------------------------------------------  Cardiac Enzymes Recent Labs  Lab 08/23/17 0529  TROPONINI <0.03   ------------------------------------------------------------------------------------------------------------------  RADIOLOGY:  Dg Chest Port 1 View  Result Date: 08/23/2017 CLINICAL DATA:  Chest pain and shortness of breath since 8 p.m. last night. History of diabetes. EXAM: PORTABLE CHEST 1 VIEW COMPARISON:  06/08/2015 FINDINGS: Borderline heart size with normal pulmonary vascularity. No airspace disease or consolidation in the lungs. No blunting of costophrenic angles. No pneumothorax. Postoperative changes in the cervical spine. IMPRESSION: Borderline heart size.  Lungs are clear. Electronically Signed   By: Burman Nieves M.D.   On: 08/23/2017 05:49   IMPRESSION AND PLAN:   A/P: 61F CP w/ multiple cardiac risk factors. Also w/ anxiety, hyperglycemia (w/ diet-ctrl T2NIDDM), CKD III (baseline Cr 1.1-1.5). -Trop-I (-) x1, 2x rpt pending -EKG (-) acute ischemic changes -CXR (-) acute cardiopulmonary pathology -Obs Tele, continuous cardiac monitoring -ASA81 -Morphine, NGT, O2  PRN -States lipids checked ~1wk ago, elevated; states she is on Zetia, but it is not on her medication profile; endorses statin intolerance (previously on Zocor, Lipitor); may warrant trial of Crestor -Not on beta blocker, ACE-I/ARB -NPO for non-ambulating nuclear pharmacological stress test -SSI -Cr 1.4 on present admission, at baseline; baseline CKD III (Cr 1.1-1.5), likely 2/2 DM, HTN, aged kidney; monitor BMP, avoid nephrotoxins -c/w home meds/formulary subs -NPO -Lovenox -Full code -Observation, < 2 midnights   All the records are reviewed and case discussed with ED provider. Management plans discussed with the patient, family and they are in agreement.  CODE STATUS: Full code  TOTAL TIME TAKING CARE OF THIS PATIENT: 60 minutes.    Barbaraann Rondo M.D on 08/23/2017 at 6:51 AM  Between 7am to 6pm - Pager - [phone]  After 6pm go to [email or web address] - Social research officer, government  [facility] [place] [facility]  Office  [phone]  CC: Primary care physician; Evelene Croon, MD   Note: This dictation was prepared with Dragon dictation along with  smaller phrase technology. Any transcriptional errors that result from this process are unintentional. 

## 2017-08-23 NOTE — Plan of Care (Signed)
  Problem: Education: Goal: Knowledge of General Education information will improve Outcome: Progressing   Problem: Clinical Measurements: Goal: Ability to maintain clinical measurements within normal limits will improve Outcome: Progressing Goal: Cardiovascular complication will be avoided Outcome: Progressing   Problem: Pain Managment: Goal: General experience of comfort will improve Outcome: Progressing

## 2017-08-23 NOTE — Progress Notes (Signed)
Patient ID: Whitney Bernard, female   DOB: Jul 29, 1960, 57 y.o.   MRN: [id number] patient seen chart reviewed. Came in with some chest pain. So for cardiac enzymes negative. No acute EKG changes. Continue present medical management. Patient will undergo stress test tomorrow. She is agreeable with it. Reports does not use Suboxone. Will discontinue it. She says soma helps her at home which I will continue it. IV pain medications discontinued given history of substance abuse.

## 2017-08-24 ENCOUNTER — Encounter: Payer: Self-pay | Admitting: Radiology

## 2017-08-24 ENCOUNTER — Observation Stay: Payer: 59

## 2017-08-24 DIAGNOSIS — N183 Chronic kidney disease, stage 3 (moderate): Secondary | ICD-10-CM | POA: Diagnosis not present

## 2017-08-24 DIAGNOSIS — R42 Dizziness and giddiness: Secondary | ICD-10-CM | POA: Diagnosis not present

## 2017-08-24 DIAGNOSIS — I130 Hypertensive heart and chronic kidney disease with heart failure and stage 1 through stage 4 chronic kidney disease, or unspecified chronic kidney disease: Secondary | ICD-10-CM | POA: Diagnosis not present

## 2017-08-24 DIAGNOSIS — R110 Nausea: Secondary | ICD-10-CM | POA: Diagnosis not present

## 2017-08-24 DIAGNOSIS — E1165 Type 2 diabetes mellitus with hyperglycemia: Secondary | ICD-10-CM | POA: Diagnosis not present

## 2017-08-24 DIAGNOSIS — R079 Chest pain, unspecified: Secondary | ICD-10-CM | POA: Diagnosis not present

## 2017-08-24 DIAGNOSIS — I509 Heart failure, unspecified: Secondary | ICD-10-CM | POA: Diagnosis not present

## 2017-08-24 DIAGNOSIS — G894 Chronic pain syndrome: Secondary | ICD-10-CM | POA: Diagnosis not present

## 2017-08-24 DIAGNOSIS — F419 Anxiety disorder, unspecified: Secondary | ICD-10-CM | POA: Diagnosis not present

## 2017-08-24 DIAGNOSIS — J449 Chronic obstructive pulmonary disease, unspecified: Secondary | ICD-10-CM | POA: Diagnosis not present

## 2017-08-24 DIAGNOSIS — R0789 Other chest pain: Secondary | ICD-10-CM | POA: Diagnosis not present

## 2017-08-24 DIAGNOSIS — E1122 Type 2 diabetes mellitus with diabetic chronic kidney disease: Secondary | ICD-10-CM | POA: Diagnosis not present

## 2017-08-24 DIAGNOSIS — M25512 Pain in left shoulder: Secondary | ICD-10-CM | POA: Diagnosis not present

## 2017-08-24 LAB — NM MYOCAR MULTI W/SPECT W/WALL MOTION / EF
CHL CUP NUCLEAR SSS: 7
CHL CUP RESTING HR STRESS: 85 {beats}/min
CSEPED: 1 min
CSEPEW: 1 METS
CSEPHR: 70 %
Exercise duration (sec): 0 s
LV sys vol: 17 mL
LVDIAVOL: 34 mL (ref 46–106)
MPHR: 161 {beats}/min
Peak HR: 113 {beats}/min
SRS: 0
TID: 0.94

## 2017-08-24 LAB — GLUCOSE, CAPILLARY
Glucose-Capillary: 112 mg/dL — ABNORMAL HIGH (ref 70–99)
Glucose-Capillary: 135 mg/dL — ABNORMAL HIGH (ref 70–99)

## 2017-08-24 LAB — HIV ANTIBODY (ROUTINE TESTING W REFLEX): HIV SCREEN 4TH GENERATION: NONREACTIVE

## 2017-08-24 MED ORDER — ASPIRIN 81 MG PO TBEC: 81.0000 mg | DELAYED_RELEASE_TABLET | Freq: Every day | ORAL | 0 refills | Status: DC

## 2017-08-24 MED ORDER — TECHNETIUM TC 99M TETROFOSMIN IV KIT
12.7700 | PACK | Freq: Once | INTRAVENOUS | Status: AC | PRN
  Administered 2017-08-24: 12.77 via INTRAVENOUS

## 2017-08-24 MED ORDER — REGADENOSON 0.4 MG/5ML IV SOLN
0.4000 mg | Freq: Once | INTRAVENOUS | Status: AC
  Administered 2017-08-24: 0.4 mg via INTRAVENOUS
  Filled 2017-08-24: qty 5

## 2017-08-24 MED ORDER — TECHNETIUM TC 99M TETROFOSMIN IV KIT
32.3000 | PACK | Freq: Once | INTRAVENOUS | Status: AC | PRN
  Administered 2017-08-24: 32.3 via INTRAVENOUS

## 2017-08-24 NOTE — Discharge Summary (Signed)
[facility] - [place] at [facility]   PATIENT NAME: Whitney Bernard    MR#:  [id number]  DATE OF BIRTH:  1961-01-23  DATE OF ADMISSION:  08/23/2017 ADMITTING PHYSICIAN: Barbaraann Rondo, MD  DATE OF DISCHARGE: 08/24/2017  PRIMARY CARE PHYSICIAN: Evelene Croon, MD    ADMISSION DIAGNOSIS:  Chest pain, unspecified type [R07.9]  DISCHARGE DIAGNOSIS:  Chest pain non cardiac--no clear etiology  Chronic pain syndrome SECONDARY DIAGNOSIS:   Past Medical History:  Diagnosis Date  . Acute pancreatitis   . CHF (congestive heart failure) (HCC)   . Chronic pain syndrome   . COPD (chronic obstructive pulmonary disease) (HCC)   . Depression   . Diabetes mellitus without complication (HCC)   . Headache(784.0)    migraines  . Hypercholesteremia   . Hypertension   . Hyperthyroidism   . Hypokalemia   . Migraine   . Narcotic abuse (HCC)   . RA (rheumatoid arthritis) [facility])     HOSPITAL COURSE:  Whitney Bernard is a 57 y.o. female who comes to the emergency department by EMS with chest pain that began at 8 PM last night.  The pain is substernal moderate severity pressure-like radiating to both shoulders.  Associated with some nausea and shortness of breath.  No diaphoresis  1. Chest pain with left shoulder pain--non cardiac -troponin x 3 negative -Myoview stress test negative EF 50-55% -Asa 81 mg daily  2/Chronic pain syndrome with chronic pain med use Cont home meds  3.DVT prophylaxis ambulatory  D/c home  CONSULTS OBTAINED:  Treatment Team:  Barbaraann Rondo, MD  DRUG ALLERGIES:   Allergies  Allergen Reactions  . Almond Oil Hives  . Statins Other (See Comments)    Multiple statins    DISCHARGE MEDICATIONS:   Allergies as of 08/24/2017      Reactions   Almond Oil Hives   Statins Other (See Comments)   Multiple statins      Medication List    STOP taking these medications   SUBOXONE 8-2 MG Film Generic drug:  Buprenorphine HCl-Naloxone  HCl     TAKE these medications   ALPRAZolam 0.5 MG tablet Commonly known as:  XANAX Take 0.5 mg by mouth 2 (two) times daily. Panic attacks   aspirin 81 MG EC tablet Take 1 tablet (81 mg total) by mouth daily. Start taking on:  08/25/2017   busPIRone 5 MG tablet Commonly known as:  BUSPAR Take 5 mg by mouth 2 (two) times daily.   butalbital-acetaminophen-caffeine 50-325-40 MG tablet Commonly known as:  FIORICET, ESGIC Take 1 tablet by mouth as needed. Headaches   carisoprodol 350 MG tablet Commonly known as:  SOMA Take 350 mg by mouth 2 (two) times daily as needed. For muscle relaxation.   diphenoxylate-atropine 2.5-0.025 MG tablet Commonly known as:  LOMOTIL Take 1 tablet by mouth 3 (three) times daily.   furosemide 40 MG tablet Commonly known as:  LASIX Take 40 mg by mouth daily.   PARoxetine 40 MG tablet Commonly known as:  PAXIL Take 40 mg by mouth daily.   potassium chloride SA 20 MEQ tablet Commonly known as:  K-DUR,KLOR-CON Take 20 mEq by mouth 3 (three) times daily.       If you experience worsening of your admission symptoms, develop shortness of breath, life threatening emergency, suicidal or homicidal thoughts you must seek medical attention immediately by calling 911 or calling your MD immediately  if symptoms less severe.  You Must read complete instructions/literature along with  all the possible adverse reactions/side effects for all the Medicines you take and that have been prescribed to you. Take any new Medicines after you have completely understood and accept all the possible adverse reactions/side effects.   Please note  You were cared for by a hospitalist during your hospital stay. If you have any questions about your discharge medications or the care you received while you were in the hospital after you are discharged, you can call the unit and asked to speak with the hospitalist on call if the hospitalist that took care of you is not available.  Once you are discharged, your primary care physician will handle any further medical issues. Please note that NO REFILLS for any discharge medications will be authorized once you are discharged, as it is imperative that you return to your primary care physician (or establish a relationship with a primary care physician if you do not have one) for your aftercare needs so that they can reassess your need for medications and monitor your lab values. Today   SUBJECTIVE  No cp Left shoulder pain   VITAL SIGNS:  Blood pressure (!) 144/61, pulse 97, temperature 98.6 F (37 C), temperature source Oral, resp. rate 18, height 5\' 4"  (1.626 m), weight 65.8 kg (145 lb), SpO2 99 %.  I/O:    Intake/Output Summary (Last 24 hours) at 08/24/2017 1300 Last data filed at 08/24/2017 0900 Gross per 24 hour  Intake 480 ml  Output 900 ml  Net -420 ml    PHYSICAL EXAMINATION:  GENERAL:  57 y.o.-year-old patient lying in the bed with no acute distress.  EYES: Pupils equal, round, reactive to light and accommodation. No scleral icterus. Extraocular muscles intact.  HEENT: Head atraumatic, normocephalic. Oropharynx and nasopharynx clear.  NECK:  Supple, no jugular venous distention. No thyroid enlargement, no tenderness.  LUNGS: Normal breath sounds bilaterally, no wheezing, rales,rhonchi or crepitation. No use of accessory muscles of respiration.  CARDIOVASCULAR: S1, S2 normal. No murmurs, rubs, or gallops.  ABDOMEN: Soft, non-tender, non-distended. Bowel sounds present. No organomegaly or mass.  EXTREMITIES: No pedal edema, cyanosis, or clubbing.  NEUROLOGIC: Cranial nerves II through XII are intact. Muscle strength 5/5 in all extremities. Sensation intact. Gait not checked.  PSYCHIATRIC: The patient is alert and oriented x 3.  SKIN: No obvious rash, lesion, or ulcer.   DATA REVIEW:   CBC  Recent Labs  Lab 08/23/17 0529  WBC 10.3  HGB 13.1  HCT 39.3  PLT 383    Chemistries  Recent Labs  Lab  08/23/17 0529  NA 139  K 4.1  CL 111  CO2 18*  GLUCOSE 114*  BUN 25*  CREATININE 1.44*  CALCIUM 9.2  AST 15  ALT 12  ALKPHOS 127*  BILITOT 0.4    Microbiology Results   No results found for this or any previous visit (from the past 240 hour(s)).  RADIOLOGY:  Nm Myocar Multi W/spect W/wall Motion / Ef  Result Date: 08/24/2017  This is a low risk study.  The study is normal.  The left ventricular ejection fraction is normal (55-65%).  There was no ST segment deviation noted during stress.  negative lexiscan stress LV function normal Low risk study   Dg Chest Port 1 View  Result Date: 08/23/2017 CLINICAL DATA:  Chest pain and shortness of breath since 8 p.m. last night. History of diabetes. EXAM: PORTABLE CHEST 1 VIEW COMPARISON:  06/08/2015 FINDINGS: Borderline heart size with normal pulmonary vascularity. No airspace disease or consolidation  in the lungs. No blunting of costophrenic angles. No pneumothorax. Postoperative changes in the cervical spine. IMPRESSION: Borderline heart size.  Lungs are clear. Electronically Signed   By: Burman Nieves M.D.   On: 08/23/2017 05:49     Management plans discussed with the patient, family and they are in agreement.  CODE STATUS:     Code Status Orders  (From admission, onward)        Start     Ordered   08/23/17 0806  Full code  Continuous     08/23/17 0805    Code Status History    Date Active Date Inactive Code Status Order ID Comments User Context   12/24/2014 1444 12/27/2014 1535 Full Code [id number]  Milagros Loll, MD ED   11/26/2012 1541 11/27/2012 1537 Full Code [id number]  Kritzer, Rolanda Lundborg, MD Inpatient      TOTAL TIME TAKING CARE OF THIS PATIENT: *40* minutes.    Enedina Finner M.D on 08/24/2017 at 1:00 PM  Between 7am to 6pm - Pager - [phone] After 6pm go to [email or web address] - Social research officer, government  [facility]  Office  [phone]  CC: Primary care physician; Evelene Croon, MD

## 2017-08-24 NOTE — Progress Notes (Signed)
Pt discharged home with son, VSS, requesting pain medication prior to discharge, this RN informed pt that it is not time for her pain med yet. Follow up information provided and appointments and prescription info given.

## 2017-08-24 NOTE — Care Management (Signed)
Observation for chest pain.  negative troponins. For stress test today and would anticipate discharge if negative.  At present there is no cardiac consult.  Verbally confirmed with primary nurse that patient is NPO.

## 2017-08-24 NOTE — Plan of Care (Signed)
  Problem: Clinical Measurements: Goal: Ability to maintain clinical measurements within normal limits will improve Outcome: Adequate for Discharge   Problem: Pain Managment: Goal: General experience of comfort will improve Outcome: Adequate for Discharge   

## 2017-09-07 IMAGING — CR DG ELBOW 2V*R*
1 series · 2 of 2 positions shown · non-contrast
Comparison: None.

CLINICAL DATA: Status post fall 2 weeks ago.

EXAM:
RIGHT ELBOW - 2 VIEW

[Series 1: dg elbow complete right · 0.14mm/px · 2 of 2 slices shown]
[im 1/2]
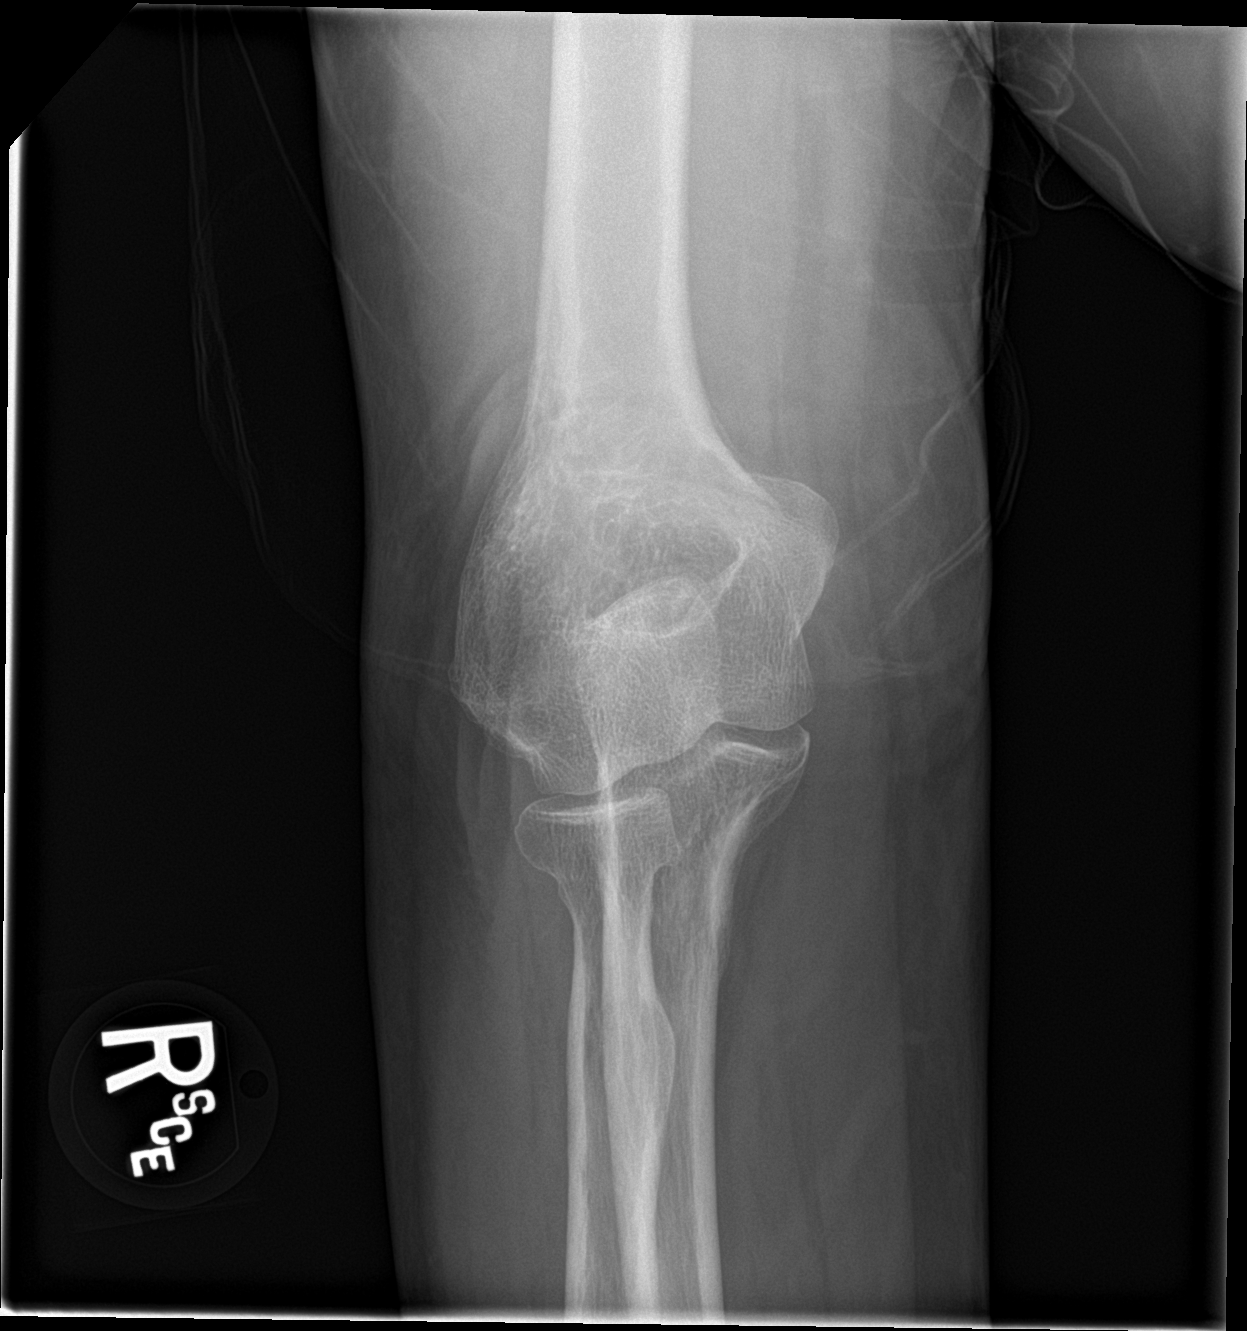
[im 2/2]
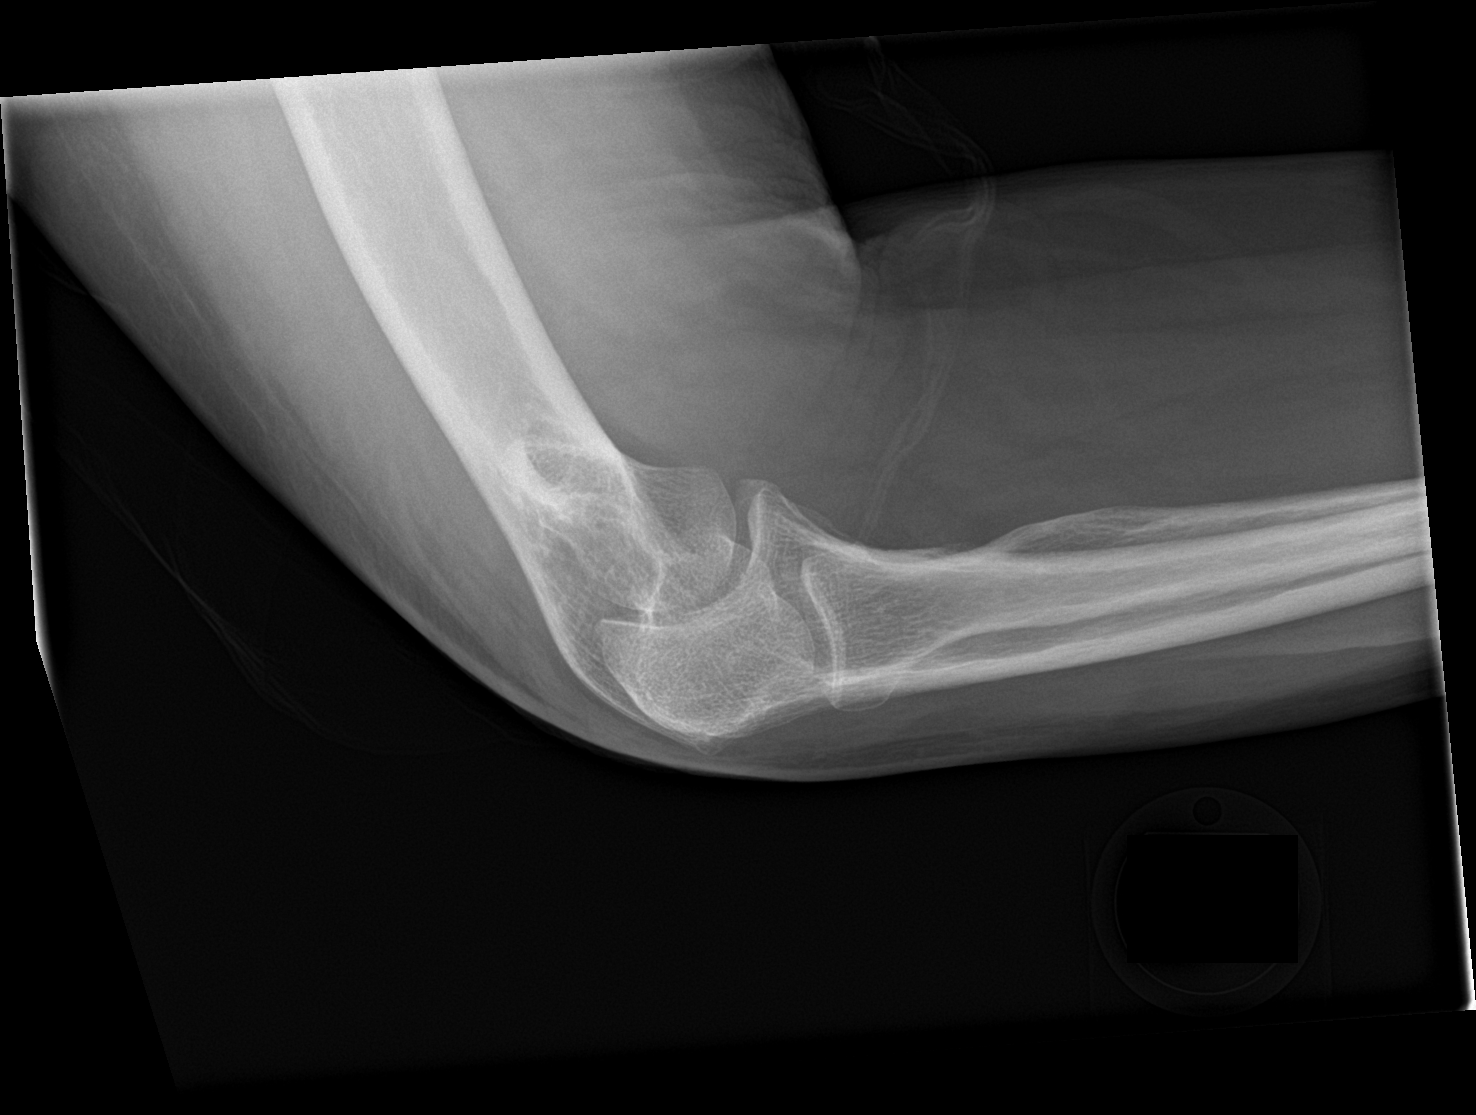

[2 of 2 positions shown; findings below may reference images not displayed]

FINDINGS: There is no evidence of fracture, dislocation, or joint effusion.
There is no evidence of arthropathy or other focal bone abnormality.
Soft tissues are unremarkable.
IMPRESSION: No acute osseous injury of the right elbow.

## 2017-09-09 DIAGNOSIS — M542 Cervicalgia: Secondary | ICD-10-CM | POA: Diagnosis not present

## 2017-09-09 DIAGNOSIS — R079 Chest pain, unspecified: Secondary | ICD-10-CM | POA: Diagnosis not present

## 2017-09-09 DIAGNOSIS — E119 Type 2 diabetes mellitus without complications: Secondary | ICD-10-CM | POA: Diagnosis not present

## 2017-09-09 DIAGNOSIS — R1030 Lower abdominal pain, unspecified: Secondary | ICD-10-CM | POA: Diagnosis not present

## 2017-09-09 DIAGNOSIS — K5730 Diverticulosis of large intestine without perforation or abscess without bleeding: Secondary | ICD-10-CM | POA: Diagnosis not present

## 2017-09-09 DIAGNOSIS — F419 Anxiety disorder, unspecified: Secondary | ICD-10-CM | POA: Diagnosis not present

## 2017-10-12 ENCOUNTER — Other Ambulatory Visit: Payer: Self-pay

## 2017-10-12 ENCOUNTER — Encounter: Payer: Self-pay | Admitting: Emergency Medicine

## 2017-10-12 ENCOUNTER — Emergency Department
Admission: EM | Admit: 2017-10-12 | Discharge: 2017-10-13 | Disposition: A | Discharge status: Home / Self Care | Payer: 59 | Attending: Emergency Medicine | Admitting: Emergency Medicine

## 2017-10-12 DIAGNOSIS — Z79899 Other long term (current) drug therapy: Secondary | ICD-10-CM | POA: Diagnosis not present

## 2017-10-12 DIAGNOSIS — I509 Heart failure, unspecified: Secondary | ICD-10-CM | POA: Diagnosis not present

## 2017-10-12 DIAGNOSIS — R1084 Generalized abdominal pain: Secondary | ICD-10-CM | POA: Diagnosis not present

## 2017-10-12 DIAGNOSIS — K529 Noninfective gastroenteritis and colitis, unspecified: Secondary | ICD-10-CM | POA: Diagnosis not present

## 2017-10-12 DIAGNOSIS — M069 Rheumatoid arthritis, unspecified: Secondary | ICD-10-CM | POA: Insufficient documentation

## 2017-10-12 DIAGNOSIS — I110 Hypertensive heart disease with heart failure: Secondary | ICD-10-CM | POA: Insufficient documentation

## 2017-10-12 DIAGNOSIS — R197 Diarrhea, unspecified: Secondary | ICD-10-CM

## 2017-10-12 DIAGNOSIS — Z87891 Personal history of nicotine dependence: Secondary | ICD-10-CM | POA: Diagnosis not present

## 2017-10-12 DIAGNOSIS — A09 Infectious gastroenteritis and colitis, unspecified: Secondary | ICD-10-CM | POA: Diagnosis not present

## 2017-10-12 DIAGNOSIS — K5732 Diverticulitis of large intestine without perforation or abscess without bleeding: Secondary | ICD-10-CM | POA: Diagnosis not present

## 2017-10-12 DIAGNOSIS — E119 Type 2 diabetes mellitus without complications: Secondary | ICD-10-CM | POA: Insufficient documentation

## 2017-10-12 DIAGNOSIS — R109 Unspecified abdominal pain: Secondary | ICD-10-CM | POA: Diagnosis present

## 2017-10-12 DIAGNOSIS — N3001 Acute cystitis with hematuria: Secondary | ICD-10-CM | POA: Insufficient documentation

## 2017-10-12 DIAGNOSIS — R079 Chest pain, unspecified: Secondary | ICD-10-CM | POA: Diagnosis not present

## 2017-10-12 LAB — URINALYSIS, COMPLETE (UACMP) WITH MICROSCOPIC
SPECIFIC GRAVITY, URINE: 1.014 (ref 1.005–1.030)
WBC, UA: >50 WBC/hpf — ABNORMAL HIGH (ref 0–5)

## 2017-10-12 LAB — CBC
HCT: 37.4 % (ref 35.0–47.0)
Hemoglobin: 12.2 g/dL (ref 12.0–16.0)
MCH: 28.1 pg (ref 26.0–34.0)
MCHC: 32.5 g/dL (ref 32.0–36.0)
MCV: 86.3 fL (ref 80.0–100.0)
PLATELETS: 472 10*3/uL — AB (ref 150–440)
RBC: 4.33 MIL/uL (ref 3.80–5.20)
RDW: 16.8 % — AB (ref 11.5–14.5)
WBC: 12.5 10*3/uL — AB (ref 3.6–11.0)

## 2017-10-12 LAB — COMPREHENSIVE METABOLIC PANEL
ALT: 33 U/L (ref 0–44)
AST: 37 U/L (ref 15–41)
Albumin: 3.6 g/dL (ref 3.5–5.0)
Alkaline Phosphatase: 138 U/L — ABNORMAL HIGH (ref 38–126)
Anion gap: 7 (ref 5–15)
BUN: 10 mg/dL (ref 6–20)
CALCIUM: 9 mg/dL (ref 8.9–10.3)
CO2: 20 mmol/L — ABNORMAL LOW (ref 22–32)
CREATININE: 1.12 mg/dL — AB (ref 0.44–1.00)
Chloride: 113 mmol/L — ABNORMAL HIGH (ref 98–111)
GFR calc Af Amer: >60 mL/min (ref >60)
GFR, EST NON AFRICAN AMERICAN: 53 mL/min — AB (ref >60)
Glucose, Bld: 126 mg/dL — ABNORMAL HIGH (ref 70–99)
Potassium: 3.9 mmol/L (ref 3.5–5.1)
Sodium: 140 mmol/L (ref 135–145)
Total Bilirubin: 0.9 mg/dL (ref 0.3–1.2)
Total Protein: 6.8 g/dL (ref 6.5–8.1)

## 2017-10-12 LAB — LIPASE, BLOOD: Lipase: 21 U/L (ref 11–51)

## 2017-10-12 NOTE — ED Triage Notes (Signed)
Pt to triage via w/c with no distress noted, brought in by EMS for c/o diarrhea x 4 days with lower abd pain; st hx of same with diverticulitis

## 2017-10-13 ENCOUNTER — Emergency Department: Payer: 59

## 2017-10-13 ENCOUNTER — Encounter: Payer: Self-pay | Admitting: Radiology

## 2017-10-13 DIAGNOSIS — E119 Type 2 diabetes mellitus without complications: Secondary | ICD-10-CM | POA: Diagnosis not present

## 2017-10-13 DIAGNOSIS — I110 Hypertensive heart disease with heart failure: Secondary | ICD-10-CM | POA: Diagnosis not present

## 2017-10-13 DIAGNOSIS — K529 Noninfective gastroenteritis and colitis, unspecified: Secondary | ICD-10-CM | POA: Diagnosis not present

## 2017-10-13 DIAGNOSIS — Z87891 Personal history of nicotine dependence: Secondary | ICD-10-CM | POA: Diagnosis not present

## 2017-10-13 DIAGNOSIS — I509 Heart failure, unspecified: Secondary | ICD-10-CM | POA: Diagnosis not present

## 2017-10-13 DIAGNOSIS — Z79899 Other long term (current) drug therapy: Secondary | ICD-10-CM | POA: Diagnosis not present

## 2017-10-13 DIAGNOSIS — M069 Rheumatoid arthritis, unspecified: Secondary | ICD-10-CM | POA: Diagnosis not present

## 2017-10-13 DIAGNOSIS — N3001 Acute cystitis with hematuria: Secondary | ICD-10-CM | POA: Diagnosis not present

## 2017-10-13 DIAGNOSIS — K5732 Diverticulitis of large intestine without perforation or abscess without bleeding: Secondary | ICD-10-CM | POA: Diagnosis not present

## 2017-10-13 LAB — TROPONIN I

## 2017-10-13 MED ORDER — IOPAMIDOL (ISOVUE-300) INJECTION 61%
100.0000 mL | Freq: Once | INTRAVENOUS | Status: AC | PRN
  Administered 2017-10-13: 100 mL via INTRAVENOUS

## 2017-10-13 MED ORDER — ONDANSETRON HCL 4 MG/2ML IJ SOLN
4.0000 mg | Freq: Once | INTRAMUSCULAR | Status: AC
  Administered 2017-10-13: 4 mg via INTRAVENOUS
  Filled 2017-10-13: qty 2

## 2017-10-13 MED ORDER — MORPHINE SULFATE (PF) 4 MG/ML IV SOLN
4.0000 mg | Freq: Once | INTRAVENOUS | Status: AC
  Administered 2017-10-13: 4 mg via INTRAVENOUS
  Filled 2017-10-13: qty 1

## 2017-10-13 MED ORDER — METRONIDAZOLE 500 MG PO TABS
500.0000 mg | ORAL_TABLET | Freq: Once | ORAL | Status: AC
  Administered 2017-10-13: 500 mg via ORAL
  Filled 2017-10-13: qty 1

## 2017-10-13 MED ORDER — SODIUM CHLORIDE 0.9 % IV SOLN
1.0000 g | Freq: Once | INTRAVENOUS | Status: AC
  Administered 2017-10-13: 1 g via INTRAVENOUS
  Filled 2017-10-13: qty 10

## 2017-10-13 MED ORDER — METRONIDAZOLE 500 MG PO TABS: 500.0000 mg | ORAL_TABLET | Freq: Two times a day (BID) | ORAL | 0 refills | Status: AC

## 2017-10-13 MED ORDER — CIPROFLOXACIN HCL 500 MG PO TABS: 500.0000 mg | ORAL_TABLET | Freq: Two times a day (BID) | ORAL | 0 refills | Status: AC

## 2017-10-13 MED ORDER — OXYCODONE-ACETAMINOPHEN 5-325 MG PO TABS: 2.0000 | ORAL_TABLET | ORAL | 0 refills | Status: DC | PRN

## 2017-10-13 MED ORDER — SODIUM CHLORIDE 0.9 % IV BOLUS
1000.0000 mL | Freq: Once | INTRAVENOUS | Status: AC
  Administered 2017-10-13: 1000 mL via INTRAVENOUS

## 2017-10-13 NOTE — Discharge Instructions (Addendum)
Please follow up with your primary care physician for further evaluation of your symptoms.  °

## 2017-10-13 NOTE — ED Provider Notes (Signed)
[facility] Emergency Department Provider Note   ____________________________________________   First MD Initiated Contact with Patient 10/12/17 2359     (approximate)  I have reviewed the triage vital signs and the nursing notes.   HISTORY  Chief Complaint Abdominal Pain    HPI Whitney Bernard is a 57 y.o. female who comes into the hospital today with some abdominal pain and diarrhea.  The patient has been having symptoms for the past week.  Today she had some chest pain so she decided to come in and get checked out.  The patient is hurting across her mid abdomen.  She denies any nausea or vomiting but is been having diarrhea every 2 hours.  Her diarrhea is brown and watery.  She states that today she had some mid chest pain with dizziness but no shortness of breath and no sweats.  The patient rates her abdominal pain a 6 out of 10 in intensity in her chest pain a 2-3 out of 10 in intensity.  The patient reports that she mainly just feels tight in her chest.  The patient is also had some urinary frequency.  The patient is here today for evaluation.   Past Medical History:  Diagnosis Date  . Acute pancreatitis   . CHF (congestive heart failure) (HCC)   . Chronic pain syndrome   . COPD (chronic obstructive pulmonary disease) (HCC)   . Depression   . Diabetes mellitus without complication (HCC)   . Headache(784.0)    migraines  . Hypercholesteremia   . Hypertension   . Hyperthyroidism   . Hypokalemia   . Migraine   . Narcotic abuse (HCC)   . RA (rheumatoid arthritis) [facility])     Patient Active Problem List   Diagnosis Date Noted  . Chest pain 08/23/2017  . Protein-calorie malnutrition, severe 12/26/2014  . 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 Loose stools   . Diarrhea   . Hypokalemia 12/24/2014  . BP (high blood pressure) 02/23/2013  . Misuse of prescription only drugs (HCC) 02/23/2013  . Anxiety 06/22/2012  . Colitis 05/11/2012  . Clinical  depression 05/11/2012  . Hypercholesterolemia 05/11/2012  . Gastroduodenal ulcer 03/23/2012    Past Surgical History:  Procedure Laterality Date  . ANTERIOR CERVICAL DECOMP/DISCECTOMY FUSION N/A 11/26/2012   Procedure: ANTERIOR CERVICAL DECOMPRESSION/DISCECTOMY FUSION 2 LEVELS;  Surgeon: Reinaldo Meeker, MD;  Location: [facility] NEURO ORS;  Service: Neurosurgery;  Laterality: N/A;  ANTERIOR CERVICAL DECOMPRESSION/DISCECTOMY FUSION 2 LEVELS  . CERVICAL POLYPECTOMY    . CHOLECYSTECTOMY    . ESOPHAGOGASTRODUODENOSCOPY N/A 12/26/2014   Procedure: ESOPHAGOGASTRODUODENOSCOPY (EGD);  Surgeon: Midge Minium, MD;  Location: [facility] ENDOSCOPY;  Service: Endoscopy;  Laterality: N/A;  . FRACTURE SURGERY    . NECK SURGERY    . TUBAL LIGATION    . WISDOM TOOTH EXTRACTION      Prior to Admission medications   Medication Sig Start Date End Date Taking? Authorizing Provider  ALPRAZolam Prudy Feeler) 0.5 MG tablet Take 0.5 mg by mouth 2 (two) times daily. Panic attacks    [provider]  aspirin EC 81 MG EC tablet Take 1 tablet (81 mg total) by mouth daily. 08/25/17   Enedina Finner, MD  busPIRone (BUSPAR) 5 MG tablet Take 5 mg by mouth 2 (two) times daily. 12/11/14   [provider]  butalbital-acetaminophen-caffeine (FIORICET, ESGIC) 50-325-40 MG per tablet Take 1 tablet by mouth as needed. Headaches    [provider]  carisoprodol (SOMA) 350 MG tablet Take  350 mg by mouth 2 (two) times daily as needed. For muscle relaxation. 12/11/14   [provider]  ciprofloxacin (CIPRO) 500 MG tablet Take 1 tablet (500 mg total) by mouth 2 (two) times daily for 7 days. 10/13/17 10/20/17  Rebecka Apley, MD  diphenoxylate-atropine (LOMOTIL) 2.5-0.025 MG tablet Take 1 tablet by mouth 3 (three) times daily. 01/15/15   [provider]  furosemide (LASIX) 40 MG tablet Take 40 mg by mouth daily.    [provider]  metroNIDAZOLE (FLAGYL) 500 MG tablet Take 1 tablet (500 mg total) by mouth  2 (two) times daily for 7 days. 10/13/17 10/20/17  Rebecka Apley, MD  oxyCODONE-acetaminophen (PERCOCET/ROXICET) 5-325 MG tablet Take 2 tablets by mouth every 4 (four) hours as needed for severe pain. 10/13/17   Rebecka Apley, MD  PARoxetine (PAXIL) 40 MG tablet Take 40 mg by mouth daily.    [provider]  potassium chloride SA (K-DUR,KLOR-CON) 20 MEQ tablet Take 20 mEq by mouth 3 (three) times daily.     [provider]    Allergies Almond oil and Statins  Family History  Problem Relation Age of Onset  . CAD Father   . Alzheimer's disease Unknown     Social History Social History   Tobacco Use  . Smoking status: Former Smoker    Packs/day: 0.50    Years: 38.00    Pack years: 19.00    Types: Cigarettes    Last attempt to quit: 11/14/2012    Years since quitting: 4.9  . Smokeless tobacco: Never Used  Substance Use Topics  . Alcohol use: Not Currently    Comment: occ  . Drug use: No    Review of Systems  Constitutional: No fever/chills Eyes: No visual changes. ENT: No sore throat. Cardiovascular: Denies chest pain. Respiratory: Denies shortness of breath. Gastrointestinal: No abdominal pain.  No nausea, no vomiting.  No diarrhea.  No constipation. Genitourinary: Negative for dysuria. Musculoskeletal: Negative for back pain. Skin: Negative for rash. Neurological: Negative for headaches, focal weakness or numbness.   ____________________________________________   PHYSICAL EXAM:  VITAL SIGNS: ED Triage Vitals  Enc Vitals Group     BP 10/12/17 2123 (!) 153/88     Pulse Rate 10/12/17 2123 94     Resp 10/12/17 2123 18     Temp 10/12/17 2123 98.5 F (36.9 C)     Temp Source 10/12/17 2123 Oral     SpO2 10/12/17 2123 92 %     Weight 10/12/17 2123 145 lb (65.8 kg)     Height 10/12/17 2123 5\' 4"  (1.626 m)     Head Circumference --      Peak Flow --      Pain Score 10/12/17 2122 6     Pain Loc --      Pain Edu? --      Excl. in GC? --       Constitutional: Alert and oriented. Well appearing and in moderate distress. Eyes: Conjunctivae are normal. PERRL. EOMI. Head: Atraumatic. Nose: No congestion/rhinnorhea. Mouth/Throat: Mucous membranes are moist.  Oropharynx non-erythematous. Cardiovascular: Normal rate, regular rhythm. Grossly normal heart sounds.  Good peripheral circulation. Respiratory: Normal respiratory effort.  No retractions. Lungs CTAB. Gastrointestinal: Soft with some diffuse mid abd tenderness to palpation. No distention.  Musculoskeletal: No lower extremity tenderness nor edema.   Neurologic:  Normal speech and language.  Skin:  Skin is warm, dry and intact.  Psychiatric: Mood and affect are normal.  ____________________________________________   LABS (all labs ordered are listed, but only abnormal results are displayed)  Labs Reviewed  COMPREHENSIVE METABOLIC PANEL - Abnormal; Notable for the following components:      Result Value   Chloride 113 (*)    CO2 20 (*)    Glucose, Bld 126 (*)    Creatinine, Ser 1.12 (*)    Alkaline Phosphatase 138 (*)    GFR calc non Af Amer 53 (*)    All other components within normal limits  CBC - Abnormal; Notable for the following components:   WBC 12.5 (*)    RDW 16.8 (*)    Platelets 472 (*)    All other components within normal limits  URINALYSIS, COMPLETE (UACMP) WITH MICROSCOPIC - Abnormal; Notable for the following components:   Color, Urine ORANGE (*)    APPearance CLEAR (*)    Glucose, UA   (*)    Value: TEST NOT REPORTED DUE TO COLOR INTERFERENCE OF URINE PIGMENT   Hgb urine dipstick   (*)    Value: TEST NOT REPORTED DUE TO COLOR INTERFERENCE OF URINE PIGMENT   Bilirubin Urine   (*)    Value: TEST NOT REPORTED DUE TO COLOR INTERFERENCE OF URINE PIGMENT   Ketones, ur   (*)    Value: TEST NOT REPORTED DUE TO COLOR INTERFERENCE OF URINE PIGMENT   Protein, ur   (*)    Value: TEST NOT REPORTED DUE TO COLOR INTERFERENCE OF URINE PIGMENT   Nitrite    (*)    Value: TEST NOT REPORTED DUE TO COLOR INTERFERENCE OF URINE PIGMENT   Leukocytes, UA   (*)    Value: TEST NOT REPORTED DUE TO COLOR INTERFERENCE OF URINE PIGMENT   WBC, UA >50 (*)    Bacteria, UA MANY (*)    All other components within normal limits  URINE CULTURE  LIPASE, BLOOD  TROPONIN I  TROPONIN I   ____________________________________________  EKG  ED ECG REPORT I, Rebecka Apley, the attending physician, personally viewed and interpreted this ECG.   Date: 10/13/2017  EKG Time: 157  Rate: 82  Rhythm: normal sinus rhythm  Axis: normal  Intervals:none  ST&T Change: none  ____________________________________________  RADIOLOGY  ED MD interpretation:  CT abd and pelvis: Segmental thickening of the transverse colon concerning for colitis, clinical correlation recommended, no bowel obstruction, normal appendix, sigmoid diverticulosis without associated active inflammatory changes.  Mild aortic atherosclerosis.  Official radiology report(s): Ct Abdomen Pelvis W Contrast  Result Date: 10/13/2017 CLINICAL DATA:  57 year old female with acute abdominal pain. Concern for diverticulitis. EXAM: CT ABDOMEN AND PELVIS WITH CONTRAST TECHNIQUE: Multidetector CT imaging of the abdomen and pelvis was performed using the standard protocol following bolus administration of intravenous contrast. CONTRAST:  ISOVUE-300 IOPAMIDOL (ISOVUE-300) INJECTION 61% COMPARISON:  CT of the abdomen pelvis dated 11/01/2013 FINDINGS: Lower chest: There are minimal bibasilar dependent atelectatic changes. The visualized lung bases are otherwise clear. Borderline cardiomegaly. No intra-abdominal free air or free fluid. Hepatobiliary: The liver is unremarkable. There is mild intrahepatic biliary ductal dilatation, likely post cholecystectomy. Pancreas: Unremarkable. No pancreatic ductal dilatation or surrounding inflammatory changes. Spleen: Normal in size without focal abnormality.  Adrenals/Urinary Tract: Mild nodularity of the left adrenal gland measure 8 mm in thickness. The right adrenal gland is unremarkable. 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 There is no hydronephrosis on either side. There is symmetric  enhancement and excretion of contrast by both kidneys. The visualized ureters appear unremarkable. Mild thickened appearance of the urinary bladder may be related to underdistention or chronic bladder dysfunction. Correlation with urinalysis recommended to exclude cystitis. Stomach/Bowel: There is sigmoid diverticulosis with muscular hypertrophy. No associated active inflammation. There is segmental thickening of the transverse colon concerning for colitis. Clinical correlation is recommended. There is no bowel obstruction. Loose stool noted within the colon. The appendix is normal. Vascular/Lymphatic: Mild aortoiliac atherosclerotic disease. No portal venous gas. There is no adenopathy. Reproductive: The uterus is anteverted and grossly unremarkable. No adnexal masses. Other: None Musculoskeletal: No acute or significant osseous findings. IMPRESSION: 1. Segmental thickening of the transverse colon concerning for colitis. Clinical correlation is recommended. No bowel obstruction. Normal appendix. 2. Sigmoid diverticulosis without associated active inflammatory changes. 3. Mild Aortic Atherosclerosis (ICD10-I70.0). Electronically Signed   By: Elgie Collard M.D.   On: 10/13/2017 02:33    ____________________________________________   PROCEDURES  Procedure(s) performed: None  Procedures  Critical Care performed: No  ____________________________________________   INITIAL IMPRESSION / ASSESSMENT AND PLAN / ED COURSE  As part of my medical decision making, I reviewed the following data within the electronic MEDICAL RECORD NUMBER Notes from prior ED visits and [place]  Controlled Substance Database   This is a 57 year old female who comes into the hospital today with some abdominal pain, diarrhea and chest pain.  The patient has been having diarrhea and some diffuse abdominal pain.  We did check some blood work on the patient to include a CBC, CMP and a troponin.  We also checked a urinalysis.  The patient's white blood cell count is 12.5 and it appears that the patient has greater than 50 white blood cells in her urine as well as bacteria.  I am concerned for infection.  I will give the patient a liter of normal saline as well as morphine and Zofran.  I will also give the patient a dose of ceftriaxone.  I sent the patient for a CT scan looking for any other cause of her diarrhea and abdominal pain.  The patient will be reassessed when she is received her medicines and I received all of her results.  The patient did receive a dose of metronidazole for her colitis.  It appears that the patient has both colitis and urinary tract infection.  She does feel improved.  She will be discharged home to follow-up with her primary care physician.  The patient has no further questions or concerns.      ____________________________________________   FINAL CLINICAL IMPRESSION(S) / ED DIAGNOSES  Final diagnoses:  Colitis  Acute cystitis with hematuria  Diarrhea of presumed infectious origin  Generalized abdominal pain     ED Discharge Orders         Ordered    ciprofloxacin (CIPRO) 500 MG tablet  2 times daily     10/13/17 0319    metroNIDAZOLE (FLAGYL) 500 MG tablet  2 times daily     10/13/17 0319    oxyCODONE-acetaminophen (PERCOCET/ROXICET) 5-325 MG tablet  Every 4 hours PRN     10/13/17 0319           Note:  This document was prepared using Dragon voice recognition software and may include unintentional dictation errors.    Rebecka Apley, MD 10/13/17 [phone]

## 2017-10-15 LAB — URINE CULTURE: Culture: >=100000 — AB

## 2017-11-05 DIAGNOSIS — F329 Major depressive disorder, single episode, unspecified: Secondary | ICD-10-CM | POA: Diagnosis not present

## 2017-11-05 DIAGNOSIS — J449 Chronic obstructive pulmonary disease, unspecified: Secondary | ICD-10-CM | POA: Diagnosis not present

## 2017-11-05 DIAGNOSIS — E119 Type 2 diabetes mellitus without complications: Secondary | ICD-10-CM | POA: Diagnosis not present

## 2017-11-05 DIAGNOSIS — M542 Cervicalgia: Secondary | ICD-10-CM | POA: Diagnosis not present

## 2017-12-18 ENCOUNTER — Other Ambulatory Visit: Payer: Self-pay

## 2017-12-18 ENCOUNTER — Emergency Department
Admission: EM | Admit: 2017-12-18 | Discharge: 2017-12-18 | Disposition: A | Discharge status: Home / Self Care | Payer: 59 | Attending: Emergency Medicine | Admitting: Emergency Medicine

## 2017-12-18 DIAGNOSIS — E119 Type 2 diabetes mellitus without complications: Secondary | ICD-10-CM | POA: Insufficient documentation

## 2017-12-18 DIAGNOSIS — J449 Chronic obstructive pulmonary disease, unspecified: Secondary | ICD-10-CM | POA: Insufficient documentation

## 2017-12-18 DIAGNOSIS — I110 Hypertensive heart disease with heart failure: Secondary | ICD-10-CM | POA: Insufficient documentation

## 2017-12-18 DIAGNOSIS — Z79899 Other long term (current) drug therapy: Secondary | ICD-10-CM | POA: Insufficient documentation

## 2017-12-18 DIAGNOSIS — F329 Major depressive disorder, single episode, unspecified: Secondary | ICD-10-CM | POA: Insufficient documentation

## 2017-12-18 DIAGNOSIS — Z87891 Personal history of nicotine dependence: Secondary | ICD-10-CM | POA: Diagnosis not present

## 2017-12-18 DIAGNOSIS — F419 Anxiety disorder, unspecified: Secondary | ICD-10-CM | POA: Insufficient documentation

## 2017-12-18 DIAGNOSIS — I509 Heart failure, unspecified: Secondary | ICD-10-CM | POA: Insufficient documentation

## 2017-12-18 DIAGNOSIS — Z7982 Long term (current) use of aspirin: Secondary | ICD-10-CM | POA: Insufficient documentation

## 2017-12-18 DIAGNOSIS — R1032 Left lower quadrant pain: Secondary | ICD-10-CM | POA: Diagnosis present

## 2017-12-18 DIAGNOSIS — Z9049 Acquired absence of other specified parts of digestive tract: Secondary | ICD-10-CM | POA: Insufficient documentation

## 2017-12-18 DIAGNOSIS — R079 Chest pain, unspecified: Secondary | ICD-10-CM | POA: Diagnosis not present

## 2017-12-18 DIAGNOSIS — K5792 Diverticulitis of intestine, part unspecified, without perforation or abscess without bleeding: Secondary | ICD-10-CM | POA: Diagnosis not present

## 2017-12-18 LAB — CBC WITH DIFFERENTIAL/PLATELET
ABS IMMATURE GRANULOCYTES: 0.03 10*3/uL (ref 0.00–0.07)
BASOS ABS: 0.1 10*3/uL (ref 0.0–0.1)
Basophils Relative: 1 %
Eosinophils Absolute: 0.2 10*3/uL (ref 0.0–0.5)
Eosinophils Relative: 3 %
HEMATOCRIT: 39.1 % (ref 36.0–46.0)
HEMOGLOBIN: 12 g/dL (ref 12.0–15.0)
Immature Granulocytes: 0 %
LYMPHS ABS: 2.8 10*3/uL (ref 0.7–4.0)
LYMPHS PCT: 32 %
MCH: 28.2 pg (ref 26.0–34.0)
MCHC: 30.7 g/dL (ref 30.0–36.0)
MCV: 91.8 fL (ref 80.0–100.0)
Monocytes Absolute: 0.9 10*3/uL (ref 0.1–1.0)
Monocytes Relative: 10 %
NEUTROS ABS: 4.8 10*3/uL (ref 1.7–7.7)
Neutrophils Relative %: 54 %
Platelets: 434 10*3/uL — ABNORMAL HIGH (ref 150–400)
RBC: 4.26 MIL/uL (ref 3.87–5.11)
RDW: 14.8 % (ref 11.5–15.5)
WBC: 8.8 10*3/uL (ref 4.0–10.5)
nRBC: 0 % (ref 0.0–0.2)

## 2017-12-18 LAB — LIPASE, BLOOD: Lipase: 29 U/L (ref 11–51)

## 2017-12-18 LAB — TROPONIN I

## 2017-12-18 LAB — COMPREHENSIVE METABOLIC PANEL
ALBUMIN: 4.1 g/dL (ref 3.5–5.0)
ALT: 19 U/L (ref 0–44)
AST: 16 U/L (ref 15–41)
Alkaline Phosphatase: 119 U/L (ref 38–126)
Anion gap: 10 (ref 5–15)
BUN: 22 mg/dL — ABNORMAL HIGH (ref 6–20)
CHLORIDE: 108 mmol/L (ref 98–111)
CO2: 20 mmol/L — AB (ref 22–32)
CREATININE: 1.47 mg/dL — AB (ref 0.44–1.00)
Calcium: 8.9 mg/dL (ref 8.9–10.3)
GFR calc non Af Amer: 38 mL/min — ABNORMAL LOW (ref >60)
GFR, EST AFRICAN AMERICAN: 44 mL/min — AB (ref >60)
GLUCOSE: 127 mg/dL — AB (ref 70–99)
Potassium: 3.9 mmol/L (ref 3.5–5.1)
SODIUM: 138 mmol/L (ref 135–145)
Total Bilirubin: 0.4 mg/dL (ref 0.3–1.2)
Total Protein: 8.3 g/dL — ABNORMAL HIGH (ref 6.5–8.1)

## 2017-12-18 MED ORDER — AMOXICILLIN-POT CLAVULANATE 875-125 MG PO TABS: 1.0000 | ORAL_TABLET | Freq: Two times a day (BID) | ORAL | 0 refills | Status: AC

## 2017-12-18 MED ORDER — AMOXICILLIN-POT CLAVULANATE 875-125 MG PO TABS
1.0000 | ORAL_TABLET | Freq: Once | ORAL | Status: AC
  Administered 2017-12-18: 1 via ORAL
  Filled 2017-12-18: qty 1

## 2017-12-18 MED ORDER — HYDROCODONE-ACETAMINOPHEN 5-325 MG PO TABS: 1.0000 | ORAL_TABLET | Freq: Four times a day (QID) | ORAL | 0 refills | Status: DC | PRN

## 2017-12-18 MED ORDER — HYDROCODONE-ACETAMINOPHEN 5-325 MG PO TABS
2.0000 | ORAL_TABLET | Freq: Once | ORAL | Status: AC
  Administered 2017-12-18: 2 via ORAL
  Filled 2017-12-18: qty 2

## 2017-12-18 NOTE — ED Triage Notes (Signed)
Pt to the er for pain to the chest that has moved into the abdomen. Pt reports some SOB with the abd pain. Pain started Monday or Tuesday in the chest and moved to the abdomen. No distress at this time.

## 2017-12-18 NOTE — Discharge Instructions (Addendum)
Fortunately today your lab work was reassuring.  It does seem like you probably have early diverticulitis so please resume your diverticulitis diet and begin taking your antibiotics as prescribed.  It is critically important you follow-up with your primary care physician this coming Monday for an abdominal recheck.  Return to the emergency department sooner for any new or worsening symptoms such as fevers, chills, worsening pain, or for any other issues whatsoever.  It was a pleasure to take care of you today, and thank you for coming to our emergency department.  If you have any questions or concerns before leaving please ask the nurse to grab me and I'm more than happy to go through your aftercare instructions again.  If you were prescribed any opioid pain medication today such as Norco, Vicodin, Percocet, morphine, hydrocodone, or oxycodone please make sure you do not drive when you are taking this medication as it can alter your ability to drive safely.  If you have any concerns once you are home that you are not improving or are in fact getting worse before you can make it to your follow-up appointment, please do not hesitate to call 911 and come back for further evaluation.  Merrily Brittle, MD  Results for orders placed or performed during the hospital encounter of 12/18/17  Comprehensive metabolic panel  Result Value Ref Range   Sodium 138 135 - 145 mmol/L   Potassium 3.9 3.5 - 5.1 mmol/L   Chloride 108 98 - 111 mmol/L   CO2 20 (L) 22 - 32 mmol/L   Glucose, Bld 127 (H) 70 - 99 mg/dL   BUN 22 (H) 6 - 20 mg/dL   Creatinine, Ser 0.35 (H) 0.44 - 1.00 mg/dL   Calcium 8.9 8.9 - 00.9 mg/dL   Total Protein 8.3 (H) 6.5 - 8.1 g/dL   Albumin 4.1 3.5 - 5.0 g/dL   AST 16 15 - 41 U/L   ALT 19 0 - 44 U/L   Alkaline Phosphatase 119 38 - 126 U/L   Total Bilirubin 0.4 0.3 - 1.2 mg/dL   GFR calc non Af Amer 38 (L) >60 mL/min   GFR calc Af Amer 44 (L) >60 mL/min   Anion gap 10 5 - 15  Lipase, blood    Result Value Ref Range   Lipase 29 11 - 51 U/L  CBC with Differential  Result Value Ref Range   WBC 8.8 4.0 - 10.5 K/uL   RBC 4.26 3.87 - 5.11 MIL/uL   Hemoglobin 12.0 12.0 - 15.0 g/dL   HCT 38.1 82.9 - 93.7 %   MCV 91.8 80.0 - 100.0 fL   MCH 28.2 26.0 - 34.0 pg   MCHC 30.7 30.0 - 36.0 g/dL   RDW 16.9 67.8 - 93.8 %   Platelets 434 (H) 150 - 400 K/uL   nRBC 0.0 0.0 - 0.2 %   Neutrophils Relative % 54 %   Neutro Abs 4.8 1.7 - 7.7 K/uL   Lymphocytes Relative 32 %   Lymphs Abs 2.8 0.7 - 4.0 K/uL   Monocytes Relative 10 %   Monocytes Absolute 0.9 0.1 - 1.0 K/uL   Eosinophils Relative 3 %   Eosinophils Absolute 0.2 0.0 - 0.5 K/uL   Basophils Relative 1 %   Basophils Absolute 0.1 0.0 - 0.1 K/uL   Immature Granulocytes 0 %   Abs Immature Granulocytes 0.03 0.00 - 0.07 K/uL  Troponin I  Result Value Ref Range   Troponin I <0.03 <0.03 ng/mL

## 2017-12-18 NOTE — ED Notes (Signed)
Pt unhooked to go to the bedside commode. 

## 2017-12-18 NOTE — ED Provider Notes (Signed)
[facility] Emergency Department Provider Note  ____________________________________________   First MD Initiated Contact with Patient 12/18/17 [phone]     (approximate)  I have reviewed the triage vital signs and the nursing notes.   HISTORY  Chief Complaint Abdominal Pain   HPI Whitney Bernard is a 57 y.o. female comes to the emergency department via EMS with abdominal pain for the past 3 or 4 days.  She says her pain began initially in her lower chest and 4 days ago she called 911.  EMS came to her home and offered to transport her to the hospital however she declined.  Ever since then the pain is been primarily in her left lower quadrant.  She has had both diverticulitis and colitis in the past.  She has never had intestinal surgery although does have a remote cholecystectomy.  She denies constipation or diarrhea.  No hematochezia.  Some nausea but no vomiting.  Pain is mostly constant moderate severity although does come in waves when it is more severe.  This does feel like previous episodes of diverticulitis.  She denies dysuria frequency or hesitancy.  The pain is nonradiating.    Past Medical History:  Diagnosis Date  . Acute pancreatitis   . CHF (congestive heart failure) (HCC)   . Chronic pain syndrome   . COPD (chronic obstructive pulmonary disease) (HCC)   . Depression   . Diabetes mellitus without complication (HCC)   . Headache(784.0)    migraines  . Hypercholesteremia   . Hypertension   . Hyperthyroidism   . Hypokalemia   . Migraine   . Narcotic abuse (HCC)   . RA (rheumatoid arthritis) [facility])     Patient Active Problem List   Diagnosis Date Noted  . Chest pain 08/23/2017  . Protein-calorie malnutrition, severe 12/26/2014  . 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 Loose stools   . Diarrhea   . Hypokalemia 12/24/2014  . BP (high blood pressure) 02/23/2013  . Misuse of prescription only drugs (HCC) 02/23/2013  . Anxiety 06/22/2012  . Colitis  05/11/2012  . Clinical depression 05/11/2012  . Hypercholesterolemia 05/11/2012  . Gastroduodenal ulcer 03/23/2012    Past Surgical History:  Procedure Laterality Date  . ANTERIOR CERVICAL DECOMP/DISCECTOMY FUSION N/A 11/26/2012   Procedure: ANTERIOR CERVICAL DECOMPRESSION/DISCECTOMY FUSION 2 LEVELS;  Surgeon: Reinaldo Meeker, MD;  Location: [facility] NEURO ORS;  Service: Neurosurgery;  Laterality: N/A;  ANTERIOR CERVICAL DECOMPRESSION/DISCECTOMY FUSION 2 LEVELS  . CERVICAL POLYPECTOMY    . CHOLECYSTECTOMY    . ESOPHAGOGASTRODUODENOSCOPY N/A 12/26/2014   Procedure: ESOPHAGOGASTRODUODENOSCOPY (EGD);  Surgeon: Midge Minium, MD;  Location: [facility] ENDOSCOPY;  Service: Endoscopy;  Laterality: N/A;  . FRACTURE SURGERY    . NECK SURGERY    . TUBAL LIGATION    . WISDOM TOOTH EXTRACTION      Prior to Admission medications   Medication Sig Start Date End Date Taking? Authorizing Provider  ALPRAZolam Prudy Feeler) 0.5 MG tablet Take 0.5 mg by mouth 2 (two) times daily. Panic attacks    [provider]  amoxicillin-clavulanate (AUGMENTIN) 875-125 MG tablet Take 1 tablet by mouth 2 (two) times daily for 10 days. 12/18/17 12/28/17  Merrily Brittle, MD  aspirin EC 81 MG EC tablet Take 1 tablet (81 mg total) by mouth daily. 08/25/17   Enedina Finner, MD  busPIRone (BUSPAR) 5 MG tablet Take 5 mg by mouth 2 (two) times daily. 12/11/14   [provider]  butalbital-acetaminophen-caffeine (FIORICET, ESGIC) 50-325-40 MG per tablet Take 1  tablet by mouth as needed. Headaches    [provider]  carisoprodol (SOMA) 350 MG tablet Take 350 mg by mouth 2 (two) times daily as needed. For muscle relaxation. 12/11/14   [provider]  diphenoxylate-atropine (LOMOTIL) 2.5-0.025 MG tablet Take 1 tablet by mouth 3 (three) times daily. 01/15/15   [provider]  furosemide (LASIX) 40 MG tablet Take 40 mg by mouth daily.    [provider]  HYDROcodone-acetaminophen (NORCO) 5-325 MG  tablet Take 1 tablet by mouth every 6 (six) hours as needed for up to 7 doses for severe pain. 12/18/17   Merrily Brittle, MD  oxyCODONE-acetaminophen (PERCOCET/ROXICET) 5-325 MG tablet Take 2 tablets by mouth every 4 (four) hours as needed for severe pain. 10/13/17   Rebecka Apley, MD  PARoxetine (PAXIL) 40 MG tablet Take 40 mg by mouth daily.    [provider]  potassium chloride SA (K-DUR,KLOR-CON) 20 MEQ tablet Take 20 mEq by mouth 3 (three) times daily.     [provider]    Allergies Almond oil and Statins  Family History  Problem Relation Age of Onset  . CAD Father   . Alzheimer's disease Unknown     Social History Social History   Tobacco Use  . Smoking status: Former Smoker    Packs/day: 0.50    Years: 38.00    Pack years: 19.00    Types: Cigarettes    Last attempt to quit: 11/14/2012    Years since quitting: 5.0  . Smokeless tobacco: Never Used  Substance Use Topics  . Alcohol use: Not Currently    Comment: occ  . Drug use: No    Review of Systems Constitutional: No fever/chills Eyes: No visual changes. ENT: No sore throat. Cardiovascular: Positive for chest pain. Respiratory: Denies shortness of breath. Gastrointestinal: Positive for abdominal pain.  Positive for nausea, no vomiting.  No diarrhea.  No constipation. Genitourinary: Negative for dysuria. Musculoskeletal: Negative for back pain. Skin: Negative for rash. Neurological: Negative for headaches, focal weakness or numbness.   ____________________________________________   PHYSICAL EXAM:  VITAL SIGNS: ED Triage Vitals  Enc Vitals Group     BP      Pulse      Resp      Temp      Temp src      SpO2      Weight      Height      Head Circumference      Peak Flow      Pain Score      Pain Loc      Pain Edu?      Excl. in GC?     Constitutional: Alert and oriented x4 joking laughing well-appearing nontoxic no diaphoresis speaks full clear sentences Eyes: PERRL  EOMI. Head: Atraumatic. Nose: No congestion/rhinnorhea. Mouth/Throat: No trismus Neck: No stridor.   Cardiovascular: Normal rate, regular rhythm. Grossly normal heart sounds.  Good peripheral circulation. Respiratory: Normal respiratory effort.  No retractions. Lungs CTAB and moving good air Gastrointestinal: Soft somewhat tender in the left lower quadrant with mild guarding although no frank peritonitis.  Negative Rovsing's Musculoskeletal: No lower extremity edema   Neurologic:  Normal speech and language. No gross focal neurologic deficits are appreciated. Skin:  Skin is warm, dry and intact. No rash noted. Psychiatric: Mood and affect are normal. Speech and behavior are normal.    ____________________________________________   DIFFERENTIAL includes but not limited to  Diverticulitis, colitis, appendicitis, bowel obstruction, pyelonephritis,  acute coronary syndrome ____________________________________________   LABS (all labs ordered are listed, but only abnormal results are displayed)  Labs Reviewed  COMPREHENSIVE METABOLIC PANEL - Abnormal; Notable for the following components:      Result Value   CO2 20 (*)    Glucose, Bld 127 (*)    BUN 22 (*)    Creatinine, Ser 1.47 (*)    Total Protein 8.3 (*)    GFR calc non Af Amer 38 (*)    GFR calc Af Amer 44 (*)    All other components within normal limits  CBC WITH DIFFERENTIAL/PLATELET - Abnormal; Notable for the following components:   Platelets 434 (*)    All other components within normal limits  LIPASE, BLOOD  TROPONIN I  URINALYSIS, COMPLETE (UACMP) WITH MICROSCOPIC    Lab work reviewed by me with creatinine at her baseline.  Otherwise no acute disease noted __________________________________________  EKG  ED ECG REPORT I, Merrily Brittle, the attending physician, personally viewed and interpreted this ECG.  Date: 12/18/2017 EKG Time:  Rate: 77 Rhythm: normal sinus rhythm QRS Axis: normal Intervals:  normal ST/T Wave abnormalities: normal Narrative Interpretation: no evidence of acute ischemia  ____________________________________________  RADIOLOGY   ____________________________________________   PROCEDURES  Procedure(s) performed: no  Procedures  Critical Care performed: no  ____________________________________________   INITIAL IMPRESSION / ASSESSMENT AND PLAN / ED COURSE  Pertinent labs & imaging results that were available during my care of the patient were reviewed by me and considered in my medical decision making (see chart for details).   As part of my medical decision making, I reviewed the following data within the electronic MEDICAL RECORD NUMBER History obtained from family if available, nursing notes, old chart and ekg, as well as notes from prior ED visits.  Patient comes to the emergency department with cramping left lower quadrant pain and nausea that feels similar to previous episodes of diverticulitis.  She has had on chart review 6 CT scans of her abdomen pelvis in the last several years.  I discussed with the patient and together we both agree we would like to try to avoid ionizing radiation if possible today.  Given 2 tabs of Norco for pain control and will reevaluate once her lab work is back.  EKG is nonischemic.  The patient's lab work is quite reassuring with a normal white count and no suggestion of dehydration etc.  I do think is reasonable to give her a trial of Augmentin for 10 days with the first dose here today.  I will also give her a short course of hydrocodone.  She understands that she is to return should her symptoms worsen and at that point we would progress onto CT scan.  Do not suspect perforation or abscess at this point.  Strict return precautions have been given.      ____________________________________________   FINAL CLINICAL IMPRESSION(S) / ED DIAGNOSES  Final diagnoses:  Diverticulitis      NEW MEDICATIONS STARTED DURING  THIS VISIT:  Discharge Medication List as of 12/18/2017  6:56 AM    START taking these medications   Details  amoxicillin-clavulanate (AUGMENTIN) 875-125 MG tablet Take 1 tablet by mouth 2 (two) times daily for 10 days., Starting Fri 12/18/2017, Until Mon 12/28/2017, Print    HYDROcodone-acetaminophen (NORCO) 5-325 MG tablet Take 1 tablet by mouth every 6 (six) hours as needed for up to 7 doses for severe pain., Starting Fri 12/18/2017, Print  Note:  This document was prepared using Dragon voice recognition software and may include unintentional dictation errors.     Merrily Brittle, MD 12/18/17 [phone]

## 2018-01-26 DIAGNOSIS — N189 Chronic kidney disease, unspecified: Secondary | ICD-10-CM | POA: Diagnosis not present

## 2018-01-26 DIAGNOSIS — E119 Type 2 diabetes mellitus without complications: Secondary | ICD-10-CM | POA: Diagnosis not present

## 2018-01-26 DIAGNOSIS — F419 Anxiety disorder, unspecified: Secondary | ICD-10-CM | POA: Diagnosis not present

## 2018-01-26 DIAGNOSIS — E7800 Pure hypercholesterolemia, unspecified: Secondary | ICD-10-CM | POA: Diagnosis not present

## 2018-01-26 DIAGNOSIS — M542 Cervicalgia: Secondary | ICD-10-CM | POA: Diagnosis not present

## 2018-04-26 DIAGNOSIS — M542 Cervicalgia: Secondary | ICD-10-CM | POA: Diagnosis not present

## 2018-04-26 DIAGNOSIS — F419 Anxiety disorder, unspecified: Secondary | ICD-10-CM | POA: Diagnosis not present

## 2018-04-26 DIAGNOSIS — E119 Type 2 diabetes mellitus without complications: Secondary | ICD-10-CM | POA: Diagnosis not present

## 2018-04-26 DIAGNOSIS — E7800 Pure hypercholesterolemia, unspecified: Secondary | ICD-10-CM | POA: Diagnosis not present

## 2018-04-26 DIAGNOSIS — F329 Major depressive disorder, single episode, unspecified: Secondary | ICD-10-CM | POA: Diagnosis not present

## 2018-05-02 ENCOUNTER — Encounter: Payer: Self-pay | Admitting: Emergency Medicine

## 2018-05-02 ENCOUNTER — Emergency Department: Payer: 59

## 2018-05-02 ENCOUNTER — Observation Stay
Admission: EM | Admit: 2018-05-02 | Discharge: 2018-05-03 | Disposition: A | Discharge status: Home / Self Care | Payer: 59 | Attending: Internal Medicine | Admitting: Internal Medicine

## 2018-05-02 ENCOUNTER — Other Ambulatory Visit: Payer: Self-pay

## 2018-05-02 ENCOUNTER — Observation Stay (HOSPITAL_BASED_OUTPATIENT_CLINIC_OR_DEPARTMENT_OTHER)
Admit: 2018-05-02 | Discharge: 2018-05-02 | Disposition: A | Discharge status: Home / Self Care | Payer: 59 | Attending: Physician Assistant | Admitting: Physician Assistant

## 2018-05-02 DIAGNOSIS — R0789 Other chest pain: Principal | ICD-10-CM | POA: Diagnosis present

## 2018-05-02 DIAGNOSIS — G894 Chronic pain syndrome: Secondary | ICD-10-CM | POA: Diagnosis not present

## 2018-05-02 DIAGNOSIS — Z87891 Personal history of nicotine dependence: Secondary | ICD-10-CM | POA: Diagnosis not present

## 2018-05-02 DIAGNOSIS — Z79899 Other long term (current) drug therapy: Secondary | ICD-10-CM | POA: Insufficient documentation

## 2018-05-02 DIAGNOSIS — E785 Hyperlipidemia, unspecified: Secondary | ICD-10-CM | POA: Insufficient documentation

## 2018-05-02 DIAGNOSIS — Z7982 Long term (current) use of aspirin: Secondary | ICD-10-CM | POA: Insufficient documentation

## 2018-05-02 DIAGNOSIS — E876 Hypokalemia: Secondary | ICD-10-CM | POA: Diagnosis not present

## 2018-05-02 DIAGNOSIS — E039 Hypothyroidism, unspecified: Secondary | ICD-10-CM | POA: Insufficient documentation

## 2018-05-02 DIAGNOSIS — Z8249 Family history of ischemic heart disease and other diseases of the circulatory system: Secondary | ICD-10-CM | POA: Diagnosis not present

## 2018-05-02 DIAGNOSIS — N390 Urinary tract infection, site not specified: Secondary | ICD-10-CM | POA: Insufficient documentation

## 2018-05-02 DIAGNOSIS — E1122 Type 2 diabetes mellitus with diabetic chronic kidney disease: Secondary | ICD-10-CM | POA: Insufficient documentation

## 2018-05-02 DIAGNOSIS — E0590 Thyrotoxicosis, unspecified without thyrotoxic crisis or storm: Secondary | ICD-10-CM | POA: Diagnosis not present

## 2018-05-02 DIAGNOSIS — F329 Major depressive disorder, single episode, unspecified: Secondary | ICD-10-CM | POA: Insufficient documentation

## 2018-05-02 DIAGNOSIS — I130 Hypertensive heart and chronic kidney disease with heart failure and stage 1 through stage 4 chronic kidney disease, or unspecified chronic kidney disease: Secondary | ICD-10-CM | POA: Insufficient documentation

## 2018-05-02 DIAGNOSIS — J449 Chronic obstructive pulmonary disease, unspecified: Secondary | ICD-10-CM | POA: Diagnosis not present

## 2018-05-02 DIAGNOSIS — Z888 Allergy status to other drugs, medicaments and biological substances status: Secondary | ICD-10-CM | POA: Insufficient documentation

## 2018-05-02 DIAGNOSIS — I509 Heart failure, unspecified: Secondary | ICD-10-CM | POA: Insufficient documentation

## 2018-05-02 DIAGNOSIS — R0602 Shortness of breath: Secondary | ICD-10-CM | POA: Diagnosis not present

## 2018-05-02 DIAGNOSIS — R079 Chest pain, unspecified: Secondary | ICD-10-CM

## 2018-05-02 DIAGNOSIS — I10 Essential (primary) hypertension: Secondary | ICD-10-CM | POA: Diagnosis not present

## 2018-05-02 DIAGNOSIS — I200 Unstable angina: Secondary | ICD-10-CM | POA: Diagnosis not present

## 2018-05-02 DIAGNOSIS — G8929 Other chronic pain: Secondary | ICD-10-CM | POA: Diagnosis not present

## 2018-05-02 DIAGNOSIS — N183 Chronic kidney disease, stage 3 (moderate): Secondary | ICD-10-CM | POA: Insufficient documentation

## 2018-05-02 DIAGNOSIS — I340 Nonrheumatic mitral (valve) insufficiency: Secondary | ICD-10-CM | POA: Diagnosis not present

## 2018-05-02 DIAGNOSIS — R069 Unspecified abnormalities of breathing: Secondary | ICD-10-CM | POA: Diagnosis not present

## 2018-05-02 DIAGNOSIS — M069 Rheumatoid arthritis, unspecified: Secondary | ICD-10-CM | POA: Diagnosis not present

## 2018-05-02 LAB — CBC
HCT: 38.3 % (ref 36.0–46.0)
HEMOGLOBIN: 11.8 g/dL — AB (ref 12.0–15.0)
MCH: 27.7 pg (ref 26.0–34.0)
MCHC: 30.8 g/dL (ref 30.0–36.0)
MCV: 89.9 fL (ref 80.0–100.0)
Platelets: 389 10*3/uL (ref 150–400)
RBC: 4.26 MIL/uL (ref 3.87–5.11)
RDW: 14.3 % (ref 11.5–15.5)
WBC: 10.1 10*3/uL (ref 4.0–10.5)
nRBC: 0 % (ref 0.0–0.2)

## 2018-05-02 LAB — COMPREHENSIVE METABOLIC PANEL
ALT: 16 U/L (ref 0–44)
AST: 18 U/L (ref 15–41)
Albumin: 3.6 g/dL (ref 3.5–5.0)
Alkaline Phosphatase: 102 U/L (ref 38–126)
Anion gap: 10 (ref 5–15)
BUN: 21 mg/dL — ABNORMAL HIGH (ref 6–20)
CO2: 20 mmol/L — ABNORMAL LOW (ref 22–32)
Calcium: 9.3 mg/dL (ref 8.9–10.3)
Chloride: 110 mmol/L (ref 98–111)
Creatinine, Ser: 1.39 mg/dL — ABNORMAL HIGH (ref 0.44–1.00)
GFR calc Af Amer: 48 mL/min — ABNORMAL LOW (ref >60)
GFR calc non Af Amer: 41 mL/min — ABNORMAL LOW (ref >60)
Glucose, Bld: 100 mg/dL — ABNORMAL HIGH (ref 70–99)
Potassium: 4 mmol/L (ref 3.5–5.1)
Sodium: 140 mmol/L (ref 135–145)
Total Bilirubin: 0.2 mg/dL — ABNORMAL LOW (ref 0.3–1.2)
Total Protein: 7.5 g/dL (ref 6.5–8.1)

## 2018-05-02 LAB — URINALYSIS, COMPLETE (UACMP) WITH MICROSCOPIC
BILIRUBIN URINE: NEGATIVE
Glucose, UA: NEGATIVE mg/dL
Hgb urine dipstick: NEGATIVE
Ketones, ur: NEGATIVE mg/dL
Nitrite: POSITIVE — AB
Protein, ur: 30 mg/dL — AB
SPECIFIC GRAVITY, URINE: 1.023 (ref 1.005–1.030)
pH: 5 (ref 5.0–8.0)

## 2018-05-02 LAB — LIPID PANEL
Cholesterol: 213 mg/dL — ABNORMAL HIGH (ref 0–200)
HDL: 30 mg/dL — ABNORMAL LOW (ref >40)
LDL Cholesterol: 135 mg/dL — ABNORMAL HIGH (ref 0–99)
Total CHOL/HDL Ratio: 7.1 RATIO
Triglycerides: 242 mg/dL — ABNORMAL HIGH (ref <150)
VLDL: 48 mg/dL — ABNORMAL HIGH (ref 0–40)

## 2018-05-02 LAB — TROPONIN I
Troponin I: <0.03 ng/mL (ref <0.03)
Troponin I: <0.03 ng/mL (ref <0.03)
Troponin I: <0.03 ng/mL (ref <0.03)
Troponin I: <0.03 ng/mL (ref <0.03)

## 2018-05-02 LAB — HEMOGLOBIN A1C
HEMOGLOBIN A1C: 6.1 % — AB (ref 4.8–5.6)
Mean Plasma Glucose: 128.37 mg/dL

## 2018-05-02 LAB — APTT: APTT: 26 s (ref 24–36)

## 2018-05-02 LAB — LIPASE, BLOOD: Lipase: 26 U/L (ref 11–51)

## 2018-05-02 MED ORDER — PANTOPRAZOLE SODIUM 40 MG PO TBEC
40.0000 mg | DELAYED_RELEASE_TABLET | Freq: Every day | ORAL | Status: DC
  Administered 2018-05-02 – 2018-05-03 (×2): 40 mg via ORAL
  Filled 2018-05-02 (×2): qty 1

## 2018-05-02 MED ORDER — SODIUM CHLORIDE 0.9 % IV SOLN
1.0000 g | INTRAVENOUS | Status: DC
  Administered 2018-05-02: 1 g via INTRAVENOUS
  Filled 2018-05-02: qty 1
  Filled 2018-05-02: qty 10

## 2018-05-02 MED ORDER — ASPIRIN EC 81 MG PO TBEC
81.0000 mg | DELAYED_RELEASE_TABLET | Freq: Every day | ORAL | Status: DC
  Administered 2018-05-02 – 2018-05-03 (×2): 81 mg via ORAL
  Filled 2018-05-02 (×2): qty 1

## 2018-05-02 MED ORDER — DOCUSATE SODIUM 100 MG PO CAPS: 100.0000 mg | ORAL_CAPSULE | Freq: Two times a day (BID) | ORAL | Status: DC | PRN

## 2018-05-02 MED ORDER — MORPHINE SULFATE (PF) 2 MG/ML IV SOLN
2.0000 mg | Freq: Once | INTRAVENOUS | Status: AC
  Administered 2018-05-02: 2 mg via INTRAVENOUS
  Filled 2018-05-02: qty 1

## 2018-05-02 MED ORDER — BUSPIRONE HCL 5 MG PO TABS
10.0000 mg | ORAL_TABLET | Freq: Two times a day (BID) | ORAL | Status: DC
  Administered 2018-05-02 – 2018-05-03 (×3): 10 mg via ORAL
  Filled 2018-05-02 (×4): qty 2

## 2018-05-02 MED ORDER — HEPARIN SODIUM (PORCINE) 5000 UNIT/ML IJ SOLN
5000.0000 [IU] | Freq: Three times a day (TID) | INTRAMUSCULAR | Status: DC
  Administered 2018-05-02 – 2018-05-03 (×3): 5000 [IU] via SUBCUTANEOUS
  Filled 2018-05-02 (×3): qty 1

## 2018-05-02 MED ORDER — NITROGLYCERIN 0.4 MG SL SUBL: 0.4000 mg | SUBLINGUAL_TABLET | SUBLINGUAL | Status: DC | PRN

## 2018-05-02 MED ORDER — BUTALBITAL-APAP-CAFFEINE 50-325-40 MG PO TABS
1.0000 | ORAL_TABLET | Freq: Three times a day (TID) | ORAL | Status: DC | PRN
  Administered 2018-05-02: 1 via ORAL
  Filled 2018-05-02: qty 1

## 2018-05-02 MED ORDER — FUROSEMIDE 40 MG PO TABS
40.0000 mg | ORAL_TABLET | Freq: Every day | ORAL | Status: DC
  Administered 2018-05-02: 40 mg via ORAL
  Filled 2018-05-02 (×2): qty 1

## 2018-05-02 MED ORDER — ALPRAZOLAM 0.5 MG PO TABS
0.5000 mg | ORAL_TABLET | Freq: Two times a day (BID) | ORAL | Status: DC | PRN
  Administered 2018-05-02 – 2018-05-03 (×3): 0.5 mg via ORAL
  Filled 2018-05-02 (×3): qty 1

## 2018-05-02 MED ORDER — CARISOPRODOL 350 MG PO TABS
350.0000 mg | ORAL_TABLET | Freq: Two times a day (BID) | ORAL | Status: DC | PRN
  Administered 2018-05-02 – 2018-05-03 (×2): 350 mg via ORAL
  Filled 2018-05-02 (×3): qty 1

## 2018-05-02 MED ORDER — NITROGLYCERIN 2 % TD OINT
0.5000 [in_us] | TOPICAL_OINTMENT | TRANSDERMAL | Status: AC
  Administered 2018-05-02: 0.5 [in_us] via TOPICAL
  Filled 2018-05-02: qty 1

## 2018-05-02 MED ORDER — OXYCODONE-ACETAMINOPHEN 5-325 MG PO TABS
2.0000 | ORAL_TABLET | ORAL | Status: DC | PRN
  Administered 2018-05-02 – 2018-05-03 (×5): 2 via ORAL
  Filled 2018-05-02 (×5): qty 2

## 2018-05-02 MED ORDER — EZETIMIBE 10 MG PO TABS
10.0000 mg | ORAL_TABLET | Freq: Every day | ORAL | Status: DC
  Administered 2018-05-02 – 2018-05-03 (×2): 10 mg via ORAL
  Filled 2018-05-02 (×2): qty 1

## 2018-05-02 MED ORDER — ONDANSETRON HCL 4 MG/2ML IJ SOLN
4.0000 mg | Freq: Once | INTRAMUSCULAR | Status: AC
  Administered 2018-05-02: 4 mg via INTRAVENOUS
  Filled 2018-05-02: qty 2

## 2018-05-02 MED ORDER — VITAMIN D (ERGOCALCIFEROL) 1.25 MG (50000 UNIT) PO CAPS: 50000.0000 [IU] | ORAL_CAPSULE | ORAL | Status: DC

## 2018-05-02 MED ORDER — MORPHINE SULFATE (PF) 4 MG/ML IV SOLN
4.0000 mg | Freq: Once | INTRAVENOUS | Status: AC
  Administered 2018-05-02: 4 mg via INTRAVENOUS
  Filled 2018-05-02: qty 1

## 2018-05-02 MED ORDER — PAROXETINE HCL 20 MG PO TABS
40.0000 mg | ORAL_TABLET | Freq: Every day | ORAL | Status: DC
  Administered 2018-05-02 – 2018-05-03 (×2): 40 mg via ORAL
  Filled 2018-05-02 (×2): qty 2

## 2018-05-02 MED ORDER — POTASSIUM CHLORIDE CRYS ER 20 MEQ PO TBCR
20.0000 meq | EXTENDED_RELEASE_TABLET | Freq: Three times a day (TID) | ORAL | Status: DC
  Administered 2018-05-02 – 2018-05-03 (×3): 20 meq via ORAL
  Filled 2018-05-02 (×3): qty 1

## 2018-05-02 MED ORDER — SODIUM CHLORIDE 0.9% FLUSH
3.0000 mL | Freq: Two times a day (BID) | INTRAVENOUS | Status: DC
  Administered 2018-05-02 – 2018-05-03 (×2): 3 mL via INTRAVENOUS

## 2018-05-02 NOTE — ED Notes (Signed)
Given xanax as ordered, states she is feeling anxious-missed her daily dose of xanax at home.

## 2018-05-02 NOTE — ED Notes (Signed)
Taken to floor by Gerilyn Pilgrim, EDT with all of belongings.

## 2018-05-02 NOTE — ED Notes (Signed)
Report to Ashley, RN

## 2018-05-02 NOTE — ED Provider Notes (Signed)
[facility] Emergency Department Provider Note   ____________________________________________   First MD Initiated Contact with Patient 05/02/18 0631     (approximate)  I have reviewed the triage vital signs and the nursing notes.   HISTORY  Chief Complaint Chest Pain    HPI Staci RighterCarla M Szymczak is a 58 y.o. female with a history of treated diabetes, hypertension and hypercholesterolemia presents for evaluation of chest pain. Initial onset of pain was approximately 3-6 hours ago. The patient's chest pain is described as heaviness/pressure/tightness, is not worse with exertion and is relieved by nitroglycerin. The patient complains of nausea. The patient's chest pain is middle- or left-sided, is not well-localized, is not sharp and does radiate to the arms/jaw/neck. The patient denies diaphoresis. The patient has smoked in the past 90 days. The patient has no history of stroke, has no history of peripheral artery disease, has no relevant family history of coronary artery disease (first degree relative at less than age 60) and does not have an elevated BMI (>=30).   Patient reports this occurred while resting watching television.  This persisted throughout the evening seem to be a slightly increased pressure prompting a call 911.  She did not sleep tonight because of the discomfort in her chest.  No nausea vomiting.  Reports she just has not felt quite too well for the last few days, little bit of chills at times but no vomiting.  Slight nausea.  Reports the discomfort in the chest seems like it radiates a little towards her neck but it is unclear because she has a history of chronic neck discomfort.  No abdominal pain.  No vomiting.  No fever.  No cough.  A slight feeling of shortness of breath has accompanied the pressure feeling     Past Medical History:  Diagnosis Date  . Acute pancreatitis   . CHF (congestive heart failure) (HCC)   . Chronic pain syndrome   . COPD  (chronic obstructive pulmonary disease) (HCC)   . Depression   . Diabetes mellitus without complication (HCC)   . Headache(784.0)    migraines  . Hypercholesteremia   . Hypertension   . Hyperthyroidism   . Hypokalemia   . Migraine   . Narcotic abuse (HCC)   . RA (rheumatoid arthritis) (HCC)   Of note, in speaking with the patient, she denies a history of COPD or CHF  Patient Active Problem List   Diagnosis Date Noted  . Chest pain 08/23/2017  . Protein-calorie malnutrition, severe 12/26/2014  . 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 Loose stools   . Diarrhea   . Hypokalemia 12/24/2014  . BP (high blood pressure) 02/23/2013  . Misuse of prescription only drugs (HCC) 02/23/2013  . Anxiety 06/22/2012  . Colitis 05/11/2012  . Clinical depression 05/11/2012  . Hypercholesterolemia 05/11/2012  . Gastroduodenal ulcer 03/23/2012    Past Surgical History:  Procedure Laterality Date  . ANTERIOR CERVICAL DECOMP/DISCECTOMY FUSION N/A 11/26/2012   Procedure: ANTERIOR CERVICAL DECOMPRESSION/DISCECTOMY FUSION 2 LEVELS;  Surgeon: Reinaldo Meekerandy O Kritzer, MD;  Location: [facility];  Service: Neurosurgery;  Laterality: N/A;  ANTERIOR CERVICAL DECOMPRESSION/DISCECTOMY FUSION 2 LEVELS  . CERVICAL POLYPECTOMY    . CHOLECYSTECTOMY    . ESOPHAGOGASTRODUODENOSCOPY N/A 12/26/2014   Procedure: ESOPHAGOGASTRODUODENOSCOPY (EGD);  Surgeon: Midge Miniumarren Wohl, MD;  [facility];  Service: Endoscopy;  Laterality: N/A;  . FRACTURE SURGERY    . NECK SURGERY    . TUBAL LIGATION    . WISDOM TOOTH EXTRACTION  Prior to Admission medications   Medication Sig Start Date End Date Taking? Authorizing Provider  ALPRAZolam Prudy Feeler) 0.5 MG tablet Take 0.5 mg by mouth 2 (two) times daily. Panic attacks    [provider]  aspirin EC 81 MG EC tablet Take 1 tablet (81 mg total) by mouth daily. 08/25/17   Enedina Finner, MD  busPIRone (BUSPAR) 5 MG tablet Take 5 mg by mouth 2 (two) times daily. 12/11/14    [provider]  butalbital-acetaminophen-caffeine (FIORICET, ESGIC) 50-325-40 MG per tablet Take 1 tablet by mouth as needed. Headaches    [provider]  carisoprodol (SOMA) 350 MG tablet Take 350 mg by mouth 2 (two) times daily as needed. For muscle relaxation. 12/11/14   [provider]  diphenoxylate-atropine (LOMOTIL) 2.5-0.025 MG tablet Take 1 tablet by mouth 3 (three) times daily. 01/15/15   [provider]  furosemide (LASIX) 40 MG tablet Take 40 mg by mouth daily.    [provider]  HYDROcodone-acetaminophen (NORCO) 5-325 MG tablet Take 1 tablet by mouth every 6 (six) hours as needed for up to 7 doses for severe pain. 12/18/17   Merrily Brittle, MD  oxyCODONE-acetaminophen (PERCOCET/ROXICET) 5-325 MG tablet Take 2 tablets by mouth every 4 (four) hours as needed for severe pain. 10/13/17   Rebecka Apley, MD  PARoxetine (PAXIL) 40 MG tablet Take 40 mg by mouth daily.    [provider]  potassium chloride SA (K-DUR,KLOR-CON) 20 MEQ tablet Take 20 mEq by mouth 3 (three) times daily.     [provider]    Allergies Almond oil and Statins  Family History  Problem Relation Age of Onset  . CAD Father   . Alzheimer's disease Unknown     Social History Social History   Tobacco Use  . Smoking status: Former Smoker    Packs/day: 0.50    Years: 38.00    Pack years: 19.00    Types: Cigarettes    Last attempt to quit: 11/14/2012    Years since quitting: 5.4  . Smokeless tobacco: Never Used  Substance Use Topics  . Alcohol use: Not Currently    Comment: occ  . Drug use: No    Review of Systems Constitutional: No fever but chills and some some fatigue last few days Eyes: No visual changes. ENT: No sore throat. Cardiovascular: Denies chest pain reports a pressure feeling. Respiratory: Denies shortness of breath for just a very light feeling of shortness of breath. Gastrointestinal: No abdominal pain.    Genitourinary: Negative for dysuria. Musculoskeletal: Negative for back pain.  Denies leg swelling. Skin: Negative for rash. Neurological: Negative for headaches, areas of focal weakness or numbness. No history of any blood clots.  Pain is not worsened with inspiration.  There is no sharp pain.  No unilateral leg swelling.  No recent trips travel or surgeries.  No contact with anyone known to have coronavirus  Pain is currently mild to moderate  ____________________________________________   PHYSICAL EXAM:  VITAL SIGNS: ED Triage Vitals  Enc Vitals Group     BP 05/02/18 0616 (!) 156/95     Pulse Rate 05/02/18 0616 60     Resp 05/02/18 0616 18     Temp 05/02/18 0616 98.6 F (37 C)     Temp Source 05/02/18 0616 Oral     SpO2 05/02/18 0616 99 %     Weight 05/02/18 0616 145 lb (65.8 kg)     Height 05/02/18 0616 5\' 4"  (1.626 m)  Head Circumference --      Peak Flow --      Pain Score 05/02/18 0615 7     Pain Loc --      Pain Edu? --      Excl. in GC? --     Constitutional: Alert and oriented. Well appearing and in no acute distress.  She is very pleasant. Eyes: Conjunctivae are normal. Head: Atraumatic. Nose: No congestion/rhinnorhea. Mouth/Throat: Mucous membranes are moist. Neck: No stridor.  Cardiovascular: Normal rate, regular rhythm. Grossly normal heart sounds.  Good peripheral circulation.  No reproducible chest discomfort. Respiratory: Normal respiratory effort.  No retractions. Lungs CTAB.  Speaks in full and clear sentences.  Normal oxygen saturation on room air. Gastrointestinal: Soft and nontender. No distention. Musculoskeletal: No lower extremity tenderness nor edema. Neurologic:  Normal speech and language. No gross focal neurologic deficits are appreciated.  Skin:  Skin is warm, dry and intact. No rash noted. Psychiatric: Mood and affect are normal. Speech and behavior are normal.  ____________________________________________   LABS (all labs ordered  are listed, but only abnormal results are displayed)  Labs Reviewed  CBC - Abnormal; Notable for the following components:      Result Value   Hemoglobin 11.8 (*)    All other components within normal limits  APTT  COMPREHENSIVE METABOLIC PANEL  LIPASE, BLOOD  TROPONIN I  URINALYSIS, COMPLETE (UACMP) WITH MICROSCOPIC   ____________________________________________  EKG  Reviewed entered by me at 6:22 AM Heart rate 60 QRS 90 QTc 440 Normal sinus rhythm, just a slight baseline wander.  There is no evidence of acute ischemia or T wave abnormality denoted.  Of note the patient also reports her pain is slightly better now after the nitroglycerin given by EMS ____________________________________________  RADIOLOGY  Dg Chest Portable 1 View  Result Date: 05/02/2018 CLINICAL DATA:  Left-sided chest pain EXAM: PORTABLE CHEST 1 VIEW COMPARISON:  08/23/2017 FINDINGS: Lungs are clear.  No pleural effusion or pneumothorax. The heart is normal in size. Cervical spine fixation hardware, incompletely visualized. IMPRESSION: No evidence of acute cardiopulmonary disease. Electronically Signed   By: Charline Bills M.D.   On: 05/02/2018 06:56    Chest x-ray reviewed negative for acute ____________________________________________   PROCEDURES  Procedure(s) performed: None  Procedures  Critical Care performed: No  ____________________________________________   INITIAL IMPRESSION / ASSESSMENT AND PLAN / ED COURSE  Pertinent labs & imaging results that were available during my care of the patient were reviewed by me and considered in my medical decision making (see chart for details).   Differential diagnosis includes, but is not limited to, ACS, aortic dissection, pulmonary embolism, cardiac tamponade, pneumothorax, pneumonia, pericarditis, myocarditis, GI-related causes including esophagitis/gastritis, and musculoskeletal chest wall pain.    The patient evaluation history is most  concerning for acute coronary syndrome.  Does not have any evidence or history suggest dissection, pulmonary embolism, pneumothorax, etc.  No intra-abdominal symptoms.  Negative Murphy.  Concerned the patient may have symptoms starting to represent unstable angina, her EKG does appear nonischemic and she is responded well.  She got 324 mg aspirin by EMS, nitroglycerin and morphine here and she reports her pain is almost fully relieved at this time at 7:30 AM  Clinical Course as of May 01 744  Sun May 02, 2018  0646 Heart score = 5 prior to troponin result   [MQ]  0648 Reevaluation, patient reports the chest pressure is mild.  Morphine and nitroglycerin have helped some.  She is agreeable  to receiving another small dose of morphine.  She is fully awake and alert at this time with x-ray being performed   [MQ]    Clinical Course User Index [MQ] Sharyn CreamerQuale, Ree Alcalde, MD    Ongoing care signed Dr. Mayford KnifeWilliams, follow-up on troponin and BMP.  Patient is in agreement and understanding of plan for admission for further work-up regarding her chest pain. ____________________________________________   FINAL CLINICAL IMPRESSION(S) / ED DIAGNOSES  Final diagnoses:  Moderate risk chest pain        Note:  This document was prepared using Dragon voice recognition software and may include unintentional dictation errors       Sharyn CreamerQuale, Abdiel Blackerby, MD 05/02/18 [phone]

## 2018-05-02 NOTE — ED Notes (Addendum)
ED TO INPATIENT HANDOFF REPORT  ED Nurse Name and Phone #: [phone]  S Name/Age/Gender Whitney Bernard M Whitney Bernard 58 y.o. female Room/Bed: ED02A/ED02A  Code Status   Code Status: Prior  Home/SNF/Other Home Patient oriented to: self, place, time and situation Is this baseline? Yes   Triage Complete: Triage complete  Chief Complaint Chest pain  Triage Note EMS pt to RM 2 from home with report of left side chest pain with no radiation. Started at 9pm. Unable to sleep. ASA 324, 1 sl NTG no relief. 20g IV to left AC.    Allergies Allergies  Allergen Reactions  . Almond Oil Hives  . Statins Other (See Comments)    Multiple statins    Level of Care/Admitting Diagnosis ED Disposition    ED Disposition Condition Comment   Admit  [facility]ospital [facility] [[id number]]  Level of Care: Telemetry [5]  Diagnosis: Chest pain [409811][[id number]]  Admitting Physician: Altamese DillingVACHHANI, VAIBHAVKUMAR [[id number]]  Attending Physician: Altamese DillingVACHHANI, VAIBHAVKUMAR [phone][[id number]]  PT Class (Do Not Modify): Observation [104]  PT Acc Code (Do Not Modify): Observation [10022]       B Medical/Surgery History Past Medical History:  Diagnosis Date  . Acute pancreatitis   . CHF (congestive heart failure) (HCC)   . Chronic pain syndrome   . COPD (chronic obstructive pulmonary disease) (HCC)   . Depression   . Diabetes mellitus without complication (HCC)   . Headache(784.0)    migraines  . Hypercholesteremia   . Hypertension   . Hyperthyroidism   . Hypokalemia   . Migraine   . Narcotic abuse (HCC)   . RA (rheumatoid arthritis) (HCC)    Past Surgical History:  Procedure Laterality Date  . ANTERIOR CERVICAL DECOMP/DISCECTOMY FUSION N/A 11/26/2012   Procedure: ANTERIOR CERVICAL DECOMPRESSION/DISCECTOMY FUSION 2 LEVELS;  Surgeon: Reinaldo Meekerandy O Kritzer, MD;  Location: [facility];  Service: Neurosurgery;  Laterality: N/A;  ANTERIOR CERVICAL DECOMPRESSION/DISCECTOMY FUSION 2 LEVELS  . CERVICAL POLYPECTOMY    .  CHOLECYSTECTOMY    . ESOPHAGOGASTRODUODENOSCOPY N/A 12/26/2014   Procedure: ESOPHAGOGASTRODUODENOSCOPY (EGD);  Surgeon: Midge Miniumarren Wohl, MD;  [facility];  Service: Endoscopy;  Laterality: N/A;  . FRACTURE SURGERY    . NECK SURGERY    . TUBAL LIGATION    . WISDOM TOOTH EXTRACTION       A IV Location/Drains/Wounds Patient Lines/Drains/Airways Status   Active Line/Drains/Airways    Name:   Placement date:   Placement time:   Site:   Days:   Peripheral IV 05/02/18 Right Hand   05/02/18    0829    Hand   less than 1          Intake/Output Last 24 hours No intake or output data in the 24 hours ending 05/02/18 1013  Labs/Imaging Results for orders placed or performed during the hospital encounter of 05/02/18 (from the past 48 hour(s))  CBC     Status: Abnormal   Collection Time: 05/02/18  6:20 AM  Result Value Ref Range   WBC 10.1 4.0 - 10.5 K/uL   RBC 4.26 3.87 - 5.11 MIL/uL   Hemoglobin 11.8 (L) 12.0 - 15.0 g/dL   HCT 65.738.3 84.636.0 - 96.246.0 %   MCV 89.9 80.0 - 100.0 fL   MCH 27.7 26.0 - 34.0 pg   MCHC 30.8 30.0 - 36.0 g/dL   RDW 95.214.3 84.111.5 - 32.415.5 %   Platelets 389 150 - 400 K/uL   nRBC 0.0 0.0 - 0.2 %    Comment:  Performed at [facility], [address] [place], [place] [address]  Comprehensive metabolic panel     Status: Abnormal   Collection Time: 05/02/18  6:20 AM  Result Value Ref Range   Sodium 140 135 - 145 mmol/L   Potassium 4.0 3.5 - 5.1 mmol/L   Chloride 110 98 - 111 mmol/L   CO2 20 (L) 22 - 32 mmol/L   Glucose, Bld 100 (H) 70 - 99 mg/dL   BUN 21 (H) 6 - 20 mg/dL   Creatinine, Ser 4.091.39 (H) 0.44 - 1.00 mg/dL   Calcium 9.3 8.9 - 81.110.3 mg/dL   Total Protein 7.5 6.5 - 8.1 g/dL   Albumin 3.6 3.5 - 5.0 g/dL   AST 18 15 - 41 U/L   ALT 16 0 - 44 U/L   Alkaline Phosphatase 102 38 - 126 U/L   Total Bilirubin 0.2 (L) 0.3 - 1.2 mg/dL   GFR calc non Af Amer 41 (L) >60 mL/min   GFR calc Af Amer 48 (L) >60 mL/min   Anion gap 10 5 - 15    Comment: Performed  at [facility], [address] [place], [place] [address]  Lipase, blood     Status: None   Collection Time: 05/02/18  6:20 AM  Result Value Ref Range   Lipase 26 11 - 51 U/L    Comment: Performed at [facility], [address] [place], [place] [address]  Troponin I - ONCE - STAT     Status: None   Collection Time: 05/02/18  6:20 AM  Result Value Ref Range   Troponin I <0.03 <0.03 ng/mL    Comment: Performed at [facility], [address] [place], [place] [address]  APTT     Status: None   Collection Time: 05/02/18  6:20 AM  Result Value Ref Range   aPTT 26 24 - 36 seconds    Comment: Performed at [facility], [address] [place], [place] [address]  Urinalysis, Complete w Microscopic     Status: Abnormal   Collection Time: 05/02/18  7:51 AM  Result Value Ref Range   Color, Urine YELLOW (A) YELLOW   APPearance CLEAR (A) CLEAR   Specific Gravity, Urine 1.023 1.005 - 1.030   pH 5.0 5.0 - 8.0   Glucose, UA NEGATIVE NEGATIVE mg/dL   Hgb urine dipstick NEGATIVE NEGATIVE   Bilirubin Urine NEGATIVE NEGATIVE   Ketones, ur NEGATIVE NEGATIVE mg/dL   Protein, ur 30 (A) NEGATIVE mg/dL   Nitrite POSITIVE (A) NEGATIVE   Leukocytes,Ua TRACE (A) NEGATIVE   RBC / HPF 0-5 0 - 5 RBC/hpf   WBC, UA 6-10 0 - 5 WBC/hpf   Bacteria, UA RARE (A) NONE SEEN   Squamous Epithelial / LPF 0-5 0 - 5   Mucus PRESENT    Hyaline Casts, UA PRESENT     Comment: Performed at [facility], [address] [place], [place] [address]   Dg Chest Portable 1 View  Result Date: 05/02/2018 CLINICAL DATA:  Left-sided chest pain EXAM: PORTABLE CHEST 1 VIEW COMPARISON:  08/23/2017 FINDINGS: Lungs are clear.  No pleural effusion or pneumothorax. The heart is normal in size. Cervical spine fixation hardware, incompletely visualized. IMPRESSION: No evidence of acute cardiopulmonary disease. Electronically Signed   By: Charline BillsSriyesh  Krishnan M.D.   On: 05/02/2018 06:56     Pending Labs Unresulted Labs (From admission, onward)    Start     Ordered   05/02/18 1007  Hemoglobin A1c  Add-on,   AD     05/02/18 1006   05/02/18 1007  Lipid panel  Add-on,   AD     05/02/18 1006   05/02/18 1007  Troponin I - Now Then Q6H  Now then every 6 hours,   STAT     05/02/18 1006   Signed and Held  Adult nurse  Tomorrow morning,   R     Signed and Held   Signed and Held  CBC  Tomorrow morning,   R     Signed and Held   Signed and Held  CBC  (heparin)  Once,   R    Comments:  Baseline for heparin therapy IF NOT ALREADY DRAWN.  Notify MD if PLT < 100 K.    Signed and Held   Signed and Held  Creatinine, serum  (heparin)  Once,   R    Comments:  Baseline for heparin therapy IF NOT ALREADY DRAWN.    Signed and Held          Vitals/Pain Today's Vitals   05/02/18 0820 05/02/18 0830 05/02/18 0900 05/02/18 0930  BP:  (!) 145/88 (!) 141/85 136/80  Pulse:  69 64 64  Resp:  16 16 17   Temp:      TempSrc:      SpO2:  97% 97% 96%  Weight:      Height:      PainSc: 7        Isolation Precautions No active isolations  Medications Medications  nitroGLYCERIN (NITROSTAT) SL tablet 0.4 mg (has no administration in time range)  morphine 2 MG/ML injection 2 mg (2 mg Intravenous Given 05/02/18 0644)  nitroGLYCERIN (NITROGLYN) 2 % ointment 0.5 inch (0.5 inches Topical Given 05/02/18 0645)  ondansetron (ZOFRAN) injection 4 mg (4 mg Intravenous Given 05/02/18 0644)  morphine 2 MG/ML injection 2 mg (2 mg Intravenous Given 05/02/18 0653)  morphine 4 MG/ML injection 4 mg (4 mg Intravenous Given 05/02/18 9381)    Mobility walks Low fall risk   Focused Assessments Cardiac Assessment Handoff:    Lab Results  Component Value Date   CKTOTAL 26 02/26/2012   CKMB 0.6 02/26/2012   TROPONINI <0.03 05/02/2018   No results found for: DDIMER Does the Patient currently have chest pain? No     R Recommendations: See Admitting Provider Note  Report given to:  Morrie Sheldon  Additional Notes: Ambulatory

## 2018-05-02 NOTE — Plan of Care (Signed)
  Problem: Pain Managment: Goal: General experience of comfort will improve Outcome: Not Progressing Note:  Patient continues to ask for pain medicine as scheduled

## 2018-05-02 NOTE — ED Triage Notes (Signed)
EMS pt to RM 2 from home with report of left side chest pain with no radiation. Started at 9pm. Unable to sleep. ASA 324, 1 sl NTG no relief. 20g IV to left AC.

## 2018-05-02 NOTE — ED Notes (Signed)
ED TO INPATIENT HANDOFF REPORT  ED Nurse Name and Phone #: ally [phone]  S Name/Age/Gender Whitney Bernard 58 y.o. female Room/Bed: ED02A/ED02A  Code Status   Code Status: Prior  Home/SNF/Other Home Patient oriented to: self, place, time and situation Is this baseline? Yes   Triage Complete: Triage complete  Chief Complaint Chest pain  Triage Note EMS pt to RM 2 from home with report of left side chest pain with no radiation. Started at 9pm. Unable to sleep. ASA 324, 1 sl NTG no relief. 20g IV to left AC.    Allergies Allergies  Allergen Reactions  . Almond Oil Hives  . Statins Other (See Comments)    Multiple statins    Level of Care/Admitting Diagnosis ED Disposition    ED Disposition Condition Comment   Admit  Hospital Area[facility] [[id number]]  Level of Care: Telemetry [5]  Diagnosis: Chest pain [[id number]][[id number]]  Admitting Physician: Altamese DillingVACHHANI, VAIBHAVKUMAR [[id number]]  Attending Physician: Altamese DillingVACHHANI, VAIBHAVKUMAR [phone][[id number]]  PT Class (Do Not Modify): Observation [104]  PT Acc Code (Do Not Modify): Observation [10022]       B Medical/Surgery History Past Medical History:  Diagnosis Date  . Acute pancreatitis   . Chronic pain syndrome   . COPD (chronic obstructive pulmonary disease) (HCC)   . Depression   . Diabetes mellitus without complication (HCC)   . Headache(784.0)    migraines  . Hypercholesteremia   . Hypertension   . Hyperthyroidism   . Hypokalemia   . Migraine   . Narcotic abuse (HCC)   . RA (rheumatoid arthritis) (HCC)    Past Surgical History:  Procedure Laterality Date  . ANTERIOR CERVICAL DECOMP/DISCECTOMY FUSION N/A 11/26/2012   Procedure: ANTERIOR CERVICAL DECOMPRESSION/DISCECTOMY FUSION 2 LEVELS;  Surgeon: Reinaldo Meekerandy O Kritzer, MD;  Location: [facility];  Service: Neurosurgery;  Laterality: N/A;  ANTERIOR CERVICAL DECOMPRESSION/DISCECTOMY FUSION 2 LEVELS  . CERVICAL POLYPECTOMY    . CHOLECYSTECTOMY    .  ESOPHAGOGASTRODUODENOSCOPY N/A 12/26/2014   Procedure: ESOPHAGOGASTRODUODENOSCOPY (EGD);  Surgeon: Midge Miniumarren Wohl, MD;  [facility];  Service: Endoscopy;  Laterality: N/A;  . FRACTURE SURGERY    . NECK SURGERY    . TUBAL LIGATION    . WISDOM TOOTH EXTRACTION       A IV Location/Drains/Wounds Patient Lines/Drains/Airways Status   Active Line/Drains/Airways    Name:   Placement date:   Placement time:   Site:   Days:   Peripheral IV 05/02/18 Right Hand   05/02/18    0829    Hand   less than 1          Intake/Output Last 24 hours No intake or output data in the 24 hours ending 05/02/18 1038  Labs/Imaging Results for orders placed or performed during the hospital encounter of 05/02/18 (from the past 48 hour(s))  CBC     Status: Abnormal   Collection Time: 05/02/18  6:20 AM  Result Value Ref Range   WBC 10.1 4.0 - 10.5 K/uL   RBC 4.26 3.87 - 5.11 MIL/uL   Hemoglobin 11.8 (L) 12.0 - 15.0 g/dL   HCT 29.538.3 62.136.0 - 30.846.0 %   MCV 89.9 80.0 - 100.0 fL   MCH 27.7 26.0 - 34.0 pg   MCHC 30.8 30.0 - 36.0 g/dL   RDW 65.714.3 84.611.5 - 96.215.5 %   Platelets 389 150 - 400 K/uL   nRBC 0.0 0.0 - 0.2 %    Comment: Performed at [facility], [address] [place], [place] [address]  Comprehensive metabolic panel     Status: Abnormal   Collection Time: 05/02/18  6:20 AM  Result Value Ref Range   Sodium 140 135 - 145 mmol/L   Potassium 4.0 3.5 - 5.1 mmol/L   Chloride 110 98 - 111 mmol/L   CO2 20 (L) 22 - 32 mmol/L   Glucose, Bld 100 (H) 70 - 99 mg/dL   BUN 21 (H) 6 - 20 mg/dL   Creatinine, Ser 5.99 (H) 0.44 - 1.00 mg/dL   Calcium 9.3 8.9 - 35.7 mg/dL   Total Protein 7.5 6.5 - 8.1 g/dL   Albumin 3.6 3.5 - 5.0 g/dL   AST 18 15 - 41 U/L   ALT 16 0 - 44 U/L   Alkaline Phosphatase 102 38 - 126 U/L   Total Bilirubin 0.2 (L) 0.3 - 1.2 mg/dL   GFR calc non Af Amer 41 (L) >60 mL/min   GFR calc Af Amer 48 (L) >60 mL/min   Anion gap 10 5 - 15    Comment: Performed at [facility], [address] [place], [place] [address]  Lipase, blood     Status: None   Collection Time: 05/02/18  6:20 AM  Result Value Ref Range   Lipase 26 11 - 51 U/L    Comment: Performed at [facility], [address] [place], [place] [address]  Troponin I - ONCE - STAT     Status: None   Collection Time: 05/02/18  6:20 AM  Result Value Ref Range   Troponin I <0.03 <0.03 ng/mL    Comment: Performed at [facility], [address] [place], [place] [address]  APTT     Status: None   Collection Time: 05/02/18  6:20 AM  Result Value Ref Range   aPTT 26 24 - 36 seconds    Comment: Performed at [facility], [address] [place], [place] [address]  Urinalysis, Complete w Microscopic     Status: Abnormal   Collection Time: 05/02/18  7:51 AM  Result Value Ref Range   Color, Urine YELLOW (A) YELLOW   APPearance CLEAR (A) CLEAR   Specific Gravity, Urine 1.023 1.005 - 1.030   pH 5.0 5.0 - 8.0   Glucose, UA NEGATIVE NEGATIVE mg/dL   Hgb urine dipstick NEGATIVE NEGATIVE   Bilirubin Urine NEGATIVE NEGATIVE   Ketones, ur NEGATIVE NEGATIVE mg/dL   Protein, ur 30 (A) NEGATIVE mg/dL   Nitrite POSITIVE (A) NEGATIVE   Leukocytes,Ua TRACE (A) NEGATIVE   RBC / HPF 0-5 0 - 5 RBC/hpf   WBC, UA 6-10 0 - 5 WBC/hpf   Bacteria, UA RARE (A) NONE SEEN   Squamous Epithelial / LPF 0-5 0 - 5   Mucus PRESENT    Hyaline Casts, UA PRESENT     Comment: Performed at [facility], [address], [place], [place] [address]   Dg Chest Portable 1 View  Result Date: 05/02/2018 CLINICAL DATA:  Left-sided chest pain EXAM: PORTABLE CHEST 1 VIEW COMPARISON:  08/23/2017 FINDINGS: Lungs are clear.  No pleural effusion or pneumothorax. The heart is normal in size. Cervical spine fixation hardware, incompletely visualized. IMPRESSION: No evidence of acute cardiopulmonary disease. Electronically Signed   By: Charline Bills M.D.   On: 05/02/2018 06:56    Pending  Labs Unresulted Labs (From admission, onward)    Start     Ordered   05/02/18 1022  Urine Culture  Add-on,   AD  05/02/18 1022   05/02/18 1007  Hemoglobin A1c  Add-on,   AD     05/02/18 1006   05/02/18 1007  Lipid panel  Add-on,   AD     05/02/18 1006   05/02/18 1007  Troponin I - Now Then Q6H  Now then every 6 hours,   STAT     05/02/18 1006   Signed and Held  Adult nurse  Tomorrow morning,   R     Signed and Held   Signed and Held  CBC  Tomorrow morning,   R     Signed and Held   Signed and Held  CBC  (heparin)  Once,   R    Comments:  Baseline for heparin therapy IF NOT ALREADY DRAWN.  Notify MD if PLT < 100 K.    Signed and Held   Signed and Held  Creatinine, serum  (heparin)  Once,   R    Comments:  Baseline for heparin therapy IF NOT ALREADY DRAWN.    Signed and Held          Vitals/Pain Today's Vitals   05/02/18 0900 05/02/18 0930 05/02/18 1023 05/02/18 1030  BP: (!) 141/85 136/80  137/88  Pulse: 64 64  64  Resp: 16 17  17   Temp:      TempSrc:      SpO2: 97% 96%  98%  Weight:      Height:      PainSc:   2      Isolation Precautions No active isolations  Medications Medications  ALPRAZolam (XANAX) tablet 0.5 mg (0.5 mg Oral Given 05/02/18 1018)  nitroGLYCERIN (NITROSTAT) SL tablet 0.4 mg (has no administration in time range)  cefTRIAXone (ROCEPHIN) 1 g in sodium chloride 0.9 % 100 mL IVPB (has no administration in time range)  morphine 2 MG/ML injection 2 mg (2 mg Intravenous Given 05/02/18 0644)  nitroGLYCERIN (NITROGLYN) 2 % ointment 0.5 inch (0.5 inches Topical Given 05/02/18 0645)  ondansetron (ZOFRAN) injection 4 mg (4 mg Intravenous Given 05/02/18 0644)  morphine 2 MG/ML injection 2 mg (2 mg Intravenous Given 05/02/18 0653)  morphine 4 MG/ML injection 4 mg (4 mg Intravenous Given 05/02/18 9977)    Mobility walks Low fall risk   Focused Assessments Cardiac Assessment Handoff:    Lab Results  Component Value Date   CKTOTAL 26  02/26/2012   CKMB 0.6 02/26/2012   TROPONINI <0.03 05/02/2018   No results found for: DDIMER Does the Patient currently have chest pain? No     R Recommendations: See Admitting Provider Note  Report given to:   Additional Notes: Morrie Sheldon

## 2018-05-02 NOTE — Consult Note (Addendum)
Cardiology Consultation:   Patient ID: Whitney Bernard; [id number]; 1961-02-16   Admit date: 05/02/2018 Date of Consult: 05/02/2018  Primary Care Provider: Evelene CroonNiemeyer, Meindert, MD Primary Cardiologist: [facility] - consult by Shirlee LatchMcLean   Patient Profile:   Whitney Bernard is a 58 y.o. female with a hx of diabetes, hypertension, hyperlipidemia, hypothyroidism, chronic pain disorder with narcotic abuse, CKD stage II-III, rheumatoid arthritis, migraine disorder, COPD secondary to prior tobacco abuse quitting several months prior, colitis, diverticulitis and depression who is being seen today for the evaluation of chest pain at the request of Dr. Elisabeth PigeonVachhani.  History of Present Illness:   Whitney Bernard has no previously known cardiac history.  She was admitted to the hospital in 08/2017 with chest pain and ruled out.  Myoview showed no significant ischemia with an EF of 55 to 65%.  This was a low risk study.  Her chest pain was felt to be noncardiac.  Patient was in her usual state of health watching TV on the evening of 05/01/2018.  Around 9 PM she developed left-sided chest pressure with associated shortness of breath that was worse with deep inspiration.  At its worst, pressure was a 7 out of 10.  Pressure kept the patient up all night.  There was no associated nausea, vomiting, diaphoresis, dizziness, presyncope, syncope, or palpitations.  No recent prolonged sedentary periods.  Pain has persisted into 3/15 and currently continues to note a 5 out of 10 chest pain.  Pain feels similar to her episode in 08/2017 in which it was felt to be noncardiac.  Upon the patient's arrival to [facility] they were found to have BP 130s to 160s systolic, HR 60s bpm, temp afebrile, oxygen saturation 99 % on room air, weight 65.8 kg. EKG was nonacute as below, CXR showed no acute cardiopulmonary disease. Labs showed troponin negative x1, lipase 26, potassium 4.0, serum creatinine 1.39, AST/ALT normal, WBC 10.1, Hgb 11.8.  In the ED, she  has received 8 mg of IV morphine, Zofran,and placement of Nitropaste.  Currently, continues to note 5 out of 10 chest pain despite multiple doses of IV morphine and recent Xanax.  Past Medical History:  Diagnosis Date  . Acute pancreatitis   . Chronic pain syndrome   . COPD (chronic obstructive pulmonary disease) (HCC)   . Depression   . Diabetes mellitus without complication (HCC)   . Headache(784.0)    migraines  . Hypercholesteremia   . Hypertension   . Hyperthyroidism   . Hypokalemia   . Migraine   . Narcotic abuse (HCC)   . RA (rheumatoid arthritis) (HCC)     Past Surgical History:  Procedure Laterality Date  . ANTERIOR CERVICAL DECOMP/DISCECTOMY FUSION N/A 11/26/2012   Procedure: ANTERIOR CERVICAL DECOMPRESSION/DISCECTOMY FUSION 2 LEVELS;  Surgeon: Reinaldo Meekerandy O Kritzer, MD;  Location: [facility];  Service: Neurosurgery;  Laterality: N/A;  ANTERIOR CERVICAL DECOMPRESSION/DISCECTOMY FUSION 2 LEVELS  . CERVICAL POLYPECTOMY    . CHOLECYSTECTOMY    . ESOPHAGOGASTRODUODENOSCOPY N/A 12/26/2014   Procedure: ESOPHAGOGASTRODUODENOSCOPY (EGD);  Surgeon: Midge Miniumarren Wohl, MD;  [facility];  Service: Endoscopy;  Laterality: N/A;  . FRACTURE SURGERY    . NECK SURGERY    . TUBAL LIGATION    . WISDOM TOOTH EXTRACTION       Home Meds: Prior to Admission medications   Medication Sig Start Date End Date Taking? Authorizing Provider  ALPRAZolam Prudy Feeler(XANAX) 0.5 MG tablet Take 0.5 mg by mouth 2 (two) times daily as needed  for anxiety.    Yes [provider]  busPIRone (BUSPAR) 10 MG tablet Take 10 mg by mouth 2 (two) times daily.  04/26/18  Yes [provider]  Butalbital-APAP-Caffeine 50-300-40 MG CAPS Take 1 capsule by mouth 3 (three) times daily as needed for headache. 04/26/18  Yes [provider]  carisoprodol (SOMA) 350 MG tablet Take 350 mg by mouth 2 (two) times daily as needed for muscle spasms.    Yes [provider]  D3-50 1.25 MG (50000 UT) capsule  Take 50,000 Units by mouth every Tuesday. 01/26/18  Yes [provider]  ezetimibe (ZETIA) 10 MG tablet Take 10 mg by mouth daily. 01/26/18  Yes [provider]  furosemide (LASIX) 40 MG tablet Take 40 mg by mouth daily.   Yes [provider]  PARoxetine (PAXIL) 40 MG tablet Take 40 mg by mouth daily.   Yes [provider]  potassium chloride SA (K-DUR,KLOR-CON) 20 MEQ tablet Take 20 mEq by mouth 3 (three) times daily.    Yes [provider]  aspirin EC 81 MG EC tablet Take 1 tablet (81 mg total) by mouth daily. Patient not taking: Reported on 05/02/2018 08/25/17   Enedina Finner, MD  HYDROcodone-acetaminophen [facility]) 5-325 MG tablet Take 1 tablet by mouth every 6 (six) hours as needed for up to 7 doses for severe pain. Patient not taking: Reported on 05/02/2018 12/18/17   Merrily Brittle, MD  oxyCODONE-acetaminophen (PERCOCET/ROXICET) 5-325 MG tablet Take 2 tablets by mouth every 4 (four) hours as needed for severe pain. Patient not taking: Reported on 05/02/2018 10/13/17   Rebecka Apley, MD    Inpatient Medications: Scheduled Meds:  Continuous Infusions:  PRN Meds: ALPRAZolam, nitroGLYCERIN  Allergies:   Allergies  Allergen Reactions  . Almond Oil Hives  . Statins Other (See Comments)    Multiple statins    Social History:   Social History   Socioeconomic History  . Marital status: Married    Spouse name: Not on file  . Number of children: Not on file  . Years of education: Not on file  . Highest education level: Not on file  Occupational History  . Not on file  Social Needs  . Financial resource strain: Not on file  . Food insecurity:    Worry: Not on file    Inability: Not on file  . Transportation needs:    Medical: Not on file    Non-medical: Not on file  Tobacco Use  . Smoking status: Former Smoker    Packs/day: 0.50    Years: 38.00    Pack years: 19.00    Types: Cigarettes    Last attempt to quit: 11/14/2012     Years since quitting: 5.4  . Smokeless tobacco: Never Used  Substance and Sexual Activity  . Alcohol use: Not Currently    Comment: occ  . Drug use: No  . Sexual activity: Yes    Birth control/protection: Surgical  Lifestyle  . Physical activity:    Days per week: Not on file    Minutes per session: Not on file  . Stress: Not on file  Relationships  . Social connections:    Talks on phone: Not on file    Gets together: Not on file    Attends religious service: Not on file    Active member of club or organization: Not on file    Attends meetings of clubs or organizations: Not on file    Relationship status: Not on  file  . Intimate partner violence:    Fear of current or ex partner: Not on file    Emotionally abused: Not on file    Physically abused: Not on file    Forced sexual activity: Not on file  Other Topics Concern  . Not on file  Social History Narrative  . Not on file     Family History:   Family History  Problem Relation Age of Onset  . CAD Father   . Alzheimer's disease Unknown     ROS:  Review of Systems  Constitutional: Positive for malaise/fatigue. Negative for chills, diaphoresis, fever and weight loss.  HENT: Negative for congestion.   Eyes: Negative for discharge and redness.  Respiratory: Positive for shortness of breath. Negative for cough, hemoptysis, sputum production and wheezing.   Cardiovascular: Positive for chest pain. Negative for palpitations, orthopnea, claudication, leg swelling and PND.  Gastrointestinal: Negative for abdominal pain, blood in stool, heartburn, melena, nausea and vomiting.  Genitourinary: Negative for hematuria.  Musculoskeletal: Positive for back pain, joint pain, myalgias and neck pain. Negative for falls.  Skin: Negative for rash.  Neurological: Positive for weakness. Negative for dizziness, tingling, tremors, sensory change, speech change, focal weakness and loss of consciousness.  Endo/Heme/Allergies: Does not  bruise/bleed easily.  Psychiatric/Behavioral: Negative for substance abuse. The patient is not nervous/anxious.   All other systems reviewed and are negative.     Physical Exam/Data:   Vitals:   05/02/18 0800 05/02/18 0830 05/02/18 0900 05/02/18 0930  BP: 136/84 (!) 145/88 (!) 141/85 136/80  Pulse: 61 69 64 64  Resp: 18 16 16 17   Temp:      TempSrc:      SpO2: 98% 97% 97% 96%  Weight:      Height:       No intake or output data in the 24 hours ending 05/02/18 1020 Filed Weights   05/02/18 0616  Weight: 65.8 kg   Body mass index is 24.89 kg/m.   Physical Exam: General: Well developed, well nourished, in no acute distress. Head: Normocephalic, atraumatic, sclera non-icteric, no xanthomas, nares without discharge.  Neck: Negative for carotid bruits. JVD not elevated. Lungs: Clear bilaterally to auscultation without wheezes, rales, or rhonchi. Breathing is unlabored. Heart: RRR with S1 S2. No murmurs, rubs, or gallops appreciated.  Palpation of left upper anterior chest wall fully reproduces the patient's symptoms. Abdomen: Soft, non-tender, non-distended with normoactive bowel sounds. No hepatomegaly. No rebound/guarding. No obvious abdominal masses. Msk:  Strength and tone appear normal for age. Extremities: No clubbing or cyanosis. No edema. Distal pedal pulses are 2+ and equal bilaterally. Neuro: Alert and oriented X 3. No facial asymmetry. No focal deficit. Moves all extremities spontaneously. Psych:  Responds to questions appropriately with a normal affect.   EKG:  The EKG was personally reviewed and demonstrates: NSR, 61 bpm, baseline wandering and artifact, no acute st/t changes Telemetry:  Telemetry was personally reviewed and demonstrates: Sinus rhythm  Weights: Filed Weights   05/02/18 0616  Weight: 65.8 kg    Relevant CV Studies: Myoview 08/24/2017:  This is a low risk study.  The study is normal.  The left ventricular ejection fraction is normal (55-65%).   There was no ST segment deviation noted during stress.   Negative lexiscan stress LV function normal Low risk study  Laboratory Data:  Chemistry Recent Labs  Lab 05/02/18 0620  NA 140  K 4.0  CL 110  CO2 20*  GLUCOSE 100*  BUN 21*  CREATININE 1.39*  CALCIUM 9.3  GFRNONAA 41*  GFRAA 48*  ANIONGAP 10    Recent Labs  Lab 05/02/18 0620  PROT 7.5  ALBUMIN 3.6  AST 18  ALT 16  ALKPHOS 102  BILITOT 0.2*   Hematology Recent Labs  Lab 05/02/18 0620  WBC 10.1  RBC 4.26  HGB 11.8*  HCT 38.3  MCV 89.9  MCH 27.7  MCHC 30.8  RDW 14.3  PLT 389   Cardiac Enzymes Recent Labs  Lab 05/02/18 0620  TROPONINI <0.03   No results for input(s): TROPIPOC in the last 168 hours.  BNPNo results for input(s): BNP, PROBNP in the last 168 hours.  DDimer No results for input(s): DDIMER in the last 168 hours.  Radiology/Studies:  Dg Chest Portable 1 View  Result Date: 05/02/2018 IMPRESSION: No evidence of acute cardiopulmonary disease. Electronically Signed   By: Charline BillsSriyesh  Krishnan M.D.   On: 05/02/2018 06:56    Assessment and Plan:   1.  Atypical chest pain: -Symptoms do not appear to be cardiac in etiology given that it is worse with deep inspiration and reproducible to palpation.  Cannot exclude musculoskeletal etiology given the patient's chronic pain syndrome -It is reassuring her initial troponin was negative given the duration of her pain -Continue to cycle troponin to fully rule out -We will obtain echocardiogram -If the above work-up is unrevealing, no further inpatient cardiac work-up is needed and would recommend internal medicine evaluate the patient for noncardiac etiology of her symptoms -Could consider outpatient cardiac CT if symptoms persist  2.  Hypertension: -Blood pressure is reasonably controlled -Continue current medications per IM  3.  Hyperlipidemia: -Lipid panel pending -Not currently on a statin as outpatient secondary to intolerance -Remains  on Zetia  4.  Diabetes: -A1c pending -Per IM  5.  Chronic pain syndrome with history of narcotic abuse: -Per IM -Possibly the etiology of the patient's presenting symptoms  6.  CKD stage II-III: -Renal function appears stable   For questions or updates, please contact [facility] Please consult [email or web address] for contact info under Cardiology/STEMI.   Signed, Eula Listenyan Dunn, PA-C [facility] Pager: [phone] 05/02/2018, 10:20 AM  Patient seen with PA, agree with the above note.   She reports to me a history of continuous left-sided chest aching since 9 pm last night.  It has not changed much with meds. She appears comfortable.  No trigger.  To me, she does not describe pleuritic pain. ECG without acute changes. CXR normal.   On exam, no JVD, clear lungs, regular S1S2 w/o murmur, no edema.  Chest wall is not tender.   Suspect noncardiac chest pain.  Ongoing pain since 9 pm last night with negative troponin.  Shes does not describe pleuritic symptoms to me.  Chest wall not tender.  She had a negative Cardiolite in 7/19. ECG unremarkable.  - Continue to cycle troponin.  - If troponin remains negative, no further cardiac workup but could consider coronary CTA as outpatient.   Marca AnconaDalton Waymond Meador 05/02/2018 11:18 AM

## 2018-05-02 NOTE — H&P (Signed)
[employer] [facility] - [place] at [facility]   PATIENT NAME: Whitney Bernard Belardo    MR#:  [id number]  DATE OF BIRTH:  1960/04/30  DATE OF ADMISSION:  05/02/2018  PRIMARY CARE PHYSICIAN: Evelene CroonNiemeyer, Meindert, MD   REQUESTING/REFERRING PHYSICIAN: Mayford KnifeWilliams  CHIEF COMPLAINT:   Chief Complaint  Patient presents with  . Chest Pain    HISTORY OF PRESENT ILLNESS: Whitney Bernard Skipper  is a 58 y.o. female with a known history of chronic pain syndrome, COPD, depression, diabetes, headache, hypertension, hypothyroidism, hypokalemia, arthritis-started having chest pain on left side of her chest which was pressure-like, nonradiating, constant since last night until this morning when she received some medicines in ER.  She denies any associated cough fever or chills.  She denies any palpitation. Pain resolved some after receiving nitroglycerin and injections in ER. Because of this ER physician suggested to observe on medical service for further medical and cardiac work-up.  PAST MEDICAL HISTORY:   Past Medical History:  Diagnosis Date  . Acute pancreatitis   . Chronic pain syndrome   . COPD (chronic obstructive pulmonary disease) (HCC)   . Depression   . Diabetes mellitus without complication (HCC)   . Headache(784.0)    migraines  . Hypercholesteremia   . Hypertension   . Hyperthyroidism   . Hypokalemia   . Migraine   . Narcotic abuse (HCC)   . RA (rheumatoid arthritis) (HCC)     PAST SURGICAL HISTORY:  Past Surgical History:  Procedure Laterality Date  . ANTERIOR CERVICAL DECOMP/DISCECTOMY FUSION N/A 11/26/2012   Procedure: ANTERIOR CERVICAL DECOMPRESSION/DISCECTOMY FUSION 2 LEVELS;  Surgeon: Reinaldo Meekerandy O Kritzer, MD;  Location: [facility];  Service: Neurosurgery;  Laterality: N/A;  ANTERIOR CERVICAL DECOMPRESSION/DISCECTOMY FUSION 2 LEVELS  . CERVICAL POLYPECTOMY    . CHOLECYSTECTOMY    . ESOPHAGOGASTRODUODENOSCOPY N/A 12/26/2014   Procedure: ESOPHAGOGASTRODUODENOSCOPY (EGD);  Surgeon: Midge Miniumarren Wohl,  MD;  [facility];  Service: Endoscopy;  Laterality: N/A;  . FRACTURE SURGERY    . NECK SURGERY    . TUBAL LIGATION    . WISDOM TOOTH EXTRACTION      SOCIAL HISTORY:  Social History   Tobacco Use  . Smoking status: Former Smoker    Packs/day: 0.50    Years: 38.00    Pack years: 19.00    Types: Cigarettes    Last attempt to quit: 11/14/2012    Years since quitting: 5.4  . Smokeless tobacco: Never Used  Substance Use Topics  . Alcohol use: Not Currently    Comment: occ    FAMILY HISTORY:  Family History  Problem Relation Age of Onset  . CAD Father   . Alzheimer's disease Unknown     DRUG ALLERGIES:  Allergies  Allergen Reactions  . Almond Oil Hives  . Statins Other (See Comments)    Multiple statins    REVIEW OF SYSTEMS:   CONSTITUTIONAL: No fever, fatigue or weakness.  EYES: No blurred or double vision.  EARS, NOSE, AND THROAT: No tinnitus or ear pain.  RESPIRATORY: No cough, shortness of breath, wheezing or hemoptysis.  CARDIOVASCULAR: have chest pain,no orthopnea, edema.  GASTROINTESTINAL: No nausea, vomiting, diarrhea or abdominal pain.  GENITOURINARY: No dysuria, hematuria.  ENDOCRINE: No polyuria, nocturia,  HEMATOLOGY: No anemia, easy bruising or bleeding SKIN: No rash or lesion. MUSCULOSKELETAL: No joint pain or arthritis.   NEUROLOGIC: No tingling, numbness, weakness.  PSYCHIATRY: No anxiety or depression.   MEDICATIONS AT HOME:  Prior to Admission medications   Medication Sig Start Date  End Date Taking? Authorizing Provider  ALPRAZolam Prudy Feeler) 0.5 MG tablet Take 0.5 mg by mouth 2 (two) times daily as needed for anxiety.    Yes [provider]  busPIRone (BUSPAR) 10 MG tablet Take 10 mg by mouth 2 (two) times daily.  04/26/18  Yes [provider]  Butalbital-APAP-Caffeine 50-300-40 MG CAPS Take by mouth 3 (three) times daily as needed for headache.  04/26/18  Yes [provider]  carisoprodol (SOMA) 350 MG tablet  Take 350 mg by mouth 2 (two) times daily as needed for muscle spasms.    Yes [provider]  D3-50 1.25 MG (50000 UT) capsule Take 50,000 Units by mouth every Tuesday. 01/26/18  Yes [provider]  ezetimibe (ZETIA) 10 MG tablet Take 10 mg by mouth daily. 01/26/18  Yes [provider]  furosemide (LASIX) 40 MG tablet Take 40 mg by mouth daily.   Yes [provider]  PARoxetine (PAXIL) 40 MG tablet Take 40 mg by mouth daily.   Yes [provider]  potassium chloride SA (K-DUR,KLOR-CON) 20 MEQ tablet Take 20 mEq by mouth 3 (three) times daily.    Yes [provider]  aspirin EC 81 MG EC tablet Take 1 tablet (81 mg total) by mouth daily. Patient not taking: Reported on 05/02/2018 08/25/17   Enedina Finner, MD  HYDROcodone-acetaminophen [facility]) 5-325 MG tablet Take 1 tablet by mouth every 6 (six) hours as needed for up to 7 doses for severe pain. Patient not taking: Reported on 05/02/2018 12/18/17   Merrily Brittle, MD  oxyCODONE-acetaminophen (PERCOCET/ROXICET) 5-325 MG tablet Take 2 tablets by mouth every 4 (four) hours as needed for severe pain. Patient not taking: Reported on 05/02/2018 10/13/17   Rebecka Apley, MD      PHYSICAL EXAMINATION:   VITAL SIGNS: Blood pressure 136/85, pulse (!) 59, temperature 98.1 F (36.7 C), temperature source Oral, resp. rate 17, height 5\' 4"  (1.626 m), weight 65.8 kg, SpO2 99 %.  GENERAL:  58 y.o.-year-old patient lying in the bed with no acute distress.  EYES: Pupils equal, round, reactive to light and accommodation. No scleral icterus. Extraocular muscles intact.  HEENT: Head atraumatic, normocephalic. Oropharynx and nasopharynx clear.  NECK:  Supple, no jugular venous distention. No thyroid enlargement, no tenderness.  LUNGS: Normal breath sounds bilaterally, no wheezing, rales,rhonchi or crepitation. No use of accessory muscles of respiration.  CARDIOVASCULAR: S1, S2 normal. No murmurs, rubs, or gallops.   ABDOMEN: Soft, nontender, nondistended. Bowel sounds present. No organomegaly or mass.  EXTREMITIES: No pedal edema, cyanosis, or clubbing.  NEUROLOGIC: Cranial nerves II through XII are intact. Muscle strength 5/5 in all extremities. Sensation intact. Gait not checked.  PSYCHIATRIC: The patient is alert and oriented x 3.  SKIN: No obvious rash, lesion, or ulcer.   LABORATORY PANEL:   CBC Recent Labs  Lab 05/02/18 0620  WBC 10.1  HGB 11.8*  HCT 38.3  PLT 389  MCV 89.9  MCH 27.7  MCHC 30.8  RDW 14.3   ------------------------------------------------------------------------------------------------------------------  Chemistries  Recent Labs  Lab 05/02/18 0620  NA 140  K 4.0  CL 110  CO2 20*  GLUCOSE 100*  BUN 21*  CREATININE 1.39*  CALCIUM 9.3  AST 18  ALT 16  ALKPHOS 102  BILITOT 0.2*   ------------------------------------------------------------------------------------------------------------------ estimated creatinine clearance is 40.2 mL/min (A) (by C-G formula based on SCr of 1.39 mg/dL (H)). ------------------------------------------------------------------------------------------------------------------ No results for input(s): TSH, T4TOTAL, T3FREE, THYROIDAB in the last 72 hours.  Invalid input(s):  FREET3   Coagulation profile No results for input(s): INR, PROTIME in the last 168 hours. ------------------------------------------------------------------------------------------------------------------- No results for input(s): DDIMER in the last 72 hours. -------------------------------------------------------------------------------------------------------------------  Cardiac Enzymes Recent Labs  Lab 05/02/18 0620 05/02/18 1019  TROPONINI <0.03 <0.03   ------------------------------------------------------------------------------------------------------------------ Invalid input(s):  POCBNP  ---------------------------------------------------------------------------------------------------------------  Urinalysis    Component Value Date/Time   COLORURINE YELLOW (A) 05/02/2018 0751   APPEARANCEUR CLEAR (A) 05/02/2018 0751   APPEARANCEUR Clear 12/03/2013 1556   LABSPEC 1.023 05/02/2018 0751   LABSPEC 1.024 12/03/2013 1556   PHURINE 5.0 05/02/2018 0751   GLUCOSEU NEGATIVE 05/02/2018 0751   GLUCOSEU Negative 12/03/2013 1556   HGBUR NEGATIVE 05/02/2018 0751   BILIRUBINUR NEGATIVE 05/02/2018 0751   BILIRUBINUR Negative 12/03/2013 1556   KETONESUR NEGATIVE 05/02/2018 0751   PROTEINUR 30 (A) 05/02/2018 0751   NITRITE POSITIVE (A) 05/02/2018 0751   LEUKOCYTESUR TRACE (A) 05/02/2018 0751   LEUKOCYTESUR Negative 12/03/2013 1556     RADIOLOGY: Dg Chest Portable 1 View  Result Date: 05/02/2018 CLINICAL DATA:  Left-sided chest pain EXAM: PORTABLE CHEST 1 VIEW COMPARISON:  08/23/2017 FINDINGS: Lungs are clear.  No pleural effusion or pneumothorax. The heart is normal in size. Cervical spine fixation hardware, incompletely visualized. IMPRESSION: No evidence of acute cardiopulmonary disease. Electronically Signed   By: Charline Bills M.D.   On: 05/02/2018 06:56    EKG: Orders placed or performed during the hospital encounter of 05/02/18  . EKG 12-Lead  . EKG 12-Lead  . EKG 12-Lead  . EKG 12-Lead    IMPRESSION AND PLAN:  * Chest pain   This could be noncardiac but due to her history of multiple family members with coronary artery disease I will monitor on telemetry and follow serial troponin.  She had a negative stress test in July 2019. I will call cardiology consult to decide further cardiac work-up on her. I will also start on Protonix.  *UTI I will start on Rocephin and send cultures.  *Hyperlipidemia Check lipid panel.  *Chronic pain syndrome Continue home medications and muscle relaxers.  All the records are reviewed and case discussed with ED  provider. Management plans discussed with the patient, family and they are in agreement.  CODE STATUS: Full code.    Code Status Orders  (From admission, onward)         Start     Ordered   05/02/18 1054  Full code  Continuous     05/02/18 1054        Code Status History    Date Active Date Inactive Code Status Order ID Comments User Context   08/23/2017 0805 08/24/2017 1742 Full Code [id number]  Barbaraann Rondo, MD Inpatient   12/24/2014 1444 12/27/2014 1535 Full Code [id number]  Milagros Loll, MD ED   11/26/2012 1541 11/27/2012 1537 Full Code [id number]  Kritzer, Rolanda Lundborg, MD Inpatient       TOTAL TIME TAKING CARE OF THIS PATIENT: 45 minutes.    Altamese Dilling M.D on 05/02/2018   Between 7am to 6pm - Pager - [phone]  After 6pm go to [email or web address] - password [email or web address]  [facility] [facility]  Office  [phone]  CC: Primary care physician; Evelene Croon, MD   Note: This dictation was prepared with Dragon dictation along with smaller phrase technology. Any transcriptional errors that result from this process are unintentional.

## 2018-05-03 DIAGNOSIS — G894 Chronic pain syndrome: Secondary | ICD-10-CM | POA: Diagnosis not present

## 2018-05-03 DIAGNOSIS — J449 Chronic obstructive pulmonary disease, unspecified: Secondary | ICD-10-CM | POA: Diagnosis not present

## 2018-05-03 DIAGNOSIS — I509 Heart failure, unspecified: Secondary | ICD-10-CM | POA: Diagnosis not present

## 2018-05-03 DIAGNOSIS — I130 Hypertensive heart and chronic kidney disease with heart failure and stage 1 through stage 4 chronic kidney disease, or unspecified chronic kidney disease: Secondary | ICD-10-CM | POA: Diagnosis not present

## 2018-05-03 DIAGNOSIS — R079 Chest pain, unspecified: Secondary | ICD-10-CM | POA: Diagnosis not present

## 2018-05-03 DIAGNOSIS — R0789 Other chest pain: Secondary | ICD-10-CM | POA: Diagnosis not present

## 2018-05-03 DIAGNOSIS — G8929 Other chronic pain: Secondary | ICD-10-CM | POA: Diagnosis not present

## 2018-05-03 DIAGNOSIS — E785 Hyperlipidemia, unspecified: Secondary | ICD-10-CM | POA: Diagnosis not present

## 2018-05-03 DIAGNOSIS — N183 Chronic kidney disease, stage 3 (moderate): Secondary | ICD-10-CM | POA: Diagnosis not present

## 2018-05-03 DIAGNOSIS — E1122 Type 2 diabetes mellitus with diabetic chronic kidney disease: Secondary | ICD-10-CM | POA: Diagnosis not present

## 2018-05-03 DIAGNOSIS — N390 Urinary tract infection, site not specified: Secondary | ICD-10-CM | POA: Diagnosis not present

## 2018-05-03 LAB — BASIC METABOLIC PANEL
ANION GAP: 9 (ref 5–15)
BUN: 22 mg/dL — ABNORMAL HIGH (ref 6–20)
CHLORIDE: 108 mmol/L (ref 98–111)
CO2: 21 mmol/L — ABNORMAL LOW (ref 22–32)
Calcium: 8.9 mg/dL (ref 8.9–10.3)
Creatinine, Ser: 1.37 mg/dL — ABNORMAL HIGH (ref 0.44–1.00)
GFR calc Af Amer: 48 mL/min — ABNORMAL LOW (ref >60)
GFR calc non Af Amer: 42 mL/min — ABNORMAL LOW (ref >60)
Glucose, Bld: 156 mg/dL — ABNORMAL HIGH (ref 70–99)
Potassium: 3.6 mmol/L (ref 3.5–5.1)
Sodium: 138 mmol/L (ref 135–145)

## 2018-05-03 LAB — CBC
HCT: 36.6 % (ref 36.0–46.0)
HEMOGLOBIN: 11.6 g/dL — AB (ref 12.0–15.0)
MCH: 28.1 pg (ref 26.0–34.0)
MCHC: 31.7 g/dL (ref 30.0–36.0)
MCV: 88.6 fL (ref 80.0–100.0)
Platelets: 374 10*3/uL (ref 150–400)
RBC: 4.13 MIL/uL (ref 3.87–5.11)
RDW: 14.2 % (ref 11.5–15.5)
WBC: 10.1 10*3/uL (ref 4.0–10.5)
nRBC: 0 % (ref 0.0–0.2)

## 2018-05-03 LAB — ECHOCARDIOGRAM COMPLETE
HEIGHTINCHES: 64 in
Weight: 2320 oz

## 2018-05-03 MED ORDER — CEPHALEXIN 500 MG PO CAPS: 500.0000 mg | ORAL_CAPSULE | Freq: Two times a day (BID) | ORAL | 0 refills | Status: AC

## 2018-05-03 MED ORDER — CEPHALEXIN 500 MG PO CAPS
500.0000 mg | ORAL_CAPSULE | Freq: Two times a day (BID) | ORAL | Status: DC
  Administered 2018-05-03: 500 mg via ORAL
  Filled 2018-05-03: qty 1

## 2018-05-03 MED ORDER — PANTOPRAZOLE SODIUM 40 MG PO TBEC: 40.0000 mg | DELAYED_RELEASE_TABLET | Freq: Every day | ORAL | 0 refills | Status: DC

## 2018-05-03 NOTE — Plan of Care (Signed)

## 2018-05-03 NOTE — Progress Notes (Signed)
Pt discharged home at 1300. AVS reviewed with pt and a copy was provided. Pt educated on prescriptions, diet, and activity level. Pt verbalized understanding. Pt had no c/o pain or discomfort at time of discharge.

## 2018-05-04 LAB — URINE CULTURE: Culture: >=100000 — AB

## 2018-05-05 ENCOUNTER — Other Ambulatory Visit: Payer: Self-pay

## 2018-05-05 ENCOUNTER — Emergency Department: Payer: 59

## 2018-05-05 ENCOUNTER — Encounter: Payer: Self-pay | Admitting: Emergency Medicine

## 2018-05-05 ENCOUNTER — Emergency Department
Admission: EM | Admit: 2018-05-05 | Discharge: 2018-05-05 | Disposition: A | Discharge status: Home / Self Care | Payer: 59 | Attending: Emergency Medicine | Admitting: Emergency Medicine

## 2018-05-05 DIAGNOSIS — Z79899 Other long term (current) drug therapy: Secondary | ICD-10-CM | POA: Diagnosis not present

## 2018-05-05 DIAGNOSIS — R0602 Shortness of breath: Secondary | ICD-10-CM | POA: Diagnosis not present

## 2018-05-05 DIAGNOSIS — R079 Chest pain, unspecified: Secondary | ICD-10-CM

## 2018-05-05 DIAGNOSIS — J449 Chronic obstructive pulmonary disease, unspecified: Secondary | ICD-10-CM | POA: Insufficient documentation

## 2018-05-05 DIAGNOSIS — R0789 Other chest pain: Secondary | ICD-10-CM | POA: Diagnosis not present

## 2018-05-05 DIAGNOSIS — R0902 Hypoxemia: Secondary | ICD-10-CM | POA: Diagnosis not present

## 2018-05-05 DIAGNOSIS — Z87891 Personal history of nicotine dependence: Secondary | ICD-10-CM | POA: Insufficient documentation

## 2018-05-05 DIAGNOSIS — E119 Type 2 diabetes mellitus without complications: Secondary | ICD-10-CM | POA: Insufficient documentation

## 2018-05-05 LAB — CBC WITH DIFFERENTIAL/PLATELET
Abs Immature Granulocytes: 0.03 10*3/uL (ref 0.00–0.07)
Basophils Absolute: 0.1 10*3/uL (ref 0.0–0.1)
Basophils Relative: 1 %
Eosinophils Absolute: 0.1 10*3/uL (ref 0.0–0.5)
Eosinophils Relative: 1 %
HCT: 38.6 % (ref 36.0–46.0)
HEMOGLOBIN: 12 g/dL (ref 12.0–15.0)
Immature Granulocytes: 0 %
LYMPHS PCT: 26 %
Lymphs Abs: 3 10*3/uL (ref 0.7–4.0)
MCH: 27.4 pg (ref 26.0–34.0)
MCHC: 31.1 g/dL (ref 30.0–36.0)
MCV: 88.1 fL (ref 80.0–100.0)
Monocytes Absolute: 0.8 10*3/uL (ref 0.1–1.0)
Monocytes Relative: 7 %
NEUTROS PCT: 65 %
Neutro Abs: 7.4 10*3/uL (ref 1.7–7.7)
Platelets: 442 10*3/uL — ABNORMAL HIGH (ref 150–400)
RBC: 4.38 MIL/uL (ref 3.87–5.11)
RDW: 14.2 % (ref 11.5–15.5)
WBC: 11.3 10*3/uL — ABNORMAL HIGH (ref 4.0–10.5)
nRBC: 0 % (ref 0.0–0.2)

## 2018-05-05 LAB — COMPREHENSIVE METABOLIC PANEL
ALT: 15 U/L (ref 0–44)
AST: 14 U/L — ABNORMAL LOW (ref 15–41)
Albumin: 3.9 g/dL (ref 3.5–5.0)
Alkaline Phosphatase: 121 U/L (ref 38–126)
Anion gap: 9 (ref 5–15)
BUN: 21 mg/dL — ABNORMAL HIGH (ref 6–20)
CO2: 22 mmol/L (ref 22–32)
CREATININE: 1.19 mg/dL — AB (ref 0.44–1.00)
Calcium: 9.2 mg/dL (ref 8.9–10.3)
Chloride: 109 mmol/L (ref 98–111)
GFR calc Af Amer: 57 mL/min — ABNORMAL LOW (ref >60)
GFR calc non Af Amer: 50 mL/min — ABNORMAL LOW (ref >60)
Glucose, Bld: 106 mg/dL — ABNORMAL HIGH (ref 70–99)
Potassium: 3.7 mmol/L (ref 3.5–5.1)
SODIUM: 140 mmol/L (ref 135–145)
Total Bilirubin: 0.3 mg/dL (ref 0.3–1.2)
Total Protein: 8.2 g/dL — ABNORMAL HIGH (ref 6.5–8.1)

## 2018-05-05 LAB — TROPONIN I: Troponin I: <0.03 ng/mL (ref <0.03)

## 2018-05-05 MED ORDER — SODIUM CHLORIDE 0.9 % IV BOLUS
1000.0000 mL | Freq: Once | INTRAVENOUS | Status: AC
  Administered 2018-05-05: 1000 mL via INTRAVENOUS

## 2018-05-05 MED ORDER — LORAZEPAM 1 MG PO TABS
1.0000 mg | ORAL_TABLET | Freq: Once | ORAL | Status: AC
  Administered 2018-05-05: 1 mg via ORAL
  Filled 2018-05-05: qty 1

## 2018-05-05 MED ORDER — OXYCODONE-ACETAMINOPHEN 5-325 MG PO TABS: 1.0000 | ORAL_TABLET | ORAL | 0 refills | Status: DC | PRN

## 2018-05-05 MED ORDER — OXYCODONE-ACETAMINOPHEN 5-325 MG PO TABS
1.0000 | ORAL_TABLET | Freq: Once | ORAL | Status: AC
  Administered 2018-05-05: 1 via ORAL
  Filled 2018-05-05: qty 1

## 2018-05-05 MED ORDER — IOHEXOL 350 MG/ML SOLN
75.0000 mL | Freq: Once | INTRAVENOUS | Status: AC | PRN
  Administered 2018-05-05: 75 mL via INTRAVENOUS

## 2018-05-05 MED ORDER — KETOROLAC TROMETHAMINE 30 MG/ML IJ SOLN
15.0000 mg | Freq: Once | INTRAMUSCULAR | Status: AC
  Administered 2018-05-05: 15 mg via INTRAVENOUS
  Filled 2018-05-05: qty 1

## 2018-05-05 MED ORDER — FAMOTIDINE IN NACL 20-0.9 MG/50ML-% IV SOLN
20.0000 mg | Freq: Once | INTRAVENOUS | Status: AC
  Administered 2018-05-05: 20 mg via INTRAVENOUS
  Filled 2018-05-05: qty 50

## 2018-05-05 MED ORDER — ALPRAZOLAM 0.5 MG PO TABS: 0.5000 mg | ORAL_TABLET | Freq: Every evening | ORAL | 0 refills | Status: DC | PRN

## 2018-05-05 NOTE — Discharge Instructions (Addendum)
You may take Percocet as needed for pain.  You may take Xanax as needed for anxiety.  Apply moist heat to affected area several times daily.  Return to the ER for worsening symptoms, persistent vomiting, difficulty breathing or other concerns.

## 2018-05-05 NOTE — ED Notes (Signed)
Pt to xray

## 2018-05-05 NOTE — ED Notes (Addendum)
Pt calling out for this nurse. States she told EMS she was having trouble breathing and it was ignored and now her sob was increasing. Pt told MD would be notified. Pt asking what type of pain medications were ordered.pt states if this nurse is able to give her pain medications first that would be better. Denies cough or congestion. Pt currently has no increased work of breathing or acute distress noted. O2 saturation 97%. MD aware.

## 2018-05-05 NOTE — ED Provider Notes (Signed)
[facility] Emergency Department Provider Note   ____________________________________________   First MD Initiated Contact with Patient 05/05/18 [phone]     (approximate)  I have reviewed the triage vital signs and the nursing notes.   HISTORY  Chief Complaint Chest pain   HPI Whitney Bernard is a 58 y.o. female brought to the ED from home via EMS with a chief complaint of chest pain.  Patient has a history of chronic pain syndrome, diabetes, hypertension, pancreatitis, history of narcotic abuse who was hospitalized on 05/02/2018 for same.  She had serial negative troponins, cardiology consult with normal echocardiogram.  Last Myoview stress test was approximately 1 year ago which was normal.  States she was discharged home without any difference in her chest pain which she relates on her left side, waxing and waning and exacerbated with movement.  Denies recent fever, chills, cough, shortness of breath, abdominal pain, nausea, vomiting, diarrhea.  Denies recent travel or trauma.       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   Loose stools    Diarrhea    Hypokalemia 12/24/2014   BP (high blood pressure) 02/23/2013   Misuse of prescription only drugs (HCC) 02/23/2013   Anxiety 06/22/2012   Colitis 05/11/2012   Clinical depression 05/11/2012   Hypercholesterolemia 05/11/2012   Gastroduodenal ulcer 03/23/2012    Past  Surgical History:  Procedure Laterality Date   ANTERIOR CERVICAL DECOMP/DISCECTOMY FUSION N/A 11/26/2012   Procedure: ANTERIOR CERVICAL DECOMPRESSION/DISCECTOMY FUSION 2 LEVELS;  Surgeon: Reinaldo Meeker, MD;  Location: [facility] NEURO ORS;  Service: Neurosurgery;  Laterality: N/A;  ANTERIOR CERVICAL DECOMPRESSION/DISCECTOMY FUSION 2 LEVELS   CERVICAL POLYPECTOMY     CHOLECYSTECTOMY     ESOPHAGOGASTRODUODENOSCOPY N/A 12/26/2014   Procedure: ESOPHAGOGASTRODUODENOSCOPY (EGD);  Surgeon: Midge Minium, MD;  Location: [facility] ENDOSCOPY;  Service: Endoscopy;  Laterality: N/A;   FRACTURE SURGERY     NECK SURGERY     TUBAL LIGATION     WISDOM TOOTH EXTRACTION      Prior to Admission medications   Medication Sig Start Date End Date Taking? Authorizing Provider  ALPRAZolam Prudy Feeler) 0.5 MG tablet Take 0.5 mg by mouth 2 (two) times daily as needed for anxiety.     [provider]  aspirin EC 81 MG EC tablet Take 1 tablet (81 mg total) by mouth daily. Patient not taking: Reported on 05/02/2018 08/25/17   Enedina Finner, MD  busPIRone (BUSPAR) 10 MG tablet Take 10 mg by mouth 2 (two) times daily.  04/26/18   [provider]  Butalbital-APAP-Caffeine 50-300-40 MG CAPS Take by mouth 3 (three) times daily as needed for headache.  04/26/18   [provider]  carisoprodol (SOMA) 350 MG tablet Take 350 mg by mouth 2 (two) times daily as needed for muscle spasms.     [provider]  cephALEXin (KEFLEX) 500 MG capsule Take 1 capsule (500 mg  total) by mouth every 12 (twelve) hours for 2 days. 05/03/18 05/05/18  Altamese Dilling, MD  D3-50 1.25 MG (50000 UT) capsule Take 50,000 Units by mouth every Tuesday. 01/26/18   [provider]  ezetimibe (ZETIA) 10 MG tablet Take 10 mg by mouth daily. 01/26/18   [provider]  furosemide (LASIX) 40 MG tablet Take 40 mg by mouth daily.    [provider]  HYDROcodone-acetaminophen (NORCO) 5-325 MG tablet Take 1 tablet by  mouth every 6 (six) hours as needed for up to 7 doses for severe pain. Patient not taking: Reported on 05/02/2018 12/18/17   Merrily Brittle, MD  oxyCODONE-acetaminophen (PERCOCET/ROXICET) 5-325 MG tablet Take 1 tablet by mouth every 4 (four) hours as needed for severe pain. 05/05/18   Irean Hong, MD  pantoprazole (PROTONIX) 40 MG tablet Take 1 tablet (40 mg total) by mouth daily. 05/04/18   Altamese Dilling, MD  PARoxetine (PAXIL) 40 MG tablet Take 40 mg by mouth daily.    [provider]  potassium chloride SA (K-DUR,KLOR-CON) 20 MEQ tablet Take 20 mEq by mouth 3 (three) times daily.     [provider]    Allergies Almond oil and Statins  Family History  Problem Relation Age of Onset   CAD Father    Alzheimer's disease Other     Social History Social History   Tobacco Use   Smoking status: Former Smoker    Packs/day: 0.50    Years: 38.00    Pack years: 19.00    Types: Cigarettes    Last attempt to quit: 11/14/2012    Years since quitting: 5.4   Smokeless tobacco: Never Used  Substance Use Topics   Alcohol use: Not Currently    Comment: occ   Drug use: No    Review of Systems  Constitutional: No fever/chills Eyes: No visual changes. ENT: No sore throat. Cardiovascular: Positive for chest pain. Respiratory: Denies shortness of breath. Gastrointestinal: No abdominal pain.  No nausea, no vomiting.  No diarrhea.  No constipation. Genitourinary: Negative for dysuria. Musculoskeletal: Negative for back pain. Skin: Negative for rash. Neurological: Negative for headaches, focal weakness or numbness.   ____________________________________________   PHYSICAL EXAM:  VITAL SIGNS: ED Triage Vitals  Enc Vitals Group     BP 05/05/18 0446 (!) 153/103     Pulse Rate 05/05/18 0446 72     Resp 05/05/18 0446 (!) 22     Temp 05/05/18 0446 98.4 F (36.9 C)     Temp Source 05/05/18 0446 Oral     SpO2 05/05/18 0446 98 %     Weight 05/05/18 0448 145  lb (65.8 kg)     Height 05/05/18 0448 5\' 4"  (1.626 m)     Head Circumference --      Peak Flow --      Pain Score 05/05/18 0448 5     Pain Loc --      Pain Edu? --      Excl. in GC? --     Constitutional: Alert and oriented. Well appearing and in no acute distress. Eyes: Conjunctivae are normal. PERRL. EOMI. Head: Atraumatic. Nose: No congestion/rhinnorhea. Mouth/Throat: Mucous membranes are moist.  Oropharynx non-erythematous. Neck: No stridor.   Cardiovascular: Normal rate, regular rhythm. Grossly normal heart sounds.  Good peripheral circulation. Respiratory: Normal respiratory effort.  No retractions. Lungs CTAB.  Anterior chest tender to palpation. Gastrointestinal: Soft and nontender. No distention. No abdominal bruits. No CVA tenderness. Musculoskeletal: No lower extremity tenderness  nor edema.  No joint effusions. Neurologic:  Normal speech and language. No gross focal neurologic deficits are appreciated. No gait instability. Skin:  Skin is warm, dry and intact. No rash noted. Psychiatric: Mood and affect are normal. Speech and behavior are normal.  ____________________________________________   LABS (all labs ordered are listed, but only abnormal results are displayed)  Labs Reviewed  CBC WITH DIFFERENTIAL/PLATELET - Abnormal; Notable for the following components:      Result Value   WBC 11.3 (*)    Platelets 442 (*)    All other components within normal limits  COMPREHENSIVE METABOLIC PANEL - Abnormal; Notable for the following components:   Glucose, Bld 106 (*)    BUN 21 (*)    Creatinine, Ser 1.19 (*)    Total Protein 8.2 (*)    AST 14 (*)    GFR calc non Af Amer 50 (*)    GFR calc Af Amer 57 (*)    All other components within normal limits  TROPONIN I   ____________________________________________  EKG  ED ECG REPORT I, Novalynn Branaman J, the attending physician, personally viewed and interpreted this ECG.   Date: 05/05/2018  EKG Time: 0446  Rate: 73   Rhythm: normal EKG, normal sinus rhythm  Axis: Normal  Intervals:none  ST&T Change: Nonspecific  ____________________________________________  RADIOLOGY  ED MD interpretation: No acute cardiopulmonary process, no pulmonary embolism  Official radiology report(s): Dg Chest 2 View  Result Date: 05/05/2018 CLINICAL DATA:  Chest pain EXAM: CHEST - 2 VIEW COMPARISON:  05/02/2018 FINDINGS: There is shallow lung inflation. The cardiomediastinal contours are normal. Linear atelectasis at the lingula. No focal consolidation or pulmonary edema. There is no pleural effusion or pneumothorax. IMPRESSION: No active cardiopulmonary disease. Electronically Signed   By: Deatra Robinson M.D.   On: 05/05/2018 05:27   Ct Angio Chest Pe W/cm &/or Wo Cm  Result Date: 05/05/2018 CLINICAL DATA:  Left-sided chest pressure since last night EXAM: CT ANGIOGRAPHY CHEST WITH CONTRAST TECHNIQUE: Multidetector CT imaging of the chest was performed using the standard protocol during bolus administration of intravenous contrast. Multiplanar CT image reconstructions and MIPs were obtained to evaluate the vascular anatomy. CONTRAST:  22mL OMNIPAQUE IOHEXOL 350 MG/ML SOLN COMPARISON:  Chest x-ray from earlier today FINDINGS: Cardiovascular: Satisfactory opacification of the pulmonary arteries to the segmental level. No evidence of pulmonary embolism. Normal heart size. No pericardial effusion. Mediastinum/Nodes: Negative for adenopathy Lungs/Pleura: There is no edema, consolidation, effusion, or pneumothorax. Low volume chest with mild atelectasis. Upper Abdomen: Calcification along the posterior right liver which may be related to prior cholecystectomy. Musculoskeletal: No acute or aggressive finding Review of the MIP images confirms the above findings. IMPRESSION: No acute finding.  Negative for pulmonary embolism. Electronically Signed   By: Marnee Spring M.D.   On: 05/05/2018 06:50     ____________________________________________   PROCEDURES  Procedure(s) performed (including Critical Care):  Procedures   ____________________________________________   INITIAL IMPRESSION / ASSESSMENT AND PLAN / ED COURSE  As part of my medical decision making, I reviewed the following data within the electronic MEDICAL RECORD NUMBER Nursing notes reviewed and incorporated, Labs reviewed, EKG interpreted, Old chart reviewed, Radiograph reviewed and Notes from prior ED visits        58 year old female without prior cardiac history who returns to the ED for reproducible chest pain. Differential diagnosis includes, but is not limited to, ACS, aortic dissection, pulmonary embolism, cardiac tamponade, pneumothorax, pneumonia, pericarditis, myocarditis, GI-related causes including esophagitis/gastritis, and musculoskeletal chest  wall pain.    I personally reviewed patient's old chart and it looks like she was regularly receiving narcotic and benzodiazepine prescriptions from her PCP until 10/2017.  Patient tells me her PCP moved and she has switched her care to Dr. Lacie ScottsNiemeyer. She expressed surprise that Dr. Glenis SmokerNeimeyer cannot write her for her pain medicines and is now seeking a new PCP.  I have also reviewed patient's recent hospitalization including cardiology notes and echocardiogram reports.  EKG appears unchanged.  Do not highly suspect ACS, PE, aortic dissection.  Will repeat cardiac enzymes.  Will administer oral Percocet and Ativan in addition to IV Pepcid.  Will not administer IV narcotics.   Clinical Course as of May 05 655  Wed May 05, 2018  0621 Updated patient on all test results.  States chest pain is better but still feels short of breath.  Given that her GFR is improved, discussed with patient and will proceed with CTA chest to rule out pulmonary embolism.  Do not feel patient warrants repeat troponin as she was recently ruled out in the hospital.   [JS]  [phone] Updated patient  on negative CT result.  She will be discharged after IV fluids are completed.  Will discharge on limited prescription Percocet and Xanax and she will follow-up closely with her PCP as needed as well as cardiology.  Strict return precautions given.  Patient verbalizes understanding agrees with plan of care.   [JS]    Clinical Course User Index [JS] Irean HongSung, Aidenjames Heckmann J, MD     ____________________________________________   FINAL CLINICAL IMPRESSION(S) / ED DIAGNOSES  Final diagnoses:  Chest pain, unspecified type  Chest wall pain     ED Discharge Orders         Ordered    oxyCODONE-acetaminophen (PERCOCET/ROXICET) 5-325 MG tablet  Every 4 hours PRN     05/05/18 [id number]           Note:  This document was prepared using Dragon voice recognition software and may include unintentional dictation errors.   Irean HongSung, Braidyn Scorsone J, MD 05/05/18 [phone]

## 2018-05-05 NOTE — ED Triage Notes (Signed)
Pt arrived to ED via EMS from home with c/o left sided chest pressure since 2100 tonight. Denies radiating pain. Was seen in this ED on Sunday for the same.

## 2018-05-05 NOTE — ED Notes (Signed)
Pt to CT

## 2018-05-09 NOTE — Discharge Summary (Signed)
[facility] - [place] at [facility]   PATIENT NAME: Whitney Bernard    MR#:  [id number]  DATE OF BIRTH:  [birth year]  DATE OF ADMISSION:  05/02/2018 ADMITTING PHYSICIAN: Altamese Dilling, MD  DATE OF DISCHARGE: 05/03/2018  1:20 PM  PRIMARY CARE PHYSICIAN: Evelene Croon, MD    ADMISSION DIAGNOSIS:  Moderate risk chest pain [R07.9]  DISCHARGE DIAGNOSIS:  Active Problems:   Chest pain, non-cardiac   SECONDARY DIAGNOSIS:   Past Medical History:  Diagnosis Date  . Acute pancreatitis   . Chronic pain syndrome   . COPD (chronic obstructive pulmonary disease) (HCC)   . Depression   . Diabetes mellitus without complication (HCC)   . Headache(784.0)    migraines  . Hypercholesteremia   . Hypertension   . Hyperthyroidism   . Hypokalemia   . Migraine   . Narcotic abuse (HCC)   . RA (rheumatoid arthritis) [facility])     HOSPITAL COURSE:   For chest pain, 3 troponin and telemetry monitored. Pt remained stable. Echo was done as advised cardiologist. She was stable and pain free next day so advised to follow in cardio clinic and given prescription for PPI.  DISCHARGE CONDITIONS:   Stable.  CONSULTS OBTAINED:  Treatment Team:  Laurey Morale, MD  DRUG ALLERGIES:   Allergies  Allergen Reactions  . Almond Oil Hives  . Statins Other (See Comments)    Multiple statins    DISCHARGE MEDICATIONS:   Allergies as of 05/03/2018      Reactions   Almond Oil Hives   Statins Other (See Comments)   Multiple statins      Medication List    STOP taking these medications   oxyCODONE-acetaminophen 5-325 MG tablet Commonly known as:  PERCOCET/ROXICET     TAKE these medications   aspirin 81 MG EC tablet Take 1 tablet (81 mg total) by mouth daily.   busPIRone 10 MG tablet Commonly known as:  BUSPAR Take 10 mg by mouth 2 (two) times daily.   Butalbital-APAP-Caffeine 50-300-40 MG Caps Take by mouth 3 (three) times daily as needed for headache.    carisoprodol 350 MG tablet Commonly known as:  SOMA Take 350 mg by mouth 2 (two) times daily as needed for muscle spasms.   D3-50 1.25 MG (50000 UT) capsule Generic drug:  Cholecalciferol Take 50,000 Units by mouth every Tuesday.   ezetimibe 10 MG tablet Commonly known as:  ZETIA Take 10 mg by mouth daily.   furosemide 40 MG tablet Commonly known as:  LASIX Take 40 mg by mouth daily.   HYDROcodone-acetaminophen 5-325 MG tablet Commonly known as:  Norco Take 1 tablet by mouth every 6 (six) hours as needed for up to 7 doses for severe pain.   pantoprazole 40 MG tablet Commonly known as:  PROTONIX Take 1 tablet (40 mg total) by mouth daily.   PARoxetine 40 MG tablet Commonly known as:  PAXIL Take 40 mg by mouth daily.   potassium chloride SA 20 MEQ tablet Commonly known as:  K-DUR,KLOR-CON Take 20 mEq by mouth 3 (three) times daily.     ASK your doctor about these medications   cephALEXin 500 MG capsule Commonly known as:  KEFLEX Take 1 capsule (500 mg total) by mouth every 12 (twelve) hours for 2 days. Ask about: Should I take this medication?        DISCHARGE INSTRUCTIONS:    Follow with PMD and cardiologist in 1-2 weeks.  If you experience worsening of your  admission symptoms, develop shortness of breath, life threatening emergency, suicidal or homicidal thoughts you must seek medical attention immediately by calling 911 or calling your MD immediately  if symptoms less severe.  You Must read complete instructions/literature along with all the possible adverse reactions/side effects for all the Medicines you take and that have been prescribed to you. Take any new Medicines after you have completely understood and accept all the possible adverse reactions/side effects.   Please note  You were cared for by a hospitalist during your hospital stay. If you have any questions about your discharge medications or the care you received while you were in the hospital  after you are discharged, you can call the unit and asked to speak with the hospitalist on call if the hospitalist that took care of you is not available. Once you are discharged, your primary care physician will handle any further medical issues. Please note that NO REFILLS for any discharge medications will be authorized once you are discharged, as it is imperative that you return to your primary care physician (or establish a relationship with a primary care physician if you do not have one) for your aftercare needs so that they can reassess your need for medications and monitor your lab values.    Today   CHIEF COMPLAINT:   Chief Complaint  Patient presents with  . Chest Pain    HISTORY OF PRESENT ILLNESS:  Whitney Bernard  is a 58 y.o. female with a known history of chronic pain syndrome, COPD, depression, diabetes, headache, hypertension, hypothyroidism, hypokalemia, arthritis-started having chest pain on left side of her chest which was pressure-like, nonradiating, constant since last night until this morning when she received some medicines in ER.  She denies any associated cough fever or chills.  She denies any palpitation. Pain resolved some after receiving nitroglycerin and injections in ER. Because of this ER physician suggested to observe on medical service for further medical and cardiac work-up.   VITAL SIGNS:  Blood pressure (!) 148/82, pulse 65, temperature 98.4 F (36.9 C), temperature source Oral, resp. rate 19, height 5\' 6"  (1.676 m), weight 68.1 kg, SpO2 100 %.  I/O:  No intake or output data in the 24 hours ending 05/09/18 0845  PHYSICAL EXAMINATION:  GENERAL:  58 y.o.-year-old patient lying in the bed with no acute distress.  EYES: Pupils equal, round, reactive to light and accommodation. No scleral icterus. Extraocular muscles intact.  HEENT: Head atraumatic, normocephalic. Oropharynx and nasopharynx clear.  NECK:  Supple, no jugular venous distention. No thyroid  enlargement, no tenderness.  LUNGS: Normal breath sounds bilaterally, no wheezing, rales,rhonchi or crepitation. No use of accessory muscles of respiration.  CARDIOVASCULAR: S1, S2 normal. No murmurs, rubs, or gallops.  ABDOMEN: Soft, non-tender, non-distended. Bowel sounds present. No organomegaly or mass.  EXTREMITIES: No pedal edema, cyanosis, or clubbing.  NEUROLOGIC: Cranial nerves II through XII are intact. Muscle strength 5/5 in all extremities. Sensation intact. Gait not checked.  PSYCHIATRIC: The patient is alert and oriented x 3.  SKIN: No obvious rash, lesion, or ulcer.   DATA REVIEW:   CBC Recent Labs  Lab 05/05/18 0453  WBC 11.3*  HGB 12.0  HCT 38.6  PLT 442*    Chemistries  Recent Labs  Lab 05/05/18 0453  NA 140  K 3.7  CL 109  CO2 22  GLUCOSE 106*  BUN 21*  CREATININE 1.19*  CALCIUM 9.2  AST 14*  ALT 15  ALKPHOS 121  BILITOT 0.3  Cardiac Enzymes Recent Labs  Lab 05/05/18 0453  TROPONINI <0.03    Microbiology Results  Results for orders placed or performed during the hospital encounter of 05/02/18  Urine Culture     Status: Abnormal   Collection Time: 05/02/18  7:51 AM  Result Value Ref Range Status   Specimen Description   Final    URINE, RANDOM Performed at [facility], [address] [place], [place] [address]    Special Requests   Final    NONE Performed at [facility], [address] [place], [place] [address]    Culture >=100,000 COLONIES/mL ENTEROBACTER AEROGENES (A)  Final   Report Status 05/04/2018 FINAL  Final   Organism ID, Bacteria ENTEROBACTER AEROGENES (A)  Final      Susceptibility   Enterobacter aerogenes - MIC*    CEFAZOLIN RESISTANT Resistant     CEFTRIAXONE <=1 SENSITIVE Sensitive     CIPROFLOXACIN <=0.25 SENSITIVE Sensitive     GENTAMICIN <=1 SENSITIVE Sensitive     IMIPENEM 2 SENSITIVE Sensitive     NITROFURANTOIN 128 RESISTANT Resistant     TRIMETH/SULFA <=20 SENSITIVE Sensitive      PIP/TAZO <=4 SENSITIVE Sensitive     * >=100,000 COLONIES/mL ENTEROBACTER AEROGENES    RADIOLOGY:  No results found.  EKG:   Orders placed or performed during the hospital encounter of 05/02/18  . EKG 12-Lead  . EKG 12-Lead  . EKG 12-Lead  . EKG 12-Lead  . EKG      Management plans discussed with the patient, family and they are in agreement.  CODE STATUS:  Code Status History    Date Active Date Inactive Code Status Order ID Comments User Context   05/02/2018 1054 05/03/2018 1626 Full Code [id number]  Altamese DillingVachhani, Gerrell Tabet, MD ED   08/23/2017 0805 08/24/2017 1742 Full Code [id number]  Barbaraann RondoSridharan, Prasanna, MD Inpatient   12/24/2014 1444 12/27/2014 1535 Full Code [id number]  Milagros LollSudini, Srikar, MD ED   11/26/2012 1541 11/27/2012 1537 Full Code [id number]  Kritzer, Rolanda Lundborgandy O, MD Inpatient      TOTAL TIME TAKING CARE OF THIS PATIENT: 35 minutes.    Altamese DillingVaibhavkumar Jeromie Gainor M.D on 05/09/2018 at 8:45 AM  Between 7am to 6pm - Pager - [phone]  After 6pm go to [email or web address] - password [email or web address]  [facility]  Office  [phone]  CC: Primary care physician; Evelene CroonNiemeyer, Meindert, MD   Note: This dictation was prepared with Dragon dictation along with smaller phrase technology. Any transcriptional errors that result from this process are unintentional.

## 2018-05-19 MED FILL — BUTALBITAL-APAP-CAFFEINE 50: 50-300-40 | 30 days supply | Qty: 90 | Fill #0

## 2018-05-19 MED FILL — PARoxetine HCL 40 MG TABS: 40 | 90 days supply | Qty: 90 | Fill #0

## 2018-05-26 MED FILL — POTASSIUM CHLORIDE CRYS ER: 20 | 90 days supply | Qty: 360 | Fill #0

## 2018-05-26 MED FILL — FUROSEMIDE 40 MG TAB: 40 | 90 days supply | Qty: 180 | Fill #0

## 2018-06-14 MED FILL — BUTALBITAL-APAP-CAFFEINE 50: 50-300-40 | 30 days supply | Qty: 90 | Fill #1

## 2018-06-14 MED FILL — busPIRone HCL 10 MG TABS: 10 | 30 days supply | Qty: 90 | Fill #0

## 2018-07-08 MED FILL — BUTALBITAL-APAP-CAFFEINE 50: 50-300-40 | 30 days supply | Qty: 90 | Fill #2

## 2018-08-03 DIAGNOSIS — M545 Low back pain: Secondary | ICD-10-CM | POA: Diagnosis not present

## 2018-08-03 DIAGNOSIS — E7800 Pure hypercholesterolemia, unspecified: Secondary | ICD-10-CM | POA: Diagnosis not present

## 2018-08-03 DIAGNOSIS — E119 Type 2 diabetes mellitus without complications: Secondary | ICD-10-CM | POA: Diagnosis not present

## 2018-08-03 DIAGNOSIS — M542 Cervicalgia: Secondary | ICD-10-CM | POA: Diagnosis not present

## 2018-08-03 DIAGNOSIS — F419 Anxiety disorder, unspecified: Secondary | ICD-10-CM | POA: Diagnosis not present

## 2018-10-28 DIAGNOSIS — Z1331 Encounter for screening for depression: Secondary | ICD-10-CM | POA: Diagnosis not present

## 2018-10-28 DIAGNOSIS — N189 Chronic kidney disease, unspecified: Secondary | ICD-10-CM | POA: Diagnosis not present

## 2018-10-28 DIAGNOSIS — M542 Cervicalgia: Secondary | ICD-10-CM | POA: Diagnosis not present

## 2018-10-28 DIAGNOSIS — E119 Type 2 diabetes mellitus without complications: Secondary | ICD-10-CM | POA: Diagnosis not present

## 2018-10-28 DIAGNOSIS — M069 Rheumatoid arthritis, unspecified: Secondary | ICD-10-CM | POA: Diagnosis not present

## 2018-10-28 DIAGNOSIS — E7800 Pure hypercholesterolemia, unspecified: Secondary | ICD-10-CM | POA: Diagnosis not present

## 2018-11-02 ENCOUNTER — Emergency Department: Payer: 59

## 2018-11-02 ENCOUNTER — Telehealth: Payer: Self-pay | Admitting: Emergency Medicine

## 2018-11-02 ENCOUNTER — Emergency Department
Admission: EM | Admit: 2018-11-02 | Discharge: 2018-11-02 | Discharge status: Against Medical Advice | Payer: 59 | Attending: Emergency Medicine | Admitting: Emergency Medicine

## 2018-11-02 ENCOUNTER — Other Ambulatory Visit: Payer: Self-pay

## 2018-11-02 DIAGNOSIS — I10 Essential (primary) hypertension: Secondary | ICD-10-CM | POA: Diagnosis not present

## 2018-11-02 DIAGNOSIS — Z87891 Personal history of nicotine dependence: Secondary | ICD-10-CM | POA: Insufficient documentation

## 2018-11-02 DIAGNOSIS — R0602 Shortness of breath: Secondary | ICD-10-CM | POA: Insufficient documentation

## 2018-11-02 DIAGNOSIS — E119 Type 2 diabetes mellitus without complications: Secondary | ICD-10-CM | POA: Insufficient documentation

## 2018-11-02 DIAGNOSIS — R438 Other disturbances of smell and taste: Secondary | ICD-10-CM | POA: Insufficient documentation

## 2018-11-02 DIAGNOSIS — U071 COVID-19: Secondary | ICD-10-CM | POA: Insufficient documentation

## 2018-11-02 DIAGNOSIS — Z5320 Procedure and treatment not carried out because of patient's decision for unspecified reasons: Secondary | ICD-10-CM | POA: Insufficient documentation

## 2018-11-02 DIAGNOSIS — J449 Chronic obstructive pulmonary disease, unspecified: Secondary | ICD-10-CM | POA: Insufficient documentation

## 2018-11-02 DIAGNOSIS — Z79899 Other long term (current) drug therapy: Secondary | ICD-10-CM | POA: Diagnosis not present

## 2018-11-02 DIAGNOSIS — M791 Myalgia, unspecified site: Secondary | ICD-10-CM | POA: Diagnosis present

## 2018-11-02 DIAGNOSIS — R070 Pain in throat: Secondary | ICD-10-CM | POA: Diagnosis not present

## 2018-11-02 DIAGNOSIS — R05 Cough: Secondary | ICD-10-CM | POA: Insufficient documentation

## 2018-11-02 LAB — TROPONIN I (HIGH SENSITIVITY): Troponin I (High Sensitivity): 3 ng/L (ref <18)

## 2018-11-02 LAB — CBC WITH DIFFERENTIAL/PLATELET
Abs Immature Granulocytes: 0.05 10*3/uL (ref 0.00–0.07)
Basophils Absolute: 0.1 10*3/uL (ref 0.0–0.1)
Basophils Relative: 1 %
Eosinophils Absolute: 0.2 10*3/uL (ref 0.0–0.5)
Eosinophils Relative: 2 %
HCT: 38.6 % (ref 36.0–46.0)
Hemoglobin: 12.2 g/dL (ref 12.0–15.0)
Immature Granulocytes: 1 %
Lymphocytes Relative: 33 %
Lymphs Abs: 3.3 10*3/uL (ref 0.7–4.0)
MCH: 27.8 pg (ref 26.0–34.0)
MCHC: 31.6 g/dL (ref 30.0–36.0)
MCV: 87.9 fL (ref 80.0–100.0)
Monocytes Absolute: 0.9 10*3/uL (ref 0.1–1.0)
Monocytes Relative: 9 %
Neutro Abs: 5.3 10*3/uL (ref 1.7–7.7)
Neutrophils Relative %: 54 %
Platelets: 376 10*3/uL (ref 150–400)
RBC: 4.39 MIL/uL (ref 3.87–5.11)
RDW: 15.4 % (ref 11.5–15.5)
WBC: 9.9 10*3/uL (ref 4.0–10.5)
nRBC: 0 % (ref 0.0–0.2)

## 2018-11-02 LAB — COMPREHENSIVE METABOLIC PANEL
ALT: 21 U/L (ref 0–44)
AST: 20 U/L (ref 15–41)
Albumin: 3.8 g/dL (ref 3.5–5.0)
Alkaline Phosphatase: 95 U/L (ref 38–126)
Anion gap: 10 (ref 5–15)
BUN: 23 mg/dL — ABNORMAL HIGH (ref 6–20)
CO2: 20 mmol/L — ABNORMAL LOW (ref 22–32)
Calcium: 9 mg/dL (ref 8.9–10.3)
Chloride: 107 mmol/L (ref 98–111)
Creatinine, Ser: 1.13 mg/dL — ABNORMAL HIGH (ref 0.44–1.00)
GFR calc Af Amer: >60 mL/min (ref >60)
GFR calc non Af Amer: 53 mL/min — ABNORMAL LOW (ref >60)
Glucose, Bld: 190 mg/dL — ABNORMAL HIGH (ref 70–99)
Potassium: 3.6 mmol/L (ref 3.5–5.1)
Sodium: 137 mmol/L (ref 135–145)
Total Bilirubin: 0.4 mg/dL (ref 0.3–1.2)
Total Protein: 7.2 g/dL (ref 6.5–8.1)

## 2018-11-02 LAB — SARS CORONAVIRUS 2 BY RT PCR (HOSPITAL ORDER, PERFORMED IN ~~LOC~~ HOSPITAL LAB): SARS Coronavirus 2: POSITIVE — AB

## 2018-11-02 NOTE — Telephone Encounter (Signed)
Called patient to inform of covid result positive on home and cell phone.  Home ph. No answer, mobile just has busy signal--no ring.

## 2018-11-02 NOTE — ED Notes (Signed)
X-ray at bedside

## 2018-11-02 NOTE — ED Triage Notes (Addendum)
SOB since 2000 on 11/01/2018, cough that started Sunday 10/31/18. Patient concerned because husband and son both have COVID-71. Patient does have a history of COPD. Patient endorses 6/10 midsternal chest pain that is worse on inspiration.

## 2018-11-02 NOTE — ED Provider Notes (Signed)
[facility] Emergency Department Provider Note  ____________________________________________   First MD Initiated Contact with Patient 11/02/18 0406     (approximate)  I have reviewed the triage vital signs and the nursing notes.   HISTORY  Chief Complaint Shortness of Breath    HPI Whitney Bernard is a 58 y.o. female with medical issues as listed below who presents by EMS for evaluation of body aches, shortness of breath, mild cough, sore throat, and loss of smell and taste.  She did not initially inform EMS, but explained upon arrival to the ED that her husband and son have both been diagnosed with COVID-19 although they did not require hospitalization.  Her symptoms have been gradually worsening for about 4 5 days so she thought she would come be checked out.  She reports some discomfort when she takes deep breath although does not exactly chest pain but more of a pressure.  She has had some chills and subjective fever but no measured fever.  She denies nausea, vomiting, abdominal pain, and dysuria.  She describes her symptoms as mild to moderate and nothing particular makes them better or worse.         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   Loose stools    Diarrhea    Hypokalemia 12/24/2014   BP (high blood pressure) 02/23/2013   Misuse of prescription only drugs (HCC) 02/23/2013   Anxiety 06/22/2012   Colitis  05/11/2012   Clinical depression 05/11/2012   Hypercholesterolemia 05/11/2012   Gastroduodenal ulcer 03/23/2012    Past Surgical History:  Procedure Laterality Date   ANTERIOR CERVICAL DECOMP/DISCECTOMY FUSION N/A 11/26/2012   Procedure: ANTERIOR CERVICAL DECOMPRESSION/DISCECTOMY FUSION 2 LEVELS;  Surgeon: Reinaldo Meeker, MD;  Location: [facility];  Service: Neurosurgery;  Laterality: N/A;  ANTERIOR CERVICAL DECOMPRESSION/DISCECTOMY FUSION 2 LEVELS   CERVICAL POLYPECTOMY     CHOLECYSTECTOMY     ESOPHAGOGASTRODUODENOSCOPY N/A 12/26/2014   Procedure: ESOPHAGOGASTRODUODENOSCOPY (EGD);  Surgeon: Midge Minium, MD;  Location: [facility] ENDOSCOPY;  Service: Endoscopy;  Laterality: N/A;   FRACTURE SURGERY     NECK SURGERY     TUBAL LIGATION     WISDOM TOOTH EXTRACTION      Prior to Admission medications   Medication Sig Start Date End Date Taking? Authorizing Provider  ALPRAZolam Prudy Feeler) 0.5 MG tablet Take 1 tablet (0.5 mg total) by mouth at bedtime as needed for anxiety. 05/05/18   Irean Hong, MD  aspirin EC 81 MG EC tablet Take 1 tablet (81 mg total) by mouth daily. Patient not taking: Reported on 05/02/2018 08/25/17   Enedina Finner, MD  busPIRone (BUSPAR) 10 MG tablet Take 10 mg by mouth 2 (two) times daily.  04/26/18   [provider]  Butalbital-APAP-Caffeine 50-300-40 MG CAPS Take by mouth 3 (three) times daily as needed for headache.  04/26/18   [provider]  carisoprodol (SOMA)  350 MG tablet Take 350 mg by mouth 2 (two) times daily as needed for muscle spasms.     [provider]  D3-50 1.25 MG (50000 UT) capsule Take 50,000 Units by mouth every Tuesday. 01/26/18   [provider]  ezetimibe (ZETIA) 10 MG tablet Take 10 mg by mouth daily. 01/26/18   [provider]  furosemide (LASIX) 40 MG tablet Take 40 mg by mouth daily.    [provider]  HYDROcodone-acetaminophen (NORCO) 5-325 MG tablet Take 1 tablet by mouth every 6 (six)  hours as needed for up to 7 doses for severe pain. Patient not taking: Reported on 05/02/2018 12/18/17   Merrily Brittleifenbark, Neil, MD  oxyCODONE-acetaminophen (PERCOCET/ROXICET) 5-325 MG tablet Take 1 tablet by mouth every 4 (four) hours as needed for severe pain. 05/05/18   Irean HongSung, Jade J, MD  pantoprazole (PROTONIX) 40 MG tablet Take 1 tablet (40 mg total) by mouth daily. 05/04/18   Altamese DillingVachhani, Vaibhavkumar, MD  PARoxetine (PAXIL) 40 MG tablet Take 40 mg by mouth daily.    [provider]  potassium chloride SA (K-DUR,KLOR-CON) 20 MEQ tablet Take 20 mEq by mouth 3 (three) times daily.     [provider]    Allergies Almond oil and Statins  Family History  Problem Relation Age of Onset   CAD Father    Alzheimer's disease Other     Social History Social History   Tobacco Use   Smoking status: Former Smoker    Packs/day: 0.50    Years: 38.00    Pack years: 19.00    Types: Cigarettes    Quit date: 11/14/2012    Years since quitting: 5.9   Smokeless tobacco: Never Used  Substance Use Topics   Alcohol use: Not Currently    Comment: occ   Drug use: No    Review of Systems Constitutional: Subjective fever and chills.  Myalgias, malaise. Eyes: No visual changes. ENT: Mild sore throat, loss of smell and taste. Cardiovascular: Chest discomfort with deep inspiration. Respiratory: Some mild shortness of breath and mild cough. Gastrointestinal: No abdominal pain.  No nausea, no vomiting.  No diarrhea.  No constipation. Genitourinary: Negative for dysuria. Musculoskeletal: Negative for neck pain.  Negative for back pain. Integumentary: Negative for rash. Neurological: Negative for headaches, focal weakness or numbness.   ____________________________________________   PHYSICAL EXAM:  VITAL SIGNS: ED Triage Vitals  Enc Vitals Group     BP 11/02/18 0349 (!) 141/80     Pulse Rate 11/02/18 0350 84     Resp 11/02/18 0349 (!) 26     Temp 11/02/18 0404 98 F (36.7 C)      Temp Source 11/02/18 0404 Oral     SpO2 11/02/18 0350 98 %     Weight 11/02/18 0349 63.5 kg (140 lb)     Height 11/02/18 0349 1.626 m (5\' 4" )     Head Circumference --      Peak Flow --      Pain Score 11/02/18 0347 6     Pain Loc --      Pain Edu? --      Excl. in GC? --     Constitutional: Alert and oriented.  No acute distress, lying flat in the exam bed with no apparent discomfort. Eyes: Conjunctivae are normal.  Head: Atraumatic. Nose: No congestion/rhinnorhea. Mouth/Throat: Mucous membranes are moist. Neck: No stridor.  No meningeal signs.   Cardiovascular: Normal rate, regular rhythm. Good peripheral circulation. Grossly normal heart sounds. Respiratory:  Normal respiratory effort.  No retractions. Gastrointestinal: Obese, soft and nontender. No distention.  Musculoskeletal: No lower extremity tenderness nor edema. No gross deformities of extremities. Neurologic:  Normal speech and language. No gross focal neurologic deficits are appreciated.  Skin:  Skin is warm, dry and intact. Psychiatric: Mood and affect are normal. Speech and behavior are normal.  ____________________________________________   LABS (all labs ordered are listed, but only abnormal results are displayed)  Labs Reviewed  SARS CORONAVIRUS 2 (HOSPITAL ORDER, PERFORMED IN [facility]) - Abnormal; Notable for the following components:      Result Value   SARS Coronavirus 2 POSITIVE (*)    All other components within normal limits  COMPREHENSIVE METABOLIC PANEL - Abnormal; Notable for the following components:   CO2 20 (*)    Glucose, Bld 190 (*)    BUN 23 (*)    Creatinine, Ser 1.13 (*)    GFR calc non Af Amer 53 (*)    All other components within normal limits  CBC WITH DIFFERENTIAL/PLATELET  TROPONIN I (HIGH SENSITIVITY)  TROPONIN I (HIGH SENSITIVITY)   ____________________________________________  EKG  ED ECG REPORT I, Loleta Rose, the attending physician, personally viewed and  interpreted this ECG.  Date: 11/02/2018 EKG Time: 3:51 AM Rate: 83 Rhythm: normal sinus rhythm QRS Axis: normal Intervals: normal ST/T Wave abnormalities: normal Narrative Interpretation: no evidence of acute ischemia  ____________________________________________  RADIOLOGY I, Loleta Rose, personally viewed and evaluated these images (plain radiographs) as part of my medical decision making, as well as reviewing the written report by the radiologist.  ED MD interpretation: No acute abnormalities on chest x-ray  Official radiology report(s): Dg Chest Portable 1 View  Result Date: 11/02/2018 CLINICAL DATA:  Cough and shortness of breath EXAM: PORTABLE CHEST 1 VIEW COMPARISON:  05/05/2018 FINDINGS: Normal heart size for technique. Negative mediastinal contours. There is no edema, consolidation, effusion, or pneumothorax. Remote right proximal humerus fracture. ACDF. IMPRESSION: No evidence of active disease. Electronically Signed   By: Marnee Spring M.D.   On: 11/02/2018 04:57    ____________________________________________   PROCEDURES   Procedure(s) performed (including Critical Care):  Procedures   ____________________________________________   INITIAL IMPRESSION / MDM / ASSESSMENT AND PLAN / ED COURSE  As part of my medical decision making, I reviewed the following data within the electronic MEDICAL RECORD NUMBER Nursing notes reviewed and incorporated, Labs reviewed , EKG interpreted , Old chart reviewed, Radiograph reviewed , Notes from prior ED visits and [place] Controlled Substance Database   Differential diagnosis includes, but is not limited to, COVID-19, other nonspecific viral syndrome, pneumonia, much less likely ACS or PE.  The patient's vital signs are reassuring, no tachycardia, no hypoxemia, no increased respiratory rate.  She is comfortable and in no distress with a reassuringly normal physical exam.  EKG is normal and nonischemic.  Lab work and chest x-ray are  pending.  I had an extended discussion with the the patient about COVID-19.  I tried to provide reassurance and pointed out that neither her son nor her husband required hospitalization and that while she will likely feel bad for a couple of weeks, she should try to stay home, isolate herself from anyone else, and only return to the doctor or the emergency department if she is becoming significantly worse.  Given her prior history of smoking I offered to write her a prescription for an albuterol inhaler and she agreed with that plan.  I explained that if she has some  positive results on her chest x-ray suggestive of pneumonia that I will prescribe antibiotics, but otherwise she will need to treat this like a typical viral syndrome, stay hydrated, plenty of fluids, over-the-counter ibuprofen and Tylenol as needed.  She says that she understands.  I will update her regarding her lab results and chest x-ray results and plan for discharge but at this time she does not meet criteria for transfer to [facility] even if her COVID-19 test is positive.  If the test is negative I would strongly suspect a false negative given her close contacts although these contacts are self-reported and I cannot verify them.      Clinical Course as of Nov 01 616  Tue Nov 02, 2018  0505 Reassuring CMP  Comprehensive metabolic panel(!) [CF]  8588 Reassuring HS-troponin  Troponin I (High Sensitivity): 3 [CF]  0505 Normal CBC  CBC with Differential/Platelet [CF]  0505 No indication of acute abnormality on chest x-ray  DG Chest Portable 1 View [CF]  [phone] Patient left her room and went outside through the side door.  It is unclear why she went out but it appears she may have gone outside to smoke.   [CF]  7412 Her nurse Valetta Fuller is looking for her because she has an IV.   [CF]  0533 The patient was found in the lobby waiting for a ride.  IV was removed when the patient says she was leaving if we could not do anything for her.  I  do not yet have the results of her coronavirus testing but given her close family contacts I am quite confident the results will be positive.  She is refusing paperwork but I already had my verbal discussion with her about the importance of isolation and I had already given my usual and customary COVID-19 verbal instructions.  She also does not want to wait for the prescription I was going to provide for albuterol inhaler.   [CF]  0617 SARS Coronavirus 2(!): POSITIVE [CF]    Clinical Course User Index [CF] Hinda Kehr, MD     ____________________________________________  FINAL CLINICAL IMPRESSION(S) / ED DIAGNOSES  Final diagnoses:  Viral syndrome  Suspected Covid-19 Virus Infection     MEDICATIONS GIVEN DURING THIS VISIT:  Medications - No data to display   ED Discharge Orders    None      *Please note:  DEBBI STRANDBERG was evaluated in Emergency Department on 11/02/2018 for the symptoms described in the history of present illness. She was evaluated in the context of the global COVID-19 pandemic, which necessitated consideration that the patient might be at risk for infection with the SARS-CoV-2 virus that causes COVID-19. Institutional protocols and algorithms that pertain to the evaluation of patients at risk for COVID-19 are in a state of rapid change based on information released by regulatory bodies including the CDC and federal and state organizations. These policies and algorithms were followed during the patient's care in the ED.  Some ED evaluations and interventions may be delayed as a result of limited staffing during the pandemic.*  Note:  This document was prepared using Dragon voice recognition software and may include unintentional dictation errors.   Hinda Kehr, MD 11/02/18 [phone]

## 2018-11-02 NOTE — ED Notes (Signed)
At 0520 this RN responded to patient call bell and found room empty. Patient located in lobby attempting to call a cab to leave. Pt states "he said he's not going to do anything for me and I should have stayed home so I'm going home." IV removed by ED Tech and patient signed Clint paperwork. Charge RN Butch and Dr Karma Greaser notified.

## 2019-01-24 DIAGNOSIS — E7800 Pure hypercholesterolemia, unspecified: Secondary | ICD-10-CM | POA: Diagnosis not present

## 2019-01-24 DIAGNOSIS — E119 Type 2 diabetes mellitus without complications: Secondary | ICD-10-CM | POA: Diagnosis not present

## 2019-01-24 DIAGNOSIS — F329 Major depressive disorder, single episode, unspecified: Secondary | ICD-10-CM | POA: Diagnosis not present

## 2019-01-24 DIAGNOSIS — M542 Cervicalgia: Secondary | ICD-10-CM | POA: Diagnosis not present

## 2019-01-24 DIAGNOSIS — F419 Anxiety disorder, unspecified: Secondary | ICD-10-CM | POA: Diagnosis not present

## 2019-02-06 ENCOUNTER — Emergency Department: Payer: 59

## 2019-02-06 ENCOUNTER — Other Ambulatory Visit: Payer: Self-pay

## 2019-02-06 ENCOUNTER — Encounter: Payer: Self-pay | Admitting: Radiology

## 2019-02-06 ENCOUNTER — Emergency Department
Admission: EM | Admit: 2019-02-06 | Discharge: 2019-02-06 | Discharge status: Against Medical Advice | Payer: 59 | Attending: Emergency Medicine | Admitting: Emergency Medicine

## 2019-02-06 DIAGNOSIS — Z87891 Personal history of nicotine dependence: Secondary | ICD-10-CM | POA: Diagnosis not present

## 2019-02-06 DIAGNOSIS — R0602 Shortness of breath: Secondary | ICD-10-CM | POA: Diagnosis not present

## 2019-02-06 DIAGNOSIS — I10 Essential (primary) hypertension: Secondary | ICD-10-CM | POA: Diagnosis not present

## 2019-02-06 DIAGNOSIS — Z5320 Procedure and treatment not carried out because of patient's decision for unspecified reasons: Secondary | ICD-10-CM | POA: Diagnosis not present

## 2019-02-06 DIAGNOSIS — E119 Type 2 diabetes mellitus without complications: Secondary | ICD-10-CM | POA: Diagnosis not present

## 2019-02-06 DIAGNOSIS — R0789 Other chest pain: Secondary | ICD-10-CM | POA: Insufficient documentation

## 2019-02-06 DIAGNOSIS — R079 Chest pain, unspecified: Secondary | ICD-10-CM | POA: Diagnosis not present

## 2019-02-06 DIAGNOSIS — Z79899 Other long term (current) drug therapy: Secondary | ICD-10-CM | POA: Insufficient documentation

## 2019-02-06 DIAGNOSIS — J449 Chronic obstructive pulmonary disease, unspecified: Secondary | ICD-10-CM | POA: Diagnosis not present

## 2019-02-06 LAB — CBC WITH DIFFERENTIAL/PLATELET
Abs Immature Granulocytes: 0.04 10*3/uL (ref 0.00–0.07)
Basophils Absolute: 0.1 10*3/uL (ref 0.0–0.1)
Basophils Relative: 1 %
Eosinophils Absolute: 0.3 10*3/uL (ref 0.0–0.5)
Eosinophils Relative: 3 %
HCT: 37 % (ref 36.0–46.0)
Hemoglobin: 11.6 g/dL — ABNORMAL LOW (ref 12.0–15.0)
Immature Granulocytes: 0 %
Lymphocytes Relative: 29 %
Lymphs Abs: 2.7 10*3/uL (ref 0.7–4.0)
MCH: 28.4 pg (ref 26.0–34.0)
MCHC: 31.4 g/dL (ref 30.0–36.0)
MCV: 90.7 fL (ref 80.0–100.0)
Monocytes Absolute: 0.8 10*3/uL (ref 0.1–1.0)
Monocytes Relative: 8 %
Neutro Abs: 5.4 10*3/uL (ref 1.7–7.7)
Neutrophils Relative %: 59 %
Platelets: 332 10*3/uL (ref 150–400)
RBC: 4.08 MIL/uL (ref 3.87–5.11)
RDW: 13.8 % (ref 11.5–15.5)
WBC: 9.2 10*3/uL (ref 4.0–10.5)
nRBC: 0 % (ref 0.0–0.2)

## 2019-02-06 LAB — BASIC METABOLIC PANEL
Anion gap: 11 (ref 5–15)
BUN: 23 mg/dL — ABNORMAL HIGH (ref 6–20)
CO2: 19 mmol/L — ABNORMAL LOW (ref 22–32)
Calcium: 8.9 mg/dL (ref 8.9–10.3)
Chloride: 110 mmol/L (ref 98–111)
Creatinine, Ser: 1.24 mg/dL — ABNORMAL HIGH (ref 0.44–1.00)
GFR calc Af Amer: 55 mL/min — ABNORMAL LOW (ref >60)
GFR calc non Af Amer: 47 mL/min — ABNORMAL LOW (ref >60)
Glucose, Bld: 111 mg/dL — ABNORMAL HIGH (ref 70–99)
Potassium: 4.4 mmol/L (ref 3.5–5.1)
Sodium: 140 mmol/L (ref 135–145)

## 2019-02-06 LAB — TROPONIN I (HIGH SENSITIVITY): Troponin I (High Sensitivity): <2 ng/L (ref <18)

## 2019-02-06 LAB — FIBRIN DERIVATIVES D-DIMER (ARMC ONLY): Fibrin derivatives D-dimer (ARMC): 575.81 ng/mL (FEU) — ABNORMAL HIGH (ref 0.00–499.00)

## 2019-02-06 MED ORDER — OXYCODONE-ACETAMINOPHEN 5-325 MG PO TABS
1.0000 | ORAL_TABLET | Freq: Once | ORAL | Status: AC
  Administered 2019-02-06: 04:00:00 1 via ORAL
  Filled 2019-02-06: qty 1

## 2019-02-06 MED ORDER — IOHEXOL 350 MG/ML SOLN
75.0000 mL | Freq: Once | INTRAVENOUS | Status: AC | PRN
  Administered 2019-02-06: 75 mL via INTRAVENOUS

## 2019-02-06 MED ORDER — ACETAMINOPHEN 500 MG PO TABS
1000.0000 mg | ORAL_TABLET | Freq: Once | ORAL | Status: AC
  Administered 2019-02-06: 03:00:00 1000 mg via ORAL
  Filled 2019-02-06: qty 2

## 2019-02-06 MED ORDER — ALBUTEROL SULFATE HFA 108 (90 BASE) MCG/ACT IN AERS
2.0000 | INHALATION_SPRAY | Freq: Once | RESPIRATORY_TRACT | Status: DC
  Filled 2019-02-06: qty 6.7

## 2019-02-06 NOTE — ED Notes (Signed)
RN to room to administer tylenol for pain. Patient stated: "I might as well have just stayed at home. I have better meds there." Patient was asked by this RN did she not want to be evaluated for chest pain and shortness of breath, to which the patient stated: "Yeah, I guess."

## 2019-02-06 NOTE — ED Provider Notes (Signed)
[facility] Emergency Department Provider Note   ____________________________________________   First MD Initiated Contact with Patient 02/06/19 [phone]     (approximate)  I have reviewed the triage vital signs and the nursing notes.   HISTORY  Chief Complaint Chest Pain and Shortness of Breath    HPI Whitney Bernard is a 58 y.o. female with past medical history of hypertension, COPD, diabetes, and substance abuse presents to the ED complaining of chest pain and shortness of breath.  Patient reports sharp pain in the center of her chest for the past 3 to 4 hours which is associated with some shortness of breath.  She states the pain in her chest is relatively mild and she is more concerned about the difficulty breathing.  She does state that the pain seems to get worse when she takes a deep breath.  She has not had any recent fevers, cough, nausea, vomiting, abdominal pain, leg swelling or pain.  She admits to a history of COPD, but does not currently use any inhalers at home.        Past Medical History:  Diagnosis Date  . Acute pancreatitis   . Chronic pain syndrome   . COPD (chronic obstructive pulmonary disease) ([place])   . Depression   . Diabetes mellitus without complication ([place])   . Headache(784.0)    migraines  . Hypercholesteremia   . Hypertension   . Hyperthyroidism   . Hypokalemia   . Migraine   . Narcotic abuse ([place])   . RA (rheumatoid arthritis) [facility])     Patient Active Problem List   Diagnosis Date Noted  . Chest pain, non-cardiac 08/23/2017  . Protein-calorie malnutrition, severe 12/26/2014  . 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 Loose stools   . Diarrhea   . Hypokalemia 12/24/2014  . BP (high blood pressure) 02/23/2013  . Misuse of prescription only drugs 02/23/2013  . Anxiety 06/22/2012  . Colitis 05/11/2012  . Clinical depression 05/11/2012  . Hypercholesterolemia 05/11/2012  . Gastroduodenal ulcer 03/23/2012    Past  Surgical History:  Procedure Laterality Date  . ANTERIOR CERVICAL DECOMP/DISCECTOMY FUSION N/A 11/26/2012   Procedure: ANTERIOR CERVICAL DECOMPRESSION/DISCECTOMY FUSION 2 LEVELS;  Surgeon: Faythe Ghee, MD;  Location: [place] NEURO ORS;  Service: Neurosurgery;  Laterality: N/A;  ANTERIOR CERVICAL DECOMPRESSION/DISCECTOMY FUSION 2 LEVELS  . CERVICAL POLYPECTOMY    . CHOLECYSTECTOMY    . ESOPHAGOGASTRODUODENOSCOPY N/A 12/26/2014   Procedure: ESOPHAGOGASTRODUODENOSCOPY (EGD);  Surgeon: Lucilla Lame, MD;  Location: [facility] ENDOSCOPY;  Service: Endoscopy;  Laterality: N/A;  . FRACTURE SURGERY    . NECK SURGERY    . TUBAL LIGATION    . WISDOM TOOTH EXTRACTION      Prior to Admission medications   Medication Sig Start Date End Date Taking? Authorizing Provider  ALPRAZolam Duanne Moron) 0.5 MG tablet Take 1 tablet (0.5 mg total) by mouth at bedtime as needed for anxiety. 05/05/18   Paulette Blanch, MD  aspirin EC 81 MG EC tablet Take 1 tablet (81 mg total) by mouth daily. Patient not taking: Reported on 05/02/2018 08/25/17   Fritzi Mandes, MD  busPIRone (BUSPAR) 10 MG tablet Take 10 mg by mouth 2 (two) times daily.  04/26/18   [provider]  Butalbital-APAP-Caffeine 50-300-40 MG CAPS Take by mouth 3 (three) times daily as needed for headache.  04/26/18   [provider]  carisoprodol (SOMA) 350 MG tablet Take 350 mg by mouth 2 (two) times daily as needed for muscle spasms.  [provider]  D3-50 1.25 MG (50000 UT) capsule Take 50,000 Units by mouth every Tuesday. 01/26/18   [provider]  ezetimibe (ZETIA) 10 MG tablet Take 10 mg by mouth daily. 01/26/18   [provider]  furosemide (LASIX) 40 MG tablet Take 40 mg by mouth daily.    [provider]  HYDROcodone-acetaminophen (NORCO) 5-325 MG tablet Take 1 tablet by mouth every 6 (six) hours as needed for up to 7 doses for severe pain. Patient not taking: Reported on 05/02/2018 12/18/17   Merrily Brittle, MD    oxyCODONE-acetaminophen (PERCOCET/ROXICET) 5-325 MG tablet Take 1 tablet by mouth every 4 (four) hours as needed for severe pain. 05/05/18   Irean Hong, MD  pantoprazole (PROTONIX) 40 MG tablet Take 1 tablet (40 mg total) by mouth daily. 05/04/18   Altamese Dilling, MD  PARoxetine (PAXIL) 40 MG tablet Take 40 mg by mouth daily.    [provider]  potassium chloride SA (K-DUR,KLOR-CON) 20 MEQ tablet Take 20 mEq by mouth 3 (three) times daily.     [provider]    Allergies Almond oil and Statins  Family History  Problem Relation Age of Onset  . CAD Father   . Alzheimer's disease Other     Social History Social History   Tobacco Use  . Smoking status: Former Smoker    Packs/day: 0.50    Years: 38.00    Pack years: 19.00    Types: Cigarettes    Quit date: 11/14/2012    Years since quitting: 6.2  . Smokeless tobacco: Never Used  Substance Use Topics  . Alcohol use: Not Currently    Comment: occ  . Drug use: No    Review of Systems  Constitutional: No fever/chills Eyes: No visual changes. ENT: No sore throat. Cardiovascular: Positive for chest pain. Respiratory: Positive for shortness of breath. Gastrointestinal: No abdominal pain.  No nausea, no vomiting.  No diarrhea.  No constipation. Genitourinary: Negative for dysuria. Musculoskeletal: Negative for back pain. Skin: Negative for rash. Neurological: Negative for headaches, focal weakness or numbness.  ____________________________________________   PHYSICAL EXAM:  VITAL SIGNS: ED Triage Vitals  Enc Vitals Group     BP 02/06/19 0241 (!) 168/97     Pulse Rate 02/06/19 0241 68     Resp 02/06/19 0241 (!) 21     Temp 02/06/19 0241 98 F (36.7 C)     Temp Source 02/06/19 0241 Oral     SpO2 02/06/19 0241 100 %     Weight 02/06/19 0237 140 lb (63.5 kg)     Height 02/06/19 0237 5\' 4"  (1.626 m)     Head Circumference --      Peak Flow --      Pain Score 02/06/19 0237 6     Pain Loc --       Pain Edu? --      Excl. in GC? --     Constitutional: Alert and oriented. Eyes: Conjunctivae are normal. Head: Atraumatic. Nose: No congestion/rhinnorhea. Mouth/Throat: Mucous membranes are moist. Neck: Normal ROM Cardiovascular: Normal rate, regular rhythm. Grossly normal heart sounds. Respiratory: Normal respiratory effort.  No retractions. Lungs CTAB. Gastrointestinal: Soft and nontender. No distention. Genitourinary: deferred Musculoskeletal: No lower extremity tenderness nor edema. Neurologic:  Normal speech and language. No gross focal neurologic deficits are appreciated. Skin:  Skin is warm, dry and intact. No rash noted. Psychiatric: Mood and affect are normal. Speech and behavior are normal.  ____________________________________________   LABS (  all labs ordered are listed, but only abnormal results are displayed)  Labs Reviewed  BASIC METABOLIC PANEL - Abnormal; Notable for the following components:      Result Value   CO2 19 (*)    Glucose, Bld 111 (*)    BUN 23 (*)    Creatinine, Ser 1.24 (*)    GFR calc non Af Amer 47 (*)    GFR calc Af Amer 55 (*)    All other components within normal limits  CBC WITH DIFFERENTIAL/PLATELET - Abnormal; Notable for the following components:   Hemoglobin 11.6 (*)    All other components within normal limits  FIBRIN DERIVATIVES D-DIMER ([facility] ONLY) - Abnormal; Notable for the following components:   Fibrin derivatives D-dimer ([facility]) 575.81 (*)    All other components within normal limits  TROPONIN I (HIGH SENSITIVITY)  TROPONIN I (HIGH SENSITIVITY)   ____________________________________________  EKG  ED ECG REPORT I, Chesley Noon, the attending physician, personally viewed and interpreted this ECG.   Date: 02/06/2019  EKG Time: 2:37  Rate: 66  Rhythm: normal sinus rhythm  Axis: Normal  Intervals:none  ST&T Change: None   PROCEDURES  Procedure(s) performed (including Critical  Care):  Procedures   ____________________________________________   INITIAL IMPRESSION / ASSESSMENT AND PLAN / ED COURSE       57 year old female with history of COPD presents to the ED with sharp pleuritic pain in the center of her chest as well as difficulty breathing for the past 4 to 5 hours.  EKG shows no acute ischemic changes with normal sinus rhythm.  She is in no respiratory distress with clear lungs on auscultation.  She is requesting medication to help her breathe, will give dose of albuterol but no indication for steroids.  We check basic labs, troponin, chest x-ray.  Also given relative acute onset of symptoms as well as pleuritic pain, check D-dimer.  If this is negative, we can rule out PE as she is low risk by [place].  D-dimer was elevated, CTA obtained and is negative for PE or other acute process.  Lab work is unremarkable and initial troponin is negative.  Patient is now refusing to stay for further work-up including repeat troponin.  I explained to her that this means we will be unable to rule out cardiac etiology of her symptoms and there is significant risk of disability or death given her work-up is not completed.  Patient expresses understanding and still wishes to leave, she will sign out AGAINST MEDICAL ADVICE.  Counseled patient to follow-up with her PCP and return to the ED for new or worsening symptoms.     ____________________________________________   FINAL CLINICAL IMPRESSION(S) / ED DIAGNOSES  Final diagnoses:  Atypical chest pain  Shortness of breath     ED Discharge Orders    None       Note:  This document was prepared using Dragon voice recognition software and may include unintentional dictation errors.   Chesley Noon, MD 02/06/19 [phone]

## 2019-02-06 NOTE — ED Notes (Signed)
Patient transported to X-ray 

## 2019-02-06 NOTE — ED Triage Notes (Signed)
Patient to rm 2 from home via EMS.  Per EMS patient reporting chest pain for past 3-4 hours.  Patient reports also short of breath most of Saturday.  EMS reports pain increased with deep breathing.  EMS interventions:  Temp 98.0 (oral), HR 68, BP 168/85, pulse oxi 99% on room air.  Saline loc to right antecub via 20g angiocath.

## 2019-02-06 NOTE — ED Notes (Signed)
Registration personal informed this RN that patient requesting IV to be removed and immediate discharge. RN and MD to room. MD encouraged patient to stay, discussing the need for a second troponin. Patient refused. Patient informed she would be leaving AMA. Patient verbalized understanding. RN and MD discussed the dangers of leaving without further treatment/assessment. Patient verbalized understanding. Patient's IV removed by this RN. Patient refusing vital signs. Patient left.

## 2019-02-06 NOTE — ED Notes (Signed)
Patient transported to CT 

## 2019-02-22 ENCOUNTER — Encounter: Payer: Self-pay | Admitting: Emergency Medicine

## 2019-02-22 ENCOUNTER — Other Ambulatory Visit: Payer: Self-pay

## 2019-02-22 ENCOUNTER — Inpatient Hospital Stay
Admission: EM | Admit: 2019-02-22 | Discharge: 2019-02-25 | DRG: 247 | Disposition: A | Discharge status: Home / Self Care | Payer: 59 | Attending: Internal Medicine | Admitting: Internal Medicine

## 2019-02-22 ENCOUNTER — Emergency Department: Payer: 59

## 2019-02-22 DIAGNOSIS — R079 Chest pain, unspecified: Secondary | ICD-10-CM | POA: Diagnosis present

## 2019-02-22 DIAGNOSIS — J449 Chronic obstructive pulmonary disease, unspecified: Secondary | ICD-10-CM | POA: Diagnosis present

## 2019-02-22 DIAGNOSIS — Z9049 Acquired absence of other specified parts of digestive tract: Secondary | ICD-10-CM

## 2019-02-22 DIAGNOSIS — R42 Dizziness and giddiness: Secondary | ICD-10-CM | POA: Diagnosis not present

## 2019-02-22 DIAGNOSIS — I10 Essential (primary) hypertension: Secondary | ICD-10-CM | POA: Diagnosis not present

## 2019-02-22 DIAGNOSIS — R0789 Other chest pain: Secondary | ICD-10-CM | POA: Diagnosis not present

## 2019-02-22 DIAGNOSIS — J432 Centrilobular emphysema: Secondary | ICD-10-CM | POA: Diagnosis not present

## 2019-02-22 DIAGNOSIS — I213 ST elevation (STEMI) myocardial infarction of unspecified site: Secondary | ICD-10-CM | POA: Diagnosis present

## 2019-02-22 DIAGNOSIS — G894 Chronic pain syndrome: Secondary | ICD-10-CM | POA: Diagnosis present

## 2019-02-22 DIAGNOSIS — F418 Other specified anxiety disorders: Secondary | ICD-10-CM | POA: Diagnosis present

## 2019-02-22 DIAGNOSIS — R778 Other specified abnormalities of plasma proteins: Secondary | ICD-10-CM

## 2019-02-22 DIAGNOSIS — I214 Non-ST elevation (NSTEMI) myocardial infarction: Secondary | ICD-10-CM

## 2019-02-22 DIAGNOSIS — F329 Major depressive disorder, single episode, unspecified: Secondary | ICD-10-CM | POA: Diagnosis present

## 2019-02-22 DIAGNOSIS — I129 Hypertensive chronic kidney disease with stage 1 through stage 4 chronic kidney disease, or unspecified chronic kidney disease: Secondary | ICD-10-CM | POA: Diagnosis present

## 2019-02-22 DIAGNOSIS — Z91018 Allergy to other foods: Secondary | ICD-10-CM

## 2019-02-22 DIAGNOSIS — M069 Rheumatoid arthritis, unspecified: Secondary | ICD-10-CM | POA: Diagnosis present

## 2019-02-22 DIAGNOSIS — E1122 Type 2 diabetes mellitus with diabetic chronic kidney disease: Secondary | ICD-10-CM | POA: Diagnosis present

## 2019-02-22 DIAGNOSIS — E7800 Pure hypercholesterolemia, unspecified: Secondary | ICD-10-CM

## 2019-02-22 DIAGNOSIS — I2510 Atherosclerotic heart disease of native coronary artery without angina pectoris: Secondary | ICD-10-CM | POA: Diagnosis not present

## 2019-02-22 DIAGNOSIS — Z8249 Family history of ischemic heart disease and other diseases of the circulatory system: Secondary | ICD-10-CM

## 2019-02-22 DIAGNOSIS — Z8616 Personal history of COVID-19: Secondary | ICD-10-CM | POA: Diagnosis present

## 2019-02-22 DIAGNOSIS — D649 Anemia, unspecified: Secondary | ICD-10-CM | POA: Diagnosis not present

## 2019-02-22 DIAGNOSIS — E1129 Type 2 diabetes mellitus with other diabetic kidney complication: Secondary | ICD-10-CM | POA: Diagnosis present

## 2019-02-22 DIAGNOSIS — Z20822 Contact with and (suspected) exposure to covid-19: Secondary | ICD-10-CM | POA: Diagnosis not present

## 2019-02-22 DIAGNOSIS — E039 Hypothyroidism, unspecified: Secondary | ICD-10-CM | POA: Diagnosis present

## 2019-02-22 DIAGNOSIS — N1831 Chronic kidney disease, stage 3a: Secondary | ICD-10-CM | POA: Diagnosis not present

## 2019-02-22 DIAGNOSIS — F1110 Opioid abuse, uncomplicated: Secondary | ICD-10-CM | POA: Diagnosis present

## 2019-02-22 DIAGNOSIS — Z79899 Other long term (current) drug therapy: Secondary | ICD-10-CM | POA: Diagnosis not present

## 2019-02-22 DIAGNOSIS — E875 Hyperkalemia: Secondary | ICD-10-CM | POA: Diagnosis present

## 2019-02-22 DIAGNOSIS — N183 Chronic kidney disease, stage 3 unspecified: Secondary | ICD-10-CM | POA: Diagnosis not present

## 2019-02-22 DIAGNOSIS — E785 Hyperlipidemia, unspecified: Secondary | ICD-10-CM | POA: Diagnosis present

## 2019-02-22 DIAGNOSIS — Z716 Tobacco abuse counseling: Secondary | ICD-10-CM

## 2019-02-22 DIAGNOSIS — Z981 Arthrodesis status: Secondary | ICD-10-CM

## 2019-02-22 DIAGNOSIS — K219 Gastro-esophageal reflux disease without esophagitis: Secondary | ICD-10-CM | POA: Diagnosis not present

## 2019-02-22 DIAGNOSIS — Z720 Tobacco use: Secondary | ICD-10-CM

## 2019-02-22 DIAGNOSIS — I209 Angina pectoris, unspecified: Secondary | ICD-10-CM | POA: Diagnosis present

## 2019-02-22 DIAGNOSIS — D509 Iron deficiency anemia, unspecified: Secondary | ICD-10-CM | POA: Diagnosis not present

## 2019-02-22 DIAGNOSIS — Z7982 Long term (current) use of aspirin: Secondary | ICD-10-CM | POA: Diagnosis not present

## 2019-02-22 DIAGNOSIS — R000 Tachycardia, unspecified: Secondary | ICD-10-CM | POA: Diagnosis not present

## 2019-02-22 DIAGNOSIS — R7989 Other specified abnormal findings of blood chemistry: Secondary | ICD-10-CM | POA: Diagnosis present

## 2019-02-22 DIAGNOSIS — Z888 Allergy status to other drugs, medicaments and biological substances status: Secondary | ICD-10-CM

## 2019-02-22 DIAGNOSIS — Z23 Encounter for immunization: Secondary | ICD-10-CM

## 2019-02-22 LAB — CBC
HCT: 44.4 % (ref 36.0–46.0)
Hemoglobin: 13.9 g/dL (ref 12.0–15.0)
MCH: 28.5 pg (ref 26.0–34.0)
MCHC: 31.3 g/dL (ref 30.0–36.0)
MCV: 91 fL (ref 80.0–100.0)
Platelets: 409 10*3/uL — ABNORMAL HIGH (ref 150–400)
RBC: 4.88 MIL/uL (ref 3.87–5.11)
RDW: 13.9 % (ref 11.5–15.5)
WBC: 14 10*3/uL — ABNORMAL HIGH (ref 4.0–10.5)
nRBC: 0 % (ref 0.0–0.2)

## 2019-02-22 LAB — BASIC METABOLIC PANEL
Anion gap: 12 (ref 5–15)
BUN: 34 mg/dL — ABNORMAL HIGH (ref 6–20)
CO2: 22 mmol/L (ref 22–32)
Calcium: 9.7 mg/dL (ref 8.9–10.3)
Chloride: 104 mmol/L (ref 98–111)
Creatinine, Ser: 1.38 mg/dL — ABNORMAL HIGH (ref 0.44–1.00)
GFR calc Af Amer: 48 mL/min — ABNORMAL LOW (ref >60)
GFR calc non Af Amer: 41 mL/min — ABNORMAL LOW (ref >60)
Glucose, Bld: 175 mg/dL — ABNORMAL HIGH (ref 70–99)
Potassium: 5.4 mmol/L — ABNORMAL HIGH (ref 3.5–5.1)
Sodium: 138 mmol/L (ref 135–145)

## 2019-02-22 LAB — BRAIN NATRIURETIC PEPTIDE: B Natriuretic Peptide: 83 pg/mL (ref 0.0–100.0)

## 2019-02-22 LAB — TROPONIN I (HIGH SENSITIVITY)
Troponin I (High Sensitivity): 180 ng/L (ref <18)
Troponin I (High Sensitivity): 1076 ng/L (ref <18)
Troponin I (High Sensitivity): 1519 ng/L (ref <18)
Troponin I (High Sensitivity): 1667 ng/L (ref <18)

## 2019-02-22 LAB — PROTIME-INR
INR: 1 (ref 0.8–1.2)
Prothrombin Time: 13.4 seconds (ref 11.4–15.2)

## 2019-02-22 LAB — HIV ANTIBODY (ROUTINE TESTING W REFLEX): HIV Screen 4th Generation wRfx: NONREACTIVE

## 2019-02-22 LAB — APTT: aPTT: 29 seconds (ref 24–36)

## 2019-02-22 MED ORDER — HEPARIN BOLUS VIA INFUSION
3900.0000 [IU] | Freq: Once | INTRAVENOUS | Status: AC
  Administered 2019-02-22: 3900 [IU] via INTRAVENOUS
  Filled 2019-02-22: qty 3900

## 2019-02-22 MED ORDER — SODIUM CHLORIDE 0.9 % IV SOLN: INTRAVENOUS | Status: DC

## 2019-02-22 MED ORDER — SODIUM ZIRCONIUM CYCLOSILICATE 10 G PO PACK
10.0000 g | PACK | Freq: Once | ORAL | Status: AC
  Administered 2019-02-22: 10 g via ORAL
  Filled 2019-02-22: qty 1

## 2019-02-22 MED ORDER — METOPROLOL TARTRATE 25 MG PO TABS
12.5000 mg | ORAL_TABLET | Freq: Two times a day (BID) | ORAL | Status: DC
  Administered 2019-02-22 – 2019-02-23 (×3): 12.5 mg via ORAL
  Filled 2019-02-22 (×3): qty 1

## 2019-02-22 MED ORDER — ALBUTEROL SULFATE HFA 108 (90 BASE) MCG/ACT IN AERS
2.0000 | INHALATION_SPRAY | RESPIRATORY_TRACT | Status: DC | PRN
  Filled 2019-02-22: qty 6.7

## 2019-02-22 MED ORDER — FUROSEMIDE 40 MG PO TABS: 40.0000 mg | ORAL_TABLET | Freq: Every day | ORAL | Status: DC

## 2019-02-22 MED ORDER — SODIUM CHLORIDE 0.9% FLUSH
3.0000 mL | Freq: Once | INTRAVENOUS | Status: AC
  Administered 2019-02-22: 3 mL via INTRAVENOUS

## 2019-02-22 MED ORDER — NITROGLYCERIN 2 % TD OINT
1.0000 [in_us] | TOPICAL_OINTMENT | Freq: Four times a day (QID) | TRANSDERMAL | Status: DC
  Administered 2019-02-22 – 2019-02-25 (×9): 1 [in_us] via TOPICAL
  Filled 2019-02-22 (×10): qty 1

## 2019-02-22 MED ORDER — ASPIRIN EC 81 MG PO TBEC
81.0000 mg | DELAYED_RELEASE_TABLET | Freq: Every day | ORAL | Status: DC
  Administered 2019-02-22 – 2019-02-25 (×4): 81 mg via ORAL
  Filled 2019-02-22 (×4): qty 1

## 2019-02-22 MED ORDER — HYDRALAZINE HCL 25 MG PO TABS: 25.0000 mg | ORAL_TABLET | Freq: Three times a day (TID) | ORAL | Status: DC | PRN

## 2019-02-22 MED ORDER — ONDANSETRON HCL 4 MG/2ML IJ SOLN
4.0000 mg | Freq: Three times a day (TID) | INTRAMUSCULAR | Status: DC | PRN
  Administered 2019-02-22: 4 mg via INTRAVENOUS
  Filled 2019-02-22: qty 2

## 2019-02-22 MED ORDER — HEPARIN (PORCINE) 25000 UT/250ML-% IV SOLN
1050.0000 [IU]/h | INTRAVENOUS | Status: DC
  Administered 2019-02-22: 750 [IU]/h via INTRAVENOUS
  Filled 2019-02-22: qty 250

## 2019-02-22 MED ORDER — NITROGLYCERIN 0.4 MG SL SUBL: 0.4000 mg | SUBLINGUAL_TABLET | SUBLINGUAL | Status: DC | PRN

## 2019-02-22 MED ORDER — ACETAMINOPHEN 325 MG PO TABS: 650.0000 mg | ORAL_TABLET | Freq: Four times a day (QID) | ORAL | Status: DC | PRN

## 2019-02-22 MED ORDER — MORPHINE SULFATE (PF) 2 MG/ML IV SOLN
2.0000 mg | INTRAVENOUS | Status: DC | PRN
  Administered 2019-02-22 – 2019-02-25 (×10): 2 mg via INTRAVENOUS
  Filled 2019-02-22 (×10): qty 1

## 2019-02-22 MED ORDER — BUTALBITAL-APAP-CAFFEINE 50-300-40 MG PO CAPS
1.0000 | ORAL_CAPSULE | Freq: Three times a day (TID) | ORAL | Status: DC | PRN
  Filled 2019-02-22 (×4): qty 1

## 2019-02-22 MED ORDER — BUSPIRONE HCL 10 MG PO TABS
10.0000 mg | ORAL_TABLET | Freq: Two times a day (BID) | ORAL | Status: DC
  Administered 2019-02-22 – 2019-02-25 (×6): 10 mg via ORAL
  Filled 2019-02-22 (×3): qty 1
  Filled 2019-02-22: qty 2
  Filled 2019-02-22 (×3): qty 1

## 2019-02-22 MED ORDER — NITROGLYCERIN 0.4 MG SL SUBL
0.4000 mg | SUBLINGUAL_TABLET | Freq: Once | SUBLINGUAL | Status: AC
  Administered 2019-02-22: 0.4 mg via SUBLINGUAL
  Filled 2019-02-22: qty 1

## 2019-02-22 MED ORDER — PAROXETINE HCL 20 MG PO TABS
40.0000 mg | ORAL_TABLET | Freq: Every day | ORAL | Status: DC
  Administered 2019-02-22 – 2019-02-25 (×4): 40 mg via ORAL
  Filled 2019-02-22 (×4): qty 2

## 2019-02-22 MED ORDER — PANTOPRAZOLE SODIUM 40 MG PO TBEC
40.0000 mg | DELAYED_RELEASE_TABLET | Freq: Every day | ORAL | Status: DC
  Administered 2019-02-22 – 2019-02-25 (×4): 40 mg via ORAL
  Filled 2019-02-22 (×4): qty 1

## 2019-02-22 NOTE — Consult Note (Signed)
Cardiology Consultation:   Patient ID: Whitney Bernard; [id number]; [birth year]   Admit date: 02/22/2019 Date of Consult: 02/22/2019  Primary Care Provider: Evelene Croon, MD Primary Cardiologist: [facility]   Patient Profile:   Whitney Bernard is a 59 y.o. female with a hx of diabetes, hypertension, hyperlipidemia, hypothyroidism, chronic pain disorder with narcotic abuse, CKD stage II-III, COVID-19 infection diagnosed in 10/2018, rheumatoid arthritis, migraine disorder, COPD secondary to prior tobacco abuse, colitis, diverticulitis and depression who  who is being seen today for the evaluation of elevated troponin at the request of Dr. Clyde Lundborg.  History of Present Illness:   Whitney Bernard was previously admitted to the hospital in 08/2017 with chest pain and ruled out.  Lexiscan Myoview showed no significant ischemia with an EF of 55 to 65%.  Overall, this was a low risk study and her chest pain was felt to be noncardiac.  She was admitted to the hospital in 04/2018 with chest pain and shortness of breath that was worse with deep inspiration, reproducible to palpation, and occurring at rest.  Cardiac enzymes were negative.  Echo demonstrated preserved LV systolic function with no acute abnormalities as outlined below.    She was diagnosed with COVID-19 in 10/2018.  She did not require inpatient admission.  She was seen in the ED recently on 02/06/2019 with chest pain with an initial high-sensitivity troponin of less than 2.  D-dimer was elevated with CTA chest being negative for PE.  Patient indicates she woke up around 6 this morning with substernal chest pressure that radiated to her neck and bilateral shoulders.  Pain was 9 out of 10 and lasted for 1 to 2 hours prior to improving without intervention.  She was pain-free for approximately 60 minutes when the pain returned and has been persistent since prompting her to come to the ED.  She denies any associated shortness of breath, palpitations, dizziness,  presyncope, syncope, nausea, vomiting, lower extremity swelling, abdominal surgeon, orthopnea, PND, early satiety.  She negates this pain does not feel similar to her prior episodes of pain during her above stress test or admission in 04/2018 for reproducible chest pain.  Upon the patient's arrival to [facility] they were found to have BP ranging from the 120s to 150s systolic with heart rates ranging from the 90s to low 100s bpm.she is afebrile and has oxygen saturations in the mid to upper 90s percent on room air. EKG showed sinus rhythm with possible prior inferior infarct with subtle inferior ST elevation MI not meeting STEMI criteria with inferior T wave inversion with changes being new when compared to prior study, CXR showed stable basilar and lingular atelectatic changes. Labs showed an initial high-sensitivity troponin of 180 with a delta pending at this time, WBC 14.0, Hgb 13.9, PLT 409, potassium 5.4, BUN 34, serum creatinine 1.38.  COVID-19, BNP, and urine drug screen pending.  In the ED she was given sublingual nitro with some improvement in chest pain.  Upon admission she was placed on heparin drip and cardiology was asked to evaluate.  Currently, she continues to note substernal discomfort rated a 7 out of 10.  Past Medical History:  Diagnosis Date   Acute pancreatitis    Chronic pain syndrome    COPD (chronic obstructive pulmonary disease) ([facility])    Depression    Diabetes mellitus without complication ([facility])    Headache(784.0)    migraines   Hypercholesteremia    Hypertension    Hyperthyroidism    Hypokalemia  Migraine    Narcotic abuse ([facility])    RA (rheumatoid arthritis) ([facility])     Past Surgical History:  Procedure Laterality Date   ANTERIOR CERVICAL DECOMP/DISCECTOMY FUSION N/A 11/26/2012   Procedure: ANTERIOR CERVICAL DECOMPRESSION/DISCECTOMY FUSION 2 LEVELS;  Surgeon: Reinaldo Meeker, MD;  Location: [facility] NEURO ORS;  Service: Neurosurgery;  Laterality: N/A;  ANTERIOR  CERVICAL DECOMPRESSION/DISCECTOMY FUSION 2 LEVELS   CERVICAL POLYPECTOMY     CHOLECYSTECTOMY     ESOPHAGOGASTRODUODENOSCOPY N/A 12/26/2014   Procedure: ESOPHAGOGASTRODUODENOSCOPY (EGD);  Surgeon: Midge Minium, MD;  Location: [facility] END[facility]COPY;  Service: Endoscopy;  Laterality: N/A;   FRACTURE SURGERY     NECK SURGERY     TUBAL LIGATION     WISDOM TOOTH EXTRACTION       Home Meds: Prior to Admission medications   Medication Sig Start Date End Date Taking? Authorizing Provider  busPIRone (BUSPAR) 10 MG tablet Take 10 mg by mouth 2 (two) times daily.  04/26/18  Yes [provider]  Butalbital-APAP-Caffeine 50-300-40 MG CAPS Take by mouth 3 (three) times daily as needed for headache.  04/26/18  Yes [provider]  furosemide (LASIX) 40 MG tablet Take 40 mg by mouth daily.   Yes [provider]  omeprazole (PRILOSEC) 20 MG capsule Take 20 mg by mouth daily.   Yes [provider]  PARoxetine (PAXIL) 40 MG tablet Take 40 mg by mouth daily.   Yes [provider]  potassium chloride SA (K-DUR,KLOR-CON) 20 MEQ tablet Take 20 mEq by mouth 3 (three) times daily.    Yes [provider]    Inpatient Medications: Scheduled Meds:  aspirin EC  81 mg Oral Daily   heparin  3,900 Units Intravenous Once   sodium zirconium cyclosilicate  10 g Oral Once   Continuous Infusions:  heparin     PRN Meds: acetaminophen, hydrALAZINE, morphine injection, nitroGLYCERIN, ondansetron (ZOFRAN) IV  Allergies:   Allergies  Allergen Reactions   Almond Oil Hives   Statins Other (See Comments)    Multiple statins    Social History:   Social History   Socioeconomic History   Marital status: Married    Spouse name: Not on file   Number of children: Not on file   Years of education: Not on file   Highest education level: Not on file  Occupational History   Not on file  Tobacco Use   Smoking status: Former Smoker    Packs/day: 0.50     Years: 38.00    Pack years: 19.00    Types: Cigarettes    Quit date: 11/14/2012    Years since quitting: 6.2   Smokeless tobacco: Never Used  Substance and Sexual Activity   Alcohol use: Not Currently    Comment: occ   Drug use: No   Sexual activity: Yes    Birth control/protection: Surgical  Other Topics Concern   Not on file  Social History Narrative   Not on file   Social Determinants of Health   Financial Resource Strain:    Difficulty of Paying Living Expenses: Not on file  Food Insecurity:    Worried About Running Out of Food in the Last Year: Not on file   [employer] of Food in the Last Year: Not on file  Transportation Needs:    Lack of Transportation (Medical): Not on file   Lack of Transportation (Non-Medical): Not on file  Physical Activity:    Days of Exercise per Week: Not on file   Minutes  of Exercise per Session: Not on file  Stress:    Feeling of Stress : Not on file  Social Connections:    Frequency of Communication with Friends and Family: Not on file   Frequency of Social Gatherings with Friends and Family: Not on file   Attends Religious Services: Not on file   Active Member of Clubs or Organizations: Not on file   Attends Banker Meetings: Not on file   Marital Status: Not on file  Intimate Partner Violence:    Fear of Current or Ex-Partner: Not on file   Emotionally Abused: Not on file   Physically Abused: Not on file   Sexually Abused: Not on file     Family History:   Family History  Problem Relation Age of Onset   CAD Father    Alzheimer's disease Other     ROS:  Review of Systems  Constitutional: Positive for malaise/fatigue. Negative for chills, diaphoresis, fever and weight loss.  HENT: Negative for congestion.   Eyes: Negative for discharge and redness.  Respiratory: Negative for cough, hemoptysis, sputum production, shortness of breath and wheezing.   Cardiovascular: Positive for chest pain.  Negative for palpitations, orthopnea, claudication, leg swelling and PND.  Gastrointestinal: Positive for abdominal pain. Negative for blood in stool, heartburn, melena, nausea and vomiting.  Genitourinary: Negative for hematuria.  Musculoskeletal: Negative for falls and myalgias.  Skin: Negative for rash.  Neurological: Positive for weakness. Negative for dizziness, tingling, tremors, sensory change, speech change, focal weakness and loss of consciousness.  Endo/Heme/Allergies: Does not bruise/bleed easily.  Psychiatric/Behavioral: Negative for substance abuse. The patient is not nervous/anxious.   All other systems reviewed and are negative.     Physical Exam/Data:   Vitals:   02/22/19 1244 02/22/19 1345 02/22/19 1400  BP: 129/89 (!) 151/101 131/89  Pulse: (!) 108 94 (!) 102  Resp: 16 17 14   Temp: 98.9 F (37.2 C)    TempSrc: Oral    SpO2: 98% 95% 94%  Weight: 65.8 kg    Height: 5\' 4"  (1.626 m)     No intake or output data in the 24 hours ending 02/22/19 1528 Filed Weights   02/22/19 1244  Weight: 65.8 kg   Body mass index is 24.89 kg/m.   Physical Exam: General: Well developed, well nourished, in no acute distress. Head: Normocephalic, atraumatic, sclera non-icteric, no xanthomas, nares without discharge.  Neck: Negative for carotid bruits. JVD not elevated. Lungs: Clear bilaterally to auscultation without wheezes, rales, or rhonchi. Breathing is unlabored. Heart: RRR with S1 S2. No murmurs, rubs, or gallops appreciated. Abdomen: Soft, non-tender, non-distended with normoactive bowel sounds. No hepatomegaly. No rebound/guarding. No obvious abdominal masses. Msk:  Strength and tone appear normal for age. Extremities: No clubbing or cyanosis. No edema. Distal pedal pulses are 2+ and equal bilaterally. Neuro: Alert and oriented X 3. No facial asymmetry. No focal deficit. Moves all extremities spontaneously. Psych:  Responds to questions appropriately with a normal  affect.   EKG:  The EKG was personally reviewed and demonstrates: NSR, 98 bpm, possible prior inferior infarct age undetermined, subtle inferior ST elevation not meeting STEMI criteria with inferior T wave inversion (changes are new when compared to prior study) Telemetry:  Telemetry was personally reviewed and demonstrates: Sinus rhythm to sinus tachycardia with rates in the 90s to 120s bpm  Weights: Filed Weights   02/22/19 1244  Weight: 65.8 kg    Relevant CV Studies:  2D echo 05/02/2018: 1. The left ventricle  has normal systolic function, with an ejection fraction of 55-60%. The cavity size was normal. Left ventricular diastolic Doppler parameters are indeterminate.  2. The right ventricle has normal systolic function. The cavity was normal. There is no increase in right ventricular wall thickness.  3. The mitral valve is grossly normal.  4. The tricuspid valve is grossly normal.  5. The aortic valve is grossly normal Aortic valve regurgitation was not assessed by color flow Doppler. ___________  Leane Call 08/24/2017:  This is a low risk study.  The study is normal.  The left ventricular ejection fraction is normal (55-65%).  There was no ST segment deviation noted during stress.   Negative Lexiscan stress LV function normal Low risk study   Laboratory Data:  Chemistry Recent Labs  Lab 02/22/19 1250  NA 138  K 5.4*  CL 104  CO2 22  GLUCOSE 175*  BUN 34*  CREATININE 1.38*  CALCIUM 9.7  GFRNONAA 41*  GFRAA 48*  ANIONGAP 12    No results for input(s): PROT, ALBUMIN, AST, ALT, ALKPHOS, BILITOT in the last 168 hours. Hematology Recent Labs  Lab 02/22/19 1250  WBC 14.0*  RBC 4.88  HGB 13.9  HCT 44.4  MCV 91.0  MCH 28.5  MCHC 31.3  RDW 13.9  PLT 409*   Cardiac EnzymesNo results for input(s): TROPONINI in the last 168 hours. No results for input(s): TROPIPOC in the last 168 hours.  BNPNo results for input(s): BNP, PROBNP in the last 168 hours.   DDimer No results for input(s): DDIMER in the last 168 hours.  Radiology/Studies:  DG Chest 2 View  Result Date: 02/22/2019 IMPRESSION: Areas of basilar and lingular atelectatic change, essentially stable. No evidence of edema or consolidation. Stable cardiac silhouette. No evident adenopathy. Electronically Signed   By: Lowella Grip III M.D.   On: 02/22/2019 13:12    Assessment and Plan:   1.  NSTEMI: -Initial troponin 180 with delta pending, continue to cycle until peak -Continues to note 7 out of 10 chest pain -Nitropaste in an effort to achieve pain-free status, if pain worsens may need to use nitro drip or proceed with urgent LHC -Morphine as needed -ASA -Heparin drip -LHC with Dr. Saunders Revel 02/23/2019 at 11:30 AM -Echo -Check lipid panel and A1c for further risk ratification -UDS pending -Risks and benefits of cardiac catheterization have been discussed with the patient including risks of bleeding, bruising, infection, kidney damage, stroke, heart attack, urgent need for bypass, injury to a limb, and death. The patient understands these risks and is willing to proceed with the procedure. All questions have been answered and concerns listened to  2.  CKD stage II-III: -Renal function indicating she may be slightly dry -Gentle IV fluids precath -Monitor renal function post-cath -Recommend holding Lasix   3.  HTN: -Reasonably controlled with slight elevation likely in setting of the above -Nitropaste as above -Placed on hydralazine at time of admission  4.  HLD: -Check lipid panel -Intolerant to statin -Has previously been on Zetia though it does not appear she is continuing to take this  5.  Tobacco use: -Complete cessation is recommended   For questions or updates, please contact [place] Please consult [email or web address] for contact info under Cardiology/STEMI.   Signed, Christell Faith, PA-C [place] Pager: [phone] 02/22/2019, 3:28 PM

## 2019-02-22 NOTE — ED Provider Notes (Signed)
[facility] Emergency Department Provider Note   ____________________________________________    I have reviewed the triage vital signs and the nursing notes.   HISTORY  Chief Complaint Chest Pain     HPI Whitney Bernard is a 59 y.o. female with history of COPD, diabetes, hypertension, who smoke cigarettes who presents with complaints of chest pain.  Patient reports she woke up this morning around 6 AM with pressure-like sensation to her chest, this has continued.  She has never had this before.  She denies shortness of breath.  No radiation of pain.  No diaphoresis nausea or vomiting.  Has not taken anything for this besides an aspirin with little improvement.  No history of heart disease, although she notes it runs in her family extensively  Past Medical History:  Diagnosis Date  . Acute pancreatitis   . Chronic pain syndrome   . COPD (chronic obstructive pulmonary disease) (HCC)   . Depression   . Diabetes mellitus without complication (HCC)   . Headache(784.0)    migraines  . Hypercholesteremia   . Hypertension   . Hyperthyroidism   . Hypokalemia   . Migraine   . Narcotic abuse (HCC)   . RA (rheumatoid arthritis) [facility])     Patient Active Problem List   Diagnosis Date Noted  . Chest pain, non-cardiac 08/23/2017  . Protein-calorie malnutrition, severe 12/26/2014  . 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 Loose stools   . Diarrhea   . Hypokalemia 12/24/2014  . BP (high blood pressure) 02/23/2013  . Misuse of prescription only drugs 02/23/2013  . Anxiety 06/22/2012  . Colitis 05/11/2012  . Clinical depression 05/11/2012  . Hypercholesterolemia 05/11/2012  . Gastroduodenal ulcer 03/23/2012    Past Surgical History:  Procedure Laterality Date  . ANTERIOR CERVICAL DECOMP/DISCECTOMY FUSION N/A 11/26/2012   Procedure: ANTERIOR CERVICAL DECOMPRESSION/DISCECTOMY FUSION 2 LEVELS;  Surgeon: Reinaldo Meeker, MD;  Location: [facility] NEURO ORS;  Service:  Neurosurgery;  Laterality: N/A;  ANTERIOR CERVICAL DECOMPRESSION/DISCECTOMY FUSION 2 LEVELS  . CERVICAL POLYPECTOMY    . CHOLECYSTECTOMY    . ESOPHAGOGASTRODUODENOSCOPY N/A 12/26/2014   Procedure: ESOPHAGOGASTRODUODENOSCOPY (EGD);  Surgeon: Midge Minium, MD;  Location: [facility] ENDOSCOPY;  Service: Endoscopy;  Laterality: N/A;  . FRACTURE SURGERY    . NECK SURGERY    . TUBAL LIGATION    . WISDOM TOOTH EXTRACTION      Prior to Admission medications   Medication Sig Start Date End Date Taking? Authorizing Provider  ALPRAZolam Prudy Feeler) 0.5 MG tablet Take 1 tablet (0.5 mg total) by mouth at bedtime as needed for anxiety. 05/05/18   Irean Hong, MD  aspirin EC 81 MG EC tablet Take 1 tablet (81 mg total) by mouth daily. Patient not taking: Reported on 05/02/2018 08/25/17   Enedina Finner, MD  busPIRone (BUSPAR) 10 MG tablet Take 10 mg by mouth 2 (two) times daily.  04/26/18   [provider]  Butalbital-APAP-Caffeine 50-300-40 MG CAPS Take by mouth 3 (three) times daily as needed for headache.  04/26/18   [provider]  carisoprodol (SOMA) 350 MG tablet Take 350 mg by mouth 2 (two) times daily as needed for muscle spasms.     [provider]  D3-50 1.25 MG (50000 UT) capsule Take 50,000 Units by mouth every Tuesday. 01/26/18   [provider]  ezetimibe (ZETIA) 10 MG tablet Take 10 mg by mouth daily. 01/26/18   [provider]  furosemide (LASIX) 40 MG tablet Take 40 mg  by mouth daily.    [provider]  HYDROcodone-acetaminophen (NORCO) 5-325 MG tablet Take 1 tablet by mouth every 6 (six) hours as needed for up to 7 doses for severe pain. Patient not taking: Reported on 05/02/2018 12/18/17   Merrily Brittle, MD  oxyCODONE-acetaminophen (PERCOCET/ROXICET) 5-325 MG tablet Take 1 tablet by mouth every 4 (four) hours as needed for severe pain. 05/05/18   Irean Hong, MD  pantoprazole (PROTONIX) 40 MG tablet Take 1 tablet (40 mg total) by mouth daily. 05/04/18    Altamese Dilling, MD  PARoxetine (PAXIL) 40 MG tablet Take 40 mg by mouth daily.    [provider]  potassium chloride SA (K-DUR,KLOR-CON) 20 MEQ tablet Take 20 mEq by mouth 3 (three) times daily.     [provider]     Allergies Almond oil and Statins  Family History  Problem Relation Age of Onset  . CAD Father   . Alzheimer's disease Other     Social History Social History   Tobacco Use  . Smoking status: Former Smoker    Packs/day: 0.50    Years: 38.00    Pack years: 19.00    Types: Cigarettes    Quit date: 11/14/2012    Years since quitting: 6.2  . Smokeless tobacco: Never Used  Substance Use Topics  . Alcohol use: Not Currently    Comment: occ  . Drug use: No    Review of Systems  Constitutional: No fever/chills Eyes: No visual changes.  ENT: No sore throat. Cardiovascular: As above Respiratory: Denies shortness of breath. Gastrointestinal: No abdominal pain.  No nausea, no vomiting.   Genitourinary: Negative for dysuria. Musculoskeletal: Negative for back pain. Skin: Negative for rash. Neurological: Negative for headaches or weakness   ____________________________________________   PHYSICAL EXAM:  VITAL SIGNS: ED Triage Vitals [02/22/19 1244]  Enc Vitals Group     BP 129/89     Pulse Rate (!) 108     Resp 16     Temp 98.9 F (37.2 C)     Temp Source Oral     SpO2 98 %     Weight 65.8 kg (145 lb)     Height 1.626 m (5\' 4" )     Head Circumference      Peak Flow      Pain Score 7     Pain Loc      Pain Edu?      Excl. in GC?     Constitutional: Alert and oriented.   Nose: No congestion/rhinnorhea. Mouth/Throat: Mucous membranes are moist.    Cardiovascular: Normal rate, regular rhythm. Grossly normal heart sounds.  Good peripheral circulation. Respiratory: Normal respiratory effort.  No retractions.  Gastrointestinal: Soft and nontender. No distention.   Musculoskeletal:Warm and well perfused Neurologic:   Normal speech and language. No gross focal neurologic deficits are appreciated.  Skin:  Skin is warm, dry and intact. No rash noted. Psychiatric: Mood and affect are normal. Speech and behavior are normal.  ____________________________________________   LABS (all labs ordered are listed, but only abnormal results are displayed)  Labs Reviewed  BASIC METABOLIC PANEL - Abnormal; Notable for the following components:      Result Value   Potassium 5.4 (*)    Glucose, Bld 175 (*)    BUN 34 (*)    Creatinine, Ser 1.38 (*)    GFR calc non Af Amer 41 (*)    GFR calc Af Amer 48 (*)    All other components  within normal limits  CBC - Abnormal; Notable for the following components:   WBC 14.0 (*)    Platelets 409 (*)    All other components within normal limits  TROPONIN I (HIGH SENSITIVITY) - Abnormal; Notable for the following components:   Troponin I (High Sensitivity) 180 (*)    All other components within normal limits  SARS CORONAVIRUS 2 (TAT 6-24 HRS)   ____________________________________________  EKG  ED ECG REPORT I, Lavonia Drafts, the attending physician, personally viewed and interpreted this ECG.  Date: 02/22/2019  Rhythm: normal sinus rhythm QRS Axis: normal Intervals: normal ST/T Wave abnormalities: Minimal isolated elevation in lead III Narrative Interpretation: Does not meet STEMI criteria  ____________________________________________  RADIOLOGY  Chest x-ray unremarkable ____________________________________________   PROCEDURES  Procedure(s) performed: No  Procedures   Critical Care performed: yes  CRITICAL CARE Performed by: Lavonia Drafts   Total critical care time: 30 minutes  Critical care time was exclusive of separately billable procedures and treating other patients.  Critical care was necessary to treat or prevent imminent or life-threatening deterioration.  Critical care was time spent personally by me on the following activities:  development of treatment plan with patient and/or surrogate as well as nursing, discussions with consultants, evaluation of patient's response to treatment, examination of patient, obtaining history from patient or surrogate, ordering and performing treatments and interventions, ordering and review of laboratory studies, ordering and review of radiographic studies, pulse oximetry and re-evaluation of patient's condition.  ____________________________________________   INITIAL IMPRESSION / ASSESSMENT AND PLAN / ED COURSE  Pertinent labs & imaging results that were available during my care of the patient were reviewed by me and considered in my medical decision making (see chart for details).  Patient presents with pressure-like chest pain that started this morning, EKG with isolated elevation in lead III but no other abnormalities.  We will treat with nitroglycerin, she is already received aspirin.  Chest x-ray reassuring   Notified of elevated troponin of 180, given chest pain consistent with NSTEMI, will start heparin drip, will discuss with hospitalist for admission    ____________________________________________   FINAL CLINICAL IMPRESSION(S) / ED DIAGNOSES  Final diagnoses:  NSTEMI (non-ST elevated myocardial infarction) [facility])        Note:  This document was prepared using Dragon voice recognition software and may include unintentional dictation errors.   Lavonia Drafts, MD 02/22/19 1355

## 2019-02-22 NOTE — H&P (Signed)
History and Physical    [name] HUD:[id number] DOB: 10/15/1960 DOA: 02/22/2019  Referring MD/NP/PA:   PCP: Whitney Croon, MD   Patient coming from:  The patient is coming from home.  At baseline, pt is independent for most of ADL.        Chief Complaint: chest pan  HPI: Whitney Bernard is a 59 y.o. female with medical history significant of hypertension, hyperlipidemia, diabetes mellitus, COPD, GERD, depression with anxiety, rheumatoid arthritis, narcotic abuse, migraine headaches, pancreatitis, hypothyroidism, possible COVID-19 on 10/2018 (did not require inpatient remission), CKD stage III, who presents with chest pain.  Patient states that her chest pain started and this morning at about 6 AM, which is located in substernal area, moderate, pressure-like, radiating to both arms and the neck.  Patient does not have cough, shortness breath, fever, chills.  Denies nausea vomiting, diarrhea, abdominal pain, symptoms of UTI or unilateral weakness.  ED Course: pt was found to have troponin I 80, WBC 14.0, potassium 4.4, renal function slightly worsening, pending COVID-19 PCR, temperature normal, blood pressure 151/101, tachycardia, oxygen saturation 95% on room air.  Chest x-ray showed some atelectasis without obvious effusion.  Patient is admitted to telemetry bed as inpatient. Card, Dr. Okey Bernard is consulted  Review of Systems:   General: no fevers, chills, no body weight gain, has fatigue HEENT: no blurry vision, hearing changes or sore throat Respiratory: no dyspnea, coughing, wheezing CV: has chest pain, no palpitations GI: no nausea, vomiting, abdominal pain, diarrhea, constipation GU: no dysuria, burning on urination, increased urinary frequency, hematuria  Ext: no leg edema Neuro: no unilateral weakness, numbness, or tingling, no vision change or hearing loss Skin: no rash, no skin tear. MSK: No muscle spasm, no deformity, no limitation of range of movement in spin Heme: No easy  bruising.  Travel history: No recent long distant travel.  Allergy:  Allergies  Allergen Reactions  . Whitney Bernard  . Statins Other (See Comments)    Multiple statins    Past Medical History:  Diagnosis Date  . Acute pancreatitis   . Chronic pain syndrome   . COPD (chronic obstructive pulmonary disease) (HCC)   . Depression   . Diabetes mellitus without complication (HCC)   . Headache(784.0)    migraines  . Hypercholesteremia   . Hypertension   . Hyperthyroidism   . Hypokalemia   . Migraine   . Narcotic abuse (HCC)   . RA (rheumatoid arthritis) (HCC)     Past Surgical History:  Procedure Laterality Date  . ANTERIOR CERVICAL DECOMP/DISCECTOMY FUSION N/A 11/26/2012   Procedure: ANTERIOR CERVICAL DECOMPRESSION/DISCECTOMY FUSION 2 LEVELS;  Surgeon: Whitney Meeker, MD;  Location: [facility] NEURO ORS;  Service: Neurosurgery;  Laterality: N/A;  ANTERIOR CERVICAL DECOMPRESSION/DISCECTOMY FUSION 2 LEVELS  . CERVICAL POLYPECTOMY    . CHOLECYSTECTOMY    . ESOPHAGOGASTRODUODENOSCOPY N/A 12/26/2014   Procedure: ESOPHAGOGASTRODUODENOSCOPY (EGD);  Surgeon: Whitney Minium, MD;  Location: [facility] ENDOSCOPY;  Service: Endoscopy;  Laterality: N/A;  . FRACTURE SURGERY    . NECK SURGERY    . TUBAL LIGATION    . WISDOM TOOTH EXTRACTION      Social History:  reports that she quit smoking about 6 years ago. Her smoking use included cigarettes. She has a 19.00 pack-year smoking history. She has never used smokeless tobacco. She reports previous alcohol use. She reports that she does not use drugs.  Family History:  Family History  Problem Relation Age of Onset  . CAD Father   .  Alzheimer's disease Other      Prior to Admission medications   Medication Sig Start Date End Date Taking? Authorizing Provider  ALPRAZolam Whitney Bernard) 0.5 MG tablet Take 1 tablet (0.5 mg total) by mouth at bedtime as needed for anxiety. 05/05/18   Whitney Hong, MD  aspirin EC 81 MG EC tablet Take 1 tablet (81 mg total) by  mouth daily. Patient not taking: Reported on 05/02/2018 08/25/17   Whitney Finner, MD  busPIRone (BUSPAR) 10 MG tablet Take 10 mg by mouth 2 (two) times daily.  04/26/18   [provider]  Butalbital-APAP-Caffeine 50-300-40 MG CAPS Take by mouth 3 (three) times daily as needed for headache.  04/26/18   [provider]  carisoprodol (SOMA) 350 MG tablet Take 350 mg by mouth 2 (two) times daily as needed for muscle spasms.     [provider]  D3-50 1.25 MG (50000 UT) capsule Take 50,000 Units by mouth every Tuesday. 01/26/18   [provider]  ezetimibe (ZETIA) 10 MG tablet Take 10 mg by mouth daily. 01/26/18   [provider]  furosemide (LASIX) 40 MG tablet Take 40 mg by mouth daily.    [provider]  HYDROcodone-acetaminophen (NORCO) 5-325 MG tablet Take 1 tablet by mouth every 6 (six) hours as needed for up to 7 doses for severe pain. Patient not taking: Reported on 05/02/2018 12/18/17   Whitney Brittle, MD  oxyCODONE-acetaminophen (PERCOCET/ROXICET) 5-325 MG tablet Take 1 tablet by mouth every 4 (four) hours as needed for severe pain. 05/05/18   Whitney Hong, MD  pantoprazole (PROTONIX) 40 MG tablet Take 1 tablet (40 mg total) by mouth daily. 05/04/18   Whitney Dilling, MD  PARoxetine (PAXIL) 40 MG tablet Take 40 mg by mouth daily.    [provider]  potassium chloride SA (K-DUR,KLOR-CON) 20 MEQ tablet Take 20 mEq by mouth 3 (three) times daily.     [provider]    Physical Exam: Vitals:   02/22/19 1244 02/22/19 1345 02/22/19 1400  BP: 129/89 (!) 151/101 131/89  Pulse: (!) 108 94 (!) 102  Resp: 16 17 14   Temp: 98.9 F (37.2 C)    TempSrc: Oral    SpO2: 98% 95% 94%  Weight: 65.8 kg    Height: 5\' 4"  (1.626 m)     General: Not in acute distress HEENT:       Eyes: PERRL, EOMI, no scleral icterus.       ENT: No discharge from the ears and nose, no pharynx injection, no tonsillar enlargement.        Neck: No JVD, no  bruit, no mass felt. Heme: No neck lymph node enlargement. Cardiac: S1/S2, RRR, No murmurs, No gallops or rubs. Respiratory: No rales, wheezing, rhonchi or rubs. GI: Soft, nondistended, nontender, no rebound pain, no organomegaly, BS present. GU: No hematuria Ext: No pitting leg edema bilaterally. 2+DP/PT pulse bilaterally. Musculoskeletal: No joint deformities, No joint redness or warmth, no limitation of ROM in spin. Skin: No rashes.  Neuro: Alert, oriented X3, cranial nerves II-XII grossly intact, moves all extremities normally. Psych: Patient is not psychotic, no suicidal or hemocidal ideation.  Labs on Admission: I have personally reviewed following labs and imaging studies  CBC: Recent Labs  Lab 02/22/19 1250  WBC 14.0*  HGB 13.9  HCT 44.4  MCV 91.0  PLT 409*   Basic Metabolic Panel: Recent Labs  Lab 02/22/19 1250  NA 138  K 5.4*  CL 104  CO2 22  GLUCOSE 175*  BUN 34*  CREATININE 1.38*  CALCIUM 9.7   GFR: Estimated Creatinine Clearance: 40.4 mL/min (A) (by C-G formula based on SCr of 1.38 mg/dL (H)). Liver Function Tests: No results for input(s): AST, ALT, ALKPHOS, BILITOT, PROT, ALBUMIN in the last 168 hours. No results for input(s): LIPASE, AMYLASE in the last 168 hours. No results for input(s): AMMONIA in the last 168 hours. Coagulation Profile: No results for input(s): INR, PROTIME in the last 168 hours. Cardiac Enzymes: No results for input(s): CKTOTAL, CKMB, CKMBINDEX, TROPONINI in the last 168 hours. BNP (last 3 results) No results for input(s): PROBNP in the last 8760 hours. HbA1C: No results for input(s): HGBA1C in the last 72 hours. CBG: No results for input(s): GLUCAP in the last 168 hours. Lipid Profile: No results for input(s): CHOL, HDL, LDLCALC, TRIG, CHOLHDL, LDLDIRECT in the last 72 hours. Thyroid Function Tests: No results for input(s): TSH, T4TOTAL, FREET4, T3FREE, THYROIDAB in the last 72 hours. Anemia Panel: No results for input(s):  VITAMINB12, FOLATE, FERRITIN, TIBC, IRON, RETICCTPCT in the last 72 hours. Urine analysis:    Component Value Date/Time   COLORURINE YELLOW (A) 05/02/2018 0751   APPEARANCEUR CLEAR (A) 05/02/2018 0751   APPEARANCEUR Clear 12/03/2013 1556   LABSPEC 1.023 05/02/2018 0751   LABSPEC 1.024 12/03/2013 1556   PHURINE 5.0 05/02/2018 0751   GLUCOSEU NEGATIVE 05/02/2018 0751   GLUCOSEU Negative 12/03/2013 1556   HGBUR NEGATIVE 05/02/2018 0751   BILIRUBINUR NEGATIVE 05/02/2018 0751   BILIRUBINUR Negative 12/03/2013 1556   KETONESUR NEGATIVE 05/02/2018 0751   PROTEINUR 30 (A) 05/02/2018 0751   NITRITE POSITIVE (A) 05/02/2018 0751   LEUKOCYTESUR TRACE (A) 05/02/2018 0751   LEUKOCYTESUR Negative 12/03/2013 1556   Sepsis Labs: @LABRCNTIP (procalcitonin:4,lacticidven:4) )No results found for this or any previous visit (from the past 240 hour(s)).   Radiological Exams on Admission: DG Chest 2 View  Result Date: 02/22/2019 CLINICAL DATA:  Chest pain EXAM: CHEST - 2 VIEW COMPARISON:  February 06, 2019 chest radiograph and chest CT; chest radiograph November 02, 2018 FINDINGS: There is apparent atelectatic change in each lung base and lingula. There is no evident edema or consolidation. Heart is upper normal in size with pulmonary vascularity normal. No adenopathy. There is old trauma involving the proximal right humerus with remodeling. Postoperative changes noted in the lower cervical region. IMPRESSION: Areas of basilar and lingular atelectatic change, essentially stable. No evidence of edema or consolidation. Stable cardiac silhouette. No evident adenopathy. Electronically Signed   By: Lowella Grip III M.D.   On: 02/22/2019 13:12     EKG: Independently reviewed.  Sinus rhythm, QTC 436, low voltage, poor R wave progression, T wave inversion in lead III/aVF.   Assessment/Plan Principal Problem:   Chest pain Active Problems:   Depression with anxiety   Hypercholesterolemia   COPD (chronic  obstructive pulmonary disease) (HCC)   Hypertension   CKD (chronic kidney disease), stage IIIa   NSTEMI (non-ST elevated myocardial infarction) (HCC)   Hyperkalemia   Elevated troponin   GERD (gastroesophageal reflux disease)   Type II diabetes mellitus with renal manifestations (HCC)   Chest pain and possible NSTEMI: Troponin is elevated at 188.  EKG has T wave inversion in lead III/aVF. Dr. Saunders Revel of card was consulted -->planning for cardiac catheter tomorrow  - will admit to tele bed as inpt - on IV heparin - Trend Trop - Repeat EKG in the am  - prn Nitroglycerin, Morphine, and aspirin, zetia  - Risk factor stratification:  will check FLP and A1C  - check UDS - 2d echo  Depression with anxiety: -Continue home medications  Hypercholesterolemia: -zetia  COPD (chronic obstructive pulmonary disease) (HCC): stable -prn albuterol inhaler  HTN:  -Continue home medications: lasix -hydralazine prn  CKD (chronic kidney disease), stage IIIa: Renal function close to baseline.  Baseline creatinine 1.1-1.3.  His creatinine is 1.38, BUN 34 -f/u by BMP  Hyperkalemia: -10 g of Lokelma  GERD (gastroesophageal reflux disease): -PPI  Diet controled Type II diabetes mellitus with renal manifestations (HCC): A1c 6.1 on 05/02/18 -check CBG qAM   Inpatient status:  # Patient requires inpatient status due to high intensity of service, high risk for further deterioration and high frequency of surveillance required.  I certify that at the point of admission it is my clinical judgment that the patient will require inpatient hospital care spanning beyond 2 midnights from the point of admission.  . This patient has multiple chronic comorbidities including hypertension, hyperlipidemia, diabetes mellitus, COPD, GERD, depression with anxiety, rheumatoid arthritis, narcotic abuse, migraine headaches, pancreatitis, hypothyroidism, possible COVID-19 on 10/2018 (did not require inpatient remission), CKD  stage III . Now patient has presenting with chest pain and non-STEMI, also has hyperkalemia . The initial radiographic and laboratory data are worrisome because of elevated troponin, hyperkalemia, T wave inversion on EKG . Current medical needs: please see my assessment and plan . Predictability of an adverse outcome (risk): Patient has multiple comorbidities as listed above. Now presenting with chest pain and non-STEMI, also has hyperkalemia. Patient's presentation is highly complicated.  Patient is at high risk of deteriorating.  Will need to be treated in hospital for at least 2 days.            DVT ppx: on IV Heparin   Code Status: Full code Family Communication: None at bed side.     Disposition Plan:  Anticipate discharge back to previous home environment Consults called:  Card, Dr. Okey Bernard Admission status: ele bed as inpt    Date of Service 02/22/2019    Lorretta Harp [facility]   If 7PM-7AM, please contact night-coverage [email or web address] Password [email or web address] 02/22/2019, 4:00 PM

## 2019-02-22 NOTE — ED Triage Notes (Addendum)
FIRST NURSE NOTE- here for chest pain.  PIV by EMS.  Alert and oriented.  EKG normal with EMS.  VSS with EMS.  Pulled next for EKG

## 2019-02-22 NOTE — ED Triage Notes (Signed)
Pt here via EMS from home with c/o midsternal cp, bilateral upper arm pain and left neck pain that began this am. Denies cardiac hx. Denies cough, NAD.

## 2019-02-22 NOTE — ED Notes (Signed)
Informed pt that she is now NPO except for sips with meds and ice chips. Pt requested ice chips.

## 2019-02-22 NOTE — ED Notes (Signed)
Pt ambulated to bathroom without assistance 

## 2019-02-22 NOTE — Progress Notes (Signed)
ANTICOAGULATION CONSULT NOTE  Pharmacy Consult for heparin Indication: chest pain/ACS  Allergies  Allergen Reactions  . Almond Oil Hives  . Statins Other (See Comments)    Multiple statins    Patient Measurements: Height: 5\' 4"  (162.6 cm) Weight: 145 lb (65.8 kg) IBW/kg (Calculated) : 54.7 Heparin Dosing Weight: 65 kg  Vital Signs: Temp: 98.9 F (37.2 C) (01/05 1244) Temp Source: Oral (01/05 1244) BP: 151/101 (01/05 1345) Pulse Rate: 94 (01/05 1345)  Labs: Recent Labs    02/22/19 1250  HGB 13.9  HCT 44.4  PLT 409*  CREATININE 1.38*  TROPONINIHS 180*    Estimated Creatinine Clearance: 40.4 mL/min (A) (by C-G formula based on SCr of 1.38 mg/dL (H)).   Medical History: Past Medical History:  Diagnosis Date  . Acute pancreatitis   . Chronic pain syndrome   . COPD (chronic obstructive pulmonary disease) (HCC)   . Depression   . Diabetes mellitus without complication (HCC)   . Headache(784.0)    migraines  . Hypercholesteremia   . Hypertension   . Hyperthyroidism   . Hypokalemia   . Migraine   . Narcotic abuse (HCC)   . RA (rheumatoid arthritis) [facility])     Assessment: 59 year old female presented with chest pain. Pharmacy consulted for heparin dosing.  Goal of Therapy:  Heparin level 0.3-0.7 units/ml Monitor platelets by anticoagulation protocol: Yes   Plan:  Heparin 3900 unit bolus followed by heparin drip at 750 units/hr. HL 1/6 at 0000. CBC daily.  67, PharmD 02/22/2019,2:05 PM

## 2019-02-22 NOTE — ED Notes (Signed)
Dr Lenard Lance notified of critical troponin.

## 2019-02-22 NOTE — ED Notes (Signed)
Spoke with Dr. Mariah Milling, MD pt can eat/drink and will be NPO after midnight.

## 2019-02-23 ENCOUNTER — Other Ambulatory Visit: Payer: Self-pay

## 2019-02-23 ENCOUNTER — Encounter
Admission: EM | Disposition: A | Discharge status: Home / Self Care | Payer: Self-pay | Source: Home / Self Care | Attending: Internal Medicine

## 2019-02-23 ENCOUNTER — Encounter: Payer: Self-pay | Admitting: Internal Medicine

## 2019-02-23 ENCOUNTER — Inpatient Hospital Stay (HOSPITAL_COMMUNITY)
Admit: 2019-02-23 | Discharge: 2019-02-23 | Disposition: A | Discharge status: Home / Self Care | Payer: 59 | Attending: Internal Medicine | Admitting: Internal Medicine

## 2019-02-23 DIAGNOSIS — I2510 Atherosclerotic heart disease of native coronary artery without angina pectoris: Secondary | ICD-10-CM

## 2019-02-23 DIAGNOSIS — I10 Essential (primary) hypertension: Secondary | ICD-10-CM

## 2019-02-23 DIAGNOSIS — Z720 Tobacco use: Secondary | ICD-10-CM

## 2019-02-23 DIAGNOSIS — E1129 Type 2 diabetes mellitus with other diabetic kidney complication: Secondary | ICD-10-CM

## 2019-02-23 DIAGNOSIS — R079 Chest pain, unspecified: Secondary | ICD-10-CM

## 2019-02-23 HISTORY — PX: CORONARY STENT INTERVENTION: CATH118234

## 2019-02-23 HISTORY — PX: LEFT HEART CATH AND CORONARY ANGIOGRAPHY: CATH118249

## 2019-02-23 LAB — URINE DRUG SCREEN, QUALITATIVE (ARMC ONLY)
Amphetamines, Ur Screen: NOT DETECTED
Barbiturates, Ur Screen: NOT DETECTED
Benzodiazepine, Ur Scrn: NOT DETECTED
Cannabinoid 50 Ng, Ur ~~LOC~~: NOT DETECTED
Cocaine Metabolite,Ur ~~LOC~~: NOT DETECTED
MDMA (Ecstasy)Ur Screen: NOT DETECTED
Methadone Scn, Ur: NOT DETECTED
Opiate, Ur Screen: POSITIVE — AB
Phencyclidine (PCP) Ur S: NOT DETECTED
Tricyclic, Ur Screen: NOT DETECTED

## 2019-02-23 LAB — HEMOGLOBIN A1C
Hgb A1c MFr Bld: 6.5 % — ABNORMAL HIGH (ref 4.8–5.6)
Mean Plasma Glucose: 139.85 mg/dL

## 2019-02-23 LAB — HEPARIN LEVEL (UNFRACTIONATED)
Heparin Unfractionated: <0.1 IU/mL — ABNORMAL LOW (ref 0.30–0.70)
Heparin Unfractionated: 0.15 IU/mL — ABNORMAL LOW (ref 0.30–0.70)

## 2019-02-23 LAB — CBC
HCT: 35.1 % — ABNORMAL LOW (ref 36.0–46.0)
Hemoglobin: 11 g/dL — ABNORMAL LOW (ref 12.0–15.0)
MCH: 29.4 pg (ref 26.0–34.0)
MCHC: 31.3 g/dL (ref 30.0–36.0)
MCV: 93.9 fL (ref 80.0–100.0)
Platelets: 311 10*3/uL (ref 150–400)
RBC: 3.74 MIL/uL — ABNORMAL LOW (ref 3.87–5.11)
RDW: 14.3 % (ref 11.5–15.5)
WBC: 12 10*3/uL — ABNORMAL HIGH (ref 4.0–10.5)
nRBC: 0 % (ref 0.0–0.2)

## 2019-02-23 LAB — LIPID PANEL
Cholesterol: 288 mg/dL — ABNORMAL HIGH (ref 0–200)
HDL: 33 mg/dL — ABNORMAL LOW (ref >40)
LDL Cholesterol: 178 mg/dL — ABNORMAL HIGH (ref 0–99)
Total CHOL/HDL Ratio: 8.7 RATIO
Triglycerides: 384 mg/dL — ABNORMAL HIGH (ref <150)
VLDL: 77 mg/dL — ABNORMAL HIGH (ref 0–40)

## 2019-02-23 LAB — BASIC METABOLIC PANEL
Anion gap: 6 (ref 5–15)
BUN: 31 mg/dL — ABNORMAL HIGH (ref 6–20)
CO2: 25 mmol/L (ref 22–32)
Calcium: 8.8 mg/dL — ABNORMAL LOW (ref 8.9–10.3)
Chloride: 104 mmol/L (ref 98–111)
Creatinine, Ser: 1.41 mg/dL — ABNORMAL HIGH (ref 0.44–1.00)
GFR calc Af Amer: 47 mL/min — ABNORMAL LOW (ref >60)
GFR calc non Af Amer: 40 mL/min — ABNORMAL LOW (ref >60)
Glucose, Bld: 132 mg/dL — ABNORMAL HIGH (ref 70–99)
Potassium: 5.2 mmol/L — ABNORMAL HIGH (ref 3.5–5.1)
Sodium: 135 mmol/L (ref 135–145)

## 2019-02-23 LAB — ECHOCARDIOGRAM COMPLETE
Height: 64 in
Weight: 2515.2 oz

## 2019-02-23 LAB — POCT ACTIVATED CLOTTING TIME
Activated Clotting Time: 175 seconds
Activated Clotting Time: 213 seconds
Activated Clotting Time: 439 seconds

## 2019-02-23 LAB — SARS CORONAVIRUS 2 (TAT 6-24 HRS): SARS Coronavirus 2: NEGATIVE

## 2019-02-23 LAB — GLUCOSE, CAPILLARY
Glucose-Capillary: 125 mg/dL — ABNORMAL HIGH (ref 70–99)
Glucose-Capillary: 115 mg/dL — ABNORMAL HIGH (ref 70–99)
Glucose-Capillary: 138 mg/dL — ABNORMAL HIGH (ref 70–99)
Glucose-Capillary: 173 mg/dL — ABNORMAL HIGH (ref 70–99)

## 2019-02-23 SURGERY — LEFT HEART CATH AND CORONARY ANGIOGRAPHY
Anesthesia: Moderate Sedation

## 2019-02-23 MED ORDER — SODIUM CHLORIDE 0.9% FLUSH: 3.0000 mL | INTRAVENOUS | Status: DC | PRN

## 2019-02-23 MED ORDER — SODIUM CHLORIDE 0.9 % WEIGHT BASED INFUSION: 1.0000 mL/kg/h | INTRAVENOUS | Status: DC

## 2019-02-23 MED ORDER — HEPARIN (PORCINE) IN NACL 1000-0.9 UT/500ML-% IV SOLN
INTRAVENOUS | Status: DC | PRN
  Administered 2019-02-23: 1000 mL

## 2019-02-23 MED ORDER — NITROGLYCERIN 1 MG/10 ML FOR IR/CATH LAB
INTRA_ARTERIAL | Status: AC
  Filled 2019-02-23: qty 10

## 2019-02-23 MED ORDER — HEPARIN (PORCINE) IN NACL 1000-0.9 UT/500ML-% IV SOLN
INTRAVENOUS | Status: AC
  Filled 2019-02-23: qty 1000

## 2019-02-23 MED ORDER — HEPARIN BOLUS VIA INFUSION
2000.0000 [IU] | Freq: Once | INTRAVENOUS | Status: AC
  Administered 2019-02-23: 2000 [IU] via INTRAVENOUS
  Filled 2019-02-23: qty 2000

## 2019-02-23 MED ORDER — SODIUM CHLORIDE 0.9 % IV SOLN: 250.0000 mL | INTRAVENOUS | Status: DC | PRN

## 2019-02-23 MED ORDER — FENTANYL CITRATE (PF) 100 MCG/2ML IJ SOLN
INTRAMUSCULAR | Status: DC | PRN
  Administered 2019-02-23: 25 ug via INTRAVENOUS

## 2019-02-23 MED ORDER — MIDAZOLAM HCL 2 MG/2ML IJ SOLN
INTRAMUSCULAR | Status: DC | PRN
  Administered 2019-02-23 (×2): 0.5 mg via INTRAVENOUS

## 2019-02-23 MED ORDER — IOHEXOL 300 MG/ML  SOLN
INTRAMUSCULAR | Status: DC | PRN
  Administered 2019-02-23: 14:00:00 30 mL via INTRA_ARTERIAL

## 2019-02-23 MED ORDER — PRASUGREL HCL 10 MG PO TABS
ORAL_TABLET | ORAL | Status: DC | PRN
  Administered 2019-02-23: 60 mg via ORAL

## 2019-02-23 MED ORDER — SODIUM CHLORIDE 0.9 % WEIGHT BASED INFUSION
3.0000 mL/kg/h | INTRAVENOUS | Status: DC
  Administered 2019-02-23: 3 mL/kg/h via INTRAVENOUS

## 2019-02-23 MED ORDER — HYDRALAZINE HCL 20 MG/ML IJ SOLN: 10.0000 mg | INTRAMUSCULAR | Status: AC | PRN

## 2019-02-23 MED ORDER — MIDAZOLAM HCL 2 MG/2ML IJ SOLN
INTRAMUSCULAR | Status: DC | PRN
  Administered 2019-02-23: 0.5 mg via INTRAVENOUS

## 2019-02-23 MED ORDER — IOHEXOL 300 MG/ML  SOLN
INTRAMUSCULAR | Status: DC | PRN
  Administered 2019-02-23: 12:00:00 80 mL

## 2019-02-23 MED ORDER — SODIUM CHLORIDE 0.9 % IV SOLN
INTRAVENOUS | Status: DC | PRN
  Administered 2019-02-23: 13:00:00 1.75 mg/kg/h via INTRAVENOUS

## 2019-02-23 MED ORDER — ENOXAPARIN SODIUM 40 MG/0.4ML ~~LOC~~ SOLN
40.0000 mg | SUBCUTANEOUS | Status: DC
  Administered 2019-02-24 – 2019-02-25 (×2): 40 mg via SUBCUTANEOUS
  Filled 2019-02-23 (×2): qty 0.4

## 2019-02-23 MED ORDER — POLYETHYLENE GLYCOL 3350 17 G PO PACK: 17.0000 g | PACK | Freq: Every day | ORAL | Status: DC | PRN

## 2019-02-23 MED ORDER — INSULIN ASPART 100 UNIT/ML ~~LOC~~ SOLN
0.0000 [IU] | Freq: Three times a day (TID) | SUBCUTANEOUS | Status: DC
  Administered 2019-02-23 – 2019-02-24 (×2): 1 [IU] via SUBCUTANEOUS
  Filled 2019-02-23 (×2): qty 1

## 2019-02-23 MED ORDER — BIVALIRUDIN TRIFLUOROACETATE 250 MG IV SOLR
INTRAVENOUS | Status: AC
  Filled 2019-02-23: qty 250

## 2019-02-23 MED ORDER — MIDAZOLAM HCL 2 MG/2ML IJ SOLN
INTRAMUSCULAR | Status: AC
  Filled 2019-02-23: qty 2

## 2019-02-23 MED ORDER — ACETAMINOPHEN 325 MG PO TABS
650.0000 mg | ORAL_TABLET | Freq: Four times a day (QID) | ORAL | Status: DC | PRN
  Administered 2019-02-24: 650 mg via ORAL
  Filled 2019-02-23: qty 2

## 2019-02-23 MED ORDER — BIVALIRUDIN BOLUS VIA INFUSION - CUPID
INTRAVENOUS | Status: DC | PRN
  Administered 2019-02-23: 13:00:00 53.475 mg via INTRAVENOUS

## 2019-02-23 MED ORDER — INSULIN ASPART 100 UNIT/ML ~~LOC~~ SOLN
0.0000 [IU] | Freq: Every day | SUBCUTANEOUS | Status: DC
  Administered 2019-02-24: 5 [IU] via SUBCUTANEOUS
  Filled 2019-02-23: qty 1

## 2019-02-23 MED ORDER — INFLUENZA VAC SPLIT QUAD 0.5 ML IM SUSY
0.5000 mL | PREFILLED_SYRINGE | INTRAMUSCULAR | Status: AC
  Administered 2019-02-25: 0.5 mL via INTRAMUSCULAR
  Filled 2019-02-23: qty 0.5

## 2019-02-23 MED ORDER — PRASUGREL HCL 10 MG PO TABS
ORAL_TABLET | ORAL | Status: AC
  Filled 2019-02-23: qty 6

## 2019-02-23 MED ORDER — EZETIMIBE 10 MG PO TABS
10.0000 mg | ORAL_TABLET | Freq: Every day | ORAL | Status: DC
  Administered 2019-02-23 – 2019-02-25 (×3): 10 mg via ORAL
  Filled 2019-02-23 (×3): qty 1

## 2019-02-23 MED ORDER — NITROGLYCERIN 1 MG/10 ML FOR IR/CATH LAB
INTRA_ARTERIAL | Status: DC | PRN
  Administered 2019-02-23 (×2): 100 ug via INTRACORONARY

## 2019-02-23 MED ORDER — SODIUM CHLORIDE 0.9% FLUSH
3.0000 mL | Freq: Two times a day (BID) | INTRAVENOUS | Status: DC
  Administered 2019-02-23 (×2): 3 mL via INTRAVENOUS

## 2019-02-23 MED ORDER — FENTANYL CITRATE (PF) 100 MCG/2ML IJ SOLN
INTRAMUSCULAR | Status: AC
  Filled 2019-02-23: qty 2

## 2019-02-23 MED ORDER — HEPARIN SODIUM (PORCINE) 1000 UNIT/ML IJ SOLN
INTRAMUSCULAR | Status: DC | PRN
  Administered 2019-02-23: 6000 [IU] via INTRAVENOUS
  Administered 2019-02-23: 4000 [IU] via INTRAVENOUS

## 2019-02-23 MED ORDER — FENTANYL CITRATE (PF) 100 MCG/2ML IJ SOLN
INTRAMUSCULAR | Status: DC | PRN
  Administered 2019-02-23: 12.5 ug via INTRAVENOUS
  Administered 2019-02-23: 25 ug via INTRAVENOUS

## 2019-02-23 MED ORDER — LABETALOL HCL 5 MG/ML IV SOLN: 10.0000 mg | INTRAVENOUS | Status: AC | PRN

## 2019-02-23 MED ORDER — PRASUGREL HCL 10 MG PO TABS
10.0000 mg | ORAL_TABLET | Freq: Every day | ORAL | Status: DC
  Administered 2019-02-24 – 2019-02-25 (×2): 10 mg via ORAL
  Filled 2019-02-23 (×2): qty 1

## 2019-02-23 MED ORDER — ALBUTEROL SULFATE (2.5 MG/3ML) 0.083% IN NEBU: 2.5000 mg | INHALATION_SOLUTION | RESPIRATORY_TRACT | Status: DC | PRN

## 2019-02-23 MED ORDER — SODIUM CHLORIDE 0.9% FLUSH
3.0000 mL | Freq: Two times a day (BID) | INTRAVENOUS | Status: DC
  Administered 2019-02-23 – 2019-02-25 (×4): 3 mL via INTRAVENOUS

## 2019-02-23 MED ORDER — HEPARIN SODIUM (PORCINE) 1000 UNIT/ML IJ SOLN
INTRAMUSCULAR | Status: AC
  Filled 2019-02-23: qty 1

## 2019-02-23 MED ORDER — SENNOSIDES-DOCUSATE SODIUM 8.6-50 MG PO TABS: 2.0000 | ORAL_TABLET | Freq: Every evening | ORAL | Status: DC | PRN

## 2019-02-23 SURGICAL SUPPLY — 23 items
BALLN TREK RX 2.25X12 (BALLOONS) ×3
BALLN ~~LOC~~ TREK RX 3.0X8 (BALLOONS) ×3
BALLOON TREK RX 2.25X12 (BALLOONS) IMPLANT
BALLOON ~~LOC~~ TREK RX 3.0X8 (BALLOONS) IMPLANT
CATH INFINITI 5 FR 3DRC (CATHETERS) ×2 IMPLANT
CATH INFINITI 5 FR AL2 (CATHETERS) ×2 IMPLANT
CATH INFINITI 5 FR JL3.5 (CATHETERS) ×2 IMPLANT
CATH INFINITI 5FR ANG PIGTAIL (CATHETERS) ×2 IMPLANT
CATH INFINITI 5FR JL4 (CATHETERS) ×2 IMPLANT
CATH INFINITI 5FR JL5 (CATHETERS) IMPLANT
CATH INFINITI JR4 5F (CATHETERS) ×2 IMPLANT
CATH LAUNCHER 6FR EBU 3.75 (CATHETERS) ×2 IMPLANT
DEVICE CLOSURE MYNXGRIP 6/7F (Vascular Products) ×2 IMPLANT
DEVICE INFLAT 30 PLUS (MISCELLANEOUS) ×2 IMPLANT
KIT MANI 3VAL PERCEP (MISCELLANEOUS) ×3 IMPLANT
NDL PERC 18GX7CM (NEEDLE) IMPLANT
NEEDLE PERC 18GX7CM (NEEDLE) ×3 IMPLANT
PACK CARDIAC CATH (CUSTOM PROCEDURE TRAY) ×3 IMPLANT
SHEATH AVANTI 5FR X 11CM (SHEATH) ×2 IMPLANT
SHEATH AVANTI 6FR X 11CM (SHEATH) ×2 IMPLANT
STENT RESOLUTE ONYX 2.75X15 (Permanent Stent) ×2 IMPLANT
WIRE GUIDERIGHT .035X150 (WIRE) ×2 IMPLANT
WIRE RUNTHROUGH .014X180CM (WIRE) ×2 IMPLANT

## 2019-02-23 NOTE — Progress Notes (Signed)
Inpatient Diabetes Program Recommendations  AACE/ADA: New Consensus Statement on Inpatient Glycemic Control   Target Ranges:  Prepandial:   less than 140 mg/dL      Peak postprandial:   less than 180 mg/dL (1-2 hours)      Critically ill patients:  140 - 180 mg/dL   Results for SHALANE, FLORENDO (MRN [id number]) as of 02/23/2019 14:30  Ref. Range 02/23/2019 07:46 02/23/2019 10:54  Glucose-Capillary Latest Ref Range: 70 - 99 mg/dL 119 (H) 147 (H)  Results for ROSELINDA, BAHENA (MRN [id number]) as of 02/23/2019 14:30  Ref. Range 02/23/2019 07:30  Hemoglobin A1C Latest Ref Range: 4.8 - 5.6 % 6.5 (H)   Review of Glycemic Control  Diabetes history: DM2 Outpatient Diabetes medications: None Current orders for Inpatient glycemic control: None  Inpatient Diabetes Program Recommendations:   Correction (SSI): While inpatient, please consider ordering CBGs with Novolog 0-9 units TID with meals and Novolog 0-5 units QHS.  HgbA1C: A1C 6.5% on 02/23/19 indicating an average glucose of 140 mg/dl over the past 2-3 months.  NOTE: Noted consult for Diabetes Coordinator. Patient is currently in Cath Lab area following heart catheterization. Chart reviewed. Patient has DM2 hx and not taking any outpatient DM medications. Glucose 125 mg/dl and 865 mg/dl today. Would recommend ordering CBGs and Novolog correction while inpatient. Will continue to follow along.  Thanks, Orlando Penner, RN, MSN, CDE Diabetes Coordinator Inpatient Diabetes Program [phone] (Team Pager from 8am to 5pm)

## 2019-02-23 NOTE — Brief Op Note (Signed)
BRIEF CARDIAC CATHETERIZATION NOTE  02/23/2019  1:52 PM  PATIENT:  Whitney Bernard  59 y.o. female  PRE-OPERATIVE DIAGNOSIS:  non ST segment myocardial infarction  POST-OPERATIVE DIAGNOSIS:  non ST segment myocardial infarction  PROCEDURE:  Procedure(s): LEFT HEART CATH AND CORONARY ANGIOGRAPHY (N/A) CORONARY STENT INTERVENTION (N/A)  SURGEON:  Surgeon(s) and Role: Panel 1:    * Gollan, Tollie Pizza, MD - Primary Panel 2:    Yvonne Kendall, MD - Primary  FINDINGS: 1. Severe single vessel CAD involving mid LCx (see diagnostic cath report by Dr. Mariah Milling for details). 2. Successful PCI to mid LCx using Resolute Onyx 2.75 x 15 mm drug-eluting stent with 0% residual stenosis and TIMI-3 flow.  RECOMMENDATIONS: 1. DAPT with aspirin and prasugrel for at least 12 months. 2. Aggressive secondary prevention. 3. Likely d/c home tomorrow.  Yvonne Kendall, MD [facility]

## 2019-02-23 NOTE — Progress Notes (Signed)
PROGRESS NOTE    Whitney Bernard  BOF:[id number] DOB: December 11, 1960 DOA: 02/22/2019 PCP: Evelene Croon, MD   Brief Narrative:   59 year old with history of HTN, HLD, DM2, COPD, GERD, depression, rheumatoid arthritis, migraine, hypothyroidism, COVID-19 infection in September 2020, CKD stage III presented to the ER with chest pain.  Chest x-ray showed atelectasis, troponin of 80, EKG with T wave inversion in inferior leads.  Left heart catheterization 1/6 showed severe single-vessel coronary artery disease with successful PCI with drug-eluting stent to LCx.  Assessment & Plan:   Principal Problem:   Chest pain Active Problems:   Depression with anxiety   Hypercholesterolemia   COPD (chronic obstructive pulmonary disease) (HCC)   Hypertension   CKD (chronic kidney disease), stage IIIa   NSTEMI (non-ST elevated myocardial infarction) (HCC)   Hyperkalemia   Elevated troponin   GERD (gastroesophageal reflux disease)   Type II diabetes mellitus with renal manifestations (HCC)  Typical chest pain concerning for NSTEMI Coronary artery disease status post PCI/DES LCx -Status post left heart catheterization this morning status post PCI/drug-eluting stent to LCx -Aspirin and prasugrel -LDL 178, allergy to statin, start Zetia. -A1c 6.5. -Echo-pending  Hyperlipidemia -Elevated LDL 178, allergy to statin, started Zetia.  Can get outpatient PCSK 9  Diabetes mellitus type 2 -Hemoglobin A1c 6.5.  Diabetic coordinator consult.  Essential hypertension -Home meds.  Bradycardia medications to be tested per cardiology team  CKD stage III -Around baseline of 1-10.  Closely monitor.  GERD -PPI  DVT prophylaxis: Heparin drip, once stopped, transition to Lovenox Code Status:  Family Communication: Full code none Disposition Plan: Hospital stay post catheterization and medical management per cardiology team.  We will discharge once cleared by cardiology  Consultants:    Cardiology  Procedures:   LHC/RHC-1/6  Antimicrobials:      Subjective: Seen and examined at bedside prior to her left heart catheterization, denied any complaints.  She was chest pain-free.  Review of Systems Otherwise negative except as per HPI, including: General: Denies fever, chills, night sweats or unintended weight loss. Resp: Denies cough, wheezing, shortness of breath. Cardiac: Denies chest pain, palpitations, orthopnea, paroxysmal nocturnal dyspnea. GI: Denies abdominal pain, nausea, vomiting, diarrhea or constipation GU: Denies dysuria, frequency, hesitancy or incontinence MS: Denies muscle aches, joint pain or swelling Neuro: Denies headache, neurologic deficits (focal weakness, numbness, tingling), abnormal gait Psych: Denies anxiety, depression, SI/HI/AVH Skin: Denies new rashes or lesions ID: Denies sick contacts, exotic exposures, travel  Objective: Vitals:   02/23/19 0038 02/23/19 0543 02/23/19 0747 02/23/19 0747  BP:  105/81 96/64   Pulse:  80 79 86  Resp:  17 18   Temp: 98.4 F (36.9 C) 99.3 F (37.4 C) 98.9 F (37.2 C)   TempSrc: Oral Oral Oral   SpO2:  94% 91% 93%  Weight:      Height:        Intake/Output Summary (Last 24 hours) at 02/23/2019 0810 Last data filed at 02/23/2019 0550 Gross per 24 hour  Intake 814.5 ml  Output 650 ml  Net 164.5 ml   Filed Weights   02/22/19 1244 02/23/19 0037  Weight: 65.8 kg 71.3 kg    Examination:  General exam: Appears calm and comfortable  Respiratory system: Clear to auscultation. Respiratory effort normal. Cardiovascular system: S1 & S2 heard, RRR. No JVD, murmurs, rubs, gallops or clicks. No pedal edema. Gastrointestinal system: Abdomen is nondistended, soft and nontender. No organomegaly or masses felt. Normal bowel sounds heard. Central nervous system: Alert and  oriented. No focal neurological deficits. Extremities: Symmetric 5 x 5 power. Skin: No rashes, lesions or ulcers Psychiatry:  Judgement and insight appear normal. Mood & affect appropriate.     Data Reviewed:   CBC: Recent Labs  Lab 02/22/19 1250  WBC 14.0*  HGB 13.9  HCT 44.4  MCV 91.0  PLT 765*   Basic Metabolic Panel: Recent Labs  Lab 02/22/19 1250  NA 138  K 5.4*  CL 104  CO2 22  GLUCOSE 175*  BUN 34*  CREATININE 1.38*  CALCIUM 9.7   GFR: Estimated Creatinine Clearance: 42 mL/min (A) (by C-G formula based on SCr of 1.38 mg/dL (H)). Liver Function Tests: No results for input(s): AST, ALT, ALKPHOS, BILITOT, PROT, ALBUMIN in the last 168 hours. No results for input(s): LIPASE, AMYLASE in the last 168 hours. No results for input(s): AMMONIA in the last 168 hours. Coagulation Profile: Recent Labs  Lab 02/22/19 1608  INR 1.0   Cardiac Enzymes: No results for input(s): CKTOTAL, CKMB, CKMBINDEX, TROPONINI in the last 168 hours. BNP (last 3 results) No results for input(s): PROBNP in the last 8760 hours. HbA1C: No results for input(s): HGBA1C in the last 72 hours. CBG: Recent Labs  Lab 02/23/19 0746  GLUCAP 125*   Lipid Profile: No results for input(s): CHOL, HDL, LDLCALC, TRIG, CHOLHDL, LDLDIRECT in the last 72 hours. Thyroid Function Tests: No results for input(s): TSH, T4TOTAL, FREET4, T3FREE, THYROIDAB in the last 72 hours. Anemia Panel: No results for input(s): VITAMINB12, FOLATE, FERRITIN, TIBC, IRON, RETICCTPCT in the last 72 hours. Sepsis Labs: No results for input(s): PROCALCITON, LATICACIDVEN in the last 168 hours.  Recent Results (from the past 240 hour(s))  SARS CORONAVIRUS 2 (TAT 6-24 HRS) Nasopharyngeal Nasopharyngeal Swab     Status: None   Collection Time: 02/22/19  1:48 PM   Specimen: Nasopharyngeal Swab  Result Value Ref Range Status   SARS Coronavirus 2 NEGATIVE NEGATIVE Final    Comment: (NOTE) SARS-CoV-2 target nucleic acids are NOT DETECTED. The SARS-CoV-2 RNA is generally detectable in upper and lower respiratory specimens during the acute phase of  infection. Negative results do not preclude SARS-CoV-2 infection, do not rule out co-infections with other pathogens, and should not be used as the sole basis for treatment or other patient management decisions. Negative results must be combined with clinical observations, patient history, and epidemiological information. The expected result is Negative. Fact Sheet for Patients: SugarRoll.be Fact Sheet for Healthcare Providers: https://www.woods-mathews.com/ This test is not yet approved or cleared by the [place] FDA and  has been authorized for detection and/or diagnosis of SARS-CoV-2 by FDA under an Emergency Use Authorization (EUA). This EUA will remain  in effect (meaning this test can be used) for the duration of the COVID-19 declaration under Section 56 4(b)(1) of the Act, 21 U.S.C. section 360bbb-3(b)(1), unless the authorization is terminated or revoked sooner. Performed at [facility], [place] [address] [place], [place] [address]          Radiology Studies: DG Chest 2 View  Result Date: 02/22/2019 CLINICAL DATA:  Chest pain EXAM: CHEST - 2 VIEW COMPARISON:  February 06, 2019 chest radiograph and chest CT; chest radiograph November 02, 2018 FINDINGS: There is apparent atelectatic change in each lung base and lingula. There is no evident edema or consolidation. Heart is upper normal in size with pulmonary vascularity normal. No adenopathy. There is old trauma involving the proximal right humerus with remodeling. Postoperative changes noted in the lower cervical region.  IMPRESSION: Areas of basilar and lingular atelectatic change, essentially stable. No evidence of edema or consolidation. Stable cardiac silhouette. No evident adenopathy. Electronically Signed   By: Bretta Bang III M.D.   On: 02/22/2019 13:12        Scheduled Meds: . aspirin EC  81 mg Oral Daily  . busPIRone  10 mg Oral BID  . furosemide  40 mg Oral  Daily  . [START ON 02/24/2019] influenza vac split quadrivalent PF  0.5 mL Intramuscular Tomorrow-1000  . metoprolol tartrate  12.5 mg Oral BID  . nitroGLYCERIN  1 inch Topical Q6H  . pantoprazole  40 mg Oral Daily  . PARoxetine  40 mg Oral Daily  . sodium chloride flush  3 mL Intravenous Q12H   Continuous Infusions: . sodium chloride    . sodium chloride 75 mL/hr at 02/23/19 0300  . sodium chloride    . [START ON 02/24/2019] sodium chloride     Followed by  . [START ON 02/24/2019] sodium chloride    . heparin 950 Units/hr (02/23/19 0125)     LOS: 1 day   Time spent= 35 mins    Montee Tallman Joline Maxcy, MD [facility]  If 7PM-7AM, please contact night-coverage  02/23/2019, 8:10 AM

## 2019-02-23 NOTE — Progress Notes (Signed)
Progress Note  Patient Name: Whitney Bernard Date of Encounter: 02/23/2019  Primary Cardiologist: [facility]-Dr. End  Subjective   Reports having some dull low-grade chest pain overnight and today Cardiac catheterization today Severe stenosis of left circumflex Stent placed with good result  Inpatient Medications    Scheduled Meds: . [MAR Hold] aspirin EC  81 mg Oral Daily  . [MAR Hold] busPIRone  10 mg Oral BID  . ezetimibe  10 mg Oral Daily  . [MAR Hold] furosemide  40 mg Oral Daily  . [START ON 02/24/2019] influenza vac split quadrivalent PF  0.5 mL Intramuscular Tomorrow-1000  . insulin aspart  0-5 Units Subcutaneous QHS  . insulin aspart  0-9 Units Subcutaneous TID WC  . [MAR Hold] metoprolol tartrate  12.5 mg Oral BID  . [MAR Hold] nitroGLYCERIN  1 inch Topical Q6H  . [MAR Hold] pantoprazole  40 mg Oral Daily  . [MAR Hold] PARoxetine  40 mg Oral Daily  . sodium chloride flush  3 mL Intravenous Q12H   Continuous Infusions: . sodium chloride 75 mL/hr at 02/23/19 0824  . sodium chloride    . [START ON 02/24/2019] sodium chloride 3 mL/kg/hr (02/23/19 1117)   Followed by  . [START ON 02/24/2019] sodium chloride    . heparin 1,050 Units/hr (02/23/19 0914)   PRN Meds: sodium chloride, [MAR Hold] acetaminophen, [MAR Hold] albuterol, [MAR Hold] Butalbital-APAP-Caffeine, [MAR Hold] hydrALAZINE, [MAR Hold]  morphine injection, [MAR Hold] nitroGLYCERIN, [MAR Hold] ondansetron (ZOFRAN) IV, [MAR Hold] polyethylene glycol, [MAR Hold] senna-docusate, sodium chloride flush   Vital Signs    Vitals:   02/23/19 1415 02/23/19 1430 02/23/19 1500 02/23/19 1515  BP: 90/75 120/73 99/66 93/64   Pulse: 75 78 75 74  Resp:  15    Temp:      TempSrc:      SpO2: 97% 96% 98% 97%  Weight:      Height:        Intake/Output Summary (Last 24 hours) at 02/23/2019 1524 Last data filed at 02/23/2019 0550 Gross per 24 hour  Intake 814.5 ml  Output 650 ml  Net 164.5 ml   Last 3 Weights 02/23/2019 02/22/2019  02/06/2019  Weight (lbs) 157 lb 3.2 oz 145 lb 140 lb  Weight (kg) 71.305 kg 65.772 kg 63.504 kg      Telemetry    Normal sinus rhythm- Personally Reviewed  ECG     - Personally Reviewed  Physical Exam   GEN: No acute distress.  Obese  neck: No JVD Cardiac: RRR, no murmurs, rubs, or gallops.  Respiratory: Clear to auscultation bilaterally. GI: Soft, nontender, non-distended  MS: No edema; No deformity. Neuro:  Nonfocal  Psych: Normal affect   Labs    High Sensitivity Troponin:   Recent Labs  Lab 02/06/19 0245 02/22/19 1250 02/22/19 1608 02/22/19 1835 02/22/19 2248  TROPONINIHS <2 180* 1,076* 1,519* 1,667*      Chemistry Recent Labs  Lab 02/22/19 1250 02/23/19 0730  NA 138 135  K 5.4* 5.2*  CL 104 104  CO2 22 25  GLUCOSE 175* 132*  BUN 34* 31*  CREATININE 1.38* 1.41*  CALCIUM 9.7 8.8*  GFRNONAA 41* 40*  GFRAA 48* 47*  ANIONGAP 12 6     Hematology Recent Labs  Lab 02/22/19 1250 02/23/19 0730  WBC 14.0* 12.0*  RBC 4.88 3.74*  HGB 13.9 11.0*  HCT 44.4 35.1*  MCV 91.0 93.9  MCH 28.5 29.4  MCHC 31.3 31.3  RDW 13.9 14.3  PLT 409*  311    BNP Recent Labs  Lab 02/22/19 1904  BNP 83.0     DDimer No results for input(s): DDIMER in the last 168 hours.   Radiology    DG Chest 2 View  Result Date: 02/22/2019 CLINICAL DATA:  Chest pain EXAM: CHEST - 2 VIEW COMPARISON:  February 06, 2019 chest radiograph and chest CT; chest radiograph November 02, 2018 FINDINGS: There is apparent atelectatic change in each lung base and lingula. There is no evident edema or consolidation. Heart is upper normal in size with pulmonary vascularity normal. No adenopathy. There is old trauma involving the proximal right humerus with remodeling. Postoperative changes noted in the lower cervical region. IMPRESSION: Areas of basilar and lingular atelectatic change, essentially stable. No evidence of edema or consolidation. Stable cardiac silhouette. No evident adenopathy.  Electronically Signed   By: Bretta Bang III M.D.   On: 02/22/2019 13:12   CARDIAC CATHETERIZATION  Result Date: 02/23/2019  Mid Cx lesion is 95% stenosed.  There is no aortic valve stenosis.     Cardiac Studies     Patient Profile     Whitney Bernard is a 59 y.o. female with a hx of diabetes, hypertension, hyperlipidemia, hypothyroidism, chronic pain disorder with narcotic abuse, CKD stage II-III, COVID-19 infection diagnosed in 10/2018, rheumatoid arthritis, migraine disorder, COPD secondary topriortobacco abuse, colitis, diverticulitis and depressionwho  who is being seen today for the evaluation of elevated troponin /NSTEMI   Assessment & Plan     1.  NSTEMI: Troponin up to 1600 Continued chest pain overnight and today Left heart catheterization today with stent placed to left circumflex --On aspirin, metoprolol, Effient, statin  2.  CKD stage II-III: Creatinine 1.4 BUN 31 Prior creatinine 1.13 in September 2020 We will need to monitor closely following catheterization Will need post procedure fluids  3.  HTN: Blood pressure low following sedation from procedure Continue metoprolol with hold parameters  4.  HLD -Intolerant to statin Will likely need to get approval for PCSK9 inhibitor  5.  Tobacco use: -Complete cessation is recommended Discussed with her again today   Total encounter time more than 25 minutes  Greater than 50% was spent in counseling and coordination of care with the patient   For questions or updates, please contact [facility] Please consult [email or web address] for contact info under        Signed, Julien Nordmann, MD  02/23/2019, 3:24 PM

## 2019-02-23 NOTE — Progress Notes (Signed)
ANTICOAGULATION CONSULT NOTE  Pharmacy Consult for heparin Indication: chest pain/ACS  Allergies  Allergen Reactions  . Almond Oil Hives  . Statins Other (See Comments)    Multiple statins    Patient Measurements: Height: 5\' 4"  (162.6 cm) Weight: 145 lb (65.8 kg) IBW/kg (Calculated) : 54.7 Heparin Dosing Weight: 65 kg  Vital Signs: Temp: 98.4 F (36.9 C) (01/06 0038) Temp Source: Oral (01/06 0038) BP: 129/78 (01/06 0037) Pulse Rate: 72 (01/06 0037)  Labs: Recent Labs    02/22/19 1250 02/22/19 1608 02/22/19 1835 02/22/19 2248 02/23/19 0009  HGB 13.9  --   --   --   --   HCT 44.4  --   --   --   --   PLT 409*  --   --   --   --   APTT  --  29  --   --   --   LABPROT  --  13.4  --   --   --   INR  --  1.0  --   --   --   HEPARINUNFRC  --   --   --   --  <0.10*  CREATININE 1.38*  --   --   --   --   TROPONINIHS 180* 1,076* 1,519* 1,667*  --     Estimated Creatinine Clearance: 40.4 mL/min (A) (by C-G formula based on SCr of 1.38 mg/dL (H)).   Medical History: Past Medical History:  Diagnosis Date  . Acute pancreatitis   . Chronic pain syndrome   . COPD (chronic obstructive pulmonary disease) (HCC)   . Depression   . Diabetes mellitus without complication (HCC)   . Headache(784.0)    migraines  . Hypercholesteremia   . Hypertension   . Hyperthyroidism   . Hypokalemia   . Migraine   . Narcotic abuse (HCC)   . RA (rheumatoid arthritis) [facility])     Assessment: 59 year old female presented with chest pain. Pharmacy consulted for heparin dosing.  Goal of Therapy:  Heparin level 0.3-0.7 units/ml Monitor platelets by anticoagulation protocol: Yes   Plan:  0106 0009 HL < 0.10, confirmed with RN no problems with infusion.  Will rebolus with 2000 units x 1 then increase heparin to 950 units/hr, recheck heparin level in 6 hours  67, PharmD 02/23/2019,1:22 AM

## 2019-02-23 NOTE — Progress Notes (Signed)
*  PRELIMINARY RESULTS* Echocardiogram 2D Echocardiogram has been performed.  Cristela Blue 02/23/2019, 9:50 AM

## 2019-02-23 NOTE — Progress Notes (Signed)
ANTICOAGULATION CONSULT NOTE  Pharmacy Consult for heparin Indication: chest pain/ACS  Allergies  Allergen Reactions  . Almond Oil Hives  . Statins Other (See Comments)    Multiple statins    Patient Measurements: Height: 5\' 4"  (162.6 cm) Weight: 157 lb 3.2 oz (71.3 kg) IBW/kg (Calculated) : 54.7 Heparin Dosing Weight: 65 kg  Vital Signs: Temp: 98.9 F (37.2 C) (01/06 0747) Temp Source: Oral (01/06 0747) BP: 101/63 (01/06 0817) Pulse Rate: 75 (01/06 0817)  Labs: Recent Labs    02/22/19 1250 02/22/19 1608 02/22/19 1835 02/22/19 2248 02/23/19 0009 02/23/19 0730  HGB 13.9  --   --   --   --   --   HCT 44.4  --   --   --   --   --   PLT 409*  --   --   --   --   --   APTT  --  29  --   --   --   --   LABPROT  --  13.4  --   --   --   --   INR  --  1.0  --   --   --   --   HEPARINUNFRC  --   --   --   --  <0.10* 0.15*  CREATININE 1.38*  --   --   --   --  1.41*  TROPONINIHS 180* 1,076* 1,519* 1,667*  --   --     Estimated Creatinine Clearance: 41.1 mL/min (A) (by C-G formula based on SCr of 1.41 mg/dL (H)).   Medical History: Past Medical History:  Diagnosis Date  . Acute pancreatitis   . Chronic pain syndrome   . COPD (chronic obstructive pulmonary disease) (HCC)   . Depression   . Diabetes mellitus without complication (HCC)   . Headache(784.0)    migraines  . Hypercholesteremia   . Hypertension   . Hyperthyroidism   . Hypokalemia   . Migraine   . Narcotic abuse (HCC)   . RA (rheumatoid arthritis) [facility])     Assessment: 59 year old female presented with chest pain. Pharmacy consulted for heparin dosing.  1/6 0009 HL < 0.1  rebolus with 2000 units x 1 then increase heparin to 950 units/hr, 1/6 0730 HL 0.15  rebolus with 2000 units x 1 then increase heparin to 1050 units/hr  Goal of Therapy:  Heparin level 0.3-0.7 units/ml Monitor platelets by anticoagulation protocol: Yes   Plan:  Heparin level is subtherapeutic. Will rebolus with 2000 units x  1 then increase heparin to 1050 units/hr, recheck heparin level in 6 hours. CBC daily.   67, PharmD, BCPS 02/23/2019,8:25 AM

## 2019-02-24 ENCOUNTER — Encounter: Payer: Self-pay | Admitting: Cardiology

## 2019-02-24 DIAGNOSIS — D649 Anemia, unspecified: Secondary | ICD-10-CM

## 2019-02-24 DIAGNOSIS — R42 Dizziness and giddiness: Secondary | ICD-10-CM

## 2019-02-24 DIAGNOSIS — J432 Centrilobular emphysema: Secondary | ICD-10-CM

## 2019-02-24 LAB — COMPREHENSIVE METABOLIC PANEL
ALT: 24 U/L (ref 0–44)
AST: 21 U/L (ref 15–41)
Albumin: 3.2 g/dL — ABNORMAL LOW (ref 3.5–5.0)
Alkaline Phosphatase: 76 U/L (ref 38–126)
Anion gap: 8 (ref 5–15)
BUN: 27 mg/dL — ABNORMAL HIGH (ref 6–20)
CO2: 25 mmol/L (ref 22–32)
Calcium: 8.8 mg/dL — ABNORMAL LOW (ref 8.9–10.3)
Chloride: 105 mmol/L (ref 98–111)
Creatinine, Ser: 1.28 mg/dL — ABNORMAL HIGH (ref 0.44–1.00)
GFR calc Af Amer: 53 mL/min — ABNORMAL LOW (ref >60)
GFR calc non Af Amer: 45 mL/min — ABNORMAL LOW (ref >60)
Glucose, Bld: 124 mg/dL — ABNORMAL HIGH (ref 70–99)
Potassium: 4.9 mmol/L (ref 3.5–5.1)
Sodium: 138 mmol/L (ref 135–145)
Total Bilirubin: 0.7 mg/dL (ref 0.3–1.2)
Total Protein: 6.3 g/dL — ABNORMAL LOW (ref 6.5–8.1)

## 2019-02-24 LAB — CBC
HCT: 31.9 % — ABNORMAL LOW (ref 36.0–46.0)
Hemoglobin: 9.9 g/dL — ABNORMAL LOW (ref 12.0–15.0)
MCH: 28.9 pg (ref 26.0–34.0)
MCHC: 31 g/dL (ref 30.0–36.0)
MCV: 93.3 fL (ref 80.0–100.0)
Platelets: 290 10*3/uL (ref 150–400)
RBC: 3.42 MIL/uL — ABNORMAL LOW (ref 3.87–5.11)
RDW: 14 % (ref 11.5–15.5)
WBC: 10.4 10*3/uL (ref 4.0–10.5)
nRBC: 0 % (ref 0.0–0.2)

## 2019-02-24 LAB — GLUCOSE, CAPILLARY
Glucose-Capillary: 115 mg/dL — ABNORMAL HIGH (ref 70–99)
Glucose-Capillary: 135 mg/dL — ABNORMAL HIGH (ref 70–99)
Glucose-Capillary: 98 mg/dL (ref 70–99)
Glucose-Capillary: 161 mg/dL — ABNORMAL HIGH (ref 70–99)

## 2019-02-24 LAB — MAGNESIUM: Magnesium: 1.8 mg/dL (ref 1.7–2.4)

## 2019-02-24 LAB — IRON AND TIBC
Iron: 67 ug/dL (ref 28–170)
Saturation Ratios: 20 % (ref 10.4–31.8)
TIBC: 340 ug/dL (ref 250–450)
UIBC: 273 ug/dL

## 2019-02-24 LAB — FERRITIN: Ferritin: 20 ng/mL (ref 11–307)

## 2019-02-24 MED ORDER — SODIUM CHLORIDE 0.9 % IV SOLN
500.0000 mL | Freq: Once | INTRAVENOUS | Status: AC
  Administered 2019-02-24: 500 mL via INTRAVENOUS

## 2019-02-24 MED ORDER — BUTALBITAL-APAP-CAFFEINE 50-325-40 MG PO TABS
1.0000 | ORAL_TABLET | Freq: Three times a day (TID) | ORAL | Status: DC | PRN
  Administered 2019-02-24 – 2019-02-25 (×3): 1 via ORAL
  Filled 2019-02-24 (×3): qty 1

## 2019-02-24 MED ORDER — OXYCODONE HCL 5 MG PO TABS
5.0000 mg | ORAL_TABLET | Freq: Once | ORAL | Status: AC
  Administered 2019-02-24: 11:00:00 5 mg via ORAL
  Filled 2019-02-24: qty 1

## 2019-02-24 MED ORDER — ROSUVASTATIN CALCIUM 10 MG PO TABS
10.0000 mg | ORAL_TABLET | Freq: Every day | ORAL | Status: DC
  Administered 2019-02-24: 19:00:00 10 mg via ORAL
  Filled 2019-02-24: qty 1

## 2019-02-24 MED ORDER — NICOTINE 14 MG/24HR TD PT24
14.0000 mg | MEDICATED_PATCH | Freq: Every day | TRANSDERMAL | Status: DC
  Administered 2019-02-24 – 2019-02-25 (×2): 14 mg via TRANSDERMAL
  Filled 2019-02-24 (×2): qty 1

## 2019-02-24 MED ORDER — MELATONIN 5 MG PO TABS
5.0000 mg | ORAL_TABLET | Freq: Every evening | ORAL | Status: DC | PRN
  Filled 2019-02-24: qty 1

## 2019-02-24 NOTE — Progress Notes (Signed)
Progress Note  Patient Name: Whitney Bernard Date of Encounter: 02/24/2019  Primary Cardiologist: Nelva Bush, MD  Subjective   No c/p or sob overnight.  Was sitting up earlier and felt acutely lightheaded.  BPs trending 90's.  C/o right groin discomfort and would like her usual home meds ordered (butalbital, soma, xanax).  Inpatient Medications    Scheduled Meds: . aspirin EC  81 mg Oral Daily  . busPIRone  10 mg Oral BID  . enoxaparin (LOVENOX) injection  40 mg Subcutaneous Q24H  . ezetimibe  10 mg Oral Daily  . influenza vac split quadrivalent PF  0.5 mL Intramuscular Tomorrow-1000  . insulin aspart  0-5 Units Subcutaneous QHS  . insulin aspart  0-9 Units Subcutaneous TID WC  . nitroGLYCERIN  1 inch Topical Q6H  . pantoprazole  40 mg Oral Daily  . PARoxetine  40 mg Oral Daily  . prasugrel  10 mg Oral Daily  . rosuvastatin  10 mg Oral q1800  . sodium chloride flush  3 mL Intravenous Q12H   Continuous Infusions: . sodium chloride    . sodium chloride 0.9% NICU IV bolus     PRN Meds: sodium chloride, acetaminophen, albuterol, Butalbital-APAP-Caffeine, Melatonin, morphine injection, nitroGLYCERIN, ondansetron (ZOFRAN) IV, polyethylene glycol, senna-docusate, sodium chloride flush   Vital Signs    Vitals:   02/23/19 1923 02/23/19 2103 02/24/19 0457 02/24/19 0736  BP: 94/64 103/67 (!) 92/58 106/64  Pulse: 78 86 67 69  Resp: 18  18   Temp: 98.9 F (37.2 C)  98.2 F (36.8 C) 98.5 F (36.9 C)  TempSrc: Oral  Oral Oral  SpO2: 94% 98% 98% 98%  Weight:   72.1 kg   Height:        Intake/Output Summary (Last 24 hours) at 02/24/2019 0949 Last data filed at 02/24/2019 0437 Gross per 24 hour  Intake 507.36 ml  Output 750 ml  Net -242.64 ml   Filed Weights   02/22/19 1244 02/23/19 0037 02/24/19 0457  Weight: 65.8 kg 71.3 kg 72.1 kg    Physical Exam   GEN: Well nourished, well developed, in no acute distress.  HEENT: Grossly normal.  Neck: Supple, no JVD, carotid  bruits, or masses. Cardiac: RRR, no murmurs, rubs, or gallops. No clubbing, cyanosis, edema.  Radials/DP/PT 2+ and equal bilaterally. R groin cath site mildly ecchymotic w/o bleeding/bruit/hematoma. Respiratory:  Respirations regular and unlabored, clear to auscultation bilaterally. GI: Soft, nontender, nondistended, BS + x 4. MS: no deformity or atrophy. Skin: warm and dry, no rash. Neuro:  Strength and sensation are intact. Psych: AAOx3.  Normal affect.  Labs    Chemistry Recent Labs  Lab 02/22/19 1250 02/23/19 0730 02/24/19 0546  NA 138 135 138  K 5.4* 5.2* 4.9  CL 104 104 105  CO2 22 25 25   GLUCOSE 175* 132* 124*  BUN 34* 31* 27*  CREATININE 1.38* 1.41* 1.28*  CALCIUM 9.7 8.8* 8.8*  PROT  --   --  6.3*  ALBUMIN  --   --  3.2*  AST  --   --  21  ALT  --   --  24  ALKPHOS  --   --  76  BILITOT  --   --  0.7  GFRNONAA 41* 40* 45*  GFRAA 48* 47* 53*  ANIONGAP 12 6 8      Hematology Recent Labs  Lab 02/22/19 1250 02/23/19 0730 02/24/19 0546  WBC 14.0* 12.0* 10.4  RBC 4.88 3.74* 3.42*  HGB 13.9  11.0* 9.9*  HCT 44.4 35.1* 31.9*  MCV 91.0 93.9 93.3  MCH 28.5 29.4 28.9  MCHC 31.3 31.3 31.0  RDW 13.9 14.3 14.0  PLT 409* 311 290    Cardiac Enzymes  Recent Labs  Lab 02/06/19 0245 02/22/19 1250 02/22/19 1608 02/22/19 1835 02/22/19 2248  TROPONINIHS <2 180* 1,076* 1,519* 1,667*      BNP Recent Labs  Lab 02/22/19 1904  BNP 83.0      Radiology    DG Chest 2 View  Result Date: 02/22/2019 CLINICAL DATA:  Chest pain EXAM: CHEST - 2 VIEW COMPARISON:  February 06, 2019 chest radiograph and chest CT; chest radiograph November 02, 2018 FINDINGS: There is apparent atelectatic change in each lung base and lingula. There is no evident edema or consolidation. Heart is upper normal in size with pulmonary vascularity normal. No adenopathy. There is old trauma involving the proximal right humerus with remodeling. Postoperative changes noted in the lower cervical  region. IMPRESSION: Areas of basilar and lingular atelectatic change, essentially stable. No evidence of edema or consolidation. Stable cardiac silhouette. No evident adenopathy. Electronically Signed   By: Bretta Bang III M.D.   On: 02/22/2019 13:12   Telemetry    RSR - Personally Reviewed  ECG    RSR, 71, borderline LAE - Personally Reviewed  Cardiac Studies   2D Echocardiogram 1.6.2021  1. Left ventricular ejection fraction, by visual estimation, is 60 to 65%. The left ventricle has normal function. There is no left ventricular hypertrophy.  2. The left ventricle has no regional wall motion abnormalities.  3. Left ventricular diastolic parameters are indeterminate.  4. Global right ventricle has normal systolic function.The right ventricular size is normal. No increase in right ventricular wall thickness.  5. Left atrial size was normal.  6. TR signal is inadequate for assessing pulmonary artery systolic pressure. _____________   Cardiac Catheterization and Percutaneous Coronary Intervention 1.6.2021  Left mainstem: Large vessel that bifurcates into the LAD and left circumflex, no significant disease noted  Left anterior descending (LAD): Large vessel that extends to the apical region, diagonal branch 2 of moderate size, no significant disease noted  Left circumflex (LCx): Large vessel with distal OM branch , 95% proximal to mid LCX disease (likely culprit vessel) **Successful PCI to the mid LCx using Resolute Onyx 2.75 x 15 mm drug-eluting stent   Right coronary artery (RCA): Right dominant vessel with PL and PDA, no significant disease noted  Left ventriculography: no significant aortic valve stenosis _____________    Patient Profile     Whitney Bernard a 59 y.o.femalewith a hx of diabetes, hypertension, hyperlipidemia, hypothyroidism, chronic pain disorder with narcotic abuse, CKD stage II-III,COVID-19 infection diagnosed in 10/2018,rheumatoid arthritis, migraine  disorder, COPD secondary topriortobacco abuse, colitis, diverticulitis and depressionwhowho was admitted 1/5 w/ c/p and NSTEMI.  Assessment & Plan    1.  NSTEMI/CAD:  Peak HsTrop 1667.  S/p cath revealing severe LCX dzs, now s/p DES.  No c/p or dyspnea overnight, but lightheaded this AM in the setting of soft bp's.  Hold  blocker.  Saline bolus ordered.  Cont asa, prasugrel, zetia.  No statin due to prior intolerance, though on further discussion, she has only ever tried atorvastatin.  Willing to try rosuvastatin and we will add today (10mg ).  LDL 178  she will most likely need pcsk9i as outpt regardless of tolerance of rosuvastatin.  Plan outpt cardiac rehab post-dc.   2.  Essential HTN:  BP soft this AM.  Holding  blocker in the setting of LH and relative hypotension.    3.  Lightheadedness/relative hypotension:  Hold  blocker.  Saline bolus ordered.  H/H drifting down.  Groin w/o evidence of bleeding/bruit.  Guaiac stools.    4.  Normocytic anemia:  H/H 13.9/44.4 on admission.  Now 9.9/31.9. Groin stable.  No stool since admission.  On PPI for h/o GERD.  Guaiac stools ordered.  Hydrating today in the setting of LH and relative hypotension.  Would hold off on d/c in order to f/u H/H in AM to assess stability.  5.  HL:  LDL 178. Statin intolerant.  On zetia.  Will need pcsk9i as outpt.  6.  CKD II-III: creat stable post-cath.  7. Tob Abuse:  Cessation advised.  8.  Chronic pain/anxiety:  Says that she takes butalbital, soma, and xanax @ home - would like these resumed.  Defer to medicine.     Signed, Nicolasa Ducking, NP  02/24/2019, 9:49 AM    For questions or updates, please contact   Please consult [email or web address] for contact info under Cardiology/STEMI.

## 2019-02-24 NOTE — Progress Notes (Signed)
Cardiovascular and Pulmonary Nurse Navigator Note:    59 year old female with a history of diabetes, hypertension, hyperlipidemia, hypothyroidism, chronic pain disorder with narcotic abuse, CKD stage II-III, Covid-19 infection diagnosed in 10/2018. Rheumatoid arthritiis, migraine disorder, COPD secondary to tobacco abuse, colitis, diverticulitis and depression who presented to the ED with chest pain.  Patient ruled in for NSTEMI.    Whitney Merritts, MD (Primary)  Whitney Mu, PA-C  Procedures Date:  02/23/2019 LEFT HEART CATH AND CORONARY ANGIOGRAPHY  Conclusion  Mid Cx lesion is 95% stenosed. There is no aortic valve stenosis.    CORONARY STENT INTERVENTION  Conclusion Date:  02/23/2019 Conclusions: Severe single-vessel coronary artery disease with 95% stenosis of the mid LCx.  See diagnostic catheterization report by Dr. Rockey Situ for further details. Successful PCI to the mid LCx using Resolute Onyx 2.75 x 15 mm drug-eluting stent (postdilated to 3.1 mm) with 0% residual stenosis and TIMI-3 flow.   Recommendations: Dual antiplatelet therapy with aspirin and prasugrel for at least 12 months. Aggressive secondary prevention.   Whitney Bush, MD [facility]     EDUCATION:   "Heart Attack Bouncing Back" booklet given and reviewed with patient. Discussed the definition of CAD. Reviewed the location of CAD and where her stent was placed. Informed patient she will be given a stent card. Explained the purpose of the stent card. Instructed patient to keep stent card in her wallet.  ? Discussed modifiable risk factors including controlling blood pressure, cholesterol, and blood sugar; following heart healthy diet; maintaining healthy weight; exercise; and smoking cessation.   ? Discussed cardiac medications including rationale for taking, mechanisms of action, and side effects. Stressed the importance of taking medications as prescribed.  ? Discussed emergency plan for heart attack  symptoms. Patient verbalized understanding of need to call 911 and not to drive herself to ER if having cardiac symptoms / chest pain.    Diet of low sodium, low fat, low cholesterol heart healthy / carb modified diet discussed. Information on diet provided. ? Smoking Cessation - Patient is a CURRENT every day smoker. Patient has been advised to quit by her cardiologist.  Patient reports smoking .75 to 1 pack per day prior to admission.  Patient reported that she quit for approximately 3 weeks once and then started back.   Extended discussion with patient about preparing to quit:   cleaning her environment; thinking about what she is going to do when she goes home for those times she used to smoke - sugar free sour balls, chewing on a straw, taking a walk, etc.; committing to not bumming cigarettes, etc.    ? Exercise - Benefits of exercised discussed. Patient does not currently exercise.   Informed patient her cardiologist has referred her to outpatient Cardiac Rehab. An overview of the program was provided.   Informational letter with CPT billing codes given to patient. Patient is interested in participating. Patient plans to check with her insurance company to see what her out-of-pocket expenses will be.   This RN explained to patient given the Covid-19 pandemic the program is a mix of virtual as well as on site.  Patient verbalized understanding. Patient agreeable to the Cardiac Rehab department contacting her by phone within one to two weeks after discharge to schedule her first appointment.    Patient appreciative of the information.  ? Roanna Epley, RN, BSN, [facility]  Cardiovascular & Pulmonary Nurse Navigator  Direct Line: [phone]  Department Phone #: [phone] Fax: [phone]  Email Address: [email or web address]

## 2019-02-24 NOTE — Progress Notes (Signed)
End of Shift Summary:  Date: 02/24/19 Shift: 0700-1900 Ambulatory: up ad lib Significant Events: Patient requested pain medication for femoral site and given one time dose oxycodone. Patient later complained she had not been given her butalbital since coming into the hospital. Writer explained to patient the medication was ordered PRN and she would need to ask for it if she felt she needed it. Patient then requested a dose and requested a dose "on hold" from pharmacy so it would be available for tonight. Pt explained she "knows I'll need it tonight." Patient expressed frustration at not having xanax and soma, Dr. Nelson Chimes explained we were holding it r/t her hypotension. Patient states she feels we are "giving me the run around." When writer asked patient to elaborate, she stated "please just leave." Writer will assess pain and give ordered pain medications accordingly throughout shift.

## 2019-02-24 NOTE — Progress Notes (Signed)
PROGRESS NOTE    Whitney Bernard  SEG:[id number] DOB: 04/12/1960 DOA: 02/22/2019 PCP: Lorelee Market, MD   Brief Narrative:   59 year old with history of HTN, HLD, DM2, COPD, GERD, depression, rheumatoid arthritis, migraine, hypothyroidism, COVID-19 infection in September 2020, CKD stage III presented to the ER with chest pain.  Chest x-ray showed atelectasis, troponin of 80, EKG with T wave inversion in inferior leads.  Left heart catheterization 1/6 showed severe single-vessel coronary artery disease with successful PCI with drug-eluting stent to LCx.  With stratification and medical management per cardiology team.  Assessment & Plan:   Principal Problem:   Chest pain Active Problems:   Depression with anxiety   Hypercholesterolemia   COPD (chronic obstructive pulmonary disease) (HCC)   Hypertension   CKD (chronic kidney disease), stage IIIa   NSTEMI (non-ST elevated myocardial infarction) (HCC)   Hyperkalemia   Elevated troponin   GERD (gastroesophageal reflux disease)   Type II diabetes mellitus with renal manifestations ([place])  Typical chest pain concerning for NSTEMI Coronary artery disease status post PCI/DES LCx -Status post left heart catheterization this morning status post PCI/drug-eluting stent to LCx -Aspirin and prasugrel -LDL 178, previous allergy to statin but for now willing to try Crestor.  If develops side effect of this, she will require PCSK9 inhibitor -A1c 6.5. -Echo-normal EF  Dizziness -Suspect intravascular volume depletion number we will also monitor given adjustment in her cardiac medications 500 cc of fluid ordered  Anemia -Hemoglobin down to 9.9 from 11.  Suspect delusional.  We will continue to monitor.  No obvious signs of bleeding. -Check iron studies  Hyperlipidemia -Elevated LDL 178, trial of Crestor, continue Zetia.  If develops side effects to statin, will need PCSK9 inhibitor.  Diabetes mellitus type 2 -Hemoglobin A1c 6.5.  Diabetic  coordinator consult.  Essential hypertension -Home meds.  Bradycardia medications to be tested per cardiology team  CKD stage III -Around baseline of 1-10.  Closely monitor.  GERD -PPI  DVT prophylaxis: Heparin drip, once stopped, transition to Lovenox Code Status:  Family Communication: Full code none Disposition Plan: Still feeling dizzy dialysis unsure if this is secondary to intravascular volume depletion versus medication adjustments.  Unsafe for discharge today.  Consultants:   Cardiology  Procedures:   LHC/RHC-1/6  Antimicrobials:      Subjective: Seen and examined at bedside, she was complaining of generalized dizziness this morning causing some difficulty walking.  Review of Systems Otherwise negative except as per HPI, including: General = no fevers, chills, dizziness, malaise, fatigue HEENT/EYES = negative for pain, redness, loss of vision, double vision, blurred vision, loss of hearing, sore throat, hoarseness, dysphagia Cardiovascular= negative for chest pain, palpitation, murmurs, lower extremity swelling Respiratory/lungs= negative for shortness of breath, cough, hemoptysis, wheezing, mucus production Gastrointestinal= negative for nausea, vomiting,, abdominal pain, melena, hematemesis Genitourinary= negative for Dysuria, Hematuria, Change in Urinary Frequency MSK = Negative for arthralgia, myalgias, Back Pain, Joint swelling  Neurology= Negative for headache, seizures, numbness, tingling  Psychiatry= Negative for anxiety, depression, suicidal and homocidal ideation Allergy/Immunology= Medication/Food allergy as listed  Skin= Negative for Rash, lesions, ulcers, itching  Objective: Vitals:   02/23/19 1923 02/23/19 2103 02/24/19 0457 02/24/19 0736  BP: 94/64 103/67 (!) 92/58 106/64  Pulse: 78 86 67 69  Resp: 18  18   Temp: 98.9 F (37.2 C)  98.2 F (36.8 C) 98.5 F (36.9 C)  TempSrc: Oral  Oral Oral  SpO2: 94% 98% 98% 98%  Weight:   72.1 kg  Height:        Intake/Output Summary (Last 24 hours) at 02/24/2019 1404 Last data filed at 02/24/2019 0437 Gross per 24 hour  Intake 507.36 ml  Output 750 ml  Net -242.64 ml   Filed Weights   02/22/19 1244 02/23/19 0037 02/24/19 0457  Weight: 65.8 kg 71.3 kg 72.1 kg    Examination: Constitutional: Not in acute distress Respiratory: Clear to auscultation bilaterally Cardiovascular: Normal sinus rhythm, no rubs Abdomen: Nontender nondistended good bowel sounds Musculoskeletal: No edema noted Skin: No rashes seen Neurologic: CN 2-12 grossly intact.  And nonfocal Psychiatric: Normal judgment and insight. Alert and oriented x 3. Normal mood.     Data Reviewed:   CBC: Recent Labs  Lab 02/22/19 1250 02/23/19 0730 02/24/19 0546  WBC 14.0* 12.0* 10.4  HGB 13.9 11.0* 9.9*  HCT 44.4 35.1* 31.9*  MCV 91.0 93.9 93.3  PLT 409* 311 290   Basic Metabolic Panel: Recent Labs  Lab 02/22/19 1250 02/23/19 0730 02/24/19 0546  NA 138 135 138  K 5.4* 5.2* 4.9  CL 104 104 105  CO2 22 25 25   GLUCOSE 175* 132* 124*  BUN 34* 31* 27*  CREATININE 1.38* 1.41* 1.28*  CALCIUM 9.7 8.8* 8.8*  MG  --   --  1.8   GFR: Estimated Creatinine Clearance: 45.5 mL/min (A) (by C-G formula based on SCr of 1.28 mg/dL (H)). Liver Function Tests: Recent Labs  Lab 02/24/19 0546  AST 21  ALT 24  ALKPHOS 76  BILITOT 0.7  PROT 6.3*  ALBUMIN 3.2*   No results for input(s): LIPASE, AMYLASE in the last 168 hours. No results for input(s): AMMONIA in the last 168 hours. Coagulation Profile: Recent Labs  Lab 02/22/19 1608  INR 1.0   Cardiac Enzymes: No results for input(s): CKTOTAL, CKMB, CKMBINDEX, TROPONINI in the last 168 hours. BNP (last 3 results) No results for input(s): PROBNP in the last 8760 hours. HbA1C: Recent Labs    02/23/19 0730  HGBA1C 6.5*   CBG: Recent Labs  Lab 02/23/19 1054 02/23/19 1639 02/23/19 2108 02/24/19 0738 02/24/19 1244  GLUCAP 115* 138* 173* 115* 135*    Lipid Profile: Recent Labs    02/23/19 0730  CHOL 288*  HDL 33*  LDLCALC 178*  TRIG 384*  CHOLHDL 8.7   Thyroid Function Tests: No results for input(s): TSH, T4TOTAL, FREET4, T3FREE, THYROIDAB in the last 72 hours. Anemia Panel: No results for input(s): VITAMINB12, FOLATE, FERRITIN, TIBC, IRON, RETICCTPCT in the last 72 hours. Sepsis Labs: No results for input(s): PROCALCITON, LATICACIDVEN in the last 168 hours.  Recent Results (from the past 240 hour(s))  SARS CORONAVIRUS 2 (TAT 6-24 HRS) Nasopharyngeal Nasopharyngeal Swab     Status: None   Collection Time: 02/22/19  1:48 PM   Specimen: Nasopharyngeal Swab  Result Value Ref Range Status   SARS Coronavirus 2 NEGATIVE NEGATIVE Final    Comment: (NOTE) SARS-CoV-2 target nucleic acids are NOT DETECTED. The SARS-CoV-2 RNA is generally detectable in upper and lower respiratory specimens during the acute phase of infection. Negative results do not preclude SARS-CoV-2 infection, do not rule out co-infections with other pathogens, and should not be used as the sole basis for treatment or other patient management decisions. Negative results must be combined with clinical observations, patient history, and epidemiological information. The expected result is Negative. Fact Sheet for Patients: 04/22/19 Fact Sheet for Healthcare Providers: HairSlick.no This test is not yet approved or cleared by the quierodirigir.[employer] FDA and  has been  authorized for detection and/or diagnosis of SARS-CoV-2 by FDA under an Emergency Use Authorization (EUA). This EUA will remain  in effect (meaning this test can be used) for the duration of the COVID-19 declaration under Section 56 4(b)(1) of the Act, 21 U.S.C. section 360bbb-3(b)(1), unless the authorization is terminated or revoked sooner. Performed at [facility], [address], [place], [place] [address]           Radiology Studies: CARDIAC CATHETERIZATION  Result Date: 02/23/2019 Conclusions: 1. Severe single-vessel coronary artery disease with 95% stenosis of the mid LCx.  See diagnostic catheterization report by Dr. Mariah Milling for further details. 2. Successful PCI to the mid LCx using Resolute Onyx 2.75 x 15 mm drug-eluting stent (postdilated to 3.1 mm) with 0% residual stenosis and TIMI-3 flow. Recommendations: 1. Dual antiplatelet therapy with aspirin and prasugrel for at least 12 months. 2. Aggressive secondary prevention. Yvonne Kendall, MD [facility]   CARDIAC CATHETERIZATION  Result Date: 02/23/2019  Mid Cx lesion is 95% stenosed.  There is no aortic valve stenosis.    ECHOCARDIOGRAM COMPLETE  Result Date: 02/23/2019   ECHOCARDIOGRAM REPORT   Patient Name:   Whitney Bernard Date of Exam: 02/23/2019 Medical Rec #:  [id number]   Height:       64.0 in Accession #:    [id number]  Weight:       157.2 lb Date of Birth:  04-Dec-1960   BSA:          1.77 m Patient Age:    58 years    BP:           101/63 mmHg Patient Gender: F           HR:           75 bpm. Exam Location:  [facility] Procedure: 2D Echo, Cardiac Doppler and Color Doppler Indications:     Acute coronary syndrome 124.9  History:         Patient has prior history of Echocardiogram examinations, most                  recent 05/02/2018. COPD; Risk Factors:Hypertension and Diabetes.  Sonographer:     Cristela Blue RDCS (AE) Referring Phys:  [phone] [facility]STOP[facility] END Diagnosing Phys: Julien Nordmann MD IMPRESSIONS  1. Left ventricular ejection fraction, by visual estimation, is 60 to 65%. The left ventricle has normal function. There is no left ventricular hypertrophy.  2. The left ventricle has no regional wall motion abnormalities.  3. Left ventricular diastolic parameters are indeterminate.  4. Global right ventricle has normal systolic function.The right ventricular size is normal. No increase in right ventricular wall thickness.  5. Left atrial size was  normal.  6. TR signal is inadequate for assessing pulmonary artery systolic pressure. FINDINGS  Left Ventricle: Left ventricular ejection fraction, by visual estimation, is 60 to 65%. The left ventricle has normal function. The left ventricle has no regional wall motion abnormalities. There is no left ventricular hypertrophy. Left ventricular diastolic parameters are indeterminate. Normal left atrial pressure. Right Ventricle: The right ventricular size is normal. No increase in right ventricular wall thickness. Global RV systolic function is has normal systolic function. The tricuspid regurgitant velocity is 1.45 m/s, and with an assumed right atrial pressure  of 10 mmHg, the estimated right ventricular systolic pressure is TR signal is inadequate for assessing PA pressure at 18.4 mmHg. Left Atrium: Left atrial size was normal in size. Right Atrium: Right atrial size was normal  in size Pericardium: There is no evidence of pericardial effusion. Mitral Valve: The mitral valve is normal in structure. No evidence of mitral valve regurgitation. No evidence of mitral valve stenosis by observation. Tricuspid Valve: The tricuspid valve is normal in structure. Tricuspid valve regurgitation is not demonstrated. Aortic Valve: The aortic valve is normal in structure. Aortic valve regurgitation is not visualized. The aortic valve is structurally normal, with no evidence of sclerosis or stenosis. Pulmonic Valve: The pulmonic valve was normal in structure. Pulmonic valve regurgitation is not visualized. Pulmonic regurgitation is not visualized. Aorta: The aortic root, ascending aorta and aortic arch are all structurally normal, with no evidence of dilitation or obstruction. Venous: The inferior vena cava is normal in size with greater than 50% respiratory variability, suggesting right atrial pressure of 3 mmHg. IAS/Shunts: No atrial level shunt detected by color flow Doppler. There is no evidence of a patent foramen ovale. No  ventricular septal defect is seen or detected. There is no evidence of an atrial septal defect.  LEFT VENTRICLE PLAX 2D LVIDd:         4.39 cm LVIDs:         2.72 cm LV PW:         1.15 cm LV IVS:        1.11 cm LVOT diam:     1.90 cm LV SV:         60 ml LV SV Index:   32.96 LVOT Area:     2.84 cm  LEFT ATRIUM         Index LA diam:    3.00 cm 1.70 cm/m                        PULMONIC VALVE AORTA                 PV Vmax:        0.73 m/s Ao Root diam: 3.00 cm PV Peak grad:   2.1 mmHg                       RVOT Peak grad: 3 mmHg  TRICUSPID VALVE TR Peak grad:   8.4 mmHg TR Vmax:        145.00 cm/s  SHUNTS Systemic Diam: 1.90 cm  Julien Nordmann MD Electronically signed by Julien Nordmann MD Signature Date/Time: 02/23/2019/6:27:20 PM    Final         Scheduled Meds: . aspirin EC  81 mg Oral Daily  . busPIRone  10 mg Oral BID  . enoxaparin (LOVENOX) injection  40 mg Subcutaneous Q24H  . ezetimibe  10 mg Oral Daily  . influenza vac split quadrivalent PF  0.5 mL Intramuscular Tomorrow-1000  . insulin aspart  0-5 Units Subcutaneous QHS  . insulin aspart  0-9 Units Subcutaneous TID WC  . nicotine  14 mg Transdermal Daily  . nitroGLYCERIN  1 inch Topical Q6H  . pantoprazole  40 mg Oral Daily  . PARoxetine  40 mg Oral Daily  . prasugrel  10 mg Oral Daily  . rosuvastatin  10 mg Oral q1800  . sodium chloride flush  3 mL Intravenous Q12H   Continuous Infusions: . sodium chloride       LOS: 2 days   Time spent= 35 mins    Ranita Stjulien Joline Maxcy, MD [facility]  If 7PM-7AM, please contact night-coverage  02/24/2019, 2:04 PM

## 2019-02-25 ENCOUNTER — Other Ambulatory Visit: Payer: Self-pay | Admitting: Physician Assistant

## 2019-02-25 DIAGNOSIS — D509 Iron deficiency anemia, unspecified: Secondary | ICD-10-CM

## 2019-02-25 DIAGNOSIS — D649 Anemia, unspecified: Secondary | ICD-10-CM

## 2019-02-25 LAB — COMPREHENSIVE METABOLIC PANEL
ALT: 21 U/L (ref 0–44)
AST: 17 U/L (ref 15–41)
Albumin: 3.1 g/dL — ABNORMAL LOW (ref 3.5–5.0)
Alkaline Phosphatase: 69 U/L (ref 38–126)
Anion gap: 9 (ref 5–15)
BUN: 17 mg/dL (ref 6–20)
CO2: 25 mmol/L (ref 22–32)
Calcium: 8.4 mg/dL — ABNORMAL LOW (ref 8.9–10.3)
Chloride: 105 mmol/L (ref 98–111)
Creatinine, Ser: 1.3 mg/dL — ABNORMAL HIGH (ref 0.44–1.00)
GFR calc Af Amer: 52 mL/min — ABNORMAL LOW (ref >60)
GFR calc non Af Amer: 45 mL/min — ABNORMAL LOW (ref >60)
Glucose, Bld: 126 mg/dL — ABNORMAL HIGH (ref 70–99)
Potassium: 3.8 mmol/L (ref 3.5–5.1)
Sodium: 139 mmol/L (ref 135–145)
Total Bilirubin: 0.6 mg/dL (ref 0.3–1.2)
Total Protein: 6.4 g/dL — ABNORMAL LOW (ref 6.5–8.1)

## 2019-02-25 LAB — CBC
HCT: 31.1 % — ABNORMAL LOW (ref 36.0–46.0)
Hemoglobin: 9.6 g/dL — ABNORMAL LOW (ref 12.0–15.0)
MCH: 28.4 pg (ref 26.0–34.0)
MCHC: 30.9 g/dL (ref 30.0–36.0)
MCV: 92 fL (ref 80.0–100.0)
Platelets: 251 10*3/uL (ref 150–400)
RBC: 3.38 MIL/uL — ABNORMAL LOW (ref 3.87–5.11)
RDW: 13.8 % (ref 11.5–15.5)
WBC: 8.8 10*3/uL (ref 4.0–10.5)
nRBC: 0 % (ref 0.0–0.2)

## 2019-02-25 LAB — GLUCOSE, CAPILLARY: Glucose-Capillary: 189 mg/dL — ABNORMAL HIGH (ref 70–99)

## 2019-02-25 LAB — MAGNESIUM: Magnesium: 1.8 mg/dL (ref 1.7–2.4)

## 2019-02-25 MED ORDER — EZETIMIBE 10 MG PO TABS: 10.0000 mg | ORAL_TABLET | Freq: Every day | ORAL | 0 refills | Status: DC

## 2019-02-25 MED ORDER — PRASUGREL HCL 10 MG PO TABS: 10.0000 mg | ORAL_TABLET | Freq: Every day | ORAL | 3 refills | Status: DC

## 2019-02-25 MED ORDER — SENNOSIDES-DOCUSATE SODIUM 8.6-50 MG PO TABS: 2.0000 | ORAL_TABLET | Freq: Every evening | ORAL | 0 refills | Status: AC | PRN

## 2019-02-25 MED ORDER — FERROUS SULFATE 325 (65 FE) MG PO TABS
325.0000 mg | ORAL_TABLET | Freq: Two times a day (BID) | ORAL | Status: DC
  Administered 2019-02-25: 10:00:00 325 mg via ORAL
  Filled 2019-02-25: qty 1

## 2019-02-25 MED ORDER — ASPIRIN 81 MG PO TBEC: 81.0000 mg | DELAYED_RELEASE_TABLET | Freq: Every day | ORAL | 0 refills | Status: AC

## 2019-02-25 MED ORDER — FERROUS SULFATE 325 (65 FE) MG PO TABS: 325.0000 mg | ORAL_TABLET | Freq: Two times a day (BID) | ORAL | 0 refills | Status: DC

## 2019-02-25 MED ORDER — ROSUVASTATIN CALCIUM 10 MG PO TABS: 10.0000 mg | ORAL_TABLET | Freq: Every day | ORAL | 0 refills | Status: DC

## 2019-02-25 NOTE — Plan of Care (Signed)
  Problem: Education: Goal: Knowledge of General Education information will improve Description: Including pain rating scale, medication(s)/side effects and non-pharmacologic comfort measures Outcome: Progressing   Problem: Health Behavior/Discharge Planning: Goal: Ability to manage health-related needs will improve Outcome: Progressing   Problem: Clinical Measurements: Goal: Ability to maintain clinical measurements within normal limits will improve Outcome: Progressing Goal: Diagnostic test results will improve Outcome: Progressing Goal: Cardiovascular complication will be avoided Outcome: Progressing   Problem: Coping: Goal: Level of anxiety will decrease Outcome: Progressing   Problem: Pain Managment: Goal: General experience of comfort will improve Outcome: Progressing   

## 2019-02-25 NOTE — Discharge Summary (Signed)
Physician Discharge Summary  Whitney Bernard EXB:[id number] DOB: 1960-10-14 DOA: 02/22/2019  PCP: Evelene Croon, MD  Admit date: 02/22/2019 Discharge date: 02/25/2019  Admitted From: Home Disposition: Home  Recommendations for Outpatient Follow-up:  1. Follow up with PCP in 1-2 weeks 2. Please obtain BMP/CBC in one week your next doctors visit.  3. Started on aspirin, Effient, Zetia.  Also started on Crestor which she is tolerating the hospital, if becomes intolerant she will need to be on PCSK9 inhibitor 4. Follow-up outpatient cardiology 5. Iron supplements with bowel regimen  Home Health: None Equipment/Devices: None Discharge Condition: Stable CODE STATUS: Full Diet recommendation: Cardiac  Brief/Interim Summary: 59 year old with history of HTN, HLD, DM2, COPD, GERD, depression, rheumatoid arthritis, migraine, hypothyroidism, COVID-19 infection in September 2020, CKD stage III presented to the ER with chest pain.  Chest x-ray showed atelectasis, troponin of 80, EKG with T wave inversion in inferior leads.  Left heart catheterization 1/6 showed severe single-vessel coronary artery disease with successful PCI with drug-eluting stent to LCx.  With stratification and medical management per cardiology team.  Stop started on aspirin and Effient.  Trial of Crestor which he tolerated well, if necessary can be switched to PCSK9 inhibitor.  Echo showed normal EF.  She was doing well, stable for discharge.  Typical chest pain concerning for NSTEMI Coronary artery disease status post PCI/DES LCx -Status post left heart catheterization this morning status post PCI/drug-eluting stent to LCx -Aspirin and prasugrel -LDL 178, previous allergy to statin but for now willing to try Crestor.  Tolerating Crestor. If develops side effect of this, she will require PCSK9 inhibitor -A1c 6.5. -Echo-normal EF -Follow-up outpatient cardiology  Dizziness -Resolved with fluids  Anemia, iron  deficiency -Hemoglobin down to 9.9 from 11.    Some component of dilution.  No obvious bleeding -Low ferritin level levels, start iron supplements with bowel regimen  Hyperlipidemia -Elevated LDL 178, trial of Crestor, continue Zetia.  If develops side effects to statin, will need PCSK9 inhibitor.  Diabetes mellitus type 2 -Hemoglobin A1c 6.5.    Recommend diet control for now and follow-up with PCP  Essential hypertension -Home meds.   CKD stage III -Around baseline of 1-10.  Closely monitor.  GERD -PPI  Discharge Diagnoses:  Principal Problem:   Chest pain Active Problems:   Depression with anxiety   Hypercholesterolemia   COPD (chronic obstructive pulmonary disease) (HCC)   Hypertension   CKD (chronic kidney disease), stage IIIa   NSTEMI (non-ST elevated myocardial infarction) (HCC)   Hyperkalemia   Elevated troponin   GERD (gastroesophageal reflux disease)   Type II diabetes mellitus with renal manifestations [facility])    Consultations:  Cardiology, heart care  Subjective: Feels well, no complaints.  Discharge Exam: Vitals:   02/25/19 0510 02/25/19 0930  BP: 126/73 119/72  Pulse: 82 86  Resp: 18 18  Temp: 98.7 F (37.1 C) 98.5 F (36.9 C)  SpO2: 99% 97%   Vitals:   02/24/19 1614 02/24/19 2001 02/25/19 0510 02/25/19 0930  BP: 98/69 111/67 126/73 119/72  Pulse: 80 96 82 86  Resp: 18 20 18 18   Temp: 98.5 F (36.9 C) 98.2 F (36.8 C) 98.7 F (37.1 C) 98.5 F (36.9 C)  TempSrc:  Oral Oral Oral  SpO2: 95% 97% 99% 97%  Weight:   72.3 kg   Height:        General: Pt is alert, awake, not in acute distress Cardiovascular: RRR, S1/S2 +, no rubs, no gallops Respiratory: CTA bilaterally,  no wheezing, no rhonchi Abdominal: Soft, NT, ND, bowel sounds + Extremities: no edema, no cyanosis  Discharge Instructions  Discharge Instructions    AMB Referral to Cardiac Rehabilitation - Phase II   Complete by: As directed    Diagnosis:  Coronary  Stents NSTEMI     After initial evaluation and assessments completed: Virtual Based Care may be provided alone or in conjunction with Phase 2 Cardiac Rehab based on patient barriers.: Yes     Allergies as of 02/25/2019      Reactions   Almond Oil Hives   Atorvastatin    myalgias      Medication List    TAKE these medications   aspirin 81 MG EC tablet Take 1 tablet (81 mg total) by mouth daily. Start taking on: February 26, 2019   busPIRone 10 MG tablet Commonly known as: BUSPAR Take 10 mg by mouth 2 (two) times daily.   Butalbital-APAP-Caffeine 50-300-40 MG Caps Take by mouth 3 (three) times daily as needed for headache.   ezetimibe 10 MG tablet Commonly known as: ZETIA Take 1 tablet (10 mg total) by mouth daily. Start taking on: February 26, 2019   ferrous sulfate 325 (65 FE) MG tablet Take 1 tablet (325 mg total) by mouth 2 (two) times daily with a meal.   furosemide 40 MG tablet Commonly known as: LASIX Take 40 mg by mouth daily.   omeprazole 20 MG capsule Commonly known as: PRILOSEC Take 20 mg by mouth daily.   PARoxetine 40 MG tablet Commonly known as: PAXIL Take 40 mg by mouth daily.   potassium chloride SA 20 MEQ tablet Commonly known as: KLOR-CON Take 20 mEq by mouth 3 (three) times daily.   prasugrel 10 MG Tabs tablet Commonly known as: EFFIENT Take 1 tablet (10 mg total) by mouth daily. Start taking on: February 26, 2019   rosuvastatin 10 MG tablet Commonly known as: CRESTOR Take 1 tablet (10 mg total) by mouth daily at 6 PM.   senna-docusate 8.6-50 MG tablet Commonly known as: Senokot-S Take 2 tablets by mouth at bedtime as needed for mild constipation or moderate constipation.      Follow-up Information    [facility] Follow up.   Specialty: Cardiac Rehabilitation Why: Your Cardiologist has referred you to outpatient Cardiac Rehab at [facility].  The Cardiac Rehab department will contact you within one to two weeks after  discharge to schedule your first appointment.   Contact information: [address] [id number] [address] [place] [address] [phone]       Creig Hines, NP Follow up on 03/03/2019.   Specialties: Nurse Practitioner, Cardiology, Radiology Why: 8:00 AM Contact information: [address] [place] [address] [phone]        Evelene Croon, MD. Schedule an appointment as soon as possible for a visit in 2 week(s).   Specialty: Family Medicine Contact information: [address] [place] [address] [phone]        Yvonne Kendall, MD .   Specialty: Cardiology Contact information: [address] [place] [address] [phone]          Allergies  Allergen Reactions  . Almond Oil Hives  . Atorvastatin     myalgias    You were cared for by a hospitalist during your hospital stay. If you have any questions about your discharge medications or the care you received while you were in the hospital after you are discharged, you  can call the unit and asked to speak with the hospitalist on call if the hospitalist that took care of you is not available. Once you are discharged, your primary care physician will handle any further medical issues. Please note that no refills for any discharge medications will be authorized once you are discharged, as it is imperative that you return to your primary care physician (or establish a relationship with a primary care physician if you do not have one) for your aftercare needs so that they can reassess your need for medications and monitor your lab values.   Procedures/Studies: DG Chest 2 View  Result Date: 02/22/2019 CLINICAL DATA:  Chest pain EXAM: CHEST - 2 VIEW COMPARISON:  February 06, 2019 chest radiograph and chest CT; chest radiograph November 02, 2018 FINDINGS: There is apparent atelectatic change in each lung base and lingula. There is no evident edema or  consolidation. Heart is upper normal in size with pulmonary vascularity normal. No adenopathy. There is old trauma involving the proximal right humerus with remodeling. Postoperative changes noted in the lower cervical region. IMPRESSION: Areas of basilar and lingular atelectatic change, essentially stable. No evidence of edema or consolidation. Stable cardiac silhouette. No evident adenopathy. Electronically Signed   By: Bretta Bang III M.D.   On: 02/22/2019 13:12   DG Chest 2 View  Result Date: 02/06/2019 CLINICAL DATA:  Shortness of breath. EXAM: CHEST - 2 VIEW COMPARISON:  11/02/2018 FINDINGS: The heart size is stable from prior study. There are linear opacities at the lung bases and lingula favored to represent atelectasis with an infiltrate not excluded. There is no pneumothorax. No large pleural effusion. There is a stable healed old fracture deformity of the proximal right humerus. IMPRESSION: 1. Linear airspace opacities as above favored to represent atelectasis with an infiltrate not excluded. 2. Stable cardiac silhouette. 3. No pneumothorax. Electronically Signed   By: Katherine Mantle M.D.   On: 02/06/2019 03:44   CT Angio Chest PE W/Cm &/Or Wo Cm  Result Date: 02/06/2019 CLINICAL DATA:  Shortness of breath. EXAM: CT ANGIOGRAPHY CHEST WITH CONTRAST TECHNIQUE: Multidetector CT imaging of the chest was performed using the standard protocol during bolus administration of intravenous contrast. Multiplanar CT image reconstructions and MIPs were obtained to evaluate the vascular anatomy. CONTRAST:  17mL OMNIPAQUE IOHEXOL 350 MG/ML SOLN COMPARISON:  Radiograph earlier this day. Chest CTA 05/05/2018 FINDINGS: Cardiovascular: There are no filling defects within the pulmonary arteries to suggest pulmonary embolus. Thoracic aorta is normal in caliber. Upper normal heart size. No pericardial effusion. Mediastinum/Nodes: 10 mm right upper paratracheal node is unchanged from prior exam and likely  reactive. No hilar adenopathy. Mild distal esophageal wall thickening. No visualized thyroid nodule. Lungs/Pleura: No acute airspace disease. No pulmonary edema. No pleural fluid. Scattered linear atelectasis in both lungs. Pulmonary mass. The trachea and mainstem bronchi are patent. Upper Abdomen: Post cholecystectomy. Coarse calcification posterior to the right lobe of the liver is unchanged. Small hiatal hernia. No acute findings. Musculoskeletal: There are no acute or suspicious osseous abnormalities. Review of the MIP images confirms the above findings. IMPRESSION: 1. No pulmonary embolus. 2. Scattered linear atelectasis in both lungs. 3. Mild distal esophageal wall thickening with tiny hiatal hernia, can be seen with reflux or esophagitis. Electronically Signed   By: Narda Rutherford M.D.   On: 02/06/2019 04:23   CARDIAC CATHETERIZATION  Result Date: 02/23/2019 Conclusions: 1. Severe single-vessel coronary artery disease with 95% stenosis of the mid LCx.  See diagnostic catheterization report  by Dr. Mariah Milling for further details. 2. Successful PCI to the mid LCx using Resolute Onyx 2.75 x 15 mm drug-eluting stent (postdilated to 3.1 mm) with 0% residual stenosis and TIMI-3 flow. Recommendations: 1. Dual antiplatelet therapy with aspirin and prasugrel for at least 12 months. 2. Aggressive secondary prevention. Yvonne Kendall, MD [facility]   CARDIAC CATHETERIZATION  Result Date: 02/23/2019  Mid Cx lesion is 95% stenosed.  There is no aortic valve stenosis.    ECHOCARDIOGRAM COMPLETE  Result Date: 02/23/2019   ECHOCARDIOGRAM REPORT   Patient Name:   ARCHANA TERRACINA Date of Exam: 02/23/2019 Medical Rec #:  [id number]   Height:       64.0 in Accession #:    [id number]  Weight:       157.2 lb Date of Birth:  Sep 10, 1960   BSA:          1.77 m Patient Age:    58 years    BP:           101/63 mmHg Patient Gender: F           HR:           75 bpm. Exam Location:  [facility] Procedure: 2D Echo, Cardiac Doppler and Color  Doppler Indications:     Acute coronary syndrome 124.9  History:         Patient has prior history of Echocardiogram examinations, most                  recent 05/02/2018. COPD; Risk Factors:Hypertension and Diabetes.  Sonographer:     Cristela Blue RDCS (AE) Referring Phys:  [phone] CHRISTOPHER END Diagnosing Phys: Julien Nordmann MD IMPRESSIONS  1. Left ventricular ejection fraction, by visual estimation, is 60 to 65%. The left ventricle has normal function. There is no left ventricular hypertrophy.  2. The left ventricle has no regional wall motion abnormalities.  3. Left ventricular diastolic parameters are indeterminate.  4. Global right ventricle has normal systolic function.The right ventricular size is normal. No increase in right ventricular wall thickness.  5. Left atrial size was normal.  6. TR signal is inadequate for assessing pulmonary artery systolic pressure. FINDINGS  Left Ventricle: Left ventricular ejection fraction, by visual estimation, is 60 to 65%. The left ventricle has normal function. The left ventricle has no regional wall motion abnormalities. There is no left ventricular hypertrophy. Left ventricular diastolic parameters are indeterminate. Normal left atrial pressure. Right Ventricle: The right ventricular size is normal. No increase in right ventricular wall thickness. Global RV systolic function is has normal systolic function. The tricuspid regurgitant velocity is 1.45 m/s, and with an assumed right atrial pressure  of 10 mmHg, the estimated right ventricular systolic pressure is TR signal is inadequate for assessing PA pressure at 18.4 mmHg. Left Atrium: Left atrial size was normal in size. Right Atrium: Right atrial size was normal in size Pericardium: There is no evidence of pericardial effusion. Mitral Valve: The mitral valve is normal in structure. No evidence of mitral valve regurgitation. No evidence of mitral valve stenosis by observation. Tricuspid Valve: The tricuspid valve is  normal in structure. Tricuspid valve regurgitation is not demonstrated. Aortic Valve: The aortic valve is normal in structure. Aortic valve regurgitation is not visualized. The aortic valve is structurally normal, with no evidence of sclerosis or stenosis. Pulmonic Valve: The pulmonic valve was normal in structure. Pulmonic valve regurgitation is not visualized. Pulmonic regurgitation is not visualized. Aorta: The aortic root, ascending aorta and  aortic arch are all structurally normal, with no evidence of dilitation or obstruction. Venous: The inferior vena cava is normal in size with greater than 50% respiratory variability, suggesting right atrial pressure of 3 mmHg. IAS/Shunts: No atrial level shunt detected by color flow Doppler. There is no evidence of a patent foramen ovale. No ventricular septal defect is seen or detected. There is no evidence of an atrial septal defect.  LEFT VENTRICLE PLAX 2D LVIDd:         4.39 cm LVIDs:         2.72 cm LV PW:         1.15 cm LV IVS:        1.11 cm LVOT diam:     1.90 cm LV SV:         60 ml LV SV Index:   32.96 LVOT Area:     2.84 cm  LEFT ATRIUM         Index LA diam:    3.00 cm 1.70 cm/m                        PULMONIC VALVE AORTA                 PV Vmax:        0.73 m/s Ao Root diam: 3.00 cm PV Peak grad:   2.1 mmHg                       RVOT Peak grad: 3 mmHg  TRICUSPID VALVE TR Peak grad:   8.4 mmHg TR Vmax:        145.00 cm/s  SHUNTS Systemic Diam: 1.90 cm  Ida Rogue MD Electronically signed by Ida Rogue MD Signature Date/Time: 02/23/2019/6:27:20 PM    Final       The results of significant diagnostics from this hospitalization (including imaging, microbiology, ancillary and laboratory) are listed below for reference.     Microbiology: Recent Results (from the past 240 hour(s))  SARS CORONAVIRUS 2 (TAT 6-24 HRS) Nasopharyngeal Nasopharyngeal Swab     Status: None   Collection Time: 02/22/19  1:48 PM   Specimen: Nasopharyngeal Swab  Result  Value Ref Range Status   SARS Coronavirus 2 NEGATIVE NEGATIVE Final    Comment: (NOTE) SARS-CoV-2 target nucleic acids are NOT DETECTED. The SARS-CoV-2 RNA is generally detectable in upper and lower respiratory specimens during the acute phase of infection. Negative results do not preclude SARS-CoV-2 infection, do not rule out co-infections with other pathogens, and should not be used as the sole basis for treatment or other patient management decisions. Negative results must be combined with clinical observations, patient history, and epidemiological information. The expected result is Negative. Fact Sheet for Patients: SugarRoll.be Fact Sheet for Healthcare Providers: https://www.woods-mathews.com/ This test is not yet approved or cleared by the [place] FDA and  has been authorized for detection and/or diagnosis of SARS-CoV-2 by FDA under an Emergency Use Authorization (EUA). This EUA will remain  in effect (meaning this test can be used) for the duration of the COVID-19 declaration under Section 56 4(b)(1) of the Act, 21 U.S.C. section 360bbb-3(b)(1), unless the authorization is terminated or revoked sooner. Performed at [facility], [place] [address] [place], [place] [address]      Labs: BNP (last 3 results) Recent Labs    02/22/19 1904  BNP 08.1   Basic Metabolic Panel: Recent Labs  Lab 02/22/19 1250 02/23/19  0730 02/24/19 0546 02/25/19 0655  NA 138 135 138 139  K 5.4* 5.2* 4.9 3.8  CL 104 104 105 105  CO2 GLUCOSE 175* 132* 124* 126*  BUN 34* 31* 27* 17  CREATININE 1.38* 1.41* 1.28* 1.30*  CALCIUM 9.7 8.8* 8.8* 8.4*  MG  --   --  1.8 1.8   Liver Function Tests: Recent Labs  Lab 02/24/19 0546 02/25/19 0655  AST 21 17  ALT 24 21  ALKPHOS 76 69  BILITOT 0.7 0.6  PROT 6.3* 6.4*  ALBUMIN 3.2* 3.1*   No results for input(s): LIPASE, AMYLASE in the last 168 hours. No results for input(s):  AMMONIA in the last 168 hours. CBC: Recent Labs  Lab 02/22/19 1250 02/23/19 0730 02/24/19 0546 02/25/19 0655  WBC 14.0* 12.0* 10.4 8.8  HGB 13.9 11.0* 9.9* 9.6*  HCT 44.4 35.1* 31.9* 31.1*  MCV 91.0 93.9 93.3 92.0  PLT 409* 311 290 251   Cardiac Enzymes: No results for input(s): CKTOTAL, CKMB, CKMBINDEX, TROPONINI in the last 168 hours. BNP: Invalid input(s): POCBNP CBG: Recent Labs  Lab 02/24/19 0738 02/24/19 1244 02/24/19 1713 02/24/19 2124 02/25/19 0932  GLUCAP 115* 135* 98 161* 189*   D-Dimer No results for input(s): DDIMER in the last 72 hours. Hgb A1c Recent Labs    02/23/19 0730  HGBA1C 6.5*   Lipid Profile Recent Labs    02/23/19 0730  CHOL 288*  HDL 33*  LDLCALC 178*  TRIG 384*  CHOLHDL 8.7   Thyroid function studies No results for input(s): TSH, T4TOTAL, T3FREE, THYROIDAB in the last 72 hours.  Invalid input(s): FREET3 Anemia work up Recent Labs    02/24/19 0546  FERRITIN 20  TIBC 340  IRON 67   Urinalysis    Component Value Date/Time   COLORURINE YELLOW (A) 05/02/2018 0751   APPEARANCEUR CLEAR (A) 05/02/2018 0751   APPEARANCEUR Clear 12/03/2013 1556   LABSPEC 1.023 05/02/2018 0751   LABSPEC 1.024 12/03/2013 1556   PHURINE 5.0 05/02/2018 0751   GLUCOSEU NEGATIVE 05/02/2018 0751   GLUCOSEU Negative 12/03/2013 1556   HGBUR NEGATIVE 05/02/2018 0751   BILIRUBINUR NEGATIVE 05/02/2018 0751   BILIRUBINUR Negative 12/03/2013 1556   KETONESUR NEGATIVE 05/02/2018 0751   PROTEINUR 30 (A) 05/02/2018 0751   NITRITE POSITIVE (A) 05/02/2018 0751   LEUKOCYTESUR TRACE (A) 05/02/2018 0751   LEUKOCYTESUR Negative 12/03/2013 1556   Sepsis Labs Invalid input(s): PROCALCITONIN,  WBC,  LACTICIDVEN Microbiology Recent Results (from the past 240 hour(s))  SARS CORONAVIRUS 2 (TAT 6-24 HRS) Nasopharyngeal Nasopharyngeal Swab     Status: None   Collection Time: 02/22/19  1:48 PM   Specimen: Nasopharyngeal Swab  Result Value Ref Range Status   SARS  Coronavirus 2 NEGATIVE NEGATIVE Final    Comment: (NOTE) SARS-CoV-2 target nucleic acids are NOT DETECTED. The SARS-CoV-2 RNA is generally detectable in upper and lower respiratory specimens during the acute phase of infection. Negative results do not preclude SARS-CoV-2 infection, do not rule out co-infections with other pathogens, and should not be used as the sole basis for treatment or other patient management decisions. Negative results must be combined with clinical observations, patient history, and epidemiological information. The expected result is Negative. Fact Sheet for Patients: HairSlick.no Fact Sheet for Healthcare Providers: quierodirigir.com This test is not yet approved or cleared by the [place] FDA and  has been authorized for detection and/or diagnosis of SARS-CoV-2 by FDA under an Emergency Use Authorization (EUA). This EUA will  remain  in effect (meaning this test can be used) for the duration of the COVID-19 declaration under Section 56 4(b)(1) of the Act, 21 U.S.C. section 360bbb-3(b)(1), unless the authorization is terminated or revoked sooner. Performed at [facility], [address] [place], [place] [address]      Time coordinating discharge:  I have spent 35 minutes face to face with the patient and on the ward discussing the patients care, assessment, plan and disposition with other care givers. >50% of the time was devoted counseling the patient about the risks and benefits of treatment/Discharge disposition and coordinating care.   SIGNED:   Dimple Nanas, MD  [facility] 02/25/2019, 11:59 AM   If 7PM-7AM, please contact night-coverage

## 2019-02-25 NOTE — Progress Notes (Addendum)
Progress Note  Patient Name: Whitney Bernard Date of Encounter: 02/25/2019  Primary Cardiologist: [facility] - consult by End  Subjective   No chest pain or SOB. Nitropaste remains in place.   HGB low, though relatively stable at 9.6 this morning, down from an admission level of 13.9. Cath site with mild ecchymosis along with some bruising along the medial aspect of the right thigh. BP stable in the 120s systolic this morning.   Inpatient Medications    Scheduled Meds: . aspirin EC  81 mg Oral Daily  . busPIRone  10 mg Oral BID  . enoxaparin (LOVENOX) injection  40 mg Subcutaneous Q24H  . ezetimibe  10 mg Oral Daily  . influenza vac split quadrivalent PF  0.5 mL Intramuscular Tomorrow-1000  . insulin aspart  0-5 Units Subcutaneous QHS  . insulin aspart  0-9 Units Subcutaneous TID WC  . nicotine  14 mg Transdermal Daily  . nitroGLYCERIN  1 inch Topical Q6H  . pantoprazole  40 mg Oral Daily  . PARoxetine  40 mg Oral Daily  . prasugrel  10 mg Oral Daily  . rosuvastatin  10 mg Oral q1800  . sodium chloride flush  3 mL Intravenous Q12H   Continuous Infusions: . sodium chloride     PRN Meds: sodium chloride, acetaminophen, albuterol, butalbital-acetaminophen-caffeine, Melatonin, morphine injection, nitroGLYCERIN, ondansetron (ZOFRAN) IV, polyethylene glycol, senna-docusate, sodium chloride flush   Vital Signs    Vitals:   02/24/19 0736 02/24/19 1614 02/24/19 2001 02/25/19 0510  BP: 106/64 98/69 111/67 126/73  Pulse: 69 80 96 82  Resp:  18 20 18   Temp: 98.5 F (36.9 C) 98.5 F (36.9 C) 98.2 F (36.8 C) 98.7 F (37.1 C)  TempSrc: Oral  Oral Oral  SpO2: 98% 95% 97% 99%  Weight:    72.3 kg  Height:        Intake/Output Summary (Last 24 hours) at 02/25/2019 0810 Last data filed at 02/25/2019 0513 Gross per 24 hour  Intake 792.71 ml  Output 1100 ml  Net -307.29 ml   Filed Weights   02/23/19 0037 02/24/19 0457 02/25/19 0510  Weight: 71.3 kg 72.1 kg 72.3 kg    Telemetry    SR - Personally Reviewed  ECG    No new tracings - Personally Reviewed  Physical Exam   GEN: No acute distress.   Neck: No JVD. Cardiac: RRR, no murmurs, rubs, or gallops. Right femoral cardiac cath site has some mild ecchymosis. There is some bruising along the medial aspect of the right thigh. No active bleeding, swelling, warmth, erythema, or TTP. No bruit.  Respiratory: Clear to auscultation bilaterally.  GI: Soft, nontender, non-distended.   MS: No edema; No deformity. Neuro:  Alert and oriented x 3; Nonfocal.  Psych: Normal affect.  Labs    Chemistry Recent Labs  Lab 02/22/19 1250 02/23/19 0730 02/24/19 0546  NA 138 135 138  K 5.4* 5.2* 4.9  CL 104 104 105  CO2 22 25 25   GLUCOSE 175* 132* 124*  BUN 34* 31* 27*  CREATININE 1.38* 1.41* 1.28*  CALCIUM 9.7 8.8* 8.8*  PROT  --   --  6.3*  ALBUMIN  --   --  3.2*  AST  --   --  21  ALT  --   --  24  ALKPHOS  --   --  76  BILITOT  --   --  0.7  GFRNONAA 41* 40* 45*  GFRAA 48* 47* 53*  ANIONGAP 12  6 8     Hematology Recent Labs  Lab 02/22/19 1250 02/23/19 0730 02/24/19 0546  WBC 14.0* 12.0* 10.4  RBC 4.88 3.74* 3.42*  HGB 13.9 11.0* 9.9*  HCT 44.4 35.1* 31.9*  MCV 91.0 93.9 93.3  MCH 28.5 29.4 28.9  MCHC 31.3 31.3 31.0  RDW 13.9 14.3 14.0  PLT 409* 311 290    Cardiac EnzymesNo results for input(s): TROPONINI in the last 168 hours. No results for input(s): TROPIPOC in the last 168 hours.   BNP Recent Labs  Lab 02/22/19 1904  BNP 83.0     DDimer No results for input(s): DDIMER in the last 168 hours.   Radiology    CXR 02/22/2019: IMPRESSION: Areas of basilar and lingular atelectatic change, essentially stable. No evidence of edema or consolidation. Stable cardiac silhouette. No evident adenopathy.  Cardiac Studies   2D Echo 02/23/2019: 1. Left ventricular ejection fraction, by visual estimation, is 60 to 65%. The left ventricle has normal function. There is no left ventricular hypertrophy.   2. The left ventricle has no regional wall motion abnormalities.  3. Left ventricular diastolic parameters are indeterminate.  4. Global right ventricle has normal systolic function.The right ventricular size is normal. No increase in right ventricular wall thickness.  5. Left atrial size was normal.  6. TR signal is inadequate for assessing pulmonary artery systolic pressure. __________  LHC 02/23/2019: Coronary dominance: Right  Left mainstem:   Large vessel that bifurcates into the LAD and left circumflex, no significant disease noted  Left anterior descending (LAD):   Large vessel that extends to the apical region, diagonal branch 2 of moderate size, no significant disease noted  Left circumflex (LCx):  Large vessel with distal OM branch , 95% proximal to mid LCX disease (likely culprit vessel)  Right coronary artery (RCA):  Right dominant vessel with PL and PDA, no significant disease noted  Left ventriculography:  no significant aortic valve stenosis  Final Conclusions:   Severe single vessel disease, of the LCX Otherwise mild lumenal irregularities  Recommendations:  Discussed with interventional cardiology, Dr. Saunders Revel, Plan will be for PCI of the LCX __________  PCI 02/23/2019: Conclusions: 1. Severe single-vessel coronary artery disease with 95% stenosis of the mid LCx.  See diagnostic catheterization report by Dr. Rockey Situ for further details. 2. Successful PCI to the mid LCx using Resolute Onyx 2.75 x 15 mm drug-eluting stent (postdilated to 3.1 mm) with 0% residual stenosis and TIMI-3 flow.  Recommendations: 1. Dual antiplatelet therapy with aspirin and prasugrel for at least 12 months. 2. Aggressive secondary prevention.  Patient Profile     59 y.o. female with history of diabetes, hypertension, hyperlipidemia, hypothyroidism, chronic pain disorder with narcotic abuse, CKD stage II-III, COVID-19 infection diagnosed in 10/2018, rheumatoid arthritis, migraine disorder, COPD  secondary topriortobacco abuse, colitis, diverticulitis and depressionwho  who was admitted with chest pain and NSTEMI.   Assessment & Plan    1. NSTEMI/CAD: -No angina  -Continue DAPT with ASA and Effient without interruption for the next 12 months -Beta blocker held for hypotension  -Statin intolerant as above -Continue Zetia -Will need PCSK-9 inhibitor  -Stop nitropaste  -Cardiac rehab  -Post cath instructions   2. Anemia: -HGB remains low, though is relatively stable -There is some ecchymosis along the right medial thigh, no bruit -Will discuss imaging with MD -Will need short term CBC in the office -Occult blood pending  3. Lightheadedness/hypotension: -Improved -Stop nitropaste   4. CKD stage II-III: -Renal function stable  5. HTN: -BP has been soft this admission -Currently stable in the 120s systolic  -Not currently on antihypertensive therapy   6. HLD: -LDL of 178 with statin intolerance, with goal being < 70 -On Zetia -Will need PCSK-9 inhibitor as an outpatient   7. Tobacco use: -Cessation is advised  8. Chronic pain/anxiety: -Per IM   For questions or updates, please contact [facility] Please consult [email or web address] for contact info under Cardiology/STEMI.    Signed, Eula Listen, PA-C [facility] Pager: [phone] 02/25/2019, 8:10 AM

## 2019-02-25 NOTE — Progress Notes (Signed)
Whitney Bernard to be D/C'd Home per MD order.  Discussed prescriptions and follow up appointments with the patient. Prescriptions were sent to patients pharmacy, medication list explained in detail. Pt verbalized understanding.  Allergies as of 02/25/2019      Reactions   Almond Oil Hives   Atorvastatin    myalgias      Medication List    TAKE these medications   aspirin 81 MG EC tablet Take 1 tablet (81 mg total) by mouth daily. Start taking on: February 26, 2019   busPIRone 10 MG tablet Commonly known as: BUSPAR Take 10 mg by mouth 2 (two) times daily.   Butalbital-APAP-Caffeine 50-300-40 MG Caps Take by mouth 3 (three) times daily as needed for headache.   ezetimibe 10 MG tablet Commonly known as: ZETIA Take 1 tablet (10 mg total) by mouth daily. Start taking on: February 26, 2019   ferrous sulfate 325 (65 FE) MG tablet Take 1 tablet (325 mg total) by mouth 2 (two) times daily with a meal.   furosemide 40 MG tablet Commonly known as: LASIX Take 40 mg by mouth daily.   omeprazole 20 MG capsule Commonly known as: PRILOSEC Take 20 mg by mouth daily.   PARoxetine 40 MG tablet Commonly known as: PAXIL Take 40 mg by mouth daily.   potassium chloride SA 20 MEQ tablet Commonly known as: KLOR-CON Take 20 mEq by mouth 3 (three) times daily.   prasugrel 10 MG Tabs tablet Commonly known as: EFFIENT Take 1 tablet (10 mg total) by mouth daily. Start taking on: February 26, 2019   rosuvastatin 10 MG tablet Commonly known as: CRESTOR Take 1 tablet (10 mg total) by mouth daily at 6 PM.   senna-docusate 8.6-50 MG tablet Commonly known as: Senokot-S Take 2 tablets by mouth at bedtime as needed for mild constipation or moderate constipation.       Vitals:   02/25/19 0510 02/25/19 0930  BP: 126/73 119/72  Pulse: 82 86  Resp: 18 18  Temp: 98.7 F (37.1 C) 98.5 F (36.9 C)  SpO2: 99% 97%    Tele box removed and returned. Skin clean, dry and intact without evidence of skin  break down, no evidence of skin tears noted. IV catheter discontinued intact. Site without signs and symptoms of complications. Dressing and pressure applied. Pt denies pain at this time. No complaints noted.  An After Visit Summary was printed and given to the patient. Patient escorted via WC, and D/C home via private auto.  Rigoberto Noel

## 2019-02-28 ENCOUNTER — Other Ambulatory Visit: Payer: Self-pay | Admitting: *Deleted

## 2019-02-28 NOTE — Patient Outreach (Signed)
[facility]) Care Management  02/28/2019  TAYLLOR BREITENSTEIN 1960/04/29 [id number]   Transition of care case closure    Patient will be followed by [facility] interactive voice response call program  to assess for discharge needs.   Plan Will close to [facility] care management at this time.    Egbert Garibaldi, RN, PCCN Alumnus  [facility] Care Management,Care Management Coordinator  [phone]- Mobile [phone]- Toll Free Main Office

## 2019-03-02 ENCOUNTER — Encounter: Payer: Self-pay | Admitting: Nurse Practitioner

## 2019-03-02 NOTE — Progress Notes (Deleted)
Office Visit    Patient Name: Whitney Bernard Date of Encounter: 03/02/2019  Primary Care Provider:  Lorelee Market, MD Primary Cardiologist:  Nelva Bush, MD  Chief Complaint    ***  Past Medical History    Past Medical History:  Diagnosis Date  . Acute pancreatitis   . CAD (coronary artery disease)    a. 08/2017 Neg MV; b. 01/2019 CTA Chest: Mild cor Ca2+; c. 02/2019 NSTEMI/PCI: LM nl, LAD nl, LCX 95p/m (2.75x15 Resolute Onyx DES), RCA nl.  . Chronic pain syndrome   . CKD (chronic kidney disease), stage III   . COPD (chronic obstructive pulmonary disease) ([place])   . Depression   . Diabetes mellitus without complication ([place])   . Headache(784.0)    migraines  . History of echocardiogram    a. 04/2018 Echo: EF 55-60%. No significant valvular dzs; b. 02/2019 Echo: EF 60-65%. Nl RV fxn.  . Hypercholesteremia   . Hypertension   . Hyperthyroidism   . Hypokalemia   . Migraine   . Narcotic abuse ([place])   . RA (rheumatoid arthritis) ([place])    Past Surgical History:  Procedure Laterality Date  . ANTERIOR CERVICAL DECOMP/DISCECTOMY FUSION N/A 11/26/2012   Procedure: ANTERIOR CERVICAL DECOMPRESSION/DISCECTOMY FUSION 2 LEVELS;  Surgeon: Faythe Ghee, MD;  Location: [place] NEURO ORS;  Service: Neurosurgery;  Laterality: N/A;  ANTERIOR CERVICAL DECOMPRESSION/DISCECTOMY FUSION 2 LEVELS  . CERVICAL POLYPECTOMY    . CHOLECYSTECTOMY    . CORONARY STENT INTERVENTION N/A 02/23/2019   Procedure: CORONARY STENT INTERVENTION;  Surgeon: Nelva Bush, MD;  Location: [place] CV LAB;  Service: Cardiovascular;  Laterality: N/A;  . ESOPHAGOGASTRODUODENOSCOPY N/A 12/26/2014   Procedure: ESOPHAGOGASTRODUODENOSCOPY (EGD);  Surgeon: Lucilla Lame, MD;  Location: [facility] ENDOSCOPY;  Service: Endoscopy;  Laterality: N/A;  . FRACTURE SURGERY    . LEFT HEART CATH AND CORONARY ANGIOGRAPHY N/A 02/23/2019   Procedure: LEFT HEART CATH AND CORONARY ANGIOGRAPHY;  Surgeon: Minna Merritts, MD;  Location:  [place] CV LAB;  Service: Cardiovascular;  Laterality: N/A;  . NECK SURGERY    . TUBAL LIGATION    . WISDOM TOOTH EXTRACTION      Allergies  Allergies  Allergen Reactions  . Almond Oil Hives  . Atorvastatin     myalgias    History of Present Illness    ***  Home Medications    Prior to Admission medications   Medication Sig Start Date End Date Taking? Authorizing Provider  aspirin EC 81 MG EC tablet Take 1 tablet (81 mg total) by mouth daily. 02/26/19 04/27/19  Amin, Jeanella Flattery, MD  busPIRone (BUSPAR) 10 MG tablet Take 10 mg by mouth 2 (two) times daily.  04/26/18   [provider]  Butalbital-APAP-Caffeine 50-300-40 MG CAPS Take by mouth 3 (three) times daily as needed for headache.  04/26/18   [provider]  ezetimibe (ZETIA) 10 MG tablet Take 1 tablet (10 mg total) by mouth daily. 02/26/19   Amin, Jeanella Flattery, MD  ferrous sulfate 325 (65 FE) MG tablet Take 1 tablet (325 mg total) by mouth 2 (two) times daily with a meal. 02/25/19 04/26/19  Amin, Jeanella Flattery, MD  furosemide (LASIX) 40 MG tablet Take 40 mg by mouth daily.    [provider]  omeprazole (PRILOSEC) 20 MG capsule Take 20 mg by mouth daily.    [provider]  PARoxetine (PAXIL) 40 MG tablet Take 40 mg by mouth daily.    [provider]  potassium  chloride SA (K-DUR,KLOR-CON) 20 MEQ tablet Take 20 mEq by mouth 3 (three) times daily.     [provider]  prasugrel (EFFIENT) 10 MG TABS tablet Take 1 tablet (10 mg total) by mouth daily. 02/26/19   Amin, Loura Halt, MD  rosuvastatin (CRESTOR) 10 MG tablet Take 1 tablet (10 mg total) by mouth daily at 6 PM. 02/25/19   Amin, Loura Halt, MD  senna-docusate (SENOKOT-S) 8.6-50 MG tablet Take 2 tablets by mouth at bedtime as needed for mild constipation or moderate constipation. 02/25/19 04/26/19  Dimple Nanas, MD    Review of Systems    ***.  All other systems reviewed and are otherwise negative except as noted  above.  Physical Exam    VS:  There were no vitals taken for this visit. , BMI There is no height or weight on file to calculate BMI. GEN: Well nourished, well developed, in no acute distress. HEENT: normal. Neck: Supple, no JVD, carotid bruits, or masses. Cardiac: RRR, no murmurs, rubs, or gallops. No clubbing, cyanosis, edema.  Radials/DP/PT 2+ and equal bilaterally.  Respiratory:  Respirations regular and unlabored, clear to auscultation bilaterally. GI: Soft, nontender, nondistended, BS + x 4. MS: no deformity or atrophy. Skin: warm and dry, no rash. Neuro:  Strength and sensation are intact. Psych: Normal affect.  Accessory Clinical Findings    ECG personally reviewed by me today - *** - no acute changes.  Lab Results  Component Value Date   WBC 8.8 02/25/2019   HGB 9.6 (L) 02/25/2019   HCT 31.1 (L) 02/25/2019   MCV 92.0 02/25/2019   PLT 251 02/25/2019   Lab Results  Component Value Date   CREATININE 1.30 (H) 02/25/2019   BUN 17 02/25/2019   NA 139 02/25/2019   K 3.8 02/25/2019   CL 105 02/25/2019   CO2 25 02/25/2019   Lab Results  Component Value Date   ALT 21 02/25/2019   AST 17 02/25/2019   ALKPHOS 69 02/25/2019   BILITOT 0.6 02/25/2019   Lab Results  Component Value Date   CHOL 288 (H) 02/23/2019   HDL 33 (L) 02/23/2019   LDLCALC 178 (H) 02/23/2019   TRIG 384 (H) 02/23/2019   CHOLHDL 8.7 02/23/2019    Lab Results  Component Value Date   HGBA1C 6.5 (H) 02/23/2019    Assessment & Plan    1.  ***   Nicolasa Ducking, NP 03/02/2019, 5:13 PM

## 2019-03-03 ENCOUNTER — Ambulatory Visit: Payer: 59 | Admitting: Nurse Practitioner

## 2019-03-04 ENCOUNTER — Encounter: Payer: Self-pay | Admitting: Nurse Practitioner

## 2019-04-27 DIAGNOSIS — F419 Anxiety disorder, unspecified: Secondary | ICD-10-CM | POA: Diagnosis not present

## 2019-04-27 DIAGNOSIS — E119 Type 2 diabetes mellitus without complications: Secondary | ICD-10-CM | POA: Diagnosis not present

## 2019-04-27 DIAGNOSIS — M542 Cervicalgia: Secondary | ICD-10-CM | POA: Diagnosis not present

## 2019-04-27 DIAGNOSIS — E7800 Pure hypercholesterolemia, unspecified: Secondary | ICD-10-CM | POA: Diagnosis not present

## 2019-04-27 DIAGNOSIS — F329 Major depressive disorder, single episode, unspecified: Secondary | ICD-10-CM | POA: Diagnosis not present

## 2019-06-03 ENCOUNTER — Telehealth: Payer: Self-pay

## 2019-06-03 NOTE — Telephone Encounter (Signed)
l mom to r/s no show appt

## 2019-06-06 ENCOUNTER — Encounter: Payer: Self-pay | Admitting: Emergency Medicine

## 2019-06-06 ENCOUNTER — Other Ambulatory Visit: Payer: Self-pay

## 2019-06-06 ENCOUNTER — Encounter
Admission: EM | Disposition: A | Discharge status: Home / Self Care | Payer: Self-pay | Source: Home / Self Care | Attending: Emergency Medicine

## 2019-06-06 ENCOUNTER — Observation Stay
Admission: EM | Admit: 2019-06-06 | Discharge: 2019-06-07 | Disposition: A | Discharge status: Home / Self Care | Payer: 59 | Attending: Internal Medicine | Admitting: Internal Medicine

## 2019-06-06 ENCOUNTER — Emergency Department: Payer: 59

## 2019-06-06 DIAGNOSIS — I252 Old myocardial infarction: Secondary | ICD-10-CM | POA: Insufficient documentation

## 2019-06-06 DIAGNOSIS — N1831 Chronic kidney disease, stage 3a: Secondary | ICD-10-CM | POA: Diagnosis not present

## 2019-06-06 DIAGNOSIS — I25110 Atherosclerotic heart disease of native coronary artery with unstable angina pectoris: Principal | ICD-10-CM | POA: Insufficient documentation

## 2019-06-06 DIAGNOSIS — Z8616 Personal history of COVID-19: Secondary | ICD-10-CM | POA: Insufficient documentation

## 2019-06-06 DIAGNOSIS — E1122 Type 2 diabetes mellitus with diabetic chronic kidney disease: Secondary | ICD-10-CM | POA: Insufficient documentation

## 2019-06-06 DIAGNOSIS — E7800 Pure hypercholesterolemia, unspecified: Secondary | ICD-10-CM | POA: Diagnosis not present

## 2019-06-06 DIAGNOSIS — I209 Angina pectoris, unspecified: Secondary | ICD-10-CM

## 2019-06-06 DIAGNOSIS — R079 Chest pain, unspecified: Secondary | ICD-10-CM | POA: Diagnosis not present

## 2019-06-06 DIAGNOSIS — Z79899 Other long term (current) drug therapy: Secondary | ICD-10-CM | POA: Insufficient documentation

## 2019-06-06 DIAGNOSIS — I129 Hypertensive chronic kidney disease with stage 1 through stage 4 chronic kidney disease, or unspecified chronic kidney disease: Secondary | ICD-10-CM | POA: Insufficient documentation

## 2019-06-06 DIAGNOSIS — F329 Major depressive disorder, single episode, unspecified: Secondary | ICD-10-CM | POA: Diagnosis not present

## 2019-06-06 DIAGNOSIS — E039 Hypothyroidism, unspecified: Secondary | ICD-10-CM | POA: Diagnosis not present

## 2019-06-06 DIAGNOSIS — M069 Rheumatoid arthritis, unspecified: Secondary | ICD-10-CM | POA: Diagnosis not present

## 2019-06-06 DIAGNOSIS — Z87891 Personal history of nicotine dependence: Secondary | ICD-10-CM | POA: Diagnosis not present

## 2019-06-06 DIAGNOSIS — I25118 Atherosclerotic heart disease of native coronary artery with other forms of angina pectoris: Secondary | ICD-10-CM

## 2019-06-06 DIAGNOSIS — D649 Anemia, unspecified: Secondary | ICD-10-CM | POA: Insufficient documentation

## 2019-06-06 DIAGNOSIS — I200 Unstable angina: Secondary | ICD-10-CM

## 2019-06-06 DIAGNOSIS — Z955 Presence of coronary angioplasty implant and graft: Secondary | ICD-10-CM | POA: Insufficient documentation

## 2019-06-06 DIAGNOSIS — E785 Hyperlipidemia, unspecified: Secondary | ICD-10-CM | POA: Insufficient documentation

## 2019-06-06 DIAGNOSIS — E1129 Type 2 diabetes mellitus with other diabetic kidney complication: Secondary | ICD-10-CM | POA: Diagnosis present

## 2019-06-06 DIAGNOSIS — J449 Chronic obstructive pulmonary disease, unspecified: Secondary | ICD-10-CM | POA: Diagnosis not present

## 2019-06-06 DIAGNOSIS — F419 Anxiety disorder, unspecified: Secondary | ICD-10-CM | POA: Insufficient documentation

## 2019-06-06 DIAGNOSIS — N183 Chronic kidney disease, stage 3 unspecified: Secondary | ICD-10-CM | POA: Diagnosis not present

## 2019-06-06 DIAGNOSIS — Z20822 Contact with and (suspected) exposure to covid-19: Secondary | ICD-10-CM | POA: Diagnosis not present

## 2019-06-06 DIAGNOSIS — G894 Chronic pain syndrome: Secondary | ICD-10-CM | POA: Diagnosis not present

## 2019-06-06 DIAGNOSIS — I2510 Atherosclerotic heart disease of native coronary artery without angina pectoris: Secondary | ICD-10-CM | POA: Diagnosis present

## 2019-06-06 DIAGNOSIS — F32A Depression, unspecified: Secondary | ICD-10-CM | POA: Diagnosis present

## 2019-06-06 HISTORY — PX: LEFT HEART CATH AND CORONARY ANGIOGRAPHY: CATH118249

## 2019-06-06 LAB — CBC WITH DIFFERENTIAL/PLATELET
Abs Immature Granulocytes: 0.07 10*3/uL (ref 0.00–0.07)
Basophils Absolute: 0.1 10*3/uL (ref 0.0–0.1)
Basophils Relative: 1 %
Eosinophils Absolute: 0.3 10*3/uL (ref 0.0–0.5)
Eosinophils Relative: 2 %
HCT: 36.9 % (ref 36.0–46.0)
Hemoglobin: 12.5 g/dL (ref 12.0–15.0)
Immature Granulocytes: 1 %
Lymphocytes Relative: 21 %
Lymphs Abs: 3.3 10*3/uL (ref 0.7–4.0)
MCH: 30.6 pg (ref 26.0–34.0)
MCHC: 33.9 g/dL (ref 30.0–36.0)
MCV: 90.2 fL (ref 80.0–100.0)
Monocytes Absolute: 1.1 10*3/uL — ABNORMAL HIGH (ref 0.1–1.0)
Monocytes Relative: 7 %
Neutro Abs: 10.6 10*3/uL — ABNORMAL HIGH (ref 1.7–7.7)
Neutrophils Relative %: 68 %
Platelets: 462 10*3/uL — ABNORMAL HIGH (ref 150–400)
RBC: 4.09 MIL/uL (ref 3.87–5.11)
RDW: 13.9 % (ref 11.5–15.5)
WBC: 15.5 10*3/uL — ABNORMAL HIGH (ref 4.0–10.5)
nRBC: 0 % (ref 0.0–0.2)

## 2019-06-06 LAB — BASIC METABOLIC PANEL
Anion gap: 8 (ref 5–15)
BUN: 26 mg/dL — ABNORMAL HIGH (ref 8–23)
CO2: 21 mmol/L — ABNORMAL LOW (ref 22–32)
Calcium: 9 mg/dL (ref 8.9–10.3)
Chloride: 109 mmol/L (ref 98–111)
Creatinine, Ser: 1.38 mg/dL — ABNORMAL HIGH (ref 0.44–1.00)
GFR calc Af Amer: 48 mL/min — ABNORMAL LOW (ref >60)
GFR calc non Af Amer: 41 mL/min — ABNORMAL LOW (ref >60)
Glucose, Bld: 163 mg/dL — ABNORMAL HIGH (ref 70–99)
Potassium: 3.9 mmol/L (ref 3.5–5.1)
Sodium: 138 mmol/L (ref 135–145)

## 2019-06-06 LAB — HEPATIC FUNCTION PANEL
ALT: 16 U/L (ref 0–44)
AST: 14 U/L — ABNORMAL LOW (ref 15–41)
Albumin: 3.7 g/dL (ref 3.5–5.0)
Alkaline Phosphatase: 106 U/L (ref 38–126)
Bilirubin, Direct: <0.1 mg/dL (ref 0.0–0.2)
Total Bilirubin: 0.6 mg/dL (ref 0.3–1.2)
Total Protein: 7.4 g/dL (ref 6.5–8.1)

## 2019-06-06 LAB — HEMOGLOBIN A1C
Hgb A1c MFr Bld: 6.3 % — ABNORMAL HIGH (ref 4.8–5.6)
Mean Plasma Glucose: 134.11 mg/dL

## 2019-06-06 LAB — RESPIRATORY PANEL BY RT PCR (FLU A&B, COVID)
Influenza A by PCR: NEGATIVE
Influenza B by PCR: NEGATIVE
SARS Coronavirus 2 by RT PCR: NEGATIVE

## 2019-06-06 LAB — TROPONIN I (HIGH SENSITIVITY)
Troponin I (High Sensitivity): 4 ng/L (ref <18)
Troponin I (High Sensitivity): 4 ng/L (ref <18)

## 2019-06-06 LAB — GLUCOSE, CAPILLARY
Glucose-Capillary: 119 mg/dL — ABNORMAL HIGH (ref 70–99)
Glucose-Capillary: 121 mg/dL — ABNORMAL HIGH (ref 70–99)
Glucose-Capillary: 152 mg/dL — ABNORMAL HIGH (ref 70–99)

## 2019-06-06 LAB — APTT: aPTT: 28 seconds (ref 24–36)

## 2019-06-06 LAB — PROTIME-INR
INR: 1 (ref 0.8–1.2)
Prothrombin Time: 13 seconds (ref 11.4–15.2)

## 2019-06-06 LAB — LIPASE, BLOOD: Lipase: 39 U/L (ref 11–51)

## 2019-06-06 SURGERY — LEFT HEART CATH AND CORONARY ANGIOGRAPHY
Anesthesia: Moderate Sedation

## 2019-06-06 MED ORDER — BUTALBITAL-APAP-CAFFEINE 50-325-40 MG PO TABS
1.0000 | ORAL_TABLET | Freq: Three times a day (TID) | ORAL | Status: DC | PRN
  Administered 2019-06-06 – 2019-06-07 (×2): 1 via ORAL
  Filled 2019-06-06 (×2): qty 1

## 2019-06-06 MED ORDER — HEPARIN SODIUM (PORCINE) 1000 UNIT/ML IJ SOLN
INTRAMUSCULAR | Status: DC | PRN
  Administered 2019-06-06: 3500 [IU] via INTRAVENOUS

## 2019-06-06 MED ORDER — SODIUM CHLORIDE 0.9% FLUSH: 3.0000 mL | INTRAVENOUS | Status: DC | PRN

## 2019-06-06 MED ORDER — MIDAZOLAM HCL 2 MG/2ML IJ SOLN
INTRAMUSCULAR | Status: AC
  Filled 2019-06-06: qty 2

## 2019-06-06 MED ORDER — ASPIRIN 81 MG PO CHEW
324.0000 mg | CHEWABLE_TABLET | ORAL | Status: AC
  Administered 2019-06-06: 09:00:00 324 mg via ORAL
  Filled 2019-06-06: qty 4

## 2019-06-06 MED ORDER — PAROXETINE HCL 20 MG PO TABS
40.0000 mg | ORAL_TABLET | Freq: Every day | ORAL | Status: DC
  Administered 2019-06-07: 40 mg via ORAL
  Filled 2019-06-06 (×2): qty 2

## 2019-06-06 MED ORDER — SODIUM CHLORIDE 0.9% FLUSH
3.0000 mL | Freq: Two times a day (BID) | INTRAVENOUS | Status: DC
  Administered 2019-06-06: 23:00:00 3 mL via INTRAVENOUS

## 2019-06-06 MED ORDER — FERROUS SULFATE 325 (65 FE) MG PO TABS
325.0000 mg | ORAL_TABLET | Freq: Two times a day (BID) | ORAL | Status: DC
  Administered 2019-06-07: 325 mg via ORAL
  Filled 2019-06-06 (×4): qty 1

## 2019-06-06 MED ORDER — INSULIN ASPART 100 UNIT/ML ~~LOC~~ SOLN: 0.0000 [IU] | SUBCUTANEOUS | Status: DC

## 2019-06-06 MED ORDER — NITROGLYCERIN 0.4 MG SL SUBL
SUBLINGUAL_TABLET | SUBLINGUAL | Status: AC
  Administered 2019-06-06: 0.4 mg via SUBLINGUAL
  Filled 2019-06-06: qty 1

## 2019-06-06 MED ORDER — FUROSEMIDE 40 MG PO TABS
40.0000 mg | ORAL_TABLET | Freq: Two times a day (BID) | ORAL | Status: DC
  Filled 2019-06-06: qty 1

## 2019-06-06 MED ORDER — INSULIN ASPART 100 UNIT/ML ~~LOC~~ SOLN
0.0000 [IU] | Freq: Three times a day (TID) | SUBCUTANEOUS | Status: DC
  Administered 2019-06-07: 10:00:00 1 [IU] via SUBCUTANEOUS
  Filled 2019-06-06: qty 1

## 2019-06-06 MED ORDER — BUSPIRONE HCL 10 MG PO TABS
10.0000 mg | ORAL_TABLET | Freq: Two times a day (BID) | ORAL | Status: DC
  Administered 2019-06-06 – 2019-06-07 (×2): 10 mg via ORAL
  Filled 2019-06-06 (×4): qty 1

## 2019-06-06 MED ORDER — LIDOCAINE VISCOUS HCL 2 % MT SOLN
15.0000 mL | Freq: Once | OROMUCOSAL | Status: AC
  Administered 2019-06-06: 06:00:00 15 mL via ORAL
  Filled 2019-06-06: qty 15

## 2019-06-06 MED ORDER — HEPARIN SODIUM (PORCINE) 1000 UNIT/ML IJ SOLN
INTRAMUSCULAR | Status: AC
  Filled 2019-06-06: qty 1

## 2019-06-06 MED ORDER — HEPARIN BOLUS VIA INFUSION
4000.0000 [IU] | Freq: Once | INTRAVENOUS | Status: AC
  Administered 2019-06-06: 4000 [IU] via INTRAVENOUS
  Filled 2019-06-06: qty 4000

## 2019-06-06 MED ORDER — EZETIMIBE 10 MG PO TABS
10.0000 mg | ORAL_TABLET | Freq: Every day | ORAL | Status: DC
  Administered 2019-06-07: 10 mg via ORAL
  Filled 2019-06-06 (×2): qty 1

## 2019-06-06 MED ORDER — IOHEXOL 300 MG/ML  SOLN
INTRAMUSCULAR | Status: DC | PRN
  Administered 2019-06-06: 30 mL

## 2019-06-06 MED ORDER — HEPARIN (PORCINE) IN NACL 1000-0.9 UT/500ML-% IV SOLN
INTRAVENOUS | Status: DC | PRN
  Administered 2019-06-06: 500 mL

## 2019-06-06 MED ORDER — VERAPAMIL HCL 2.5 MG/ML IV SOLN
INTRAVENOUS | Status: AC
  Filled 2019-06-06: qty 2

## 2019-06-06 MED ORDER — CARISOPRODOL 350 MG PO TABS
350.0000 mg | ORAL_TABLET | Freq: Two times a day (BID) | ORAL | Status: DC
  Administered 2019-06-06 – 2019-06-07 (×2): 350 mg via ORAL
  Filled 2019-06-06 (×2): qty 1

## 2019-06-06 MED ORDER — NITROGLYCERIN 0.4 MG SL SUBL
0.4000 mg | SUBLINGUAL_TABLET | SUBLINGUAL | Status: DC | PRN
  Administered 2019-06-06: 0.4 mg via SUBLINGUAL

## 2019-06-06 MED ORDER — ASPIRIN EC 81 MG PO TBEC
81.0000 mg | DELAYED_RELEASE_TABLET | Freq: Every day | ORAL | Status: DC
  Administered 2019-06-07: 10:00:00 81 mg via ORAL
  Filled 2019-06-06: qty 1

## 2019-06-06 MED ORDER — ALUM & MAG HYDROXIDE-SIMETH 200-200-20 MG/5ML PO SUSP
30.0000 mL | Freq: Once | ORAL | Status: AC
  Administered 2019-06-06: 30 mL via ORAL
  Filled 2019-06-06: qty 30

## 2019-06-06 MED ORDER — FENTANYL CITRATE (PF) 100 MCG/2ML IJ SOLN
INTRAMUSCULAR | Status: DC | PRN
  Administered 2019-06-06: 25 ug via INTRAVENOUS

## 2019-06-06 MED ORDER — MIDAZOLAM HCL 2 MG/2ML IJ SOLN
INTRAMUSCULAR | Status: DC | PRN
  Administered 2019-06-06: 1 mg via INTRAVENOUS

## 2019-06-06 MED ORDER — SODIUM CHLORIDE 0.9 % IV SOLN: 250.0000 mL | INTRAVENOUS | Status: DC | PRN

## 2019-06-06 MED ORDER — SODIUM CHLORIDE 0.9% FLUSH: 3.0000 mL | Freq: Two times a day (BID) | INTRAVENOUS | Status: DC

## 2019-06-06 MED ORDER — NITROGLYCERIN IN D5W 200-5 MCG/ML-% IV SOLN
0.0000 ug/min | INTRAVENOUS | Status: DC
  Administered 2019-06-06: 40 ug/min via INTRAVENOUS
  Filled 2019-06-06: qty 250

## 2019-06-06 MED ORDER — ALPRAZOLAM 0.5 MG PO TABS
0.5000 mg | ORAL_TABLET | Freq: Two times a day (BID) | ORAL | Status: DC
  Administered 2019-06-06 – 2019-06-07 (×3): 0.5 mg via ORAL
  Filled 2019-06-06 (×3): qty 1

## 2019-06-06 MED ORDER — HEPARIN (PORCINE) 25000 UT/250ML-% IV SOLN
800.0000 [IU]/h | INTRAVENOUS | Status: DC
  Administered 2019-06-06: 07:00:00 800 [IU]/h via INTRAVENOUS
  Filled 2019-06-06: qty 250

## 2019-06-06 MED ORDER — ASPIRIN 81 MG PO CHEW
CHEWABLE_TABLET | ORAL | Status: AC
  Filled 2019-06-06: qty 1

## 2019-06-06 MED ORDER — INSULIN ASPART 100 UNIT/ML ~~LOC~~ SOLN: 0.0000 [IU] | Freq: Every day | SUBCUTANEOUS | Status: DC

## 2019-06-06 MED ORDER — SODIUM CHLORIDE 0.9 % IV SOLN: INTRAVENOUS | Status: DC

## 2019-06-06 MED ORDER — ASPIRIN 300 MG RE SUPP: 300.0000 mg | RECTAL | Status: AC

## 2019-06-06 MED ORDER — VERAPAMIL HCL 2.5 MG/ML IV SOLN
INTRAVENOUS | Status: DC | PRN
  Administered 2019-06-06: 2.5 mg via INTRA_ARTERIAL

## 2019-06-06 MED ORDER — ONDANSETRON HCL 4 MG/2ML IJ SOLN: 4.0000 mg | Freq: Four times a day (QID) | INTRAMUSCULAR | Status: DC | PRN

## 2019-06-06 MED ORDER — FENTANYL CITRATE (PF) 100 MCG/2ML IJ SOLN
INTRAMUSCULAR | Status: AC
  Filled 2019-06-06: qty 2

## 2019-06-06 MED ORDER — PANTOPRAZOLE SODIUM 40 MG PO TBEC
40.0000 mg | DELAYED_RELEASE_TABLET | Freq: Every day | ORAL | Status: DC
  Administered 2019-06-07: 10:00:00 40 mg via ORAL
  Filled 2019-06-06: qty 1

## 2019-06-06 MED ORDER — ACETAMINOPHEN 325 MG PO TABS
650.0000 mg | ORAL_TABLET | ORAL | Status: DC | PRN
  Administered 2019-06-06 (×2): 650 mg via ORAL
  Filled 2019-06-06: qty 2

## 2019-06-06 MED ORDER — ACETAMINOPHEN 325 MG PO TABS
ORAL_TABLET | ORAL | Status: AC
  Filled 2019-06-06: qty 2

## 2019-06-06 MED ORDER — POTASSIUM CHLORIDE CRYS ER 20 MEQ PO TBCR
20.0000 meq | EXTENDED_RELEASE_TABLET | Freq: Three times a day (TID) | ORAL | Status: DC
  Administered 2019-06-06: 22:00:00 20 meq via ORAL
  Filled 2019-06-06: qty 1

## 2019-06-06 MED ORDER — PRASUGREL HCL 10 MG PO TABS
10.0000 mg | ORAL_TABLET | Freq: Every day | ORAL | Status: DC
  Administered 2019-06-07: 10 mg via ORAL
  Filled 2019-06-06 (×2): qty 1

## 2019-06-06 MED ORDER — HEPARIN (PORCINE) IN NACL 1000-0.9 UT/500ML-% IV SOLN
INTRAVENOUS | Status: AC
  Filled 2019-06-06: qty 1000

## 2019-06-06 MED ORDER — SODIUM CHLORIDE 0.9 % IV SOLN: INTRAVENOUS | Status: AC

## 2019-06-06 MED ORDER — MORPHINE SULFATE (PF) 2 MG/ML IV SOLN
1.0000 mg | Freq: Once | INTRAVENOUS | Status: DC
  Filled 2019-06-06: qty 1

## 2019-06-06 SURGICAL SUPPLY — 8 items
CATH INFINITI 5FR JK (CATHETERS) ×2 IMPLANT
CATH INFINITI JR4 5F (CATHETERS) ×2 IMPLANT
DEVICE RAD TR BAND REGULAR (VASCULAR PRODUCTS) ×2 IMPLANT
GLIDESHEATH SLEND SS 6F .021 (SHEATH) ×2 IMPLANT
GUIDEWIRE INQWIRE 1.5J.035X260 (WIRE) IMPLANT
INQWIRE 1.5J .035X260CM (WIRE) ×3
KIT MANI 3VAL PERCEP (MISCELLANEOUS) ×3 IMPLANT
PACK CARDIAC CATH (CUSTOM PROCEDURE TRAY) ×3 IMPLANT

## 2019-06-06 NOTE — ED Notes (Addendum)
Pt called out and complaining of neck, jaw and back pain. Pt states 6/10 pain. Pt's BP at 95/60, Nitro drip turned back to 8mcg/min.  Messaged Admit MD to inform of current pt status.

## 2019-06-06 NOTE — Progress Notes (Signed)
ANTICOAGULATION CONSULT NOTE - Initial Consult  Pharmacy Consult for Heparin Indication: chest pain/ACS  Allergies  Allergen Reactions  . Almond Oil Hives  . Atorvastatin     myalgias   Patient Measurements: Height: 5\' 4"  (162.6 cm) Weight: 65.8 kg (145 lb) IBW/kg (Calculated) : 54.7 HEPARIN DW (KG): 65.8  Vital Signs: Temp: 98.3 F (36.8 C) (04/19 0536) Temp Source: Oral (04/19 0536) BP: 116/75 (04/19 0600) Pulse Rate: 92 (04/19 0600)  Labs: Recent Labs    06/06/19 0537  HGB 12.5  HCT 36.9  PLT 462*  CREATININE 1.38*  TROPONINIHS 4    Estimated Creatinine Clearance: 39.9 mL/min (A) (by C-G formula based on SCr of 1.38 mg/dL (H)).  Medical History: Past Medical History:  Diagnosis Date  . Acute pancreatitis   . CAD (coronary artery disease)    a. 08/2017 Neg MV; b. 01/2019 CTA Chest: Mild cor Ca2+; c. 02/2019 NSTEMI/PCI: LM nl, LAD nl, LCX 95p/m (2.75x15 Resolute Onyx DES), RCA nl.  . Chronic pain syndrome   . CKD (chronic kidney disease), stage III   . COPD (chronic obstructive pulmonary disease) ([facility])   . Depression   . Diabetes mellitus without complication ([facility])   . Headache(784.0)    migraines  . History of echocardiogram    a. 04/2018 Echo: EF 55-60%. No significant valvular dzs; b. 02/2019 Echo: EF 60-65%. Nl RV fxn.  . Hypercholesteremia   . Hypertension   . Hyperthyroidism   . Hypokalemia   . Migraine   . Narcotic abuse ([facility])   . RA (rheumatoid arthritis) ([facility])     Medications:  (Not in a hospital admission)   Assessment: No anticoagulants PTA per med list.  Baseline labs ordered.    Goal of Therapy:  Heparin level 0.3-0.7 units/ml Monitor platelets by anticoagulation protocol: Yes   Plan:  Heparin bolus 4000 units x 1 then infusion at 800 units/hr Check HL 6 hours after heparin started  03/2019 A 06/06/2019,6:38 AM

## 2019-06-06 NOTE — ED Notes (Signed)
Pt sleeping. No needs expressed at present.

## 2019-06-06 NOTE — ED Notes (Signed)
DG at bedside. 

## 2019-06-06 NOTE — ED Provider Notes (Signed)
[facility]  [facility] Emergency Department Provider Note  ____________________________________________  Time seen: Approximately 5:46 AM  I have reviewed the triage vital signs and the nursing notes.   HISTORY  Chief Complaint Chest Pain   HPI Whitney Bernard is a 59 y.o. female with a history of CAD status post STEMI in January with DES to LCx, CKD, COPD, diabetes, hypertension, hyperlipidemia, RA, chronic pain syndrome who presents for evaluation of chest pain.  Patient reports the pain has been on and off for the last 12 hours.  She describes the pain as pressure in the center of her chest radiating to the upper back and her jaw associated with shortness of breath.  No nausea, dizziness, or diaphoresis.  She reports the pain is similar to her prior heart attack although in January the pain was not radiating to her back.  She endorses compliance with Effient. No cough or fever. The pain can happen at rest and with minimal exertion.  Usually lasts 30 minutes at a time and resolves without intervention.  She reports more than 10 episodes over the last 12 hours.  She denies any personal or family history of blood clots, recent travel immobilization, leg pain or swelling, exogenous hormones, hemoptysis.  The pain is not sharp or pleuritic in nature.  No abdominal pain.  Patient received full aspirin per EMS and 1 spray of nitro which improved the pain slightly.  Right now it is 9 out of 10.  Patient also complaining of reflux for the last several hours which he describes as a burning sensation in her throat.  Past Medical History:  Diagnosis Date  . Acute pancreatitis   . CAD (coronary artery disease)    a. 08/2017 Neg MV; b. 01/2019 CTA Chest: Mild cor Ca2+; c. 02/2019 NSTEMI/PCI: LM nl, LAD nl, LCX 95p/m (2.75x15 Resolute Onyx DES), RCA nl.  . Chronic pain syndrome   . CKD (chronic kidney disease), stage III   . COPD (chronic obstructive pulmonary disease) ([place])   .  Depression   . Diabetes mellitus without complication ([place])   . Headache(784.0)    migraines  . History of echocardiogram    a. 04/2018 Echo: EF 55-60%. No significant valvular dzs; b. 02/2019 Echo: EF 60-65%. Nl RV fxn.  . Hypercholesteremia   . Hypertension   . Hyperthyroidism   . Hypokalemia   . Migraine   . Narcotic abuse ([place])   . RA (rheumatoid arthritis) [facility])     Patient Active Problem List   Diagnosis Date Noted  . COPD (chronic obstructive pulmonary disease) ([place])   . Hypertension   . CKD (chronic kidney disease), stage IIIa   . Chest pain   . NSTEMI (non-ST elevated myocardial infarction) ([place])   . Hyperkalemia   . Elevated troponin   . GERD (gastroesophageal reflux disease)   . Type II diabetes mellitus with renal manifestations ([place])   . Chest pain, non-cardiac 08/23/2017  . Protein-calorie malnutrition, severe 12/26/2014  . 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 Loose stools   . Diarrhea   . Hypokalemia 12/24/2014  . BP (high blood pressure) 02/23/2013  . Misuse of prescription only drugs 02/23/2013  . Depression with anxiety 06/22/2012  . Colitis 05/11/2012  . Clinical depression 05/11/2012  . Hypercholesterolemia 05/11/2012  . Gastroduodenal ulcer 03/23/2012    Past Surgical History:  Procedure Laterality Date  . ANTERIOR CERVICAL DECOMP/DISCECTOMY FUSION N/A 11/26/2012   Procedure: ANTERIOR CERVICAL DECOMPRESSION/DISCECTOMY FUSION 2 LEVELS;  Surgeon: Olga Coaster  Kritzer, MD;  Location: [facility] NEURO ORS;  Service: Neurosurgery;  Laterality: N/A;  ANTERIOR CERVICAL DECOMPRESSION/DISCECTOMY FUSION 2 LEVELS  . CERVICAL POLYPECTOMY    . CHOLECYSTECTOMY    . CORONARY STENT INTERVENTION N/A 02/23/2019   Procedure: CORONARY STENT INTERVENTION;  Surgeon: Yvonne Kendall, MD;  Location: [facility] INVASIVE CV LAB;  Service: Cardiovascular;  Laterality: N/A;  . ESOPHAGOGASTRODUODENOSCOPY N/A 12/26/2014   Procedure: ESOPHAGOGASTRODUODENOSCOPY (EGD);  Surgeon: Midge Minium, MD;   Location: [facility] ENDOSCOPY;  Service: Endoscopy;  Laterality: N/A;  . FRACTURE SURGERY    . LEFT HEART CATH AND CORONARY ANGIOGRAPHY N/A 02/23/2019   Procedure: LEFT HEART CATH AND CORONARY ANGIOGRAPHY;  Surgeon: Antonieta Iba, MD;  Location: [facility] INVASIVE CV LAB;  Service: Cardiovascular;  Laterality: N/A;  . NECK SURGERY    . TUBAL LIGATION    . WISDOM TOOTH EXTRACTION      Prior to Admission medications   Medication Sig Start Date End Date Taking? Authorizing Provider  busPIRone (BUSPAR) 10 MG tablet Take 10 mg by mouth 2 (two) times daily.  04/26/18   [provider]  Butalbital-APAP-Caffeine 50-300-40 MG CAPS Take by mouth 3 (three) times daily as needed for headache.  04/26/18   [provider]  ezetimibe (ZETIA) 10 MG tablet Take 1 tablet (10 mg total) by mouth daily. 02/26/19   Amin, Loura Halt, MD  ferrous sulfate 325 (65 FE) MG tablet Take 1 tablet (325 mg total) by mouth 2 (two) times daily with a meal. 02/25/19 04/26/19  Amin, Loura Halt, MD  furosemide (LASIX) 40 MG tablet Take 40 mg by mouth daily.    [provider]  omeprazole (PRILOSEC) 20 MG capsule Take 20 mg by mouth daily.    [provider]  PARoxetine (PAXIL) 40 MG tablet Take 40 mg by mouth daily.    [provider]  potassium chloride SA (K-DUR,KLOR-CON) 20 MEQ tablet Take 20 mEq by mouth 3 (three) times daily.     [provider]  prasugrel (EFFIENT) 10 MG TABS tablet Take 1 tablet (10 mg total) by mouth daily. 02/26/19   Amin, Loura Halt, MD  rosuvastatin (CRESTOR) 10 MG tablet Take 1 tablet (10 mg total) by mouth daily at 6 PM. 02/25/19   Amin, Loura Halt, MD    Allergies Almond oil and Atorvastatin  Family History  Problem Relation Age of Onset  . CAD Father   . Alzheimer's disease Other     Social History Social History   Tobacco Use  . Smoking status: Former Smoker    Packs/day: 0.50    Years: 38.00    Pack years: 19.00    Types: Cigarettes    Quit  date: 11/14/2012    Years since quitting: 6.5  . Smokeless tobacco: Never Used  Substance Use Topics  . Alcohol use: Not Currently    Comment: occ  . Drug use: No    Review of Systems  Constitutional: Negative for fever. Eyes: Negative for visual changes. ENT: Negative for sore throat. Neck: No neck pain  Cardiovascular: + chest pain. Respiratory: + shortness of breath. Gastrointestinal: Negative for abdominal pain, vomiting or diarrhea. Genitourinary: Negative for dysuria. Musculoskeletal: Negative for back pain. Skin: Negative for rash. Neurological: Negative for headaches, weakness or numbness. Psych: No SI or HI  ____________________________________________   PHYSICAL EXAM:  VITAL SIGNS: ED Triage Vitals  Enc Vitals Group     BP 06/06/19 0536 (!) 147/92     Pulse Rate 06/06/19 0536 82  Resp 06/06/19 0536 20     Temp 06/06/19 0536 98.3 F (36.8 C)     Temp Source 06/06/19 0536 Oral     SpO2 06/06/19 0536 98 %     Weight 06/06/19 0537 145 lb (65.8 kg)     Height 06/06/19 0537 5\' 4"  (1.626 m)     Head Circumference --      Peak Flow --      Pain Score 06/06/19 0537 9     Pain Loc --      Pain Edu? --      Excl. in GC? --     Constitutional: Alert and oriented. Well appearing and in no apparent distress. HEENT:      Head: Normocephalic and atraumatic.         Eyes: Conjunctivae are normal. Sclera is non-icteric.       Mouth/Throat: Mucous membranes are moist.       Neck: Supple with no signs of meningismus. Cardiovascular: Regular rate and rhythm. No murmurs, gallops, or rubs. 2+ symmetrical distal pulses are present in all extremities. No JVD. Respiratory: Normal respiratory effort. Lungs are clear to auscultation bilaterally. No wheezes, crackles, or rhonchi.  Gastrointestinal: Soft, non tender, and non distended Musculoskeletal: No edema, cyanosis, or erythema of extremities. Neurologic: Normal speech and language. Face is symmetric. Moving all  extremities. No gross focal neurologic deficits are appreciated. Skin: Skin is warm, dry and intact. No rash noted. Psychiatric: Mood and affect are normal. Speech and behavior are normal.  ____________________________________________   LABS (all labs ordered are listed, but only abnormal results are displayed)  Labs Reviewed  CBC WITH DIFFERENTIAL/PLATELET - Abnormal; Notable for the following components:      Result Value   WBC 15.5 (*)    Platelets 462 (*)    Neutro Abs 10.6 (*)    Monocytes Absolute 1.1 (*)    All other components within normal limits  BASIC METABOLIC PANEL - Abnormal; Notable for the following components:   CO2 21 (*)    Glucose, Bld 163 (*)    BUN 26 (*)    Creatinine, Ser 1.38 (*)    GFR calc non Af Amer 41 (*)    GFR calc Af Amer 48 (*)    All other components within normal limits  RESPIRATORY PANEL BY RT PCR (FLU A&B, COVID)  HEPATIC FUNCTION PANEL  LIPASE, BLOOD  TROPONIN I (HIGH SENSITIVITY)   ____________________________________________  EKG  ED ECG REPORT I, 06/08/19, the attending physician, personally viewed and interpreted this ECG.  Normal sinus rhythm, rate of 85, normal intervals, normal axis, no ST elevations or depressions. ____________________________________________  RADIOLOGY  I have personally reviewed the images performed during this visit and I agree with the Radiologist's read.   Interpretation by Radiologist:  DG Chest Portable 1 View  Result Date: 06/06/2019 CLINICAL DATA:  59 year old female with chest pain since yesterday evening. Pressure radiating to the back and jaw. EXAM: PORTABLE CHEST 1 VIEW COMPARISON:  Chest radiographs 02/22/2019. CTA chest 02/06/2019. FINDINGS: Portable AP upright view at 0549 hours. Improved lung volumes since January. Mild eventration of the right hemidiaphragm, normal variant. Cardiac and mediastinal contours are stable and within normal limits. Visualized tracheal air column is  within normal limits. Allowing for portable technique the lungs are clear. Prior cervical ACDF. No acute osseous abnormality identified. Paucity of bowel gas in the upper abdomen. IMPRESSION: Negative portable chest. Electronically Signed   By: February M.D.   On:  06/06/2019 06:27      ____________________________________________   PROCEDURES  Procedure(s) performed:yes .1-3 Lead EKG Interpretation Performed by: Nita Sickle, MD Authorized by: Nita Sickle, MD     Interpretation: normal     ECG rate:  70   ECG rate assessment: normal     Rhythm: sinus rhythm     Ectopy: none     Conduction: normal     Critical Care performed: yes  CRITICAL CARE Performed by: Nita Sickle  ?  Total critical care time: 35 min  Critical care time was exclusive of separately billable procedures and treating other patients.  Critical care was necessary to treat or prevent imminent or life-threatening deterioration.  Critical care was time spent personally by me on the following activities: development of treatment plan with patient and/or surrogate as well as nursing, discussions with consultants, evaluation of patient's response to treatment, examination of patient, obtaining history from patient or surrogate, ordering and performing treatments and interventions, ordering and review of laboratory studies, ordering and review of radiographic studies, pulse oximetry and re-evaluation of patient's condition.  ____________________________________________   INITIAL IMPRESSION / ASSESSMENT AND PLAN / ED COURSE   59 y.o. female with a history of CAD status post STEMI in January with DES to LCx, CKD, COPD, diabetes, hypertension, hyperlipidemia, RA, chronic pain syndrome who presents for evaluation of chest pain.  Patient is well-appearing with normal vitals, strong pulses in all 4 extremities, clear lungs, no JVD, no asymmetric leg swelling or bilateral pitting edema, no hypoxia,  tachypnea, tachycardia.  Differential diagnoses including ACS versus GERD versus esophageal spasm versus pericarditis versus myocarditis versus MSK versus pneumonia.  EKG showing no acute ischemic changes.  Will give sublingual nitro.  Patient was connected to telemetry immediately for close monitoring. Will get HS- trop x 2, labs, CXR.  Old medical records reviewed showing the patient had a heart attack in January which resulted in a drug-eluting stent to the left circumflex.  _________________________ 6:13 AM on 06/06/2019 -----------------------------------------  Patient reassessed.  Pain down from a 9 to a 4 after 2 sublingual nitros. Will start patient on nitro gtt for pain. First troponin negative therefore will hold of heparin for now. Will consult Hospitalist for admission.   _________________________ 6:32 AM on 06/06/2019 -----------------------------------------  Discussed with Dr. Para March who recommended initiating heparin gtt. Order done. Patient currently on IV nitro gtt and pain controlled.     _____________________________________________ Please note:  Patient was evaluated in Emergency Department today for the symptoms described in the history of present illness. Patient was evaluated in the context of the global COVID-19 pandemic, which necessitated consideration that the patient might be at risk for infection with the SARS-CoV-2 virus that causes COVID-19. Institutional protocols and algorithms that pertain to the evaluation of patients at risk for COVID-19 are in a state of rapid change based on information released by regulatory bodies including the CDC and federal and state organizations. These policies and algorithms were followed during the patient's care in the ED.  Some ED evaluations and interventions may be delayed as a result of limited staffing during the pandemic.   [place] Controlled Substance Database was reviewed by  me. ____________________________________________   FINAL CLINICAL IMPRESSION(S) / ED DIAGNOSES   Final diagnoses:  Unstable angina (HCC)      NEW MEDICATIONS STARTED DURING THIS VISIT:  ED Discharge Orders    None       Note:  This document was prepared using Dragon voice recognition software and may  include unintentional dictation errors.    Don Perking, [place], MD 06/06/19 [phone]

## 2019-06-06 NOTE — Interval H&P Note (Signed)
History and Physical Interval Note:  06/06/2019 4:09 PM  Whitney Bernard  has presented today for surgery, with the diagnosis of unstable angina.  The various methods of treatment have been discussed with the patient and family. After consideration of risks, benefits and other options for treatment, the patient has consented to  Procedure(s): LEFT HEART CATH AND CORONARY ANGIOGRAPHY (N/A) as a surgical intervention.  The patient's history has been reviewed, patient examined, no change in status, stable for surgery.  I have reviewed the patient's chart and labs.  Questions were answered to the patient's satisfaction.     Lorine Bears

## 2019-06-06 NOTE — ED Notes (Signed)
Pt is very upset, pt states she is not going to stay here all day to get the heart cath in the afternoon, states "I need my xanax". Try to reason with the pt, I will order for her to have a hospital bed brought down to help with comfort. Try to be reassuring with the pt.

## 2019-06-06 NOTE — ED Notes (Signed)
Pt given pillow and resting comfortably in bed at this time

## 2019-06-06 NOTE — H&P (Signed)
History and Physical    Whitney Bernard [name] DOB: [birth year] DOA: 06/06/2019  PCP: Lorelee Market, MD   Patient coming from: Home  I have personally briefly reviewed patient's old medical records in [place]  Chief Complaint: Chest pain  HPI: Whitney Bernard is a 59 y.o. female with medical history significant for coronary artery disease status post STEMI in January, status post stent angioplasty to LCx, diabetes mellitus with complications of stage III chronic kidney disease, hypertension, rheumatoid arthritis and chronic pain syndrome who presents to the ER for evaluation of chest pain.  Patient reports that chest pain has been intermittent for the last 12 hours.  She describes it as pressure in the center of her chest radiating to her upper back and jaw associated with shortness of breath.  Pain is similar to her prior heart attack although in January the pain was not radiating to her back.  She denies having any nausea, dizziness, palpitations or diaphoresis.  She admits to being compliant with her antiplatelet agents.  Pain has been occurring both at rest and with exertion.  She rated her pain a 10 x 10 in intensity at its worst.  There was slight improvement in her pain following administration of aspirin and nitro spray by EMS.  She denies any personal or family history of blood clots, recent travel immobilization, leg pain or swelling, exogenous hormones, hemoptysis.   She denies having any fever, chills, cough, abdominal pain, dizziness or lightheadedness.  She denies having any headache or mental status changes. Her initial EKG showed no acute findings and troponin was negative  ED Course: Patient with history of coronary artery disease who presents for evaluation of chest pain similar to her prior STEMI 3 months ago.  Twelve-lead EKG did not show any acute ischemic changes.  Patient received nitroglycerin in the ER with slight improvement in her pain.  She is currently on a  nitroglycerin drip and a heparin drip and will be admitted to the hospital for further evaluation.  Review of Systems: As per HPI otherwise 10 point review of systems negative.    Past Medical History:  Diagnosis Date  . Acute pancreatitis   . CAD (coronary artery disease)    a. 08/2017 Neg MV; b. 01/2019 CTA Chest: Mild cor Ca2+; c. 02/2019 NSTEMI/PCI: LM nl, LAD nl, LCX 95p/m (2.75x15 Resolute Onyx DES), RCA nl.  . Chronic pain syndrome   . CKD (chronic kidney disease), stage III   . COPD (chronic obstructive pulmonary disease) ([place])   . Depression   . Diabetes mellitus without complication ([place])   . Headache(784.0)    migraines  . History of echocardiogram    a. 04/2018 Echo: EF 55-60%. No significant valvular dzs; b. 02/2019 Echo: EF 60-65%. Nl RV fxn.  . Hypercholesteremia   . Hypertension   . Hyperthyroidism   . Hypokalemia   . Migraine   . Narcotic abuse ([place])   . RA (rheumatoid arthritis) ([place])     Past Surgical History:  Procedure Laterality Date  . ANTERIOR CERVICAL DECOMP/DISCECTOMY FUSION N/A 11/26/2012   Procedure: ANTERIOR CERVICAL DECOMPRESSION/DISCECTOMY FUSION 2 LEVELS;  Surgeon: Faythe Ghee, MD;  Location: [facility];  Service: Neurosurgery;  Laterality: N/A;  ANTERIOR CERVICAL DECOMPRESSION/DISCECTOMY FUSION 2 LEVELS  . CERVICAL POLYPECTOMY    . CHOLECYSTECTOMY    . CORONARY STENT INTERVENTION N/A 02/23/2019   Procedure: CORONARY STENT INTERVENTION;  Surgeon: Nelva Bush, MD;  Location: [facility];  Service: Cardiovascular;  Laterality: N/A;  . ESOPHAGOGASTRODUODENOSCOPY N/A 12/26/2014   Procedure: ESOPHAGOGASTRODUODENOSCOPY (EGD);  Surgeon: Midge Minium, MD;  Location: [facility];  Service: Endoscopy;  Laterality: N/A;  . FRACTURE SURGERY    . LEFT HEART CATH AND CORONARY ANGIOGRAPHY N/A 02/23/2019   Procedure: LEFT HEART CATH AND CORONARY ANGIOGRAPHY;  Surgeon: Antonieta Iba, MD;  Location: [facility] INVASIVE CV LAB;  Service: Cardiovascular;   Laterality: N/A;  . NECK SURGERY    . TUBAL LIGATION    . WISDOM TOOTH EXTRACTION       reports that she quit smoking about 6 years ago. Her smoking use included cigarettes. She has a 19.00 pack-year smoking history. She has never used smokeless tobacco. She reports previous alcohol use. She reports that she does not use drugs.  Allergies  Allergen Reactions  . Almond Oil Hives  . Atorvastatin     myalgias  . Rosuvastatin     Family History  Problem Relation Age of Onset  . CAD Father   . Alzheimer's disease Other      Prior to Admission medications   Medication Sig Start Date End Date Taking? Authorizing Provider  ALPRAZolam Prudy Feeler) 0.5 MG tablet Take 0.5 mg by mouth 2 (two) times daily.   Yes [provider]  busPIRone (BUSPAR) 10 MG tablet Take 10 mg by mouth 2 (two) times daily.  04/26/18  Yes [provider]  Butalbital-APAP-Caffeine 50-300-40 MG CAPS Take by mouth 3 (three) times daily as needed for headache.  04/26/18  Yes [provider]  carisoprodol (SOMA) 350 MG tablet Take 350 mg by mouth 2 (two) times daily.   Yes [provider]  ezetimibe (ZETIA) 10 MG tablet Take 1 tablet (10 mg total) by mouth daily. 02/26/19  Yes Amin, Loura Halt, MD  furosemide (LASIX) 40 MG tablet Take 40 mg by mouth 2 (two) times daily.    Yes [provider]  omeprazole (PRILOSEC) 20 MG capsule Take 20 mg by mouth daily.   Yes [provider]  PARoxetine (PAXIL) 40 MG tablet Take 40 mg by mouth daily.   Yes [provider]  potassium chloride SA (K-DUR,KLOR-CON) 20 MEQ tablet Take 20 mEq by mouth 3 (three) times daily.    Yes [provider]  prasugrel (EFFIENT) 10 MG TABS tablet Take 1 tablet (10 mg total) by mouth daily. 02/26/19  Yes Amin, Loura Halt, MD  ferrous sulfate 325 (65 FE) MG tablet Take 1 tablet (325 mg total) by mouth 2 (two) times daily with a meal. 02/25/19 04/26/19  Amin, Loura Halt, MD  rosuvastatin  (CRESTOR) 10 MG tablet Take 1 tablet (10 mg total) by mouth daily at 6 PM. Patient not taking: Reported on 06/06/2019 02/25/19   Dimple Nanas, MD    Physical Exam: Vitals:   06/06/19 0620 06/06/19 0630 06/06/19 0640 06/06/19 0700  BP:  117/84 118/80 (!) 118/91  Pulse: 83 86 79 80  Resp: (!) 21 (!) 24 20 (!) 22  Temp:      TempSrc:      SpO2: 98% 97% 96% 98%  Weight:      Height:         Vitals:   06/06/19 0620 06/06/19 0630 06/06/19 0640 06/06/19 0700  BP:  117/84 118/80 (!) 118/91  Pulse: 83 86 79 80  Resp: (!) 21 (!) 24 20 (!) 22  Temp:      TempSrc:      SpO2: 98% 97% 96% 98%  Weight:  Height:        Constitutional: NAD, alert and oriented x 3 Eyes: PERRL, lids and conjunctivae normal ENMT: Mucous membranes are moist.  Neck: normal, supple, no masses, no thyromegaly Respiratory: clear to auscultation bilaterally, no wheezing, no crackles. Normal respiratory effort. No accessory muscle use.  Cardiovascular: Regular rate and rhythm, no murmurs / rubs / gallops. No extremity edema. 2+ pedal pulses. No carotid bruits.  Abdomen: no tenderness, no masses palpated. No hepatosplenomegaly. Bowel sounds positive.  Musculoskeletal: no clubbing / cyanosis. No joint deformity upper and lower extremities.  Skin: no rashes, lesions, ulcers.  Neurologic: No gross focal neurologic deficit. Psychiatric: Normal mood and affect.   Labs on Admission: I have personally reviewed following labs and imaging studies  CBC: Recent Labs  Lab 06/06/19 0537  WBC 15.5*  NEUTROABS 10.6*  HGB 12.5  HCT 36.9  MCV 90.2  PLT 462*   Basic Metabolic Panel: Recent Labs  Lab 06/06/19 0537  NA 138  K 3.9  CL 109  CO2 21*  GLUCOSE 163*  BUN 26*  CREATININE 1.38*  CALCIUM 9.0   GFR: Estimated Creatinine Clearance: 39.9 mL/min (A) (by C-G formula based on SCr of 1.38 mg/dL (H)). Liver Function Tests: Recent Labs  Lab 06/06/19 0537  AST 14*  ALT 16  ALKPHOS 106  BILITOT 0.6    PROT 7.4  ALBUMIN 3.7   Recent Labs  Lab 06/06/19 0537  LIPASE 39   No results for input(s): AMMONIA in the last 168 hours. Coagulation Profile: Recent Labs  Lab 06/06/19 0537  INR 1.0   Cardiac Enzymes: No results for input(s): CKTOTAL, CKMB, CKMBINDEX, TROPONINI in the last 168 hours. BNP (last 3 results) No results for input(s): PROBNP in the last 8760 hours. HbA1C: No results for input(s): HGBA1C in the last 72 hours. CBG: No results for input(s): GLUCAP in the last 168 hours. Lipid Profile: No results for input(s): CHOL, HDL, LDLCALC, TRIG, CHOLHDL, LDLDIRECT in the last 72 hours. Thyroid Function Tests: No results for input(s): TSH, T4TOTAL, FREET4, T3FREE, THYROIDAB in the last 72 hours. Anemia Panel: No results for input(s): VITAMINB12, FOLATE, FERRITIN, TIBC, IRON, RETICCTPCT in the last 72 hours. Urine analysis:    Component Value Date/Time   COLORURINE YELLOW (A) 05/02/2018 0751   APPEARANCEUR CLEAR (A) 05/02/2018 0751   APPEARANCEUR Clear 12/03/2013 1556   LABSPEC 1.023 05/02/2018 0751   LABSPEC 1.024 12/03/2013 1556   PHURINE 5.0 05/02/2018 0751   GLUCOSEU NEGATIVE 05/02/2018 0751   GLUCOSEU Negative 12/03/2013 1556   HGBUR NEGATIVE 05/02/2018 0751   BILIRUBINUR NEGATIVE 05/02/2018 0751   BILIRUBINUR Negative 12/03/2013 1556   KETONESUR NEGATIVE 05/02/2018 0751   PROTEINUR 30 (A) 05/02/2018 0751   NITRITE POSITIVE (A) 05/02/2018 0751   LEUKOCYTESUR TRACE (A) 05/02/2018 0751   LEUKOCYTESUR Negative 12/03/2013 1556    Radiological Exams on Admission: DG Chest Portable 1 View  Result Date: 06/06/2019 CLINICAL DATA:  59 year old female with chest pain since yesterday evening. Pressure radiating to the back and jaw. EXAM: PORTABLE CHEST 1 VIEW COMPARISON:  Chest radiographs 02/22/2019. CTA chest 02/06/2019. FINDINGS: Portable AP upright view at 0549 hours. Improved lung volumes since January. Mild eventration of the right hemidiaphragm, normal variant.  Cardiac and mediastinal contours are stable and within normal limits. Visualized tracheal air column is within normal limits. Allowing for portable technique the lungs are clear. Prior cervical ACDF. No acute osseous abnormality identified. Paucity of bowel gas in the upper abdomen. IMPRESSION: Negative portable chest. Electronically  Signed   By: Odessa Fleming M.D.   On: 06/06/2019 06:27    EKG: Independently reviewed.  Sinus rhythm  Assessment/Plan Principal Problem:   Ischemic chest pain (HCC) Active Problems:   CKD (chronic kidney disease), stage IIIa   Type II diabetes mellitus with renal manifestations (HCC)   CAD (coronary artery disease)     Ischemic chest pain Patient with known history of coronary artery disease who presents for evaluation of chest pain described as pressure-like with radiation to the back and jaw associated with shortness of breath. She has a history of coronary artery disease and is status post recent stent angioplasty. Continue heparin drip Continue nitroglycerin drip Continue Effient, beta-blockers and statins We will request cardiology consult    Diabetes mellitus with complications of stage III chronic kidney disease Glycemic control Monitor renal function closely, patient will need referral to nephrology as an outpatient   Depression Continue Buspirone and Xanax  DVT prophylaxis: Heparin Code Status: Full code Family Communication: Greater than 50% of time was spent discussing plan of care with patient at the bedside.  She verbalizes understanding and agrees with the plan. Disposition Plan: Back to previous home environment Consults called: Cardiology    Kaylean Tupou MD [facility]     06/06/2019, 8:08 AM

## 2019-06-06 NOTE — ED Notes (Signed)
Admit MD message regarding pt's xanax medication and if she can get it.  Pt wanting to leave. Pt states she will go to another hospital.

## 2019-06-06 NOTE — ED Notes (Signed)
Pt reports Dr Okey Dupre is cardiologist and stent placement in January,

## 2019-06-06 NOTE — Consult Note (Signed)
Cardiology Consultation:   Patient ID: Whitney Bernard; [id number]; 10-16-1960   Admit date: 06/06/2019 Date of Consult: 06/06/2019  Primary Care Provider: Evelene Croon, MD Primary Cardiologist: End   Patient Profile:   Whitney Bernard is a 59 y.o. female with a hx of CAD with NSTEMI in 02/2019 s/p PCI/DES to the mid LCx, diabetes, hypertension, hyperlipidemia with statin intolerance, hypothyroidism, chronic pain disorder with narcotic use, CKD stage II-III, COVID-19 infection diagnosed in 10/2018, rheumatoid arthritis, migraine disorder, COPD secondary topriortobacco abuse, colitis, diverticulitis and depression who is being seen today for the evaluation of chest pain at the request of Dr. Joylene Igo.  History of Present Illness:   [name] was admitted to the hospital in 02/2019 with a NSTEMI with HS-Tn peaking at 1667. She underwent LHC on 02/23/2019 that showed severe 1-vessel CAD with 95% mid LCx stenosis s/p PCI/DES. Otherwise, there was no obstructive disease. Echo showed an EF of 60-65%, no LVH, no RWMA, normal RVSF and cavity size. She was placed on ASA and Effient. Following her admission, she was lost to follow up, though does report compliance with DAPT and denies missing any doses. She also indicates she has not smoked since.  She developed substernal chest pressure that radiated to her neck, left jaw, left shoulder, and through to her back around 5:30 PM on 06/04/2019. There was some associated clamminess and SOB initially, though these subsequently resolved without intervention. Her pain, at it's worst, was 10/10 and felt similar to her MI, outside of the back pain. EKG was not acute as below. HS-Tn 4 with an unchanged delta. CXR not acute. She was given ASA 324 mg x 1 and started on a heparin gtt as well as nitro gtt, which led to relative hypotension requiring this to be tapered with improvement in her BP. Currently, she is asymptomatic.   Past Medical History:  Diagnosis Date  .  Acute pancreatitis   . CAD (coronary artery disease)    a. 08/2017 Neg MV; b. 01/2019 CTA Chest: Mild cor Ca2+; c. 02/2019 NSTEMI/PCI: LM nl, LAD nl, LCX 95p/m (2.75x15 Resolute Onyx DES), RCA nl.  . Chronic pain syndrome   . CKD (chronic kidney disease), stage III   . COPD (chronic obstructive pulmonary disease) (HCC)   . Depression   . Diabetes mellitus without complication (HCC)   . Headache(784.0)    migraines  . History of echocardiogram    a. 04/2018 Echo: EF 55-60%. No significant valvular dzs; b. 02/2019 Echo: EF 60-65%. Nl RV fxn.  . Hypercholesteremia   . Hypertension   . Hyperthyroidism   . Hypokalemia   . Migraine   . Narcotic abuse (HCC)   . RA (rheumatoid arthritis) (HCC)     Past Surgical History:  Procedure Laterality Date  . ANTERIOR CERVICAL DECOMP/DISCECTOMY FUSION N/A 11/26/2012   Procedure: ANTERIOR CERVICAL DECOMPRESSION/DISCECTOMY FUSION 2 LEVELS;  Surgeon: Reinaldo Meeker, MD;  Location: [facility];  Service: Neurosurgery;  Laterality: N/A;  ANTERIOR CERVICAL DECOMPRESSION/DISCECTOMY FUSION 2 LEVELS  . CERVICAL POLYPECTOMY    . CHOLECYSTECTOMY    . CORONARY STENT INTERVENTION N/A 02/23/2019   Procedure: CORONARY STENT INTERVENTION;  Surgeon: Yvonne Kendall, MD;  Location: [facility];  Service: Cardiovascular;  Laterality: N/A;  . ESOPHAGOGASTRODUODENOSCOPY N/A 12/26/2014   Procedure: ESOPHAGOGASTRODUODENOSCOPY (EGD);  Surgeon: Midge Minium, MD;  Location: [facility];  Service: Endoscopy;  Laterality: N/A;  . FRACTURE SURGERY    . LEFT HEART CATH AND CORONARY ANGIOGRAPHY  N/A 02/23/2019   Procedure: LEFT HEART CATH AND CORONARY ANGIOGRAPHY;  Surgeon: Antonieta Iba, MD;  Location: [facility] INVASIVE CV LAB;  Service: Cardiovascular;  Laterality: N/A;  . NECK SURGERY    . TUBAL LIGATION    . WISDOM TOOTH EXTRACTION       Home Meds: Prior to Admission medications   Medication Sig Start Date End Date Taking? Authorizing Provider  ALPRAZolam Prudy Feeler)  0.5 MG tablet Take 0.5 mg by mouth 2 (two) times daily.   Yes [provider]  busPIRone (BUSPAR) 10 MG tablet Take 10 mg by mouth 2 (two) times daily.  04/26/18  Yes [provider]  Butalbital-APAP-Caffeine 50-300-40 MG CAPS Take by mouth 3 (three) times daily as needed for headache.  04/26/18  Yes [provider]  carisoprodol (SOMA) 350 MG tablet Take 350 mg by mouth 2 (two) times daily.   Yes [provider]  ezetimibe (ZETIA) 10 MG tablet Take 1 tablet (10 mg total) by mouth daily. 02/26/19  Yes Amin, Loura Halt, MD  furosemide (LASIX) 40 MG tablet Take 40 mg by mouth 2 (two) times daily.    Yes [provider]  omeprazole (PRILOSEC) 20 MG capsule Take 20 mg by mouth daily.   Yes [provider]  PARoxetine (PAXIL) 40 MG tablet Take 40 mg by mouth daily.   Yes [provider]  potassium chloride SA (K-DUR,KLOR-CON) 20 MEQ tablet Take 20 mEq by mouth 3 (three) times daily.    Yes [provider]  prasugrel (EFFIENT) 10 MG TABS tablet Take 1 tablet (10 mg total) by mouth daily. 02/26/19  Yes Amin, Loura Halt, MD  ferrous sulfate 325 (65 FE) MG tablet Take 1 tablet (325 mg total) by mouth 2 (two) times daily with a meal. 02/25/19 04/26/19  Amin, Loura Halt, MD  rosuvastatin (CRESTOR) 10 MG tablet Take 1 tablet (10 mg total) by mouth daily at 6 PM. Patient not taking: Reported on 06/06/2019 02/25/19   Dimple Nanas, MD    Inpatient Medications: Scheduled Meds: . ALPRAZolam  0.5 mg Oral BID  . [START ON 06/07/2019] aspirin EC  81 mg Oral Daily  . busPIRone  10 mg Oral BID  . carisoprodol  350 mg Oral BID  . ezetimibe  10 mg Oral Daily  . ferrous sulfate  325 mg Oral BID WC  . furosemide  40 mg Oral BID  . insulin aspart  0-15 Units Subcutaneous Q4H  . pantoprazole  40 mg Oral Daily  . PARoxetine  40 mg Oral Daily  . potassium chloride SA  20 mEq Oral TID  . prasugrel  10 mg Oral Daily   Continuous Infusions: . heparin  800 Units/hr (06/06/19 0659)  . nitroGLYCERIN 35 mcg/min (06/06/19 0808)   PRN Meds: acetaminophen, butalbital-acetaminophen-caffeine, nitroGLYCERIN, ondansetron (ZOFRAN) IV  Allergies:   Allergies  Allergen Reactions  . Almond Oil Hives  . Atorvastatin     myalgias  . Rosuvastatin     Social History:   Social History   Socioeconomic History  . Marital status: Married    Spouse name: Not on file  . Number of children: Not on file  . Years of education: Not on file  . Highest education level: Not on file  Occupational History  . Not on file  Tobacco Use  . Smoking status: Former Smoker    Packs/day: 0.50    Years: 38.00    Pack years: 19.00    Types: Cigarettes  Quit date: 11/14/2012    Years since quitting: 6.5  . Smokeless tobacco: Never Used  Substance and Sexual Activity  . Alcohol use: Not Currently    Comment: occ  . Drug use: No  . Sexual activity: Yes    Birth control/protection: Surgical  Other Topics Concern  . Not on file  Social History Narrative  . Not on file   Social Determinants of Health   Financial Resource Strain:   . Difficulty of Paying Living Expenses:   Food Insecurity:   . Worried About Running Out of Food in the Last Year:   . Ran Out of Food in the Last Year:   Transportation Needs:   . Lack of Transportation (Medical):   . Lack of Transportation (Non-Medical):   Physical Activity:   . Days of Exercise per Week:   . Minutes of Exercise per Session:   Stress:   . Feeling of Stress :   Social Connections:   . Frequency of Communication with Friends and Family:   . Frequency of Social Gatherings with Friends and Family:   . Attends Religious Services:   . Active Member of Clubs or Organizations:   . Attends Club or Organization Meetings:   . Marital Status:   Intimate Partner Violence:   . Fear of Current or Ex-Partner:   . Emotionally Abused:   . Physically Abused:   . Sexually Abused:      Family History:   Family  History  Problem Relation Age of Onset  . CAD Father   . Alzheimer's disease Other     ROS:  Review of Systems  Constitutional: Positive for diaphoresis and malaise/fatigue. Negative for chills, fever and weight loss.  HENT: Negative for congestion.   Eyes: Negative for discharge and redness.  Respiratory: Positive for shortness of breath. Negative for cough, sputum production and wheezing.   Cardiovascular: Positive for chest pain. Negative for palpitations, orthopnea, claudication, leg swelling and PND.  Gastrointestinal: Negative for abdominal pain, blood in stool, heartburn, melena, nausea and vomiting.  Musculoskeletal: Positive for back pain. Negative for falls and myalgias.  Skin: Negative for rash.  Neurological: Positive for weakness. Negative for dizziness, tingling, tremors, sensory change, speech change, focal weakness and loss of consciousness.  Endo/Heme/Allergies: Does not bruise/bleed easily.  Psychiatric/Behavioral: Negative for substance abuse. The patient is not nervous/anxious.   All other systems reviewed and are negative.     Physical Exam/Data:   Vitals:   06/06/19 0730 06/06/19 0745 06/06/19 0800 06/06/19 0830  BP: 110/72  95/60 (!) 114/93  Pulse: 81 82 85 81  Resp: (!) 23 19 19 (!) 21  Temp:      TempSrc:      SpO2: 96% 97% 97% 97%  Weight:      Height:       No intake or output data in the 24 hours ending 06/06/19 0933 Filed Weights   06/06/19 0537  Weight: 65.8 kg   Body mass index is 24.89 kg/m.   Physical Exam: General: Well developed, well nourished, in no acute distress. Head: Normocephalic, atraumatic, sclera non-icteric, no xanthomas, nares without discharge.  Neck: Negative for carotid bruits. JVD not elevated. Lungs: Clear bilaterally to auscultation without wheezes, rales, or rhonchi. Breathing is unlabored. Heart: RRR with S1 S2. No murmurs, rubs, or gallops appreciated. Abdomen: Soft, non-tender, non-distended with normoactive  bowel sounds. No hepatomegaly. No rebound/guarding. No obvious abdominal masses. Msk:  Strength and tone appear normal for age. Extremities: No clubbing or   cyanosis. No edema. Distal pedal pulses are 2+ and equal bilaterally. Neuro: Alert and oriented X 3. No facial asymmetry. No focal deficit. Moves all extremities spontaneously. Psych:  Responds to questions appropriately with a normal affect.   EKG:  The EKG was personally reviewed and demonstrates: NSR, 85 bpm, low voltage along the precordial leads, baseline wandering and artifact, no acute st/t changes Telemetry:  Telemetry was personally reviewed and demonstrates: SR  Weights: [employer]   06/06/19 0537  Weight: 65.8 kg    Relevant CV Studies:  2D echo 02/23/2019: 1. Left ventricular ejection fraction, by visual estimation, is 60 to  65%. The left ventricle has normal function. There is no left ventricular  hypertrophy.  2. The left ventricle has no regional wall motion abnormalities.  3. Left ventricular diastolic parameters are indeterminate.  4. Global right ventricle has normal systolic function.The right  ventricular size is normal. No increase in right ventricular wall  thickness.  5. Left atrial size was normal.  6. TR signal is inadequate for assessing pulmonary artery systolic  pressure.  __________  LHC 02/23/2019: Coronary dominance: Right  Left mainstem:   Large vessel that bifurcates into the LAD and left circumflex, no significant disease noted  Left anterior descending (LAD):   Large vessel that extends to the apical region, diagonal branch 2 of moderate size, no significant disease noted  Left circumflex (LCx):  Large vessel with distal OM branch , 95% proximal to mid LCX disease (likely culprit vessel)  Right coronary artery (RCA):  Right dominant vessel with PL and PDA, no significant disease noted  Left ventriculography:  no significant aortic valve stenosis  Final Conclusions:   Severe  single vessel disease, of the LCX Otherwise mild lumenal irregularities  Recommendations:  Discussed with interventional cardiology, Dr. Okey Dupre, Plan will be for PCI of the LCX __________  PCI 02/23/2019: Conclusions: 1. Severe single-vessel coronary artery disease with 95% stenosis of the mid LCx.  See diagnostic catheterization report by Dr. Mariah Milling for further details. 2. Successful PCI to the mid LCx using Resolute Onyx 2.75 x 15 mm drug-eluting stent (postdilated to 3.1 mm) with 0% residual stenosis and TIMI-3 flow.  Recommendations: 1. Dual antiplatelet therapy with aspirin and prasugrel for at least 12 months. 2. Aggressive secondary prevention.  Laboratory Data:  Chemistry Recent Labs  Lab 06/06/19 0537  NA 138  K 3.9  CL 109  CO2 21*  GLUCOSE 163*  BUN 26*  CREATININE 1.38*  CALCIUM 9.0  GFRNONAA 41*  GFRAA 48*  ANIONGAP 8    Recent Labs  Lab 06/06/19 0537  PROT 7.4  ALBUMIN 3.7  AST 14*  ALT 16  ALKPHOS 106  BILITOT 0.6   Hematology Recent Labs  Lab 06/06/19 0537  WBC 15.5*  RBC 4.09  HGB 12.5  HCT 36.9  MCV 90.2  MCH 30.6  MCHC 33.9  RDW 13.9  PLT 462*   Cardiac EnzymesNo results for input(s): TROPONINI in the last 168 hours. No results for input(s): TROPIPOC in the last 168 hours.  BNPNo results for input(s): BNP, PROBNP in the last 168 hours.  DDimer No results for input(s): DDIMER in the last 168 hours.  Radiology/Studies:  DG Chest Portable 1 View  Result Date: 06/06/2019 IMPRESSION: Negative portable chest. Electronically Signed   By: Odessa Fleming M.D.   On: 06/06/2019 06:27    Assessment and Plan:   1. CAD involving the native coronary arteries with unstable angina: -Currently, symptom free -HS-Tn negative x  2 with a non-acute EKG -Symptoms are similar to her prior MI -Plan for LHC today with Dr. Fletcher Anon -Heparin gtt -DAPT with ASA and Effient  -Statin intolerant  -NPO -She was lost to follow up after her MI in 02/2019, stress the  importance of cardiology follow up -Risks and benefits of cardiac catheterization have been discussed with the patient including risks of bleeding, bruising, infection, kidney damage, stroke, heart attack, urgent need for bypass, injury to a limb, and death. The patient understands these risks and is willing to proceed with the procedure. All questions have been answered and concerns listened to  2. CKD stage II-III: -Stable -Gentle IV hydration  -Monitor post cath  3. Anemia: -Improved  4. HTN: -Blood pressure  5. HLD: -Statin intolerance  -LDL of 178 in 02/2019 with goal being < 70 -Lipid pending -Zetia -As an outpatient, plan for PCSK-9 inhibitor   6. Chronic pain/anxiety: -Management per IM   For questions or updates, please contact [place] Please consult [email or web address] for contact info under Cardiology/STEMI.   Signed, Christell Faith, PA-C [place] Pager: [phone] 06/06/2019, 9:33 AM

## 2019-06-06 NOTE — ED Notes (Signed)
Pt placed in hospital bed

## 2019-06-06 NOTE — ED Notes (Signed)
Called lab to add on A1C to blood already in lab. [name] to take care of it.

## 2019-06-06 NOTE — H&P (View-Only) (Signed)
Cardiology Consultation:   Patient ID: Whitney Bernard; [id number]; 10-16-1960   Admit date: 06/06/2019 Date of Consult: 06/06/2019  Primary Care Provider: Evelene Croon, MD Primary Cardiologist: End   Patient Profile:   Whitney Bernard is a 59 y.o. female with a hx of CAD with NSTEMI in 02/2019 s/p PCI/DES to the mid LCx, diabetes, hypertension, hyperlipidemia with statin intolerance, hypothyroidism, chronic pain disorder with narcotic use, CKD stage II-III, COVID-19 infection diagnosed in 10/2018, rheumatoid arthritis, migraine disorder, COPD secondary topriortobacco abuse, colitis, diverticulitis and depression who is being seen today for the evaluation of chest pain at the request of Dr. Joylene Igo.  History of Present Illness:   Whitney Bernard was admitted to the hospital in 02/2019 with a NSTEMI with HS-Tn peaking at 1667. She underwent LHC on 02/23/2019 that showed severe 1-vessel CAD with 95% mid LCx stenosis s/p PCI/DES. Otherwise, there was no obstructive disease. Echo showed an EF of 60-65%, no LVH, no RWMA, normal RVSF and cavity size. She was placed on ASA and Effient. Following her admission, she was lost to follow up, though does report compliance with DAPT and denies missing any doses. She also indicates she has not smoked since.  She developed substernal chest pressure that radiated to her neck, left jaw, left shoulder, and through to her back around 5:30 PM on 06/04/2019. There was some associated clamminess and SOB initially, though these subsequently resolved without intervention. Her pain, at it's worst, was 10/10 and felt similar to her MI, outside of the back pain. EKG was not acute as below. HS-Tn 4 with an unchanged delta. CXR not acute. She was given ASA 324 mg x 1 and started on a heparin gtt as well as nitro gtt, which led to relative hypotension requiring this to be tapered with improvement in her BP. Currently, she is asymptomatic.   Past Medical History:  Diagnosis Date  .  Acute pancreatitis   . CAD (coronary artery disease)    a. 08/2017 Neg MV; b. 01/2019 CTA Chest: Mild cor Ca2+; c. 02/2019 NSTEMI/PCI: LM nl, LAD nl, LCX 95p/m (2.75x15 Resolute Onyx DES), RCA nl.  . Chronic pain syndrome   . CKD (chronic kidney disease), stage III   . COPD (chronic obstructive pulmonary disease) (HCC)   . Depression   . Diabetes mellitus without complication (HCC)   . Headache(784.0)    migraines  . History of echocardiogram    a. 04/2018 Echo: EF 55-60%. No significant valvular dzs; b. 02/2019 Echo: EF 60-65%. Nl RV fxn.  . Hypercholesteremia   . Hypertension   . Hyperthyroidism   . Hypokalemia   . Migraine   . Narcotic abuse (HCC)   . RA (rheumatoid arthritis) (HCC)     Past Surgical History:  Procedure Laterality Date  . ANTERIOR CERVICAL DECOMP/DISCECTOMY FUSION N/A 11/26/2012   Procedure: ANTERIOR CERVICAL DECOMPRESSION/DISCECTOMY FUSION 2 LEVELS;  Surgeon: Reinaldo Meeker, MD;  Location: [facility];  Service: Neurosurgery;  Laterality: N/A;  ANTERIOR CERVICAL DECOMPRESSION/DISCECTOMY FUSION 2 LEVELS  . CERVICAL POLYPECTOMY    . CHOLECYSTECTOMY    . CORONARY STENT INTERVENTION N/A 02/23/2019   Procedure: CORONARY STENT INTERVENTION;  Surgeon: Yvonne Kendall, MD;  Location: [facility] INVASIVE CV LAB;  Service: Cardiovascular;  Laterality: N/A;  . ESOPHAGOGASTRODUODENOSCOPY N/A 12/26/2014   Procedure: ESOPHAGOGASTRODUODENOSCOPY (EGD);  Surgeon: Midge Minium, MD;  Location: [facility] ENDOSCOPY;  Service: Endoscopy;  Laterality: N/A;  . FRACTURE SURGERY    . LEFT HEART CATH AND CORONARY ANGIOGRAPHY  N/A 02/23/2019   Procedure: LEFT HEART CATH AND CORONARY ANGIOGRAPHY;  Surgeon: Antonieta Iba, MD;  Location: [facility] INVASIVE CV LAB;  Service: Cardiovascular;  Laterality: N/A;  . NECK SURGERY    . TUBAL LIGATION    . WISDOM TOOTH EXTRACTION       Home Meds: Prior to Admission medications   Medication Sig Start Date End Date Taking? Authorizing Provider  ALPRAZolam Prudy Feeler)  0.5 MG tablet Take 0.5 mg by mouth 2 (two) times daily.   Yes [provider]  busPIRone (BUSPAR) 10 MG tablet Take 10 mg by mouth 2 (two) times daily.  04/26/18  Yes [provider]  Butalbital-APAP-Caffeine 50-300-40 MG CAPS Take by mouth 3 (three) times daily as needed for headache.  04/26/18  Yes [provider]  carisoprodol (SOMA) 350 MG tablet Take 350 mg by mouth 2 (two) times daily.   Yes [provider]  ezetimibe (ZETIA) 10 MG tablet Take 1 tablet (10 mg total) by mouth daily. 02/26/19  Yes Amin, Loura Halt, MD  furosemide (LASIX) 40 MG tablet Take 40 mg by mouth 2 (two) times daily.    Yes [provider]  omeprazole (PRILOSEC) 20 MG capsule Take 20 mg by mouth daily.   Yes [provider]  PARoxetine (PAXIL) 40 MG tablet Take 40 mg by mouth daily.   Yes [provider]  potassium chloride SA (K-DUR,KLOR-CON) 20 MEQ tablet Take 20 mEq by mouth 3 (three) times daily.    Yes [provider]  prasugrel (EFFIENT) 10 MG TABS tablet Take 1 tablet (10 mg total) by mouth daily. 02/26/19  Yes Amin, Loura Halt, MD  ferrous sulfate 325 (65 FE) MG tablet Take 1 tablet (325 mg total) by mouth 2 (two) times daily with a meal. 02/25/19 04/26/19  Amin, Loura Halt, MD  rosuvastatin (CRESTOR) 10 MG tablet Take 1 tablet (10 mg total) by mouth daily at 6 PM. Patient not taking: Reported on 06/06/2019 02/25/19   Dimple Nanas, MD    Inpatient Medications: Scheduled Meds: . ALPRAZolam  0.5 mg Oral BID  . [START ON 06/07/2019] aspirin EC  81 mg Oral Daily  . busPIRone  10 mg Oral BID  . carisoprodol  350 mg Oral BID  . ezetimibe  10 mg Oral Daily  . ferrous sulfate  325 mg Oral BID WC  . furosemide  40 mg Oral BID  . insulin aspart  0-15 Units Subcutaneous Q4H  . pantoprazole  40 mg Oral Daily  . PARoxetine  40 mg Oral Daily  . potassium chloride SA  20 mEq Oral TID  . prasugrel  10 mg Oral Daily   Continuous Infusions: . heparin  800 Units/hr (06/06/19 0659)  . nitroGLYCERIN 35 mcg/min (06/06/19 0808)   PRN Meds: acetaminophen, butalbital-acetaminophen-caffeine, nitroGLYCERIN, ondansetron (ZOFRAN) IV  Allergies:   Allergies  Allergen Reactions  . Almond Oil Hives  . Atorvastatin     myalgias  . Rosuvastatin     Social History:   Social History   Socioeconomic History  . Marital status: Married    Spouse name: Not on file  . Number of children: Not on file  . Years of education: Not on file  . Highest education level: Not on file  Occupational History  . Not on file  Tobacco Use  . Smoking status: Former Smoker    Packs/day: 0.50    Years: 38.00    Pack years: 19.00    Types: Cigarettes  Quit date: 11/14/2012    Years since quitting: 6.5  . Smokeless tobacco: Never Used  Substance and Sexual Activity  . Alcohol use: Not Currently    Comment: occ  . Drug use: No  . Sexual activity: Yes    Birth control/protection: Surgical  Other Topics Concern  . Not on file  Social History Narrative  . Not on file   Social Determinants of Health   Financial Resource Strain:   . Difficulty of Paying Living Expenses:   Food Insecurity:   . Worried About Programme researcher, broadcasting/film/video in the Last Year:   . Barista in the Last Year:   Transportation Needs:   . Freight forwarder (Medical):   Marland Kitchen Lack of Transportation (Non-Medical):   Physical Activity:   . Days of Exercise per Week:   . Minutes of Exercise per Session:   Stress:   . Feeling of Stress :   Social Connections:   . Frequency of Communication with Friends and Family:   . Frequency of Social Gatherings with Friends and Family:   . Attends Religious Services:   . Active Member of Clubs or Organizations:   . Attends Banker Meetings:   Marland Kitchen Marital Status:   Intimate Partner Violence:   . Fear of Current or Ex-Partner:   . Emotionally Abused:   Marland Kitchen Physically Abused:   . Sexually Abused:      Family History:   Family  History  Problem Relation Age of Onset  . CAD Father   . Alzheimer's disease Other     ROS:  Review of Systems  Constitutional: Positive for diaphoresis and malaise/fatigue. Negative for chills, fever and weight loss.  HENT: Negative for congestion.   Eyes: Negative for discharge and redness.  Respiratory: Positive for shortness of breath. Negative for cough, sputum production and wheezing.   Cardiovascular: Positive for chest pain. Negative for palpitations, orthopnea, claudication, leg swelling and PND.  Gastrointestinal: Negative for abdominal pain, blood in stool, heartburn, melena, nausea and vomiting.  Musculoskeletal: Positive for back pain. Negative for falls and myalgias.  Skin: Negative for rash.  Neurological: Positive for weakness. Negative for dizziness, tingling, tremors, sensory change, speech change, focal weakness and loss of consciousness.  Endo/Heme/Allergies: Does not bruise/bleed easily.  Psychiatric/Behavioral: Negative for substance abuse. The patient is not nervous/anxious.   All other systems reviewed and are negative.     Physical Exam/Data:   Vitals:   06/06/19 0730 06/06/19 0745 06/06/19 0800 06/06/19 0830  BP: 110/72  95/60 (!) 114/93  Pulse: 81 82 85 81  Resp: (!) 23 19 19  (!) 21  Temp:      TempSrc:      SpO2: 96% 97% 97% 97%  Weight:      Height:       No intake or output data in the 24 hours ending 06/06/19 0933 Filed Weights   06/06/19 0537  Weight: 65.8 kg   Body mass index is 24.89 kg/m.   Physical Exam: General: Well developed, well nourished, in no acute distress. Head: Normocephalic, atraumatic, sclera non-icteric, no xanthomas, nares without discharge.  Neck: Negative for carotid bruits. JVD not elevated. Lungs: Clear bilaterally to auscultation without wheezes, rales, or rhonchi. Breathing is unlabored. Heart: RRR with S1 S2. No murmurs, rubs, or gallops appreciated. Abdomen: Soft, non-tender, non-distended with normoactive  bowel sounds. No hepatomegaly. No rebound/guarding. No obvious abdominal masses. Msk:  Strength and tone appear normal for age. Extremities: No clubbing or  cyanosis. No edema. Distal pedal pulses are 2+ and equal bilaterally. Neuro: Alert and oriented X 3. No facial asymmetry. No focal deficit. Moves all extremities spontaneously. Psych:  Responds to questions appropriately with a normal affect.   EKG:  The EKG was personally reviewed and demonstrates: NSR, 85 bpm, low voltage along the precordial leads, baseline wandering and artifact, no acute st/t changes Telemetry:  Telemetry was personally reviewed and demonstrates: SR  Weights: [employer]   06/06/19 0537  Weight: 65.8 kg    Relevant CV Studies:  2D echo 02/23/2019: 1. Left ventricular ejection fraction, by visual estimation, is 60 to  65%. The left ventricle has normal function. There is no left ventricular  hypertrophy.  2. The left ventricle has no regional wall motion abnormalities.  3. Left ventricular diastolic parameters are indeterminate.  4. Global right ventricle has normal systolic function.The right  ventricular size is normal. No increase in right ventricular wall  thickness.  5. Left atrial size was normal.  6. TR signal is inadequate for assessing pulmonary artery systolic  pressure.  __________  LHC 02/23/2019: Coronary dominance: Right  Left mainstem:   Large vessel that bifurcates into the LAD and left circumflex, no significant disease noted  Left anterior descending (LAD):   Large vessel that extends to the apical region, diagonal branch 2 of moderate size, no significant disease noted  Left circumflex (LCx):  Large vessel with distal OM branch , 95% proximal to mid LCX disease (likely culprit vessel)  Right coronary artery (RCA):  Right dominant vessel with PL and PDA, no significant disease noted  Left ventriculography:  no significant aortic valve stenosis  Final Conclusions:   Severe  single vessel disease, of the LCX Otherwise mild lumenal irregularities  Recommendations:  Discussed with interventional cardiology, Dr. Okey Dupre, Plan will be for PCI of the LCX __________  PCI 02/23/2019: Conclusions: 1. Severe single-vessel coronary artery disease with 95% stenosis of the mid LCx.  See diagnostic catheterization report by Dr. Mariah Milling for further details. 2. Successful PCI to the mid LCx using Resolute Onyx 2.75 x 15 mm drug-eluting stent (postdilated to 3.1 mm) with 0% residual stenosis and TIMI-3 flow.  Recommendations: 1. Dual antiplatelet therapy with aspirin and prasugrel for at least 12 months. 2. Aggressive secondary prevention.  Laboratory Data:  Chemistry Recent Labs  Lab 06/06/19 0537  NA 138  K 3.9  CL 109  CO2 21*  GLUCOSE 163*  BUN 26*  CREATININE 1.38*  CALCIUM 9.0  GFRNONAA 41*  GFRAA 48*  ANIONGAP 8    Recent Labs  Lab 06/06/19 0537  PROT 7.4  ALBUMIN 3.7  AST 14*  ALT 16  ALKPHOS 106  BILITOT 0.6   Hematology Recent Labs  Lab 06/06/19 0537  WBC 15.5*  RBC 4.09  HGB 12.5  HCT 36.9  MCV 90.2  MCH 30.6  MCHC 33.9  RDW 13.9  PLT 462*   Cardiac EnzymesNo results for input(s): TROPONINI in the last 168 hours. No results for input(s): TROPIPOC in the last 168 hours.  BNPNo results for input(s): BNP, PROBNP in the last 168 hours.  DDimer No results for input(s): DDIMER in the last 168 hours.  Radiology/Studies:  DG Chest Portable 1 View  Result Date: 06/06/2019 IMPRESSION: Negative portable chest. Electronically Signed   By: Odessa Fleming M.D.   On: 06/06/2019 06:27    Assessment and Plan:   1. CAD involving the native coronary arteries with unstable angina: -Currently, symptom free -HS-Tn negative x  2 with a non-acute EKG -Symptoms are similar to her prior MI -Plan for LHC today with Dr. Fletcher Anon -Heparin gtt -DAPT with ASA and Effient  -Statin intolerant  -NPO -She was lost to follow up after her MI in 02/2019, stress the  importance of cardiology follow up -Risks and benefits of cardiac catheterization have been discussed with the patient including risks of bleeding, bruising, infection, kidney damage, stroke, heart attack, urgent need for bypass, injury to a limb, and death. The patient understands these risks and is willing to proceed with the procedure. All questions have been answered and concerns listened to  2. CKD stage II-III: -Stable -Gentle IV hydration  -Monitor post cath  3. Anemia: -Improved  4. HTN: -Blood pressure  5. HLD: -Statin intolerance  -LDL of 178 in 02/2019 with goal being < 70 -Lipid pending -Zetia -As an outpatient, plan for PCSK-9 inhibitor   6. Chronic pain/anxiety: -Management per IM   For questions or updates, please contact [place] Please consult [email or web address] for contact info under Cardiology/STEMI.   Signed, Christell Faith, PA-C [place] Pager: [phone] 06/06/2019, 9:33 AM

## 2019-06-06 NOTE — ED Notes (Signed)
Orders from Admit for repeat EKG.  Repeat EKG completed and Admit MD informed.

## 2019-06-06 NOTE — ED Triage Notes (Signed)
Patient presents to Emergency Department via [facility] from home with complaints of CP since Sunday at 1730.  Pressure in chest radiating into back and jaw  Pt took 3 x 81 ASA and 1 SL nitro from EMS that alleviated pain to some degree  History of MI in Jan 2021

## 2019-06-07 ENCOUNTER — Encounter: Payer: Self-pay | Admitting: Internal Medicine

## 2019-06-07 DIAGNOSIS — Z20822 Contact with and (suspected) exposure to covid-19: Secondary | ICD-10-CM | POA: Diagnosis not present

## 2019-06-07 DIAGNOSIS — I2510 Atherosclerotic heart disease of native coronary artery without angina pectoris: Secondary | ICD-10-CM

## 2019-06-07 DIAGNOSIS — E7800 Pure hypercholesterolemia, unspecified: Secondary | ICD-10-CM | POA: Diagnosis not present

## 2019-06-07 DIAGNOSIS — I252 Old myocardial infarction: Secondary | ICD-10-CM | POA: Diagnosis not present

## 2019-06-07 DIAGNOSIS — Z8616 Personal history of COVID-19: Secondary | ICD-10-CM | POA: Diagnosis not present

## 2019-06-07 DIAGNOSIS — I25110 Atherosclerotic heart disease of native coronary artery with unstable angina pectoris: Secondary | ICD-10-CM | POA: Diagnosis not present

## 2019-06-07 DIAGNOSIS — Z79899 Other long term (current) drug therapy: Secondary | ICD-10-CM | POA: Diagnosis not present

## 2019-06-07 DIAGNOSIS — R079 Chest pain, unspecified: Secondary | ICD-10-CM

## 2019-06-07 DIAGNOSIS — F419 Anxiety disorder, unspecified: Secondary | ICD-10-CM | POA: Diagnosis not present

## 2019-06-07 DIAGNOSIS — D649 Anemia, unspecified: Secondary | ICD-10-CM | POA: Diagnosis not present

## 2019-06-07 DIAGNOSIS — G894 Chronic pain syndrome: Secondary | ICD-10-CM | POA: Diagnosis not present

## 2019-06-07 LAB — BASIC METABOLIC PANEL
Anion gap: 7 (ref 5–15)
BUN: 17 mg/dL (ref 8–23)
CO2: 21 mmol/L — ABNORMAL LOW (ref 22–32)
Calcium: 9.4 mg/dL (ref 8.9–10.3)
Chloride: 114 mmol/L — ABNORMAL HIGH (ref 98–111)
Creatinine, Ser: 1.15 mg/dL — ABNORMAL HIGH (ref 0.44–1.00)
GFR calc Af Amer: 59 mL/min — ABNORMAL LOW (ref >60)
GFR calc non Af Amer: 51 mL/min — ABNORMAL LOW (ref >60)
Glucose, Bld: 111 mg/dL — ABNORMAL HIGH (ref 70–99)
Potassium: 5 mmol/L (ref 3.5–5.1)
Sodium: 142 mmol/L (ref 135–145)

## 2019-06-07 LAB — CBC
HCT: 37.7 % (ref 36.0–46.0)
Hemoglobin: 12.1 g/dL (ref 12.0–15.0)
MCH: 29.4 pg (ref 26.0–34.0)
MCHC: 32.1 g/dL (ref 30.0–36.0)
MCV: 91.5 fL (ref 80.0–100.0)
Platelets: 439 10*3/uL — ABNORMAL HIGH (ref 150–400)
RBC: 4.12 MIL/uL (ref 3.87–5.11)
RDW: 14 % (ref 11.5–15.5)
WBC: 12.9 10*3/uL — ABNORMAL HIGH (ref 4.0–10.5)
nRBC: 0 % (ref 0.0–0.2)

## 2019-06-07 LAB — LIPID PANEL
Cholesterol: 290 mg/dL — ABNORMAL HIGH (ref 0–200)
HDL: 40 mg/dL — ABNORMAL LOW (ref >40)
LDL Cholesterol: 178 mg/dL — ABNORMAL HIGH (ref 0–99)
Total CHOL/HDL Ratio: 7.3 RATIO
Triglycerides: 360 mg/dL — ABNORMAL HIGH (ref <150)
VLDL: 72 mg/dL — ABNORMAL HIGH (ref 0–40)

## 2019-06-07 LAB — GLUCOSE, CAPILLARY: Glucose-Capillary: 124 mg/dL — ABNORMAL HIGH (ref 70–99)

## 2019-06-07 MED ORDER — POTASSIUM CHLORIDE CRYS ER 20 MEQ PO TBCR
20.0000 meq | EXTENDED_RELEASE_TABLET | Freq: Two times a day (BID) | ORAL | Status: DC
  Administered 2019-06-07: 20 meq via ORAL
  Filled 2019-06-07: qty 1

## 2019-06-07 MED ORDER — CARVEDILOL 3.125 MG PO TABS
3.1250 mg | ORAL_TABLET | Freq: Two times a day (BID) | ORAL | Status: DC
  Administered 2019-06-07: 3.125 mg via ORAL
  Filled 2019-06-07: qty 1

## 2019-06-07 MED ORDER — ASPIRIN 81 MG PO TBEC: 81.0000 mg | DELAYED_RELEASE_TABLET | Freq: Every day | ORAL | Status: AC

## 2019-06-07 MED ORDER — ROSUVASTATIN CALCIUM 10 MG PO TABS
10.0000 mg | ORAL_TABLET | Freq: Every day | ORAL | Status: DC
  Administered 2019-06-07: 10 mg via ORAL
  Filled 2019-06-07: qty 1

## 2019-06-07 MED ORDER — POTASSIUM CHLORIDE CRYS ER 20 MEQ PO TBCR: 20.0000 meq | EXTENDED_RELEASE_TABLET | Freq: Two times a day (BID) | ORAL | Status: DC

## 2019-06-07 NOTE — Progress Notes (Signed)
Progress Note  Patient Name: Whitney Bernard Date of Encounter: 06/07/2019  Primary Cardiologist: End  Subjective   No further chest, back, neck, shoulder, or jaw pain. No dyspnea or palpitations. Has ambulated without issues. LHC 06/06/2019 showed a widely patent LCx stent with no significant restenosis or evidence of obstructive CAD. Post cath labs stable.   Inpatient Medications    Scheduled Meds: . ALPRAZolam  0.5 mg Oral BID  . aspirin EC  81 mg Oral Daily  . busPIRone  10 mg Oral BID  . carisoprodol  350 mg Oral BID  . ezetimibe  10 mg Oral Daily  . ferrous sulfate  325 mg Oral BID WC  . furosemide  40 mg Oral BID  . insulin aspart  0-5 Units Subcutaneous QHS  . insulin aspart  0-9 Units Subcutaneous TID WC  .  morphine injection  1 mg Intravenous Once  . pantoprazole  40 mg Oral Daily  . PARoxetine  40 mg Oral Daily  . potassium chloride SA  20 mEq Oral TID  . prasugrel  10 mg Oral Daily  . rosuvastatin  10 mg Oral Daily  . sodium chloride flush  3 mL Intravenous Q12H   Continuous Infusions: . sodium chloride     PRN Meds: sodium chloride, acetaminophen, butalbital-acetaminophen-caffeine, nitroGLYCERIN, ondansetron (ZOFRAN) IV, sodium chloride flush   Vital Signs    Vitals:   06/06/19 1819 06/06/19 1950 06/06/19 2245 06/07/19 0419  BP: 123/69 130/73 (!) 142/78 (!) 141/74  Pulse: 81 83 79 68  Resp: 19  16   Temp: 98.4 F (36.9 C) 98.4 F (36.9 C) 98.2 F (36.8 C) 98.7 F (37.1 C)  TempSrc:  Oral  Oral  SpO2: 99% 100% 100% 100%  Weight: 68.8 kg   68.6 kg  Height: 5\' 4"  (1.626 m)       Intake/Output Summary (Last 24 hours) at 06/07/2019 0747 Last data filed at 06/07/2019 0454 Gross per 24 hour  Intake 995.48 ml  Output 900 ml  Net 95.48 ml   Filed Weights   06/06/19 1524 06/06/19 1819 06/07/19 0419  Weight: 65.8 kg 68.8 kg 68.6 kg    Telemetry    SR 60s to 70s bpm - Personally Reviewed  ECG    No new tracings - Personally Reviewed  Physical  Exam   GEN: No acute distress.   Neck: No JVD. Cardiac: RRR, no murmurs, rubs, or gallops. Right radial cath site is well healing, without bleeding, bruising, swelling, warmth, or erythema. Radial pulse 2+.  Respiratory: Clear to auscultation bilaterally.  GI: Soft, nontender, non-distended.   MS: No edema; No deformity. Neuro:  Alert and oriented x 3; Nonfocal.  Psych: Normal affect.  Labs    Chemistry Recent Labs  Lab 06/06/19 0537 06/07/19 0416  NA 138 142  K 3.9 5.0  CL 109 114*  CO2 21* 21*  GLUCOSE 163* 111*  BUN 26* 17  CREATININE 1.38* 1.15*  CALCIUM 9.0 9.4  PROT 7.4  --   ALBUMIN 3.7  --   AST 14*  --   ALT 16  --   ALKPHOS 106  --   BILITOT 0.6  --   GFRNONAA 41* 51*  GFRAA 48* 59*  ANIONGAP 8 7     Hematology Recent Labs  Lab 06/06/19 0537 06/07/19 0416  WBC 15.5* 12.9*  RBC 4.09 4.12  HGB 12.5 12.1  HCT 36.9 37.7  MCV 90.2 91.5  MCH 30.6 29.4  MCHC 33.9 32.1  RDW 13.9 14.0  PLT 462* 439*    Cardiac EnzymesNo results for input(s): TROPONINI in the last 168 hours. No results for input(s): TROPIPOC in the last 168 hours.   BNPNo results for input(s): BNP, PROBNP in the last 168 hours.   DDimer No results for input(s): DDIMER in the last 168 hours.   Radiology   DG Chest Portable 1 View  Result Date: 06/06/2019 IMPRESSION: Negative portable chest. Electronically Signed   By: Genevie Ann M.D.   On: 06/06/2019 06:27    Cardiac Studies   LHC 06/06/2019:  Previously placed Mid Cx drug eluting stent is widely patent.  Ost RCA to Prox RCA lesion is 30% stenosed.   1.  Widely patent left circumflex stent with no significant restenosis.  No evidence of obstructive coronary artery disease. 2.  Mildly elevated left ventricular end-diastolic pressure.  Left ventricular angiography was not performed due to chronic kidney disease.  Recommendations: Chest pain is likely noncardiac.  Stop nitroglycerin drip and heparin drip.  We can likely discharge  home tomorrow. Continue dual antiplatelet therapy and aggressive treatment of risk factors. __________  2D echo 02/23/2019: 1. Left ventricular ejection fraction, by visual estimation, is 60 to  65%. The left ventricle has normal function. There is no left ventricular  hypertrophy.  2. The left ventricle has no regional wall motion abnormalities.  3. Left ventricular diastolic parameters are indeterminate.  4. Global right ventricle has normal systolic function.The right  ventricular size is normal. No increase in right ventricular wall  thickness.  5. Left atrial size was normal.  6. TR signal is inadequate for assessing pulmonary artery systolic  pressure.  __________  LHC 02/23/2019: Coronary dominance: Right  Left mainstem: Large vessel that bifurcates into the LAD and left circumflex, no significant disease noted  Left anterior descending (LAD): Large vessel that extends to the apical region, diagonal branch 2 of moderate size, no significant disease noted  Left circumflex (LCx): Large vessel with distal OM branch , 95% proximal to mid LCX disease (likely culprit vessel)  Right coronary artery (RCA): Right dominant vessel with PL and PDA, no significant disease noted  Left ventriculography: no significant aortic valve stenosis  Final Conclusions:  Severe single vessel disease, of the LCX Otherwise mild lumenal irregularities  Recommendations:  Discussed with interventional cardiology, Dr. Saunders Revel, Plan will be for PCI of the LCX __________  PCI 02/23/2019: Conclusions: 1. Severe single-vessel coronary artery disease with 95% stenosis of the mid LCx. See diagnostic catheterization report by Dr. Rockey Situ for further details. 2. Successful PCI to the mid LCx using Resolute Onyx 2.75 x 15 mm drug-eluting stent (postdilated to 3.1 mm) with 0% residual stenosis and TIMI-3 flow.  Recommendations: 1. Dual antiplatelet therapy with aspirin and prasugrel for at least 12  months. 2. Aggressive secondary prevention.  Patient Profile     59 y.o. female with history of CAD with NSTEMI in 02/2019 s/p PCI/DES to the mid LCx, diabetes, hypertension, hyperlipidemia with statin intolerance, hypothyroidism, chronic pain disorder with narcotic use, CKD stage II-III,COVID-19 infection diagnosed in 10/2018,rheumatoid arthritis, migraine disorder, COPD secondary topriortobacco abuse, colitis, diverticulitis and depression who is being seen today for the evaluation of chest pain at the request of Dr. Francine Graven.  Assessment & Plan    1. CAD involving the native coronary arteries with unstable angina: -Currently, symptom free -HS-Tn negative x 2 with a non-acute EKG -LHC 06/06/2019, with a widely patent LCx stent with no evidence of restenosis or obstructive disease  otherwise -Continue DAPT with ASA and Effient without interruption through at least 02/2020  -Recommend she follow up with our office in 1-2 weeks (previously lost to follow up in 02/2019 following her MI) -Post cath instructions  2. CKD stage II-III: -Stable -Will decrease KCl from tid dosing given high-normal potassium  3. Anemia: -Improved and stable  4. HTN: -Blood pressure mildly elevated -Add Coreg 3.125 mg bid  5. HLD: -Statin intolerance noted, though she reports taking Crestor intermittently at home and agrees with being discharged on this medication  -LDL of 178 this admission with goal being < 70 -Crestor 10 mg -Zetia 10 mg -Recheck FLP and LFT in 8 weeks, if LDL remains above goal at that time, titrate Crestor, if tolerated  -As an outpatient, may need PCSK-9 inhibitor   6. Chronic pain/anxiety: -Management per IM   For questions or updates, please contact [facility] Please consult [email or web address] for contact info under Cardiology/STEMI.    Signed, Eula Listen, PA-C [facility] Pager: [phone] 06/07/2019, 7:47 AM

## 2019-06-07 NOTE — Discharge Summary (Signed)
Physician Discharge Summary  Whitney Bernard:[id number] DOB: 1960-04-07 DOA: 06/06/2019  PCP: Evelene Croon, MD  Admit date: 06/06/2019 Discharge date: 06/07/2019  Discharge disposition: Home   Recommendations for Outpatient Follow-Up:   Outpatient follow up with PCP in 1 week Outpatient follow-up with cardiologist, Dr. Kirke Corin, as scheduled 07/14/2019   Discharge Diagnosis:   Principal Problem:   Chest pain, unspecified Active Problems:   CKD (chronic kidney disease), stage IIIa   Type II diabetes mellitus with renal manifestations (HCC)   CAD (coronary artery disease)   Depression   Unstable angina (HCC)    Discharge Condition: Stable.  Diet recommendation: Heart healthy diet  Code status: Full code.    Hospital Course:   Whitney Bernard is a 59 y.o. female with medical history significant for coronary artery disease status post STEMI in January 2021, status post stent angioplasty to LCx, diabetes mellitus with complications of stage III chronic kidney disease, hypertension, rheumatoid arthritis and chronic pain syndrome who presented to the ER for evaluation of chest pain.  There was concern for acute coronary syndrome so she was admitted for observation.  She was seen in consultation by the cardiologist and she underwent left heart cath.  Left heart cath did not show any evidence of obstructive coronary artery disease.  Left circumflex stent was widely patent. Etiology of chest pain was not clear but chest pain has resolved.  She is deemed stable for discharge to home.      Discharge Exam:   Vitals:   06/07/19 0419 06/07/19 0754  BP: (!) 141/74 132/74  Pulse: 68 67  Resp:  16  Temp: 98.7 F (37.1 C) 98.4 F (36.9 C)  SpO2: 100% 98%   Vitals:   06/06/19 1950 06/06/19 2245 06/07/19 0419 06/07/19 0754  BP: 130/73 (!) 142/78 (!) 141/74 132/74  Pulse: 83 79 68 67  Resp:  16  16  Temp: 98.4 F (36.9 C) 98.2 F (36.8 C) 98.7 F (37.1 C) 98.4 F (36.9 C)    TempSrc: Oral  Oral Oral  SpO2: 100% 100% 100% 98%  Weight:   68.6 kg   Height:         GEN: NAD EYES: EOMI ENT: MMM CV: RRR PULM: CTA B ABD: soft, ND, NT, +BS CNS: AAO x 3, non focal EXT: No edema or tenderness   The results of significant diagnostics from this hospitalization (including imaging, microbiology, ancillary and laboratory) are listed below for reference.     Procedures and Diagnostic Studies:   CARDIAC CATHETERIZATION  Result Date: 06/06/2019  Previously placed Mid Cx drug eluting stent is widely patent.  Ost RCA to Prox RCA lesion is 30% stenosed.  1.  Widely patent left circumflex stent with no significant restenosis.  No evidence of obstructive coronary artery disease. 2.  Mildly elevated left ventricular end-diastolic pressure.  Left ventricular angiography was not performed due to chronic kidney disease. Recommendations: Chest pain is likely noncardiac.  Stop nitroglycerin drip and heparin drip.  We can likely discharge home tomorrow. Continue dual antiplatelet therapy and aggressive treatment of risk factors.   DG Chest Portable 1 View  Result Date: 06/06/2019 CLINICAL DATA:  59 year old female with chest pain since yesterday evening. Pressure radiating to the back and jaw. EXAM: PORTABLE CHEST 1 VIEW COMPARISON:  Chest radiographs 02/22/2019. CTA chest 02/06/2019. FINDINGS: Portable AP upright view at 0549 hours. Improved lung volumes since January. Mild eventration of the right hemidiaphragm, normal variant. Cardiac and mediastinal contours are stable  and within normal limits. Visualized tracheal air column is within normal limits. Allowing for portable technique the lungs are clear. Prior cervical ACDF. No acute osseous abnormality identified. Paucity of bowel gas in the upper abdomen. IMPRESSION: Negative portable chest. Electronically Signed   By: Genevie Ann M.D.   On: 06/06/2019 06:27     Labs:   Basic Metabolic Panel: Recent Labs  Lab 06/06/19 0537  06/07/19 0416  NA 138 142  K 3.9 5.0  CL 109 114*  CO2 21* 21*  GLUCOSE 163* 111*  BUN 26* 17  CREATININE 1.38* 1.15*  CALCIUM 9.0 9.4   GFR Estimated Creatinine Clearance: 48.9 mL/min (A) (by C-G formula based on SCr of 1.15 mg/dL (H)). Liver Function Tests: Recent Labs  Lab 06/06/19 0537  AST 14*  ALT 16  ALKPHOS 106  BILITOT 0.6  PROT 7.4  ALBUMIN 3.7   Recent Labs  Lab 06/06/19 0537  LIPASE 39   No results for input(s): AMMONIA in the last 168 hours. Coagulation profile Recent Labs  Lab 06/06/19 0537  INR 1.0    CBC: Recent Labs  Lab 06/06/19 0537 06/07/19 0416  WBC 15.5* 12.9*  NEUTROABS 10.6*  --   HGB 12.5 12.1  HCT 36.9 37.7  MCV 90.2 91.5  PLT 462* 439*   Cardiac Enzymes: No results for input(s): CKTOTAL, CKMB, CKMBINDEX, TROPONINI in the last 168 hours. BNP: Invalid input(s): POCBNP CBG: Recent Labs  Lab 06/06/19 1037 06/06/19 1207 06/06/19 2019 06/07/19 0755  GLUCAP 119* 121* 152* 124*   D-Dimer No results for input(s): DDIMER in the last 72 hours. Hgb A1c Recent Labs    06/06/19 0537  HGBA1C 6.3*   Lipid Profile Recent Labs    06/07/19 0416  CHOL 290*  HDL 40*  LDLCALC 178*  TRIG 360*  CHOLHDL 7.3   Thyroid function studies No results for input(s): TSH, T4TOTAL, T3FREE, THYROIDAB in the last 72 hours.  Invalid input(s): FREET3 Anemia work up No results for input(s): VITAMINB12, FOLATE, FERRITIN, TIBC, IRON, RETICCTPCT in the last 72 hours. Microbiology Recent Results (from the past 240 hour(s))  Respiratory Panel by RT PCR (Flu A&B, Covid) - Nasopharyngeal Swab     Status: None   Collection Time: 06/06/19  6:24 AM   Specimen: Nasopharyngeal Swab  Result Value Ref Range Status   SARS Coronavirus 2 by RT PCR NEGATIVE NEGATIVE Final    Comment: (NOTE) SARS-CoV-2 target nucleic acids are NOT DETECTED. The SARS-CoV-2 RNA is generally detectable in upper respiratoy specimens during the acute phase of infection. The  lowest concentration of SARS-CoV-2 viral copies this assay can detect is 131 copies/mL. A negative result does not preclude SARS-Cov-2 infection and should not be used as the sole basis for treatment or other patient management decisions. A negative result may occur with  improper specimen collection/handling, submission of specimen other than nasopharyngeal swab, presence of viral mutation(s) within the areas targeted by this assay, and inadequate number of viral copies (<131 copies/mL). A negative result must be combined with clinical observations, patient history, and epidemiological information. The expected result is Negative. Fact Sheet for Patients:  PinkCheek.be Fact Sheet for Healthcare Providers:  GravelBags.it This test is not yet ap proved or cleared by the [place] FDA and  has been authorized for detection and/or diagnosis of SARS-CoV-2 by FDA under an Emergency Use Authorization (EUA). This EUA will remain  in effect (meaning this test can be used) for the duration of the COVID-19 declaration under Section  564(b)(1) of the Act, 21 U.S.C. section 360bbb-3(b)(1), unless the authorization is terminated or revoked sooner.    Influenza A by PCR NEGATIVE NEGATIVE Final   Influenza B by PCR NEGATIVE NEGATIVE Final    Comment: (NOTE) The Xpert Xpress SARS-CoV-2/FLU/RSV assay is intended as an aid in  the diagnosis of influenza from Nasopharyngeal swab specimens and  should not be used as a sole basis for treatment. Nasal washings and  aspirates are unacceptable for Xpert Xpress SARS-CoV-2/FLU/RSV  testing. Fact Sheet for Patients: https://www.moore.com/ Fact Sheet for Healthcare Providers: https://www.young.biz/ This test is not yet approved or cleared by the [place] FDA and  has been authorized for detection and/or diagnosis of SARS-CoV-2 by  FDA under an Emergency  Use Authorization (EUA). This EUA will remain  in effect (meaning this test can be used) for the duration of the  Covid-19 declaration under Section 564(b)(1) of the Act, 21  U.S.C. section 360bbb-3(b)(1), unless the authorization is  terminated or revoked. Performed at [facility], [address] [place], [place] [address]      Discharge Instructions:   Discharge Instructions    Diet - low sodium heart healthy   Complete by: As directed    Diet Carb Modified   Complete by: As directed    Diet Heart   Complete by: As directed    Increase activity slowly   Complete by: As directed      Allergies as of 06/07/2019      Reactions   Almond Oil Hives   Atorvastatin Other (See Comments)   myalgias   Rosuvastatin       Medication List    TAKE these medications   ALPRAZolam 0.5 MG tablet Commonly known as: XANAX Take 0.5 mg by mouth 2 (two) times daily.   aspirin 81 MG EC tablet Take 1 tablet (81 mg total) by mouth daily.   busPIRone 10 MG tablet Commonly known as: BUSPAR Take 10 mg by mouth 2 (two) times daily.   Butalbital-APAP-Caffeine 50-300-40 MG Caps Take by mouth 3 (three) times daily as needed for headache.   carisoprodol 350 MG tablet Commonly known as: SOMA Take 350 mg by mouth 2 (two) times daily.   ezetimibe 10 MG tablet Commonly known as: ZETIA Take 1 tablet (10 mg total) by mouth daily.   ferrous sulfate 325 (65 FE) MG tablet Take 1 tablet (325 mg total) by mouth 2 (two) times daily with a meal.   furosemide 40 MG tablet Commonly known as: LASIX Take 40 mg by mouth 2 (two) times daily.   omeprazole 20 MG capsule Commonly known as: PRILOSEC Take 20 mg by mouth daily.   PARoxetine 40 MG tablet Commonly known as: PAXIL Take 40 mg by mouth daily.   potassium chloride SA 20 MEQ tablet Commonly known as: KLOR-CON Take 1 tablet (20 mEq total) by mouth 2 (two) times daily. What changed: when to take this   prasugrel 10 MG Tabs  tablet Commonly known as: EFFIENT Take 1 tablet (10 mg total) by mouth daily.   rosuvastatin 10 MG tablet Commonly known as: CRESTOR Take 1 tablet (10 mg total) by mouth daily at 6 PM.      Follow-up Information    Iran Ouch, MD. Go on 07/14/2019.   Specialty: Cardiology Why: APPOINTMENT AT 10:30AM Contact information: [address] [place] [address] [phone]            Time coordinating discharge: 31 minutes  Signed:  Lurene Shadow  [facility] 06/07/2019, 2:49 PM

## 2019-06-07 NOTE — Progress Notes (Signed)
Patient rang out requesting "something for diarrhea".  Reports "5 stools since arrival to floor".  Informed that nurse would need to  observe stools.  Collection contained placed in commode for further stools.

## 2019-06-07 NOTE — Progress Notes (Signed)
Small pasty brown stool noted in commode.  Patient to notify nurse with liquid stools.

## 2019-06-07 NOTE — Progress Notes (Signed)
Patient discharged to home. All instructions given and questions answered.

## 2019-06-08 NOTE — Progress Notes (Deleted)
Cardiology Office Note    Date:  06/08/2019   ID:  Keaun[place], Skipper 06-Sep-1960, MRN [id number]  PCP:  Lorelee Market, MD  Cardiologist:  Nelva Bush, MD  Electrophysiologist:  None   Chief Complaint: Hospital follow-up  History of Present Illness:   Whitney Bernard is a 59 y.o. female with history of CAD with NSTEMI in 02/2019 s/p PCI/DES to the mid LCx, diabetes, hypertension, hyperlipidemia with reported statin intolerance though tolerating Crestor, hypothyroidism, chronic pain disorder with narcotic use, CKD stage II-III,COVID-19 infection diagnosed in 10/2018,rheumatoid arthritis, migraine disorder, COPD secondary topriortobacco abuse, diverticulitis and depression who presents for hospital follow-up as detailed below.  She was admitted to the hospital in 02/2019 with a NSTEMI with HS-Tn peaking at 1667. She underwent LHC on 02/23/2019 that showed severe 1-vessel CAD with 95% mid LCx stenosis s/p PCI/DES. Otherwise, there was no obstructive disease. Echo showed an EF of 60-65%, no LVH, no RWMA, normal RVSF and cavity size. She was placed on ASA and Effient. Following her admission, she was lost to follow up.  She was admitted to [facility] on 06/05/2019 through 06/07/2019 with chest pain concerning for unstable angina.  She reported compliance with dual antiplatelet therapy.  High-sensitivity troponin negative x2 with EKG showing no ischemic changes.  Due to symptoms similar to her prior MI and concerning for unstable angina she underwent diagnostic LHC on 06/06/2019 which showed a widely patent LCx stent with no significant restenosis and no evidence of obstructive CAD with mildly elevated LVEDP.  LV gram was not performed secondary to CKD.  Her symptoms were felt to be noncardiac.  ***   Labs independently reviewed: 05/2019 - Hgb 12.1, PLT 439, potassium 5.0, BUN 17, serum creatinine 1.15, TC 290, TG 360, HDL 40, LDL 178, A1c 6.3, albumin 3.7, AST/ALT normal  Past Medical History:    Diagnosis Date  . Acute pancreatitis   . CAD (coronary artery disease)    a. 08/2017 Neg MV; b. 01/2019 CTA Chest: Mild cor Ca2+; c. 02/2019 NSTEMI/PCI: LM nl, LAD nl, LCX 95p/m (2.75x15 Resolute Onyx DES), RCA nl.  . Chronic pain syndrome   . CKD (chronic kidney disease), stage III   . COPD (chronic obstructive pulmonary disease) ([place])   . Depression   . Diabetes mellitus without complication ([place])   . Headache(784.0)    migraines  . History of echocardiogram    a. 04/2018 Echo: EF 55-60%. No significant valvular dzs; b. 02/2019 Echo: EF 60-65%. Nl RV fxn.  . Hypercholesteremia   . Hypertension   . Hyperthyroidism   . Hypokalemia   . Migraine   . Narcotic abuse ([place])   . RA (rheumatoid arthritis) ([place])     Past Surgical History:  Procedure Laterality Date  . ANTERIOR CERVICAL DECOMP/DISCECTOMY FUSION N/A 11/26/2012   Procedure: ANTERIOR CERVICAL DECOMPRESSION/DISCECTOMY FUSION 2 LEVELS;  Surgeon: Faythe Ghee, MD;  Location: [facility];  Service: Neurosurgery;  Laterality: N/A;  ANTERIOR CERVICAL DECOMPRESSION/DISCECTOMY FUSION 2 LEVELS  . CERVICAL POLYPECTOMY    . CHOLECYSTECTOMY    . CORONARY STENT INTERVENTION N/A 02/23/2019   Procedure: CORONARY STENT INTERVENTION;  Surgeon: Nelva Bush, MD;  Location: [facility];  Service: Cardiovascular;  Laterality: N/A;  . ESOPHAGOGASTRODUODENOSCOPY N/A 12/26/2014   Procedure: ESOPHAGOGASTRODUODENOSCOPY (EGD);  Surgeon: Lucilla Lame, MD;  Location[facility] END[facility];  Service: Endoscopy;  Laterality: N/A;  . FRACTURE SURGERY    . LEFT HEART CATH AND CORONARY ANGIOGRAPHY N/A 02/23/2019   Procedure: LEFT  HEART CATH AND CORONARY ANGIOGRAPHY;  Surgeon: Antonieta Iba, MD;  Location: [facility] INVASIVE CV LAB;  Service: Cardiovascular;  Laterality: N/A;  . LEFT HEART CATH AND CORONARY ANGIOGRAPHY N/A 06/06/2019   Procedure: LEFT HEART CATH AND CORONARY ANGIOGRAPHY;  Surgeon: Iran Ouch, MD;  Location: [facility] INVASIVE CV LAB;   Service: Cardiovascular;  Laterality: N/A;  . NECK SURGERY    . TUBAL LIGATION    . WISDOM TOOTH EXTRACTION      Current Medications: No outpatient medications have been marked as taking for the 06/14/19 encounter (Appointment) with Sondra Barges, PA-C.    Allergies:   Almond oil, Atorvastatin, and Rosuvastatin   Social History   Socioeconomic History  . Marital status: Married    Spouse name: Not on file  . Number of children: Not on file  . Years of education: Not on file  . Highest education level: Not on file  Occupational History  . Not on file  Tobacco Use  . Smoking status: Former Smoker    Packs/day: 0.50    Years: 38.00    Pack years: 19.00    Types: Cigarettes    Quit date: 11/14/2012    Years since quitting: 6.5  . Smokeless tobacco: Never Used  Substance and Sexual Activity  . Alcohol use: Not Currently    Comment: occ  . Drug use: No  . Sexual activity: Yes    Birth control/protection: Surgical  Other Topics Concern  . Not on file  Social History Narrative  . Not on file   Social Determinants of Health   Financial Resource Strain:   . Difficulty of Paying Living Expenses:   Food Insecurity:   . Worried About Programme researcher, broadcasting/film/video in the Last Year:   . Barista in the Last Year:   Transportation Needs:   . Freight forwarder (Medical):   Marland Kitchen Lack of Transportation (Non-Medical):   Physical Activity:   . Days of Exercise per Week:   . Minutes of Exercise per Session:   Stress:   . Feeling of Stress :   Social Connections:   . Frequency of Communication with Friends and Family:   . Frequency of Social Gatherings with Friends and Family:   . Attends Religious Services:   . Active Member of Clubs or Organizations:   . Attends Banker Meetings:   Marland Kitchen Marital Status:      Family History:  The patient's family history includes Alzheimer's disease in an other family member; CAD in her father.  ROS:   ROS   EKGs/Labs/Other  Studies Reviewed:    Studies reviewed were summarized above. The additional studies were reviewed today:  LHC 06/06/2019:  Previously placed Mid Cx drug eluting stent is widely patent.  Ost RCA to Prox RCA lesion is 30% stenosed.   1.  Widely patent left circumflex stent with no significant restenosis.  No evidence of obstructive coronary artery disease. 2.  Mildly elevated left ventricular end-diastolic pressure.  Left ventricular angiography was not performed due to chronic kidney disease.  Recommendations: Chest pain is likely noncardiac.  Stop nitroglycerin drip and heparin drip.  We can likely discharge home tomorrow. Continue dual antiplatelet therapy and aggressive treatment of risk factors.   EKG:  EKG is ordered today.  The EKG ordered today demonstrates ***  Recent Labs: 02/22/2019: B Natriuretic Peptide 83.0 02/25/2019: Magnesium 1.8 06/06/2019: ALT 16 06/07/2019: BUN 17; Creatinine, Ser 1.15; Hemoglobin 12.1; Platelets 439; Potassium 5.0;  Sodium 142  Recent Lipid Panel    Component Value Date/Time   CHOL 290 (H) 06/07/2019 0416   CHOL 211 (H) 03/13/2012 0459   TRIG 360 (H) 06/07/2019 0416   TRIG 358 (H) 03/13/2012 0459   HDL 40 (L) 06/07/2019 0416   HDL 36 (L) 03/13/2012 0459   CHOLHDL 7.3 06/07/2019 0416   VLDL 72 (H) 06/07/2019 0416   VLDL 72 (H) 03/13/2012 0459   LDLCALC 178 (H) 06/07/2019 0416   LDLCALC 103 (H) 03/13/2012 0459    PHYSICAL EXAM:    VS:  There were no vitals taken for this visit.  BMI: There is no height or weight on file to calculate BMI.  Physical Exam  Wt Readings from Last 3 Encounters:  06/07/19 151 lb 4.8 oz (68.6 kg)  02/25/19 159 lb 4.8 oz (72.3 kg)  02/06/19 140 lb (63.5 kg)     ASSESSMENT & PLAN:   1. ***  Disposition: F/u with Dr. Okey Dupre or an APP in ***.   Medication Adjustments/Labs and Tests Ordered: Current medicines are reviewed at length with the patient today.  Concerns regarding medicines are outlined above.  Medication changes, Labs and Tests ordered today are summarized above and listed in the Patient Instructions accessible in Encounters.   Signed, Eula Listen, PA-C 06/08/2019 10:16 AM     [facility] [place] [facility] [address] [place], [place] [address] [phone]

## 2019-06-14 ENCOUNTER — Ambulatory Visit: Payer: 59 | Admitting: Physician Assistant

## 2019-06-16 ENCOUNTER — Ambulatory Visit: Payer: 59 | Admitting: Nurse Practitioner

## 2019-06-17 ENCOUNTER — Ambulatory Visit: Payer: 59 | Admitting: Nurse Practitioner

## 2019-06-17 ENCOUNTER — Encounter: Payer: Self-pay | Admitting: Nurse Practitioner

## 2019-06-17 NOTE — Progress Notes (Deleted)
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  Past Medical History    Past Medical History:  Diagnosis Date  . Acute pancreatitis   . CAD (coronary artery disease)    a. 08/2017 Neg MV; b. 01/2019 CTA Chest: Mild cor Ca2+; c. 02/2019 NSTEMI/PCI: LM nl, LAD nl, LCX 95p/m (2.75x15 Resolute Onyx DES), RCA nl; d. 05/2018 Cath: LM nl, LAD nl, LCX patent stent, OM1 small, nl, RCA mild diff dzs, 30ost/p-->Med Rx.  . Chronic pain syndrome   . CKD (chronic kidney disease), stage III   . COPD (chronic obstructive pulmonary disease) (HCC)   . Depression   . Diabetes mellitus without complication (HCC)   . Headache(784.0)    migraines  . History of echocardiogram    a. 04/2018 Echo: EF 55-60%. No significant valvular dzs; b. 02/2019 Echo: EF 60-65%. Nl RV fxn.  . Hypercholesteremia   . Hypertension   . Hyperthyroidism   . Hypokalemia   . Migraine   . Narcotic abuse (HCC)   . RA (rheumatoid arthritis) (HCC)    Past Surgical History:  Procedure Laterality Date  . ANTERIOR CERVICAL DECOMP/DISCECTOMY FUSION N/A 11/26/2012   Procedure: ANTERIOR CERVICAL DECOMPRESSION/DISCECTOMY FUSION 2 LEVELS;  Surgeon: Reinaldo Meeker, MD;  Location: [facility];  Service: Neurosurgery;  Laterality: N/A;  ANTERIOR CERVICAL  DECOMPRESSION/DISCECTOMY FUSION 2 LEVELS  . CERVICAL POLYPECTOMY    . CHOLECYSTECTOMY    . CORONARY STENT INTERVENTION N/A 02/23/2019   Procedure: CORONARY STENT INTERVENTION;  Surgeon: Yvonne Kendall, MD;  Location: [facility];  Service: Cardiovascular;  Laterality: N/A;  . ESOPHAGOGASTRODUODENOSCOPY N/A 12/26/2014   Procedure: ESOPHAGOGASTRODUODENOSCOPY (EGD);  Surgeon: Midge Minium, MD;  Location: [facility];  Service: Endoscopy;  Laterality: N/A;  . FRACTURE SURGERY    . LEFT HEART CATH AND CORONARY ANGIOGRAPHY N/A 02/23/2019   Procedure: LEFT HEART CATH AND CORONARY ANGIOGRAPHY;  Surgeon: Antonieta Iba, MD;  Location: [facility];  Service: Cardiovascular;  Laterality: N/A;  . LEFT HEART CATH AND CORONARY ANGIOGRAPHY N/A 06/06/2019   Procedure: LEFT HEART CATH AND CORONARY ANGIOGRAPHY;  Surgeon: Iran Ouch, MD;  Location: [facility] CV [facility];  Service: Cardiovascular;  Laterality: N/A;  . NECK SURGERY    . TUBAL LIGATION    . WISDOM TOOTH EXTRACTION      Allergies  Allergies  Allergen Reactions  . Almond Oil Hives  . Atorvastatin Other (See Comments)    myalgias  . Rosuvastatin     History of Present Illness    59 year old female with the above complex past medical history including coronary artery disease, hypertension, hyperlipidemia, diabetes, statin intolerance, hypothyroidism, chronic pain with narcotic use, stage II-III chronic kidney disease, COVID-19 infection (10/2018), rheumatoid arthritis, migraine disorder, COPD, remote tobacco abuse, colitis, diverticulitis, and depression.  In January 2021, she was admitted with a non-STEMI with a high-sensitivity troponin peaking at 1667.  Diagnostic catheterization showed severe one-vessel  coronary artery disease involving a 95% stenosis in the mid left circumflex, which was successfully treated with a resolute Onyx drug-eluting stent.  Echo during admission showed normal LV function (60-65%).  She was  discharged on aspirin and Effient but missed follow-up appointments.  Unfortunately, she was readmitted for chest pain on April 19.  She ruled out.  Given symptoms, she underwent diagnostic catheterization which showed patent left circumflex stent and otherwise mild, nonobstructive RCA disease.  Low-dose carvedilol therapy was added to her regimen in the setting of elevated blood pressures.  Her LDL was elevated at 178 on admission and in the setting of prior statin intolerance, she was continued on low-dose Crestor and Zetia with plan for outpatient follow-up and possible PCSK9 inhibitor initiation if necessary.  She was discharged home on April 20.  Since her hospitalization,  Home Medications    Prior to Admission medications   Medication Sig Start Date End Date Taking? Authorizing Provider  ALPRAZolam Duanne Moron) 0.5 MG tablet Take 0.5 mg by mouth 2 (two) times daily.    [provider]  aspirin EC 81 MG EC tablet Take 1 tablet (81 mg total) by mouth daily. 06/07/19   Jennye Boroughs, MD  busPIRone (BUSPAR) 10 MG tablet Take 10 mg by mouth 2 (two) times daily.  04/26/18   [provider]  Butalbital-APAP-Caffeine 50-300-40 MG CAPS Take by mouth 3 (three) times daily as needed for headache.  04/26/18   [provider]  carisoprodol (SOMA) 350 MG tablet Take 350 mg by mouth 2 (two) times daily.    [provider]  ezetimibe (ZETIA) 10 MG tablet Take 1 tablet (10 mg total) by mouth daily. 02/26/19   Amin, Jeanella Flattery, MD  ferrous sulfate 325 (65 FE) MG tablet Take 1 tablet (325 mg total) by mouth 2 (two) times daily with a meal. 02/25/19 04/26/19  Amin, Jeanella Flattery, MD  furosemide (LASIX) 40 MG tablet Take 40 mg by mouth 2 (two) times daily.     [provider]  omeprazole (PRILOSEC) 20 MG capsule Take 20 mg by mouth daily.    [provider]  PARoxetine (PAXIL) 40 MG tablet Take 40 mg by mouth daily.    [provider]  potassium chloride SA  (KLOR-CON) 20 MEQ tablet Take 1 tablet (20 mEq total) by mouth 2 (two) times daily. 06/07/19   Jennye Boroughs, MD  prasugrel (EFFIENT) 10 MG TABS tablet Take 1 tablet (10 mg total) by mouth daily. 02/26/19   Amin, Jeanella Flattery, MD  rosuvastatin (CRESTOR) 10 MG tablet Take 1 tablet (10 mg total) by mouth daily at 6 PM. Patient not taking: Reported on 06/06/2019 02/25/19   Damita Lack, MD    Review of Systems    ***.  All other systems reviewed and are otherwise negative except as noted above.  Physical Exam    VS:  There were no vitals taken for this visit. , BMI There is no height or weight on file to calculate BMI. GEN: Well nourished, well developed, in no acute distress. HEENT: normal. Neck: Supple, no JVD, carotid bruits, or masses. Cardiac: RRR, no murmurs, rubs, or gallops. No clubbing, cyanosis, edema.  Radials/DP/PT 2+ and equal bilaterally.  Respiratory:  Respirations regular and unlabored, clear to auscultation bilaterally. GI: Soft, nontender, nondistended, BS + x 4. MS: no deformity or atrophy. Skin: warm and dry, no rash. Neuro:  Strength and sensation are intact. Psych: Normal affect.  Accessory Clinical Findings    ECG  personally reviewed by me today - *** - no acute changes.  Lab Results  Component Value Date   WBC 12.9 (H) 06/07/2019   HGB 12.1 06/07/2019   HCT 37.7 06/07/2019   MCV 91.5 06/07/2019   PLT 439 (H) 06/07/2019   Lab Results  Component Value Date   CREATININE 1.15 (H) 06/07/2019   BUN 17 06/07/2019   NA 142 06/07/2019   K 5.0 06/07/2019   CL 114 (H) 06/07/2019   CO2 21 (L) 06/07/2019   Lab Results  Component Value Date   ALT 16 06/06/2019   AST 14 (L) 06/06/2019   ALKPHOS 106 06/06/2019   BILITOT 0.6 06/06/2019   Lab Results  Component Value Date   CHOL 290 (H) 06/07/2019   HDL 40 (L) 06/07/2019   LDLCALC 178 (H) 06/07/2019   TRIG 360 (H) 06/07/2019   CHOLHDL 7.3 06/07/2019    Lab Results  Component Value Date   HGBA1C 6.3  (H) 06/06/2019    Assessment & Plan    1.  ***   Nicolasa Ducking, NP 06/17/2019, 9:56 AM

## 2019-06-21 ENCOUNTER — Ambulatory Visit: Payer: 59 | Admitting: Family

## 2019-06-21 NOTE — Progress Notes (Deleted)
Office Visit    Patient Name: Whitney Bernard Date of Encounter: 06/21/2019  Primary Care Provider:  Evelene Croon, MD Primary Cardiologist:  Yvonne Kendall, MD Electrophysiologist:  None   Chief Complaint    Whitney Bernard is a 59 y.o. female with a hx of CAD, DM2, HTN, HLD with statin intolerance, hypothyroidism, chronic pain disorder with narcotic use, CKD II-III, COVID 19 10/2018, RA, migraine disorder, COPD due to previous tobacco abuse, colitis, diverticulitis, depression presents today for follow up after cardiac cath.    Past Medical History    Past Medical History:  Diagnosis Date  . Acute pancreatitis   . CAD (coronary artery disease)    a. 08/2017 Neg MV; b. 01/2019 CTA Chest: Mild cor Ca2+; c. 02/2019 NSTEMI/PCI: LM nl, LAD nl, LCX 95p/m (2.75x15 Resolute Onyx DES), RCA nl; d. 05/2018 Cath: LM nl, LAD nl, LCX patent stent, OM1 small, nl, RCA mild diff dzs, 30ost/p-->Med Rx.  . Chronic pain syndrome   . CKD (chronic kidney disease), stage III   . COPD (chronic obstructive pulmonary disease) (HCC)   . Depression   . Diabetes mellitus without complication (HCC)   . Headache(784.0)    migraines  . History of echocardiogram    a. 04/2018 Echo: EF 55-60%. No significant valvular dzs; b. 02/2019 Echo: EF 60-65%. Nl RV fxn.  . Hypercholesteremia   . Hypertension   . Hyperthyroidism   . Hypokalemia   . Migraine   . Narcotic abuse (HCC)   . RA (rheumatoid arthritis) (HCC)    Past Surgical History:  Procedure Laterality Date  . ANTERIOR CERVICAL DECOMP/DISCECTOMY FUSION N/A 11/26/2012   Procedure: ANTERIOR CERVICAL DECOMPRESSION/DISCECTOMY FUSION 2 LEVELS;  Surgeon: Reinaldo Meeker, MD;  Location: [facility];  Service: Neurosurgery;  Laterality: N/A;  ANTERIOR CERVICAL DECOMPRESSION/DISCECTOMY FUSION 2 LEVELS  . CERVICAL POLYPECTOMY    . CHOLECYSTECTOMY    . CORONARY STENT INTERVENTION N/A 02/23/2019   Procedure: CORONARY STENT INTERVENTION;  Surgeon: Yvonne Kendall, MD;   Location: [facility];  Service: Cardiovascular;  Laterality: N/A;  . ESOPHAGOGASTRODUODENOSCOPY N/A 12/26/2014   Procedure: ESOPHAGOGASTRODUODENOSCOPY (EGD);  Surgeon: Midge Minium, MD;  Location: [facility] ENDOSCOPY;  Service: Endoscopy;  Laterality: N/A;  . FRACTURE SURGERY    . LEFT HEART CATH AND CORONARY ANGIOGRAPHY N/A 02/23/2019   Procedure: LEFT HEART CATH AND CORONARY ANGIOGRAPHY;  Surgeon: Antonieta Iba, MD;  Location: [facility];  Service: Cardiovascular;  Laterality: N/A;  . LEFT HEART CATH AND CORONARY ANGIOGRAPHY N/A 06/06/2019   Procedure: LEFT HEART CATH AND CORONARY ANGIOGRAPHY;  Surgeon: Iran Ouch, MD;  Location: [facility];  Service: Cardiovascular;  Laterality: N/A;  . NECK SURGERY    . TUBAL LIGATION    . WISDOM TOOTH EXTRACTION      Allergies  Allergies  Allergen Reactions  . Almond Oil Hives  . Atorvastatin Other (See Comments)    myalgias  . Rosuvastatin     History of Present Illness    Whitney Bernard is a 59 y.o. female with a hx of AD, DM2, HTN, HLD with statin intolerance, hypothyroidism, chronic pain disorder with narcotic use, CKD II-III, COVID 19 10/2018, RA, migraine disorder, COPD due to previous tobacco abuse, colitis, diverticulitis, depression. She was last seen while hospitalized.  Admitted 02/2019 with NSTEMI with HS-Tn peak at 1667. Underwent LHC 02/23/19 severe 1-vessel CAD with 95% mid LCx stenosis s/p PCI/DES. Echo with LVEF 60-65%, no LVh, no RWMA,  normal RVSF and cavity size. Placed on Aspirin and Effient. Following that admission she did stop smoking, but was lost to follow up.   Presented to [facility] with substernal chest pressure which felt similar to previous MI. EKG not acute, HS-TN 4 with unchanged delta, CXR no acute findings. Recommended for cardiac catheterization as her symptoms were similar to prior MI. LHC 06/06/19 patent stent to mid LCx, ost-prox RCA 30% stenosis. Chest pain presumed noncardiac. Recommended for  continued DAPT and aggressive treatment of risk factors.   EKGs/Labs/Other Studies Reviewed:   The following studies were reviewed today:  Echo 06/06/19  Previously placed Mid Cx drug eluting stent is widely patent.  Ost RCA to Prox RCA lesion is 30% stenosed.   1.  Widely patent left circumflex stent with no significant restenosis.  No evidence of obstructive coronary artery disease. 2.  Mildly elevated left ventricular end-diastolic pressure.  Left ventricular angiography was not performed due to chronic kidney disease.   Recommendations: Chest pain is likely noncardiac.  Stop nitroglycerin drip and heparin drip.  We can likely discharge home tomorrow. Continue dual antiplatelet therapy and aggressive treatment of risk factors.  2D echo 02/23/2019: 1. Left ventricular ejection fraction, by visual estimation, is 60 to  65%. The left ventricle has normal function. There is no left ventricular  hypertrophy.   2. The left ventricle has no regional wall motion abnormalities.   3. Left ventricular diastolic parameters are indeterminate.   4. Global right ventricle has normal systolic function.The right  ventricular size is normal. No increase in right ventricular wall  thickness.   5. Left atrial size was normal.   6. TR signal is inadequate for assessing pulmonary artery systolic  pressure.  __________   LHC 02/23/2019: Coronary dominance: Right  Left mainstem:   Large vessel that bifurcates into the LAD and left circumflex, no significant disease noted  Left anterior descending (LAD):   Large vessel that extends to the apical region, diagonal branch 2 of moderate size, no significant disease noted  Left circumflex (LCx):  Large vessel with distal OM branch , 95% proximal to mid LCX disease (likely culprit vessel)  Right coronary artery (RCA):  Right dominant vessel with PL and PDA, no significant disease noted  Left ventriculography:  no significant aortic valve  stenosis  Final Conclusions:   Severe single vessel disease, of the LCX Otherwise mild lumenal irregularities  Recommendations:  Discussed with interventional cardiology, Dr. Saunders Revel, Plan will be for PCI of the LCX __________   PCI 02/23/2019: Conclusions: 1. Severe single-vessel coronary artery disease with 95% stenosis of the mid LCx.  See diagnostic catheterization report by Dr. Rockey Situ for further details. 2. Successful PCI to the mid LCx using Resolute Onyx 2.75 x 15 mm drug-eluting stent (postdilated to 3.1 mm) with 0% residual stenosis and TIMI-3 flow.   Recommendations: 1. Dual antiplatelet therapy with aspirin and prasugrel for at least 12 months. 2. Aggressive secondary prevention.   EKG:  EKG is ordered today.  The ekg ordered today demonstrates ***  Recent Labs: 02/22/2019: B Natriuretic Peptide 83.0 02/25/2019: Magnesium 1.8 06/06/2019: ALT 16 06/07/2019: BUN 17; Creatinine, Ser 1.15; Hemoglobin 12.1; Platelets 439; Potassium 5.0; Sodium 142  Recent Lipid Panel    Component Value Date/Time   CHOL 290 (H) 06/07/2019 0416   CHOL 211 (H) 03/13/2012 0459   TRIG 360 (H) 06/07/2019 0416   TRIG 358 (H) 03/13/2012 0459   HDL 40 (L) 06/07/2019 0416   HDL 36 (L) 03/13/2012  0459   CHOLHDL 7.3 06/07/2019 0416   VLDL 72 (H) 06/07/2019 0416   VLDL 72 (H) 03/13/2012 0459   LDLCALC 178 (H) 06/07/2019 0416   LDLCALC 103 (H) 03/13/2012 0459    Home Medications   No outpatient medications have been marked as taking for the 06/21/19 encounter (Appointment) with Alver Sorrow, NP.      Review of Systems    ***   ROS All other systems reviewed and are otherwise negative except as noted above.  Physical Exam    VS:  There were no vitals taken for this visit. , BMI There is no height or weight on file to calculate BMI. GEN: Well nourished, well developed, in no acute distress. HEENT: normal. Neck: Supple, no JVD, carotid bruits, or masses. Cardiac: ***RRR, no murmurs, rubs,  or gallops. No clubbing, cyanosis, edema.  ***Radials/DP/PT 2+ and equal bilaterally.  Respiratory:  ***Respirations regular and unlabored, clear to auscultation bilaterally. GI: Soft, nontender, nondistended, BS + x 4. MS: No deformity or atrophy. Skin: Warm and dry, no rash. Neuro:  Strength and sensation are intact. Psych: Normal affect.  Accessory Clinical Findings    ECG personally reviewed by me today - *** - no acute changes.  Assessment & Plan    1. CAD - s/p PCI/DES LCx 02/2019 and LHC 05/2019 for recurrent chest pain with patent stent and 30% ost-mid RCA stenosis.  2. HLD, LDL goal <70 - History of intolerance to statin (Atorvastatin). 06/07/19 total cholesterol 290, HDL 40, LDL 178, triglycerides 360.  3. Tobacco abuse -   Disposition: Follow up {follow up:15908} with ***   Alver Sorrow, NP 06/21/2019, 1:07 PM

## 2019-06-22 ENCOUNTER — Encounter: Payer: Self-pay | Admitting: Family

## 2019-08-31 ENCOUNTER — Emergency Department
Admission: EM | Admit: 2019-08-31 | Discharge: 2019-08-31 | Disposition: A | Discharge status: Home / Self Care | Payer: 59 | Attending: Emergency Medicine | Admitting: Emergency Medicine

## 2019-08-31 ENCOUNTER — Telehealth: Payer: Self-pay | Admitting: Emergency Medicine

## 2019-08-31 ENCOUNTER — Other Ambulatory Visit: Payer: Self-pay

## 2019-08-31 ENCOUNTER — Emergency Department: Payer: 59

## 2019-08-31 DIAGNOSIS — R0789 Other chest pain: Secondary | ICD-10-CM | POA: Insufficient documentation

## 2019-08-31 DIAGNOSIS — Z5321 Procedure and treatment not carried out due to patient leaving prior to being seen by health care provider: Secondary | ICD-10-CM | POA: Insufficient documentation

## 2019-08-31 DIAGNOSIS — R0602 Shortness of breath: Secondary | ICD-10-CM | POA: Insufficient documentation

## 2019-08-31 DIAGNOSIS — R079 Chest pain, unspecified: Secondary | ICD-10-CM | POA: Diagnosis not present

## 2019-08-31 LAB — URINALYSIS, COMPLETE (UACMP) WITH MICROSCOPIC
Bacteria, UA: NONE SEEN
Bilirubin Urine: NEGATIVE
Glucose, UA: NEGATIVE mg/dL
Hgb urine dipstick: NEGATIVE
Ketones, ur: NEGATIVE mg/dL
Leukocytes,Ua: NEGATIVE
Nitrite: NEGATIVE
Protein, ur: NEGATIVE mg/dL
Specific Gravity, Urine: 1.008 (ref 1.005–1.030)
pH: 6 (ref 5.0–8.0)

## 2019-08-31 LAB — CBC
HCT: 31 % — ABNORMAL LOW (ref 36.0–46.0)
Hemoglobin: 9 g/dL — ABNORMAL LOW (ref 12.0–15.0)
MCH: 22.2 pg — ABNORMAL LOW (ref 26.0–34.0)
MCHC: 29 g/dL — ABNORMAL LOW (ref 30.0–36.0)
MCV: 76.5 fL — ABNORMAL LOW (ref 80.0–100.0)
Platelets: 490 10*3/uL — ABNORMAL HIGH (ref 150–400)
RBC: 4.05 MIL/uL (ref 3.87–5.11)
RDW: 17 % — ABNORMAL HIGH (ref 11.5–15.5)
WBC: 10.3 10*3/uL (ref 4.0–10.5)
nRBC: 0 % (ref 0.0–0.2)

## 2019-08-31 LAB — BASIC METABOLIC PANEL
Anion gap: 9 (ref 5–15)
BUN: 14 mg/dL (ref 8–23)
CO2: 25 mmol/L (ref 22–32)
Calcium: 9.3 mg/dL (ref 8.9–10.3)
Chloride: 105 mmol/L (ref 98–111)
Creatinine, Ser: 1.26 mg/dL — ABNORMAL HIGH (ref 0.44–1.00)
GFR calc Af Amer: 53 mL/min — ABNORMAL LOW (ref >60)
GFR calc non Af Amer: 46 mL/min — ABNORMAL LOW (ref >60)
Glucose, Bld: 146 mg/dL — ABNORMAL HIGH (ref 70–99)
Potassium: 4 mmol/L (ref 3.5–5.1)
Sodium: 139 mmol/L (ref 135–145)

## 2019-08-31 LAB — TROPONIN I (HIGH SENSITIVITY): Troponin I (High Sensitivity): 6 ng/L (ref <18)

## 2019-08-31 NOTE — ED Triage Notes (Signed)
Pt called x's 3 from [facility] to treatment room, no response °

## 2019-08-31 NOTE — ED Notes (Signed)
Pt called x's 3 from [facility] to treatment room, no response °

## 2019-08-31 NOTE — ED Triage Notes (Signed)
Pt update in the [facility]. Pt upset at wait time. RN apologetic to pt.

## 2019-08-31 NOTE — Telephone Encounter (Signed)
Called patient due to lwot to inquire about condition and follow up plans. She has not called her doctor yet.  Encouraged to call her doctor and let them know the symptoms she had and that labs/xray are available for their review.

## 2019-08-31 NOTE — ED Triage Notes (Signed)
Pt arrives to ED via POV from home with c/o chest pain and SHOB x3 hrs. Pt reports chest pain is left-sided with radiation into the upper back. No N/V or diaphoresis. Pt also reports SHOB that started at the same time as the chest pain. Pt has an extensive cardiac h/x, including an MI. Pt is A&O, in NAD; RR even, regular, and unlabored.

## 2019-10-11 ENCOUNTER — Other Ambulatory Visit: Payer: Self-pay | Admitting: Family Medicine

## 2019-10-11 DIAGNOSIS — E119 Type 2 diabetes mellitus without complications: Secondary | ICD-10-CM | POA: Diagnosis not present

## 2019-10-11 DIAGNOSIS — L409 Psoriasis, unspecified: Secondary | ICD-10-CM | POA: Diagnosis not present

## 2019-10-11 DIAGNOSIS — E7800 Pure hypercholesterolemia, unspecified: Secondary | ICD-10-CM | POA: Diagnosis not present

## 2019-10-11 DIAGNOSIS — F419 Anxiety disorder, unspecified: Secondary | ICD-10-CM | POA: Diagnosis not present

## 2019-10-11 DIAGNOSIS — I2510 Atherosclerotic heart disease of native coronary artery without angina pectoris: Secondary | ICD-10-CM | POA: Diagnosis not present

## 2019-10-19 ENCOUNTER — Ambulatory Visit (INDEPENDENT_AMBULATORY_CARE_PROVIDER_SITE_OTHER): Payer: 59 | Admitting: Nurse Practitioner

## 2019-10-19 ENCOUNTER — Encounter: Payer: Self-pay | Admitting: Nurse Practitioner

## 2019-10-19 ENCOUNTER — Other Ambulatory Visit: Payer: Self-pay

## 2019-10-19 VITALS — BP 180/84 | HR 68 | Ht 64.0 in | Wt 157.0 lb

## 2019-10-19 DIAGNOSIS — E785 Hyperlipidemia, unspecified: Secondary | ICD-10-CM | POA: Diagnosis not present

## 2019-10-19 DIAGNOSIS — D509 Iron deficiency anemia, unspecified: Secondary | ICD-10-CM | POA: Diagnosis not present

## 2019-10-19 DIAGNOSIS — I2510 Atherosclerotic heart disease of native coronary artery without angina pectoris: Secondary | ICD-10-CM | POA: Diagnosis not present

## 2019-10-19 DIAGNOSIS — N183 Chronic kidney disease, stage 3 unspecified: Secondary | ICD-10-CM | POA: Diagnosis not present

## 2019-10-19 DIAGNOSIS — I10 Essential (primary) hypertension: Secondary | ICD-10-CM

## 2019-10-19 MED ORDER — CLOPIDOGREL BISULFATE 75 MG PO TABS
75.0000 mg | ORAL_TABLET | Freq: Every day | ORAL | 3 refills | Status: DC
Start: 2019-10-19 — End: 2020-03-29

## 2019-10-19 MED ORDER — CARVEDILOL 3.125 MG PO TABS
3.1250 mg | ORAL_TABLET | Freq: Two times a day (BID) | ORAL | 6 refills | Status: DC
Start: 2019-10-19 — End: 2021-06-19

## 2019-10-19 NOTE — Patient Instructions (Signed)
Medication Instructions:  1- START Coreg Take 1 tablet (3.125 mg total) by mouth 2 (two) times daily 2- START Plavix Take 1 tablet (75 mg total) by mouth daily *If you need a refill on your cardiac medications before your next appointment, please call your pharmacy*   Lab Work: 1- I will call you tomorrow about stool cards 2- Your physician recommends that you return for lab work in: 1 weeks at the [facility] mall. (iron panel, cbc, bmet) No appt is needed. Hours are M-F 7AM- 6 PM.  If you have labs (blood work) drawn today and your tests are completely normal, you will receive your results only by: Marland Kitchen MyChart Message (if you have MyChart) OR . A paper copy in the mail If you have any lab test that is abnormal or we need to change your treatment, we will call you to review the results.   Testing/Procedures: None ordered   Follow-Up: At [facility], you and your health needs are our priority.  As part of our continuing mission to provide you with exceptional heart care, we have created designated Provider Care Teams.  These Care Teams include your primary Cardiologist (physician) and Advanced Practice Providers (APPs -  Physician Assistants and Nurse Practitioners) who all work together to provide you with the care you need, when you need it.  We recommend signing up for the patient portal called "MyChart".  Sign up information is provided on this After Visit Summary.  MyChart is used to connect with patients for Virtual Visits (Telemedicine).  Patients are able to view lab/test results, encounter notes, upcoming appointments, etc.  Non-urgent messages can be sent to your provider as well.   To learn more about what you can do with MyChart, go to [email or web address].    Your next appointment:   1 month(s)  The format for your next appointment:   In Person  Provider:    You may see Yvonne Kendall, MD or Nicolasa Ducking,

## 2019-10-19 NOTE — Progress Notes (Signed)
Office Visit    Patient Name: Whitney Bernard Date of Encounter: 10/19/2019  Primary Care Provider:  Evelene Croon, MD Primary Cardiologist:  Yvonne Kendall, MD  Chief Complaint    59 year old female with a history of CAD status post non-STEMI and circumflex stenting in January 2021, diabetes, hypertension, hyperlipidemia with statin intolerance, hypothyroidism, chronic pain disorder with narcotic use, CKD 2-3, COVID-19 infection (10/2018), rheumatoid arthritis, migraine disorder, COPD secondary tobacco abuse, colitis, diverticulitis, and depression, who presents for follow-up of coronary artery disease.  Past Medical History    Past Medical History:  Diagnosis Date  . Acute pancreatitis   . CAD (coronary artery disease)    a. 08/2017 Neg MV; b. 01/2019 CTA Chest: Mild cor Ca2+; c. 02/2019 NSTEMI/PCI: LCX 95p/m (2.75x15 Resolute Onyx DES); d. 05/2019 Cath: LM nl, LAD nl, LCX patent stent, RCA 30ost/p-->Med rx.  . Chronic pain syndrome   . CKD (chronic kidney disease), stage III   . COPD (chronic obstructive pulmonary disease) (HCC)   . Depression   . Diabetes mellitus without complication (HCC)   . Headache(784.0)    migraines  . History of echocardiogram    a. 04/2018 Echo: EF 55-60%. No significant valvular dzs; b. 02/2019 Echo: EF 60-65%. Nl RV fxn.  . Hypercholesteremia   . Hypertension   . Hyperthyroidism   . Hypokalemia   . Migraine   . Narcotic abuse (HCC)   . RA (rheumatoid arthritis) (HCC)    Past Surgical History:  Procedure Laterality Date  . ANTERIOR CERVICAL DECOMP/DISCECTOMY FUSION N/A 11/26/2012   Procedure: ANTERIOR CERVICAL DECOMPRESSION/DISCECTOMY FUSION 2 LEVELS;  Surgeon: Reinaldo Meeker, MD;  Location: [facility];  Service: Neurosurgery;  Laterality: N/A;  ANTERIOR CERVICAL DECOMPRESSION/DISCECTOMY FUSION 2 LEVELS  . CERVICAL POLYPECTOMY    . CHOLECYSTECTOMY    . CORONARY STENT INTERVENTION N/A 02/23/2019   Procedure: CORONARY STENT INTERVENTION;   Surgeon: Yvonne Kendall, MD;  Location: [facility] INVASIVE CV LAB;  Service: Cardiovascular;  Laterality: N/A;  . ESOPHAGOGASTRODUODENOSCOPY N/A 12/26/2014   Procedure: ESOPHAGOGASTRODUODENOSCOPY (EGD);  Surgeon: Midge Minium, MD;  Location: [facility] ENDOSCOPY;  Service: Endoscopy;  Laterality: N/A;  . FRACTURE SURGERY    . LEFT HEART CATH AND CORONARY ANGIOGRAPHY N/A 02/23/2019   Procedure: LEFT HEART CATH AND CORONARY ANGIOGRAPHY;  Surgeon: Antonieta Iba, MD;  Location: [facility] INVASIVE CV LAB;  Service: Cardiovascular;  Laterality: N/A;  . LEFT HEART CATH AND CORONARY ANGIOGRAPHY N/A 06/06/2019   Procedure: LEFT HEART CATH AND CORONARY ANGIOGRAPHY;  Surgeon: Iran Ouch, MD;  Location: [facility] INVASIVE CV LAB;  Service: Cardiovascular;  Laterality: N/A;  . NECK SURGERY    . TUBAL LIGATION    . WISDOM TOOTH EXTRACTION      Allergies  Allergies  Allergen Reactions  . Almond Oil Hives  . Atorvastatin Other (See Comments)    myalgias  . Rosuvastatin     History of Present Illness    59 year old female with a history of CAD status post non-STEMI and circumflex stenting in January 2021, diabetes, hypertension, hyperlipidemia with statin intolerance, hypothyroidism, chronic pain disorder with narcotic use, CKD 2-3, COVID-19 infection (10/2018), rheumatoid arthritis, migraine disorder, COPD secondary tobacco abuse, colitis, diverticulitis, and depression.  As noted, she was admitted to the hospital in January 2021 with a non-STEMI with a high sensitive troponin peaking at 1667.  Diagnostic catheterization revealed severe one-vessel CAD with a 95% stenosis of the mid left circumflex, which was successfully treated with a drug-eluting stent.  EF  was 60-65% by echocardiogram.  Post hospitalization, she did not follow-up.  In April of this year, she returned to the hospital with chest, neck, jaw, and shoulder pain associate with dyspnea and diaphoresis.  High-sensitivity troponin was normal.  Given history  and symptoms, she underwent diagnostic catheterization which revealed patent circumflex stents with a 30% stenosis in the ostial/proximal RCA.  Medical therapy was recommended.  She was placed on low-dose rosuvastatin therapy at discharge with plan for outpatient follow-up.  She missed her late April follow-up appointment.  Since her last hospitalization, she notes what she describes as chronic fatigue and just low energy.  She was seen in the emergency department on July 14, for complaint of chest pain and shortness of breath.  ECG was unremarkable.  Troponin was normal.  She was noted to have microcytic anemia with a hemoglobin of 9.0, hematocrit of 31.  She ended up leaving due to the extended wait time.  Since then, she denies chest pain, dyspnea, palpitations, PND, orthopnea, dizziness, syncope, edema, or early satiety.  She recently followed up with her primary care provider (August 24) and H&H was relatively stable at 8.6 and 31.  Creatinine was elevated above prior baseline at 1.62 (was 1.26 July 14).  LDL was 134.  She notes that a few weeks ago, she stopped taking Effient because she ran out.  She had been on rosuvastatin therapy but discontinued secondary to recurrent myalgias.  She reports compliance with her Zetia and aspirin.  She has not smoked since January.  She denies any melena but on occasion has noted small amounts of red blood on toilet paper, which she attributed to hemorrhoids.  Home Medications    Prior to Admission medications   Medication Sig Start Date End Date Taking? Authorizing Provider  ALPRAZolam Prudy Feeler) 0.5 MG tablet Take 0.5 mg by mouth 2 (two) times daily.    [provider]  aspirin EC 81 MG EC tablet Take 1 tablet (81 mg total) by mouth daily. 06/07/19   Lurene Shadow, MD  busPIRone (BUSPAR) 10 MG tablet Take 10 mg by mouth 2 (two) times daily.  04/26/18   [provider]  Butalbital-APAP-Caffeine 50-300-40 MG CAPS Take by mouth 3 (three) times daily  as needed for headache.  04/26/18   [provider]  carisoprodol (SOMA) 350 MG tablet Take 350 mg by mouth 2 (two) times daily.    [provider]  ezetimibe (ZETIA) 10 MG tablet Take 1 tablet (10 mg total) by mouth daily. 02/26/19   Amin, Loura Halt, MD  ferrous sulfate 325 (65 FE) MG tablet Take 1 tablet (325 mg total) by mouth 2 (two) times daily with a meal. 02/25/19 04/26/19  Amin, Loura Halt, MD  furosemide (LASIX) 40 MG tablet Take 40 mg by mouth 2 (two) times daily.     [provider]  omeprazole (PRILOSEC) 20 MG capsule Take 20 mg by mouth daily.    [provider]  PARoxetine (PAXIL) 40 MG tablet Take 40 mg by mouth daily.    [provider]  potassium chloride SA (KLOR-CON) 20 MEQ tablet Take 1 tablet (20 mEq total) by mouth 2 (two) times daily. 06/07/19   Lurene Shadow, MD  prasugrel (EFFIENT) 10 MG TABS tablet Take 1 tablet (10 mg total) by mouth daily. 02/26/19   Amin, Loura Halt, MD  rosuvastatin (CRESTOR) 10 MG tablet Take 1 tablet (10 mg total) by mouth daily at 6 PM. Patient not taking: Reported on 06/06/2019 02/25/19  Dimple Nanas, MD    Review of Systems    Complains of somewhat chronic fatigue.  She denies chest pain, dyspnea, palpitations, PND, orthopnea, dizziness, syncope, edema, or early satiety.  All other systems reviewed and are otherwise negative except as noted above.  Physical Exam    VS:  BP (!) 180/84 (BP Location: Left Arm, Patient Position: Sitting, Cuff Size: Normal)   Pulse 68   Ht 5\' 4"  (1.626 m)   Wt 157 lb (71.2 kg)   SpO2 98%   BMI 26.95 kg/m  , BMI Body mass index is 26.95 kg/m. GEN: Well nourished, well developed, in no acute distress. HEENT: normal. Neck: Supple, no JVD, carotid bruits, or masses. Cardiac: RRR, no murmurs, rubs, or gallops. No clubbing, cyanosis, edema.  Radials/PT 2+ and equal bilaterally.  Respiratory:  Respirations regular and unlabored, clear to auscultation bilaterally. GI:  Soft, nontender, nondistended, BS + x 4. MS: no deformity or atrophy. Skin: warm and dry, no rash. Neuro:  Strength and sensation are intact. Psych: Normal affect.  Accessory Clinical Findings    ECG personally reviewed by me today -regular sinus rhythm, 68- no acute changes.  Lab work from primary care provider dated October 11, 2019  Hemoglobin 8.6, hematocrit 31, WBC 10.5, platelets 391, MCV 73, MCH 20.6, MCHC 28.2 Sodium 139, potassium 4.4, chloride 105, CO2 18, BUN 35, creatinine 1.62, glucose 109 Calcium 9.0, total protein 7.2, albumin 4.8 Total bilirubin 0.2, alkaline phosphatase 136, AST 15, ALT 13 Total cholesterol 226, triglycerides 279, HDL 36, LDL 134 Hemoglobin A1c 7.2   Assessment & Plan    1.  Coronary artery disease: Status post non-STEMI in January 2021 with severe left circumflex disease, which was successfully treated with a drug-eluting stent.  Showing relook catheterization in April secondary to recurrent chest pain and was found to have patent circumflex stent with nonobstructive RCA disease.  She has been medically managed since.  Following non-STEMI, recommendation was made for at least 12 months of dual antiplatelet therapy however, she says she ran out of Effient a few weeks ago and has not been taking it.  She also came off of rosuvastatin in the setting of recurrent myalgias, which were documented previously with it and atorvastatin.  On July 14, she was seen in the emergency department for an episode of chest pain and dyspnea with normal troponin and ECG.  She ended up leaving the ER prior to more formal evaluation but has not had any recurrent chest pain or dyspnea.  Recent lab work shows ongoing microcytic anemia with a hemoglobin of 8.6, which is down slightly from July (9.0/31.0).  In light of acute coronary syndrome earlier this year, I will resume P2 Y 12 therapy but will use Plavix instead of prasugrel at this time.  In the setting of ongoing anemia, I will  arrange for follow-up H&H next week as well as iron studies, and stool cards.  Blood pressure is markedly elevated today I am adding low-dose carvedilol.  She otherwise remains on aspirin and Zetia therapy.  We did discuss possibly pursuing a PCSK9 inhibitor in the setting of statin intolerance and she would like to think about it for now.  2.  Essential hypertension: Blood pressure elevated today at 180/84.  She says she typically runs in the 140s or so at home though is not really sure.  She reports compliance with Lasix therapy.  I am adding carvedilol 3.125 mg twice daily today.  I have asked her to  continue to check blood pressures at home.  3.  Hyperlipidemia: Poorly controlled in the setting of statin intolerance.  LDL of 134 in late August.  She remains on Zetia.  We did discuss possibly adding a PCSK9 inhibitor or bempedoic acid and she would like to think about it for now.  4.  Microcytic anemia: Since July, H&H has been in the 8-9/31 range.  Most recent labs notable for an MCV of 73 and MCHC of 28.2.  She denies melena.  In light of ongoing aspirin therapy with resumption of Plavix in the setting of ACS within the past 12 months, I will arrange for follow-up CBC next week and have also sent over to the lab today to pick up stool cards, as she isn't clear on what additional w/u may be planned for her anemia by her primary care provider, whom she does not see back until November.  At lab draw next week, I will check iron studies.  If H&H lower following resumption of Plavix and/or stool cards are positive, she will need discontinuation of Plavix and GI referral.  5.  Stage II-III chronic kidney disease: Creatinine elevated at most recent check at 1.62.  She says she only drinks about 4 cans of soda a day.  I encouraged better hydration.  She is also taking Lasix 40 mg twice daily.  Follow-up basic metabolic panel next week.  May need nephrology referral.  6.  History of tobacco abuse: Quit in  January.  I congratulated her on this and encouraged her to remain off of cigarettes.   Nicolasa Ducking, NP 10/19/2019, 11:23 AM

## 2019-10-21 NOTE — Addendum Note (Signed)
Addended by: Efrain Sella on: 10/21/2019 09:09 AM   Modules accepted: Orders

## 2019-10-25 ENCOUNTER — Telehealth: Payer: Self-pay

## 2019-10-25 DIAGNOSIS — D509 Iron deficiency anemia, unspecified: Secondary | ICD-10-CM

## 2019-10-25 NOTE — Telephone Encounter (Signed)
Left message to talk with patient about stool cards she needs to pick up from [facility] lab when she has blood work done this week.

## 2019-10-26 ENCOUNTER — Inpatient Hospital Stay: Payer: 59

## 2019-10-26 ENCOUNTER — Inpatient Hospital Stay: Payer: 59 | Admitting: Internal Medicine

## 2019-11-01 NOTE — Telephone Encounter (Signed)
Pt has not obtained labs or stool cards. Call attempted. No answer, VM box full.

## 2019-11-02 ENCOUNTER — Encounter: Payer: Self-pay | Admitting: Internal Medicine

## 2019-11-02 ENCOUNTER — Inpatient Hospital Stay: Payer: 59

## 2019-11-02 ENCOUNTER — Inpatient Hospital Stay: Payer: 59 | Attending: Internal Medicine | Admitting: Internal Medicine

## 2019-11-02 ENCOUNTER — Other Ambulatory Visit: Payer: Self-pay

## 2019-11-02 DIAGNOSIS — Z803 Family history of malignant neoplasm of breast: Secondary | ICD-10-CM | POA: Diagnosis not present

## 2019-11-02 DIAGNOSIS — D509 Iron deficiency anemia, unspecified: Secondary | ICD-10-CM

## 2019-11-02 DIAGNOSIS — M549 Dorsalgia, unspecified: Secondary | ICD-10-CM | POA: Diagnosis not present

## 2019-11-02 DIAGNOSIS — Z79899 Other long term (current) drug therapy: Secondary | ICD-10-CM | POA: Diagnosis not present

## 2019-11-02 DIAGNOSIS — D649 Anemia, unspecified: Secondary | ICD-10-CM | POA: Diagnosis not present

## 2019-11-02 DIAGNOSIS — M2550 Pain in unspecified joint: Secondary | ICD-10-CM | POA: Insufficient documentation

## 2019-11-02 DIAGNOSIS — Z87891 Personal history of nicotine dependence: Secondary | ICD-10-CM | POA: Diagnosis not present

## 2019-11-02 DIAGNOSIS — Z801 Family history of malignant neoplasm of trachea, bronchus and lung: Secondary | ICD-10-CM | POA: Diagnosis not present

## 2019-11-02 DIAGNOSIS — Z8249 Family history of ischemic heart disease and other diseases of the circulatory system: Secondary | ICD-10-CM | POA: Diagnosis not present

## 2019-11-02 DIAGNOSIS — Z818 Family history of other mental and behavioral disorders: Secondary | ICD-10-CM | POA: Diagnosis not present

## 2019-11-02 DIAGNOSIS — N1831 Chronic kidney disease, stage 3a: Secondary | ICD-10-CM | POA: Diagnosis not present

## 2019-11-02 DIAGNOSIS — R5383 Other fatigue: Secondary | ICD-10-CM | POA: Diagnosis not present

## 2019-11-02 DIAGNOSIS — E1122 Type 2 diabetes mellitus with diabetic chronic kidney disease: Secondary | ICD-10-CM | POA: Diagnosis not present

## 2019-11-02 DIAGNOSIS — R1310 Dysphagia, unspecified: Secondary | ICD-10-CM | POA: Diagnosis not present

## 2019-11-02 LAB — COMPREHENSIVE METABOLIC PANEL
ALT: 25 U/L (ref 0–44)
AST: 24 U/L (ref 15–41)
Albumin: 4.3 g/dL (ref 3.5–5.0)
Alkaline Phosphatase: 127 U/L — ABNORMAL HIGH (ref 38–126)
Anion gap: 12 (ref 5–15)
BUN: 21 mg/dL (ref 8–23)
CO2: 24 mmol/L (ref 22–32)
Calcium: 9.2 mg/dL (ref 8.9–10.3)
Chloride: 101 mmol/L (ref 98–111)
Creatinine, Ser: 1.16 mg/dL — ABNORMAL HIGH (ref 0.44–1.00)
GFR calc Af Amer: 59 mL/min — ABNORMAL LOW (ref >60)
GFR calc non Af Amer: 51 mL/min — ABNORMAL LOW (ref >60)
Glucose, Bld: 177 mg/dL — ABNORMAL HIGH (ref 70–99)
Potassium: 3.1 mmol/L — ABNORMAL LOW (ref 3.5–5.1)
Sodium: 137 mmol/L (ref 135–145)
Total Bilirubin: 0.4 mg/dL (ref 0.3–1.2)
Total Protein: 8.2 g/dL — ABNORMAL HIGH (ref 6.5–8.1)

## 2019-11-02 LAB — IRON AND TIBC
Iron: 36 ug/dL (ref 28–170)
Saturation Ratios: 7 % — ABNORMAL LOW (ref 10.4–31.8)
TIBC: 484 ug/dL — ABNORMAL HIGH (ref 250–450)
UIBC: 448 ug/dL

## 2019-11-02 LAB — FERRITIN: Ferritin: 6 ng/mL — ABNORMAL LOW (ref 11–307)

## 2019-11-02 LAB — TECHNOLOGIST SMEAR REVIEW: Plt Morphology: INCREASED

## 2019-11-02 LAB — FOLATE: Folate: 66 ng/mL (ref >5.9)

## 2019-11-02 LAB — CBC WITH DIFFERENTIAL/PLATELET
Abs Immature Granulocytes: 0.04 10*3/uL (ref 0.00–0.07)
Basophils Absolute: 0.1 10*3/uL (ref 0.0–0.1)
Basophils Relative: 1 %
Eosinophils Absolute: 0.2 10*3/uL (ref 0.0–0.5)
Eosinophils Relative: 2 %
HCT: 34.4 % — ABNORMAL LOW (ref 36.0–46.0)
Hemoglobin: 10.1 g/dL — ABNORMAL LOW (ref 12.0–15.0)
Immature Granulocytes: 0 %
Lymphocytes Relative: 18 %
Lymphs Abs: 1.8 10*3/uL (ref 0.7–4.0)
MCH: 20.8 pg — ABNORMAL LOW (ref 26.0–34.0)
MCHC: 29.4 g/dL — ABNORMAL LOW (ref 30.0–36.0)
MCV: 70.9 fL — ABNORMAL LOW (ref 80.0–100.0)
Monocytes Absolute: 0.7 10*3/uL (ref 0.1–1.0)
Monocytes Relative: 7 %
Neutro Abs: 7.1 10*3/uL (ref 1.7–7.7)
Neutrophils Relative %: 72 %
Platelets: 475 10*3/uL — ABNORMAL HIGH (ref 150–400)
RBC: 4.85 MIL/uL (ref 3.87–5.11)
RDW: 21.1 % — ABNORMAL HIGH (ref 11.5–15.5)
WBC: 10 10*3/uL (ref 4.0–10.5)
nRBC: 0 % (ref 0.0–0.2)

## 2019-11-02 LAB — RETICULOCYTES
Immature Retic Fract: 19.8 % — ABNORMAL HIGH (ref 2.3–15.9)
RBC.: 4.84 MIL/uL (ref 3.87–5.11)
Retic Count, Absolute: 73.6 10*3/uL (ref 19.0–186.0)
Retic Ct Pct: 1.5 % (ref 0.4–3.1)

## 2019-11-02 LAB — C-REACTIVE PROTEIN: CRP: 0.6 mg/dL (ref <1.0)

## 2019-11-02 LAB — LACTATE DEHYDROGENASE: LDH: 113 U/L (ref 98–192)

## 2019-11-02 NOTE — Assessment & Plan Note (Addendum)
Hb 9-12.  MCV -76; the etiology is unclear-iron deficiency/CKD stage III versus anemia of chronic disease/rheumatoid arthritis.  recommend checking CBC CMP LDH ferritin; iron studies; peripheral smear kappa lambda light chain; myeloma panel.  Reticulocyte count.  CRP.  # DM-2-[poorly controlled August 2021 A1c 7.5]-patient not on oral hypoglycemics.  Recommend follow-up with PCP regarding restarting diabetic medication.  # CKD-III. unclear etiology will need kidney ultrasound.  # smoking- Discussed with the patient regarding the ill effects of smoking- including but not limited to cardiac lung and vascular diseases and malignancies.  Discuss lung cancer screening program.  Thank you Dr.Niemeyer MD for allowing me to participate in the care of your pleasant patient. Please do not hesitate to contact me with questions or concerns in the interim.   # DISPOSITION: # labs today # follow up in 7- 10 days- MD; no labs- Dr.B  Cc; PCP.

## 2019-11-02 NOTE — Progress Notes (Signed)
[facility] CONSULT NOTE  Patient Care Team: Evelene Croon, MD as PCP - General (Family Medicine) End, Cristal Deer, MD as PCP - Cardiology (Cardiology)  CHIEF COMPLAINTS/PURPOSE OF CONSULTATION:    HEMATOLOGY HISTORY  # ANEMIA Hb- 8.7 [PCP; ]-; colonoscopy/EGD [Dr.Oh; 2016-2017]  # DM-2 [HbA1c- 7.9- Aug 2021]- not on meds; CKD- III [GFR 35- AUG 2021]  HISTORY OF PRESENTING ILLNESS:  Whitney Bernard 59 y.o.  female has been referred to [place] for further evaluation/work-up for anemia.  Patient complains of worsening fatigue over the last many months.   Blood in stools: None Change in bowel habits- None Blood in urine: None Difficulty swallowing: intermittent difficulty swallowing solids/ not any worse.  Abnormal weight loss: None Iron supplementation:on PO iron.  Prior Blood transfusions: none Vaginal bleeding: None  Review of Systems  Constitutional: Positive for malaise/fatigue. Negative for chills, diaphoresis, fever and weight loss.  HENT: Negative for nosebleeds and sore throat.   Eyes: Negative for double vision.  Respiratory: Negative for cough, hemoptysis, sputum production, shortness of breath and wheezing.   Cardiovascular: Negative for chest pain, palpitations, orthopnea and leg swelling.  Gastrointestinal: Negative for abdominal pain, blood in stool, constipation, diarrhea, heartburn, melena, nausea and vomiting.  Genitourinary: Negative for dysuria, frequency and urgency.  Musculoskeletal: Positive for back pain and joint pain.  Skin: Negative.  Negative for itching and rash.  Neurological: Negative for dizziness, tingling, focal weakness, weakness and headaches.  Endo/Heme/Allergies: Does not bruise/bleed easily.  Psychiatric/Behavioral: Negative for depression. The patient is not nervous/anxious and does not have insomnia.     MEDICAL HISTORY:  Past Medical History:  Diagnosis Date  . Acute pancreatitis   . CAD (coronary artery disease)    a.  08/2017 Neg MV; b. 01/2019 CTA Chest: Mild cor Ca2+; c. 02/2019 NSTEMI/PCI: LCX 95p/m (2.75x15 Resolute Onyx DES); d. 05/2019 Cath: LM nl, LAD nl, LCX patent stent, RCA 30ost/p-->Med rx.  . Chronic pain syndrome   . CKD (chronic kidney disease), stage III   . COPD (chronic obstructive pulmonary disease) (HCC)   . Depression   . Diabetes mellitus without complication (HCC)   . Headache(784.0)    migraines  . History of echocardiogram    a. 04/2018 Echo: EF 55-60%. No significant valvular dzs; b. 02/2019 Echo: EF 60-65%. Nl RV fxn.  . Hypercholesteremia   . Hypertension   . Hyperthyroidism   . Hypokalemia   . Migraine   . Narcotic abuse (HCC)   . RA (rheumatoid arthritis) (HCC)     SURGICAL HISTORY: Past Surgical History:  Procedure Laterality Date  . ANTERIOR CERVICAL DECOMP/DISCECTOMY FUSION N/A 11/26/2012   Procedure: ANTERIOR CERVICAL DECOMPRESSION/DISCECTOMY FUSION 2 LEVELS;  Surgeon: Reinaldo Meeker, MD;  Location: [facility]UR[facility]S;  Service: Neurosurgery;  Laterality: N/A;  ANTERIOR CERVICAL DECOMPRESSION/DISCECTOMY FUSION 2 LEVELS  . CERVICAL POLYPECTOMY    . CHOLECYSTECTOMY    . CORONARY STENT INTERVENTION N/A 02/23/2019   Procedure: CORONARY STENT INTERVENTION;  Surgeon: Yvonne Kendall, MD;  Location: [facility] INVASIVE CV LAB;  Service: Cardiovascular;  Laterality: N/A;  . ESOPHAGOGASTRODUODENOSCOPY N/A 12/26/2014   Procedure: ESOPHAGOGASTRODUODENOSCOPY (EGD);  Surgeon: Midge Minium, MD;  Location: [facility] ENDOSCOPY;  Service: Endoscopy;  Laterality: N/A;  . FRACTURE SURGERY    . LEFT HEART CATH AND CORONARY ANGIOGRAPHY N/A 02/23/2019   Procedure: LEFT HEART CATH AND CORONARY ANGIOGRAPHY;  Surgeon: Antonieta Iba, MD;  Location: [facility] INVASIVE CV LAB;  Service: Cardiovascular;  Laterality: N/A;  . LEFT HEART CATH AND CORONARY  ANGIOGRAPHY N/A 06/06/2019   Procedure: LEFT HEART CATH AND CORONARY ANGIOGRAPHY;  Surgeon: Iran Ouch, MD;  Location: [facility] INVASIVE CV LAB;  Service:  Cardiovascular;  Laterality: N/A;  . NECK SURGERY    . TUBAL LIGATION    . WISDOM TOOTH EXTRACTION      SOCIAL HISTORY: Social History   Socioeconomic History  . Marital status: Married    Spouse name: Not on file  . Number of children: Not on file  . Years of education: Not on file  . Highest education level: Not on file  Occupational History  . Not on file  Tobacco Use  . Smoking status: Former Smoker    Packs/day: 0.50    Years: 38.00    Pack years: 19.00    Types: Cigarettes    Quit date: 11/14/2012    Years since quitting: 6.9  . Smokeless tobacco: Never Used  Substance and Sexual Activity  . Alcohol use: Not Currently    Comment: occ  . Drug use: No  . Sexual activity: Yes    Birth control/protection: Surgical  Other Topics Concern  . Not on file  Social History Narrative   In [place] with husbands; and 2 sons. 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 Smoke- quitting; no alcohol.    Social Determinants of Health   Financial Resource Strain:   . Difficulty of Paying Living Expenses: Not on file  Food Insecurity:   . Worried About Programme researcher, broadcasting/film/video in the Last Year: Not on file  . Ran Out of Food in the Last Year: Not on file  Transportation Needs:   . Lack of Transportation (Medical): Not on file  . Lack of Transportation (Non-Medical): Not on file  Physical Activity:   . Days of Exercise per Week: Not on file  . Minutes of Exercise per Session: Not on file  Stress:   . Feeling of Stress : Not on file  Social Connections:   . Frequency of Communication with Friends and Family: Not on file  . Frequency of Social Gatherings with Friends and Family: Not on file  . Attends Religious Services: Not on file  . Active Member of Clubs or Organizations: Not on file  . Attends Banker Meetings: Not on file  . Marital Status: Not on file  Intimate Partner Violence:   . Fear of Current or Ex-Partner: Not on file  . Emotionally Abused: Not on file  . Physically  Abused: Not on file  . Sexually Abused: Not on file    FAMILY HISTORY: Family History  Problem Relation Age of Onset  . CAD Father   . Alzheimer's disease Other   . Breast cancer Paternal Aunt        another aunt- Polycythemia vera.   . Breast cancer Cousin   . Lung cancer Paternal Uncle     ALLERGIES:  is allergic to almond oil, atorvastatin, and rosuvastatin.  MEDICATIONS:  Current Outpatient Medications  Medication Sig Dispense Refill  . aspirin EC 81 MG EC tablet Take 1 tablet (81 mg total) by mouth daily.    . busPIRone (BUSPAR) 10 MG tablet Take 10 mg by mouth 2 (two) times daily.     . carvedilol (COREG) 3.125 MG tablet Take 1 tablet (3.125 mg total) by mouth 2 (two) times daily. 60 tablet 6  . clopidogrel (PLAVIX) 75 MG tablet Take 1 tablet (75 mg total) by mouth daily. 90 tablet 3  . ezetimibe (ZETIA) 10 MG tablet Take 1 tablet (  10 mg total) by mouth daily. 60 tablet 0  . furosemide (LASIX) 40 MG tablet Take 40 mg by mouth 2 (two) times daily.     Marland Kitchen omeprazole (PRILOSEC) 20 MG capsule Take 20 mg by mouth daily.    Marland Kitchen PARoxetine (PAXIL) 40 MG tablet Take 40 mg by mouth daily.    . potassium chloride SA (KLOR-CON) 20 MEQ tablet Take 1 tablet (20 mEq total) by mouth 2 (two) times daily.    . Butalbital-APAP-Caffeine 50-300-40 MG CAPS Take by mouth 3 (three) times daily as needed for headache.  (Patient not taking: Reported on 11/02/2019)     No current facility-administered medications for this visit.      PHYSICAL EXAMINATION:   Vitals:   11/02/19 1107  BP: (!) 154/88  Pulse: 82  Resp: 16  Temp: 98.4 F (36.9 C)  SpO2: 97%   Filed Weights   11/02/19 1107  Weight: 153 lb 9.6 oz (69.7 kg)    Physical Exam HENT:     Head: Normocephalic and atraumatic.     Mouth/Throat:     Pharynx: No oropharyngeal exudate.  Eyes:     Pupils: Pupils are equal, round, and reactive to light.  Cardiovascular:     Rate and Rhythm: Normal rate and regular rhythm.  Pulmonary:      Effort: Pulmonary effort is normal. No respiratory distress.     Breath sounds: Normal breath sounds. No wheezing.  Abdominal:     General: Bowel sounds are normal. There is no distension.     Palpations: Abdomen is soft. There is no mass.     Tenderness: There is no abdominal tenderness. There is no guarding or rebound.  Musculoskeletal:        General: No tenderness. Normal range of motion.     Cervical back: Normal range of motion and neck supple.  Skin:    General: Skin is warm.  Neurological:     Mental Status: She is alert and oriented to person, place, and time.  Psychiatric:        Mood and Affect: Affect normal.     LABORATORY DATA:  I have reviewed the data as listed Lab Results  Component Value Date   WBC 10.0 11/02/2019   HGB 10.1 (L) 11/02/2019   HCT 34.4 (L) 11/02/2019   MCV 70.9 (L) 11/02/2019   PLT 475 (H) 11/02/2019   Recent Labs    02/25/19 0655 02/25/19 0655 06/06/19 0537 06/06/19 0537 06/07/19 0416 08/31/19 0702 11/02/19 1213  NA 139   < > 138   < > 142 139 137  K 3.8   < > 3.9   < > 5.0 4.0 3.1*  CL 105   < > 109   < > 114* 105 101  CO2 25   < > 21*   < > 21* 25 24  GLUCOSE 126*   < > 163*   < > 111* 146* 177*  BUN 17   < > 26*   < > 17 14 21   CREATININE 1.30*   < > 1.38*   < > 1.15* 1.26* 1.16*  CALCIUM 8.4*   < > 9.0   < > 9.4 9.3 9.2  GFRNONAA 45*   < > 41*   < > 51* 46* 51*  GFRAA 52*   < > 48*   < > 59* 53* 59*  PROT 6.4*  --  7.4  --   --   --  8.2*  ALBUMIN 3.1*  --  3.7  --   --   --  4.3  AST 17  --  14*  --   --   --  24  ALT 21  --  16  --   --   --  25  ALKPHOS 69  --  106  --   --   --  127*  BILITOT 0.6  --  0.6  --   --   --  0.4  BILIDIR  --   --  <0.1  --   --   --   --   IBILI  --   --  NOT CALCULATED  --   --   --   --    < > = values in this interval not displayed.     No results found.  Microcytic anemia Hb 9-12.  MCV -76; the etiology is unclear-iron deficiency/CKD stage III versus anemia of chronic  disease/rheumatoid arthritis.  recommend checking CBC CMP LDH ferritin; iron studies; peripheral smear kappa lambda light chain; myeloma panel.  Reticulocyte count.  CRP.  # DM-2-[poorly controlled August 2021 A1c 7.5]-patient not on oral hypoglycemics.  Recommend follow-up with PCP regarding restarting diabetic medication.  # CKD-III. unclear etiology will need kidney ultrasound.  # smoking- Discussed with the patient regarding the ill effects of smoking- including but not limited to cardiac lung and vascular diseases and malignancies.  Discuss lung cancer screening program.  Thank you Dr.Niemeyer MD for allowing me to participate in the care of your pleasant patient. Please do not hesitate to contact me with questions or concerns in the interim.   # DISPOSITION: # labs today # follow up in 7- 10 days- MD; no labs- Dr.B  Cc; PCP.   All questions were answered. The patient knows to call the clinic with any problems, questions or concerns.   Earna Coder, MD 11/02/2019 2:46 PM

## 2019-11-02 NOTE — Telephone Encounter (Signed)
3rd attempt. No answer, no VM.   Letter sent to patient.  Encounter completed at this time.

## 2019-11-03 LAB — KAPPA/LAMBDA LIGHT CHAINS
Kappa free light chain: 49.8 mg/L — ABNORMAL HIGH (ref 3.3–19.4)
Kappa, lambda light chain ratio: 1.94 — ABNORMAL HIGH (ref 0.26–1.65)
Lambda free light chains: 25.7 mg/L (ref 5.7–26.3)

## 2019-11-04 LAB — MULTIPLE MYELOMA PANEL, SERUM
Albumin SerPl Elph-Mcnc: 3.8 g/dL (ref 2.9–4.4)
Albumin/Glob SerPl: 1.1 (ref 0.7–1.7)
Alpha 1: 0.2 g/dL (ref 0.0–0.4)
Alpha2 Glob SerPl Elph-Mcnc: 1 g/dL (ref 0.4–1.0)
B-Globulin SerPl Elph-Mcnc: 1.4 g/dL — ABNORMAL HIGH (ref 0.7–1.3)
Gamma Glob SerPl Elph-Mcnc: 1.1 g/dL (ref 0.4–1.8)
Globulin, Total: 3.7 g/dL (ref 2.2–3.9)
IgA: 323 mg/dL (ref 87–352)
IgG (Immunoglobin G), Serum: 975 mg/dL (ref 586–1602)
IgM (Immunoglobulin M), Srm: 198 mg/dL (ref 26–217)
Total Protein ELP: 7.5 g/dL (ref 6.0–8.5)

## 2019-11-14 ENCOUNTER — Other Ambulatory Visit: Payer: Self-pay | Admitting: Family Medicine

## 2019-11-17 ENCOUNTER — Inpatient Hospital Stay: Payer: 59 | Admitting: Internal Medicine

## 2019-11-17 NOTE — Progress Notes (Deleted)
[facility] CONSULT NOTE  Patient Care Team: Evelene Croon, MD as PCP - General (Family Medicine) End, Cristal Deer, MD as PCP - Cardiology (Cardiology)  CHIEF COMPLAINTS/PURPOSE OF CONSULTATION:    HEMATOLOGY HISTORY  # ANEMIA Hb- 8.7 [PCP; ]-; colonoscopy/EGD [Dr.Oh; 2016-2017]  # DM-2 [HbA1c- 7.9- Aug 2021]- not on meds; CKD- III [GFR 35- AUG 2021]  HISTORY OF PRESENTING ILLNESS:  Whitney Bernard 59 y.o.  female has been referred to [place] for further evaluation/work-up for anemia.  Patient complains of worsening fatigue over the last many months.   Blood in stools: None Change in bowel habits- None Blood in urine: None Difficulty swallowing: intermittent difficulty swallowing solids/ not any worse.  Abnormal weight loss: None Iron supplementation:on PO iron.  Prior Blood transfusions: none Vaginal bleeding: None  Review of Systems  Constitutional: Positive for malaise/fatigue. Negative for chills, diaphoresis, fever and weight loss.  HENT: Negative for nosebleeds and sore throat.   Eyes: Negative for double vision.  Respiratory: Negative for cough, hemoptysis, sputum production, shortness of breath and wheezing.   Cardiovascular: Negative for chest pain, palpitations, orthopnea and leg swelling.  Gastrointestinal: Negative for abdominal pain, blood in stool, constipation, diarrhea, heartburn, melena, nausea and vomiting.  Genitourinary: Negative for dysuria, frequency and urgency.  Musculoskeletal: Positive for back pain and joint pain.  Skin: Negative.  Negative for itching and rash.  Neurological: Negative for dizziness, tingling, focal weakness, weakness and headaches.  Endo/Heme/Allergies: Does not bruise/bleed easily.  Psychiatric/Behavioral: Negative for depression. The patient is not nervous/anxious and does not have insomnia.     MEDICAL HISTORY:  Past Medical History:  Diagnosis Date  . Acute pancreatitis   . CAD (coronary artery disease)    a.  08/2017 Neg MV; b. 01/2019 CTA Chest: Mild cor Ca2+; c. 02/2019 NSTEMI/PCI: LCX 95p/m (2.75x15 Resolute Onyx DES); d. 05/2019 Cath: LM nl, LAD nl, LCX patent stent, RCA 30ost/p-->Med rx.  . Chronic pain syndrome   . CKD (chronic kidney disease), stage III   . COPD (chronic obstructive pulmonary disease) (HCC)   . Depression   . Diabetes mellitus without complication (HCC)   . Headache(784.0)    migraines  . History of echocardiogram    a. 04/2018 Echo: EF 55-60%. No significant valvular dzs; b. 02/2019 Echo: EF 60-65%. Nl RV fxn.  . Hypercholesteremia   . Hypertension   . Hyperthyroidism   . Hypokalemia   . Migraine   . Narcotic abuse (HCC)   . RA (rheumatoid arthritis) (HCC)     SURGICAL HISTORY: Past Surgical History:  Procedure Laterality Date  . ANTERIOR CERVICAL DECOMP/DISCECTOMY FUSION N/A 11/26/2012   Procedure: ANTERIOR CERVICAL DECOMPRESSION/DISCECTOMY FUSION 2 LEVELS;  Surgeon: Reinaldo Meeker, MD;  Location: [facility];  Service: Neurosurgery;  Laterality: N/A;  ANTERIOR CERVICAL DECOMPRESSION/DISCECTOMY FUSION 2 LEVELS  . CERVICAL POLYPECTOMY    . CHOLECYSTECTOMY    . CORONARY STENT INTERVENTION N/A 02/23/2019   Procedure: CORONARY STENT INTERVENTION;  Surgeon: Yvonne Kendall, MD;  Location: [facility] INVASIVE CV LAB;  Service: Cardiovascular;  Laterality: N/A;  . ESOPHAGOGASTRODUODENOSCOPY N/A 12/26/2014   Procedure: ESOPHAGOGASTRODUODENOSCOPY (EGD);  Surgeon: Midge Minium, MD;  Location: [facility] ENDOSCOPY;  Service: Endoscopy;  Laterality: N/A;  . FRACTURE SURGERY    . LEFT HEART CATH AND CORONARY ANGIOGRAPHY N/A 02/23/2019   Procedure: LEFT HEART CATH AND CORONARY ANGIOGRAPHY;  Surgeon: Antonieta Iba, MD;  Location: [facility] INVASIVE CV LAB;  Service: Cardiovascular;  Laterality: N/A;  . LEFT HEART CATH AND CORONARY  ANGIOGRAPHY N/A 06/06/2019   Procedure: LEFT HEART CATH AND CORONARY ANGIOGRAPHY;  Surgeon: Iran Ouch, MD;  Location: [facility] INVASIVE CV LAB;  Service:  Cardiovascular;  Laterality: N/A;  . NECK SURGERY    . TUBAL LIGATION    . WISDOM TOOTH EXTRACTION      SOCIAL HISTORY: Social History   Socioeconomic History  . Marital status: Married    Spouse name: Not on file  . Number of children: Not on file  . Years of education: Not on file  . Highest education level: Not on file  Occupational History  . Not on file  Tobacco Use  . Smoking status: Former Smoker    Packs/day: 0.50    Years: 38.00    Pack years: 19.00    Types: Cigarettes    Quit date: 11/14/2012    Years since quitting: 7.0  . Smokeless tobacco: Never Used  Substance and Sexual Activity  . Alcohol use: Not Currently    Comment: occ  . Drug use: No  . Sexual activity: Yes    Birth control/protection: Surgical  Other Topics Concern  . Not on file  Social History Narrative   In [place] with husbands; and 2 sons. 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 Smoke- quitting; no alcohol.    Social Determinants of Health   Financial Resource Strain:   . Difficulty of Paying Living Expenses: Not on file  Food Insecurity:   . Worried About Programme researcher, broadcasting/film/video in the Last Year: Not on file  . Ran Out of Food in the Last Year: Not on file  Transportation Needs:   . Lack of Transportation (Medical): Not on file  . Lack of Transportation (Non-Medical): Not on file  Physical Activity:   . Days of Exercise per Week: Not on file  . Minutes of Exercise per Session: Not on file  Stress:   . Feeling of Stress : Not on file  Social Connections:   . Frequency of Communication with Friends and Family: Not on file  . Frequency of Social Gatherings with Friends and Family: Not on file  . Attends Religious Services: Not on file  . Active Member of Clubs or Organizations: Not on file  . Attends Banker Meetings: Not on file  . Marital Status: Not on file  Intimate Partner Violence:   . Fear of Current or Ex-Partner: Not on file  . Emotionally Abused: Not on file  . Physically  Abused: Not on file  . Sexually Abused: Not on file    FAMILY HISTORY: Family History  Problem Relation Age of Onset  . CAD Father   . Alzheimer's disease Other   . Breast cancer Paternal Aunt        another aunt- Polycythemia vera.   . Breast cancer Cousin   . Lung cancer Paternal Uncle     ALLERGIES:  is allergic to almond oil, atorvastatin, and rosuvastatin.  MEDICATIONS:  Current Outpatient Medications  Medication Sig Dispense Refill  . aspirin EC 81 MG EC tablet Take 1 tablet (81 mg total) by mouth daily.    . busPIRone (BUSPAR) 10 MG tablet Take 10 mg by mouth 2 (two) times daily.     . Butalbital-APAP-Caffeine 50-300-40 MG CAPS Take by mouth 3 (three) times daily as needed for headache.  (Patient not taking: Reported on 11/02/2019)    . carvedilol (COREG) 3.125 MG tablet Take 1 tablet (3.125 mg total) by mouth 2 (two) times daily. 60 tablet 6  . clopidogrel (  PLAVIX) 75 MG tablet Take 1 tablet (75 mg total) by mouth daily. 90 tablet 3  . ezetimibe (ZETIA) 10 MG tablet Take 1 tablet (10 mg total) by mouth daily. 60 tablet 0  . furosemide (LASIX) 40 MG tablet Take 40 mg by mouth 2 (two) times daily.     Marland Kitchen omeprazole (PRILOSEC) 20 MG capsule Take 20 mg by mouth daily.    Marland Kitchen PARoxetine (PAXIL) 40 MG tablet Take 40 mg by mouth daily.    . potassium chloride SA (KLOR-CON) 20 MEQ tablet Take 1 tablet (20 mEq total) by mouth 2 (two) times daily.     No current facility-administered medications for this visit.      PHYSICAL EXAMINATION:   There were no vitals filed for this visit. There were no vitals filed for this visit.  Physical Exam HENT:     Head: Normocephalic and atraumatic.     Mouth/Throat:     Pharynx: No oropharyngeal exudate.  Eyes:     Pupils: Pupils are equal, round, and reactive to light.  Cardiovascular:     Rate and Rhythm: Normal rate and regular rhythm.  Pulmonary:     Effort: Pulmonary effort is normal. No respiratory distress.     Breath sounds:  Normal breath sounds. No wheezing.  Abdominal:     General: Bowel sounds are normal. There is no distension.     Palpations: Abdomen is soft. There is no mass.     Tenderness: There is no abdominal tenderness. There is no guarding or rebound.  Musculoskeletal:        General: No tenderness. Normal range of motion.     Cervical back: Normal range of motion and neck supple.  Skin:    General: Skin is warm.  Neurological:     Mental Status: She is alert and oriented to person, place, and time.  Psychiatric:        Mood and Affect: Affect normal.     LABORATORY DATA:  I have reviewed the data as listed Lab Results  Component Value Date   WBC 10.0 11/02/2019   HGB 10.1 (L) 11/02/2019   HCT 34.4 (L) 11/02/2019   MCV 70.9 (L) 11/02/2019   PLT 475 (H) 11/02/2019   Recent Labs    02/25/19 0655 02/25/19 0655 06/06/19 0537 06/06/19 0537 06/07/19 0416 08/31/19 0702 11/02/19 1213  NA 139   < > 138   < > 142 139 137  K 3.8   < > 3.9   < > 5.0 4.0 3.1*  CL 105   < > 109   < > 114* 105 101  CO2 25   < > 21*   < > 21* 25 24  GLUCOSE 126*   < > 163*   < > 111* 146* 177*  BUN 17   < > 26*   < > 17 14 21   CREATININE 1.30*   < > 1.38*   < > 1.15* 1.26* 1.16*  CALCIUM 8.4*   < > 9.0   < > 9.4 9.3 9.2  GFRNONAA 45*   < > 41*   < > 51* 46* 51*  GFRAA 52*   < > 48*   < > 59* 53* 59*  PROT 6.4*  --  7.4  --   --   --  8.2*  ALBUMIN 3.1*  --  3.7  --   --   --  4.3  AST 17  --  14*  --   --   --  24  ALT 21  --  16  --   --   --  25  ALKPHOS 69  --  106  --   --   --  127*  BILITOT 0.6  --  0.6  --   --   --  0.4  BILIDIR  --   --  <0.1  --   --   --   --   IBILI  --   --  NOT CALCULATED  --   --   --   --    < > = values in this interval not displayed.     No results found.  No problem-specific Assessment & Plan notes found for this encounter.  All questions were answered. The patient knows to call the clinic with any problems, questions or concerns.   Earna Coder,  MD 11/17/2019 10:13 AM

## 2019-11-17 NOTE — Assessment & Plan Note (Deleted)
Hb 9-12.  MCV -76; the etiology is unclear-iron deficiency/CKD stage III versus anemia of chronic disease/rheumatoid arthritis.  recommend checking CBC CMP LDH ferritin; iron studies; peripheral smear kappa lambda light chain; myeloma panel.  Reticulocyte count.  CRP. ° °# DM-2-[poorly controlled August 2021 A1c 7.5]-patient not on oral hypoglycemics.  Recommend follow-up with PCP regarding restarting diabetic medication. ° °# CKD-III. unclear etiology will need kidney ultrasound. ° °# smoking- Discussed with the patient regarding the ill effects of smoking- including but not limited to cardiac lung and vascular diseases and malignancies.  Discuss lung cancer screening program. ° °Thank you Dr.Niemeyer MD for allowing me to participate in the care of your pleasant patient. Please do not hesitate to contact me with questions or concerns in the interim. ° ° °# DISPOSITION: °# labs today °# follow up in 7- 10 days- MD; no labs- Dr.B ° °Cc; PCP.  °

## 2019-11-21 ENCOUNTER — Ambulatory Visit: Payer: 59 | Admitting: Internal Medicine

## 2019-11-21 ENCOUNTER — Telehealth: Payer: Self-pay | Admitting: *Deleted

## 2019-11-21 NOTE — Progress Notes (Deleted)
Follow-up Outpatient Visit Date: 11/21/2019  Primary Care Provider: Evelene Croon, MD [facility] [place] [address]  Chief Complaint: ***  HPI:  Whitney Bernard is a 59 y.o. female with history of Coronary artery disease with NSTEMI status post PCI to the LCx in 02/2019, type 2 diabetes mellitus, hypertension, hyperlipidemia with statin intolerance, hypothyroidism, chronic pain disorder with narcotic use, chronic kidney disease, COVID-19 infection (10/2018, rheumatoid arthritis, migraine disorder, COPD, colitis, diverticulitis, and depression, who presents for follow-up of coronary artery disease.  She was last seen in our office a month ago by Ward Givens, NP, at which time she complained of chronic fatigue and low energy.  She was also seen in the emergency department in mid July with complaints of chest pain and shortness of breath.  Work-up was notable for anemia with a hemoglobin of 9.  She wound up leaving before evaluation is was completed due to extended wait times in the ED.  She also stopped taking prasugrel this summer as she ran out of the medication.  She stopped taking rosuvastatin due to myalgias.  She remains on ezetimibe and aspirin.  She was restarted on clopidogrel in place of prasugrel given her anemia.  Addition of PCSK9 inhibitor or bempedoic acid was discussed but deferred as the patient wished to think about this more on her own..  --------------------------------------------------------------------------------------------------  Past Medical History:  Diagnosis Date  . Acute pancreatitis   . CAD (coronary artery disease)    a. 08/2017 Neg MV; b. 01/2019 CTA Chest: Mild cor Ca2+; c. 02/2019 NSTEMI/PCI: LCX 95p/m (2.75x15 Resolute Onyx DES); d. 05/2019 Cath: LM nl, LAD nl, LCX patent stent, RCA 30ost/p-->Med rx.  . Chronic pain syndrome   . CKD (chronic kidney disease), stage III   . COPD (chronic obstructive pulmonary disease) (HCC)   . Depression   . Diabetes mellitus  without complication (HCC)   . Headache(784.0)    migraines  . History of echocardiogram    a. 04/2018 Echo: EF 55-60%. No significant valvular dzs; b. 02/2019 Echo: EF 60-65%. Nl RV fxn.  . Hypercholesteremia   . Hypertension   . Hyperthyroidism   . Hypokalemia   . Migraine   . Narcotic abuse (HCC)   . RA (rheumatoid arthritis) (HCC)    Past Surgical History:  Procedure Laterality Date  . ANTERIOR CERVICAL DECOMP/DISCECTOMY FUSION N/A 11/26/2012   Procedure: ANTERIOR CERVICAL DECOMPRESSION/DISCECTOMY FUSION 2 LEVELS;  Surgeon: Reinaldo Meeker, MD;  Location: [facility] NEURO ORS;  Service: Neurosurgery;  Laterality: N/A;  ANTERIOR CERVICAL DECOMPRESSION/DISCECTOMY FUSION 2 LEVELS  . CERVICAL POLYPECTOMY    . CHOLECYSTECTOMY    . CORONARY STENT INTERVENTION N/A 02/23/2019   Procedure: CORONARY STENT INTERVENTION;  Surgeon: Yvonne Kendall, MD;  Location: [facility] INVASIVE CV LAB;  Service: Cardiovascular;  Laterality: N/A;  . ESOPHAGOGASTRODUODENOSCOPY N/A 12/26/2014   Procedure: ESOPHAGOGASTRODUODENOSCOPY (EGD);  Surgeon: Midge Minium, MD;  Location[facility] ENDOSCOPY;  Service: Endoscopy;  Laterality: N/A;  . FRACTURE SURGERY    . LEFT HEART CATH AND CORONARY ANGIOGRAPHY N/A 02/23/2019   Procedure: LEFT HEART CATH AND CORONARY ANGIOGRAPHY;  Surgeon: Antonieta Iba, MD;  Location: [facility] INVASIVE CV LAB;  Service: Cardiovascular;  Laterality: N/A;  . LEFT HEART CATH AND CORONARY ANGIOGRAPHY N/A 06/06/2019   Procedure: LEFT HEART CATH AND CORONARY ANGIOGRAPHY;  Surgeon: Iran Ouch, MD;  Location: [facility] INVASIVE CV LAB;  Service: Cardiovascular;  Laterality: N/A;  . NECK SURGERY    . TUBAL LIGATION    . WISDOM  TOOTH EXTRACTION      No outpatient medications have been marked as taking for the 11/21/19 encounter (Appointment) with Rory Xiang, Cristal Deer, MD.    Allergies: Almond oil, Atorvastatin, and Rosuvastatin  Social History   Tobacco Use  . Smoking status: Former Smoker    Packs/day: 0.50     Years: 38.00    Pack years: 19.00    Types: Cigarettes    Quit date: 11/14/2012    Years since quitting: 7.0  . Smokeless tobacco: Never Used  Substance Use Topics  . Alcohol use: Not Currently    Comment: occ  . Drug use: No    Family History  Problem Relation Age of Onset  . CAD Father   . Alzheimer's disease Other   . Breast cancer Paternal Aunt        another aunt- Polycythemia vera.   . Breast cancer Cousin   . Lung cancer Paternal Uncle     Review of Systems: A 12-system review of systems was performed and was negative except as noted in the HPI.  --------------------------------------------------------------------------------------------------  Physical Exam: There were no vitals taken for this visit.  General:  *** HEENT: No conjunctival pallor or scleral icterus. Facemask in place. Neck: Supple without lymphadenopathy, thyromegaly, JVD, or HJR. Lungs: Normal work of breathing. Clear to auscultation bilaterally without wheezes or crackles. Heart: Regular rate and rhythm without murmurs, rubs, or gallops. Non-displaced PMI. Abd: Bowel sounds present. Soft, NT/ND without hepatosplenomegaly Ext: No lower extremity edema. Radial, PT, and DP pulses are 2+ bilaterally. Skin: Warm and dry without rash.  EKG:  ***  Lab Results  Component Value Date   WBC 10.0 11/02/2019   HGB 10.1 (L) 11/02/2019   HCT 34.4 (L) 11/02/2019   MCV 70.9 (L) 11/02/2019   PLT 475 (H) 11/02/2019    Lab Results  Component Value Date   NA 137 11/02/2019   K 3.1 (L) 11/02/2019   CL 101 11/02/2019   CO2 24 11/02/2019   BUN 21 11/02/2019   CREATININE 1.16 (H) 11/02/2019   GLUCOSE 177 (H) 11/02/2019   ALT 25 11/02/2019    Lab Results  Component Value Date   CHOL 290 (H) 06/07/2019   HDL 40 (L) 06/07/2019   LDLCALC 178 (H) 06/07/2019   TRIG 360 (H) 06/07/2019   CHOLHDL 7.3 06/07/2019     --------------------------------------------------------------------------------------------------  ASSESSMENT AND PLAN: Yvonne Kendall, MD 11/21/2019 7:16 AM

## 2019-11-21 NOTE — Telephone Encounter (Signed)
Patient called to inquiry if she needs any fasting labs  At the next apt. I returned patient's phone call and explained that at this time she would not need any additional lab studies on the day of her apt with Dr. Donneta Romberg. She states that she wanted to go to the store and find some oral iron supplements to try given her ferritin level was low. She didn't know where the iron was located in the vitamin aisle. I explained to her that the iron would be under ferrous sulfate and she could ask the pharmacist to help her find it on the shelf. She thanked me for following up with her.

## 2019-11-22 ENCOUNTER — Other Ambulatory Visit: Payer: Self-pay | Admitting: Family Medicine

## 2019-11-26 ENCOUNTER — Other Ambulatory Visit: Payer: Self-pay | Admitting: Family Medicine

## 2019-12-05 ENCOUNTER — Inpatient Hospital Stay: Payer: 59 | Attending: Internal Medicine | Admitting: Internal Medicine

## 2019-12-05 NOTE — Assessment & Plan Note (Deleted)
Hb 9-12.  MCV -76; the etiology is unclear-iron deficiency/CKD stage III versus anemia of chronic disease/rheumatoid arthritis.  recommend checking CBC CMP LDH ferritin; iron studies; peripheral smear kappa lambda light chain; myeloma panel.  Reticulocyte count.  CRP.  # DM-2-[poorly controlled August 2021 A1c 7.5]-patient not on oral hypoglycemics.  Recommend follow-up with PCP regarding restarting diabetic medication.  # CKD-III. unclear etiology will need kidney ultrasound.  # smoking- Discussed with the patient regarding the ill effects of smoking- including but not limited to cardiac lung and vascular diseases and malignancies.  Discuss lung cancer screening program.  Thank you Dr.Niemeyer MD for allowing me to participate in the care of your pleasant patient. Please do not hesitate to contact me with questions or concerns in the interim.   # DISPOSITION: # labs today # follow up in 7- 10 days- MD; no labs- Dr.B  Cc; PCP.

## 2019-12-05 NOTE — Progress Notes (Deleted)
[facility] CONSULT NOTE  Patient Care Team: Evelene Croon, MD as PCP - General (Family Medicine) End, Cristal Deer, MD as PCP - Cardiology (Cardiology)  CHIEF COMPLAINTS/PURPOSE OF CONSULTATION:    HEMATOLOGY HISTORY  # ANEMIA Hb- 8.7 [PCP; ]-; colonoscopy/EGD [Dr.Oh; 2016-2017]  # DM-2 [HbA1c- 7.9- Aug 2021]- not on meds; CKD- III [GFR 35- AUG 2021]  HISTORY OF PRESENTING ILLNESS:  Whitney Bernard 59 y.o.  female has been referred to [place] for further evaluation/work-up for anemia.  Patient complains of worsening fatigue over the last many months.   Blood in stools: None Change in bowel habits- None Blood in urine: None Difficulty swallowing: intermittent difficulty swallowing solids/ not any worse.  Abnormal weight loss: None Iron supplementation:on PO iron.  Prior Blood transfusions: none Vaginal bleeding: None  Review of Systems  Constitutional: Positive for malaise/fatigue. Negative for chills, diaphoresis, fever and weight loss.  HENT: Negative for nosebleeds and sore throat.   Eyes: Negative for double vision.  Respiratory: Negative for cough, hemoptysis, sputum production, shortness of breath and wheezing.   Cardiovascular: Negative for chest pain, palpitations, orthopnea and leg swelling.  Gastrointestinal: Negative for abdominal pain, blood in stool, constipation, diarrhea, heartburn, melena, nausea and vomiting.  Genitourinary: Negative for dysuria, frequency and urgency.  Musculoskeletal: Positive for back pain and joint pain.  Skin: Negative.  Negative for itching and rash.  Neurological: Negative for dizziness, tingling, focal weakness, weakness and headaches.  Endo/Heme/Allergies: Does not bruise/bleed easily.  Psychiatric/Behavioral: Negative for depression. The patient is not nervous/anxious and does not have insomnia.     MEDICAL HISTORY:  Past Medical History:  Diagnosis Date  . Acute pancreatitis   . CAD (coronary artery disease)    a.  08/2017 Neg MV; b. 01/2019 CTA Chest: Mild cor Ca2+; c. 02/2019 NSTEMI/PCI: LCX 95p/m (2.75x15 Resolute Onyx DES); d. 05/2019 Cath: LM nl, LAD nl, LCX patent stent, RCA 30ost/p-->Med rx.  . Chronic pain syndrome   . CKD (chronic kidney disease), stage III   . COPD (chronic obstructive pulmonary disease) (HCC)   . Depression   . Diabetes mellitus without complication (HCC)   . Headache(784.0)    migraines  . History of echocardiogram    a. 04/2018 Echo: EF 55-60%. No significant valvular dzs; b. 02/2019 Echo: EF 60-65%. Nl RV fxn.  . Hypercholesteremia   . Hypertension   . Hyperthyroidism   . Hypokalemia   . Migraine   . Narcotic abuse (HCC)   . RA (rheumatoid arthritis) (HCC)     SURGICAL HISTORY: Past Surgical History:  Procedure Laterality Date  . ANTERIOR CERVICAL DECOMP/DISCECTOMY FUSION N/A 11/26/2012   Procedure: ANTERIOR CERVICAL DECOMPRESSION/DISCECTOMY FUSION 2 LEVELS;  Surgeon: Reinaldo Meeker, MD;  Location: [facility];  Service: Neurosurgery;  Laterality: N/A;  ANTERIOR CERVICAL DECOMPRESSION/DISCECTOMY FUSION 2 LEVELS  . CERVICAL POLYPECTOMY    . CHOLECYSTECTOMY    . CORONARY STENT INTERVENTION N/A 02/23/2019   Procedure: CORONARY STENT INTERVENTION;  Surgeon: Yvonne Kendall, MD;  Location: [facility] INVASIVE CV LAB;  Service: Cardiovascular;  Laterality: N/A;  . ESOPHAGOGASTRODUODENOSCOPY N/A 12/26/2014   Procedure: ESOPHAGOGASTRODUODENOSCOPY (EGD);  Surgeon: Midge Minium, MD;  Location: [facility] ENDOSCOPY;  Service: Endoscopy;  Laterality: N/A;  . FRACTURE SURGERY    . LEFT HEART CATH AND CORONARY ANGIOGRAPHY N/A 02/23/2019   Procedure: LEFT HEART CATH AND CORONARY ANGIOGRAPHY;  Surgeon: Antonieta Iba, MD;  Location: [facility] INVASIVE CV LAB;  Service: Cardiovascular;  Laterality: N/A;  . LEFT HEART CATH AND CORONARY  ANGIOGRAPHY N/A 06/06/2019   Procedure: LEFT HEART CATH AND CORONARY ANGIOGRAPHY;  Surgeon: Iran Ouch, MD;  Location: [facility] INVASIVE CV LAB;  Service:  Cardiovascular;  Laterality: N/A;  . NECK SURGERY    . TUBAL LIGATION    . WISDOM TOOTH EXTRACTION      SOCIAL HISTORY: Social History   Socioeconomic History  . Marital status: Married    Spouse name: Not on file  . Number of children: Not on file  . Years of education: Not on file  . Highest education level: Not on file  Occupational History  . Not on file  Tobacco Use  . Smoking status: Former Smoker    Packs/day: 0.50    Years: 38.00    Pack years: 19.00    Types: Cigarettes    Quit date: 11/14/2012    Years since quitting: 7.0  . Smokeless tobacco: Never Used  Substance and Sexual Activity  . Alcohol use: Not Currently    Comment: occ  . Drug use: No  . Sexual activity: Yes    Birth control/protection: Surgical  Other Topics Concern  . Not on file  Social History Narrative   In [place] with husbands; and 2 sons. 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 Smoke- quitting; no alcohol.    Social Determinants of Health   Financial Resource Strain:   . Difficulty of Paying Living Expenses: Not on file  Food Insecurity:   . Worried About Programme researcher, broadcasting/film/video in the Last Year: Not on file  . Ran Out of Food in the Last Year: Not on file  Transportation Needs:   . Lack of Transportation (Medical): Not on file  . Lack of Transportation (Non-Medical): Not on file  Physical Activity:   . Days of Exercise per Week: Not on file  . Minutes of Exercise per Session: Not on file  Stress:   . Feeling of Stress : Not on file  Social Connections:   . Frequency of Communication with Friends and Family: Not on file  . Frequency of Social Gatherings with Friends and Family: Not on file  . Attends Religious Services: Not on file  . Active Member of Clubs or Organizations: Not on file  . Attends Banker Meetings: Not on file  . Marital Status: Not on file  Intimate Partner Violence:   . Fear of Current or Ex-Partner: Not on file  . Emotionally Abused: Not on file  . Physically  Abused: Not on file  . Sexually Abused: Not on file    FAMILY HISTORY: Family History  Problem Relation Age of Onset  . CAD Father   . Alzheimer's disease Other   . Breast cancer Paternal Aunt        another aunt- Polycythemia vera.   . Breast cancer Cousin   . Lung cancer Paternal Uncle     ALLERGIES:  is allergic to almond oil, atorvastatin, and rosuvastatin.  MEDICATIONS:  Current Outpatient Medications  Medication Sig Dispense Refill  . aspirin EC 81 MG EC tablet Take 1 tablet (81 mg total) by mouth daily.    . busPIRone (BUSPAR) 10 MG tablet Take 10 mg by mouth 2 (two) times daily.     . Butalbital-APAP-Caffeine 50-300-40 MG CAPS Take by mouth 3 (three) times daily as needed for headache.  (Patient not taking: Reported on 11/02/2019)    . carvedilol (COREG) 3.125 MG tablet Take 1 tablet (3.125 mg total) by mouth 2 (two) times daily. 60 tablet 6  . clopidogrel (  PLAVIX) 75 MG tablet Take 1 tablet (75 mg total) by mouth daily. 90 tablet 3  . ezetimibe (ZETIA) 10 MG tablet Take 1 tablet (10 mg total) by mouth daily. 60 tablet 0  . furosemide (LASIX) 40 MG tablet Take 40 mg by mouth 2 (two) times daily.     Marland Kitchen omeprazole (PRILOSEC) 20 MG capsule Take 20 mg by mouth daily.    Marland Kitchen PARoxetine (PAXIL) 40 MG tablet Take 40 mg by mouth daily.    . potassium chloride SA (KLOR-CON) 20 MEQ tablet Take 1 tablet (20 mEq total) by mouth 2 (two) times daily.     No current facility-administered medications for this visit.      PHYSICAL EXAMINATION:   There were no vitals filed for this visit. There were no vitals filed for this visit.  Physical Exam HENT:     Head: Normocephalic and atraumatic.     Mouth/Throat:     Pharynx: No oropharyngeal exudate.  Eyes:     Pupils: Pupils are equal, round, and reactive to light.  Cardiovascular:     Rate and Rhythm: Normal rate and regular rhythm.  Pulmonary:     Effort: Pulmonary effort is normal. No respiratory distress.     Breath sounds:  Normal breath sounds. No wheezing.  Abdominal:     General: Bowel sounds are normal. There is no distension.     Palpations: Abdomen is soft. There is no mass.     Tenderness: There is no abdominal tenderness. There is no guarding or rebound.  Musculoskeletal:        General: No tenderness. Normal range of motion.     Cervical back: Normal range of motion and neck supple.  Skin:    General: Skin is warm.  Neurological:     Mental Status: She is alert and oriented to person, place, and time.  Psychiatric:        Mood and Affect: Affect normal.     LABORATORY DATA:  I have reviewed the data as listed Lab Results  Component Value Date   WBC 10.0 11/02/2019   HGB 10.1 (L) 11/02/2019   HCT 34.4 (L) 11/02/2019   MCV 70.9 (L) 11/02/2019   PLT 475 (H) 11/02/2019   Recent Labs    02/25/19 0655 02/25/19 0655 06/06/19 0537 06/06/19 0537 06/07/19 0416 08/31/19 0702 11/02/19 1213  NA 139   < > 138   < > 142 139 137  K 3.8   < > 3.9   < > 5.0 4.0 3.1*  CL 105   < > 109   < > 114* 105 101  CO2 25   < > 21*   < > 21* 25 24  GLUCOSE 126*   < > 163*   < > 111* 146* 177*  BUN 17   < > 26*   < > 17 14 21   CREATININE 1.30*   < > 1.38*   < > 1.15* 1.26* 1.16*  CALCIUM 8.4*   < > 9.0   < > 9.4 9.3 9.2  GFRNONAA 45*   < > 41*   < > 51* 46* 51*  GFRAA 52*   < > 48*   < > 59* 53* 59*  PROT 6.4*  --  7.4  --   --   --  8.2*  ALBUMIN 3.1*  --  3.7  --   --   --  4.3  AST 17  --  14*  --   --   --  24  ALT 21  --  16  --   --   --  25  ALKPHOS 69  --  106  --   --   --  127*  BILITOT 0.6  --  0.6  --   --   --  0.4  BILIDIR  --   --  <0.1  --   --   --   --   IBILI  --   --  NOT CALCULATED  --   --   --   --    < > = values in this interval not displayed.     No results found.  Microcytic anemia Hb 9-12.  MCV -76; the etiology is unclear-iron deficiency/CKD stage III versus anemia of chronic disease/rheumatoid arthritis.  recommend checking CBC CMP LDH ferritin; iron studies;  peripheral smear kappa lambda light chain; myeloma panel.  Reticulocyte count.  CRP.  # DM-2-[poorly controlled August 2021 A1c 7.5]-patient not on oral hypoglycemics.  Recommend follow-up with PCP regarding restarting diabetic medication.  # CKD-III. unclear etiology will need kidney ultrasound.  # smoking- Discussed with the patient regarding the ill effects of smoking- including but not limited to cardiac lung and vascular diseases and malignancies.  Discuss lung cancer screening program.  Thank you Dr.Niemeyer MD for allowing me to participate in the care of your pleasant patient. Please do not hesitate to contact me with questions or concerns in the interim.   # DISPOSITION: # labs today # follow up in 7- 10 days- MD; no labs- Dr.B  Cc; PCP.   All questions were answered. The patient knows to call the clinic with any problems, questions or concerns.   Whitney Coder, MD 12/05/2019 8:30 AM

## 2019-12-22 ENCOUNTER — Other Ambulatory Visit: Payer: Self-pay | Admitting: Family Medicine

## 2019-12-23 ENCOUNTER — Other Ambulatory Visit: Payer: Self-pay | Admitting: Family Medicine

## 2020-03-13 ENCOUNTER — Other Ambulatory Visit: Payer: Self-pay | Admitting: Family Medicine

## 2020-03-13 DIAGNOSIS — E876 Hypokalemia: Secondary | ICD-10-CM | POA: Diagnosis not present

## 2020-03-13 DIAGNOSIS — F39 Unspecified mood [affective] disorder: Secondary | ICD-10-CM | POA: Diagnosis not present

## 2020-03-13 DIAGNOSIS — I10 Essential (primary) hypertension: Secondary | ICD-10-CM | POA: Diagnosis not present

## 2020-03-13 DIAGNOSIS — F419 Anxiety disorder, unspecified: Secondary | ICD-10-CM | POA: Diagnosis not present

## 2020-03-13 DIAGNOSIS — E118 Type 2 diabetes mellitus with unspecified complications: Secondary | ICD-10-CM | POA: Diagnosis not present

## 2020-03-13 DIAGNOSIS — E782 Mixed hyperlipidemia: Secondary | ICD-10-CM | POA: Diagnosis not present

## 2020-03-26 ENCOUNTER — Other Ambulatory Visit: Payer: Self-pay

## 2020-03-26 ENCOUNTER — Emergency Department: Payer: 59

## 2020-03-26 ENCOUNTER — Emergency Department
Admission: EM | Admit: 2020-03-26 | Discharge: 2020-03-26 | Disposition: A | Discharge status: Home / Self Care | Payer: 59 | Attending: Student in an Organized Health Care Education/Training Program | Admitting: Student in an Organized Health Care Education/Training Program

## 2020-03-26 DIAGNOSIS — Z87891 Personal history of nicotine dependence: Secondary | ICD-10-CM | POA: Insufficient documentation

## 2020-03-26 DIAGNOSIS — I2510 Atherosclerotic heart disease of native coronary artery without angina pectoris: Secondary | ICD-10-CM | POA: Insufficient documentation

## 2020-03-26 DIAGNOSIS — S52611A Displaced fracture of right ulna styloid process, initial encounter for closed fracture: Secondary | ICD-10-CM | POA: Diagnosis not present

## 2020-03-26 DIAGNOSIS — Z7901 Long term (current) use of anticoagulants: Secondary | ICD-10-CM | POA: Insufficient documentation

## 2020-03-26 DIAGNOSIS — N1831 Chronic kidney disease, stage 3a: Secondary | ICD-10-CM | POA: Diagnosis not present

## 2020-03-26 DIAGNOSIS — Y92009 Unspecified place in unspecified non-institutional (private) residence as the place of occurrence of the external cause: Secondary | ICD-10-CM | POA: Diagnosis not present

## 2020-03-26 DIAGNOSIS — Z7982 Long term (current) use of aspirin: Secondary | ICD-10-CM | POA: Insufficient documentation

## 2020-03-26 DIAGNOSIS — W010XXA Fall on same level from slipping, tripping and stumbling without subsequent striking against object, initial encounter: Secondary | ICD-10-CM | POA: Diagnosis not present

## 2020-03-26 DIAGNOSIS — S52501A Unspecified fracture of the lower end of right radius, initial encounter for closed fracture: Secondary | ICD-10-CM | POA: Insufficient documentation

## 2020-03-26 DIAGNOSIS — I129 Hypertensive chronic kidney disease with stage 1 through stage 4 chronic kidney disease, or unspecified chronic kidney disease: Secondary | ICD-10-CM | POA: Insufficient documentation

## 2020-03-26 DIAGNOSIS — Z79899 Other long term (current) drug therapy: Secondary | ICD-10-CM | POA: Diagnosis not present

## 2020-03-26 DIAGNOSIS — E1122 Type 2 diabetes mellitus with diabetic chronic kidney disease: Secondary | ICD-10-CM | POA: Insufficient documentation

## 2020-03-26 DIAGNOSIS — J449 Chronic obstructive pulmonary disease, unspecified: Secondary | ICD-10-CM | POA: Insufficient documentation

## 2020-03-26 DIAGNOSIS — R000 Tachycardia, unspecified: Secondary | ICD-10-CM | POA: Diagnosis not present

## 2020-03-26 DIAGNOSIS — I10 Essential (primary) hypertension: Secondary | ICD-10-CM | POA: Diagnosis not present

## 2020-03-26 DIAGNOSIS — S6991XA Unspecified injury of right wrist, hand and finger(s), initial encounter: Secondary | ICD-10-CM | POA: Diagnosis present

## 2020-03-26 DIAGNOSIS — M25539 Pain in unspecified wrist: Secondary | ICD-10-CM | POA: Diagnosis not present

## 2020-03-26 DIAGNOSIS — R0902 Hypoxemia: Secondary | ICD-10-CM | POA: Diagnosis not present

## 2020-03-26 MED ORDER — OXYCODONE-ACETAMINOPHEN 5-325 MG PO TABS: 1.0000 | ORAL_TABLET | ORAL | 0 refills | Status: AC | PRN

## 2020-03-26 MED ORDER — MORPHINE SULFATE (PF) 4 MG/ML IV SOLN
4.0000 mg | INTRAVENOUS | Status: DC | PRN
  Administered 2020-03-26: 4 mg via INTRAMUSCULAR

## 2020-03-26 MED ORDER — MORPHINE SULFATE (PF) 4 MG/ML IV SOLN
INTRAVENOUS | Status: AC
  Filled 2020-03-26: qty 1

## 2020-03-26 MED ORDER — OXYCODONE-ACETAMINOPHEN 5-325 MG PO TABS
1.0000 | ORAL_TABLET | Freq: Once | ORAL | Status: AC
  Administered 2020-03-26: 1 via ORAL
  Filled 2020-03-26: qty 1

## 2020-03-26 NOTE — ED Triage Notes (Signed)
Pt comes via EMS from home with c/o right wrist pain. Pt states she tripped over her dog and fell trying to catch herself with he right arm  Pt states obvious deformity and pain with some swelling. Splint in place by EMS at  This time.

## 2020-03-26 NOTE — ED Triage Notes (Signed)
Pt comes into the ED via EMS from home with c/o tripped over the dog and having right wrist pain, denies any other injury.Whitney Bernard

## 2020-03-26 NOTE — ED Provider Notes (Signed)
[facility] Emergency Department Provider Note    Event Date/Time   First MD Initiated Contact with Patient 03/26/20 1925     (approximate)  I have reviewed the triage vital signs and the nursing notes.   HISTORY  Chief Complaint Wrist Pain    HPI Whitney Bernard is a 60 y.o. female presents to the ER for evaluation of right wrist pain that occurred after she fell at home tripping and falling over her dog.  Did not hit her head.  She catch her fall with her right extended hand.  Does have moderate to severe right hand pain.  No numbness or tingling.  No other associated injury.    Past Medical History:  Diagnosis Date  . Acute pancreatitis   . CAD (coronary artery disease)    a. 08/2017 Neg MV; b. 01/2019 CTA Chest: Mild cor Ca2+; c. 02/2019 NSTEMI/PCI: LCX 95p/m (2.75x15 Resolute Onyx DES); d. 05/2019 Cath: LM nl, LAD nl, LCX patent stent, RCA 30ost/p-->Med rx.  . Chronic pain syndrome   . CKD (chronic kidney disease), stage III (HCC)   . COPD (chronic obstructive pulmonary disease) (HCC)   . Depression   . Diabetes mellitus without complication (HCC)   . Headache(784.0)    migraines  . History of echocardiogram    a. 04/2018 Echo: EF 55-60%. No significant valvular dzs; b. 02/2019 Echo: EF 60-65%. Nl RV fxn.  . Hypercholesteremia   . Hypertension   . Hyperthyroidism   . Hypokalemia   . Migraine   . Narcotic abuse (HCC)   . RA (rheumatoid arthritis) (HCC)    Family History  Problem Relation Age of Onset  . CAD Father   . Alzheimer's disease Other   . Breast cancer Paternal Aunt        another aunt- Polycythemia vera.   . Breast cancer Cousin   . Lung cancer Paternal Uncle    Past Surgical History:  Procedure Laterality Date  . ANTERIOR CERVICAL DECOMP/DISCECTOMY FUSION N/A 11/26/2012   Procedure: ANTERIOR CERVICAL DECOMPRESSION/DISCECTOMY FUSION 2 LEVELS;  Surgeon: Reinaldo Meeker, MD;  Location: [facility] NEURO ORS;  Service: Neurosurgery;   Laterality: N/A;  ANTERIOR CERVICAL DECOMPRESSION/DISCECTOMY FUSION 2 LEVELS  . CERVICAL POLYPECTOMY    . CHOLECYSTECTOMY    . CORONARY STENT INTERVENTION N/A 02/23/2019   Procedure: CORONARY STENT INTERVENTION;  Surgeon: Yvonne Kendall, MD;  Location: [facility] INVASIVE CV LAB;  Service: Cardiovascular;  Laterality: N/A;  . ESOPHAGOGASTRODUODENOSCOPY N/A 12/26/2014   Procedure: ESOPHAGOGASTRODUODENOSCOPY (EGD);  Surgeon: Midge Minium, MD;  Location: [facility] ENDOSCOPY;  Service: Endoscopy;  Laterality: N/A;  . FRACTURE SURGERY    . LEFT HEART CATH AND CORONARY ANGIOGRAPHY N/A 02/23/2019   Procedure: LEFT HEART CATH AND CORONARY ANGIOGRAPHY;  Surgeon: Antonieta Iba, MD;  Location: [facility] INVASIVE CV LAB;  Service: Cardiovascular;  Laterality: N/A;  . LEFT HEART CATH AND CORONARY ANGIOGRAPHY N/A 06/06/2019   Procedure: LEFT HEART CATH AND CORONARY ANGIOGRAPHY;  Surgeon: Iran Ouch, MD;  Location: [facility] INVASIVE CV LAB;  Service: Cardiovascular;  Laterality: N/A;  . NECK SURGERY    . TUBAL LIGATION    . WISDOM TOOTH EXTRACTION     Patient Active Problem List   Diagnosis Date Noted  . Microcytic anemia 11/02/2019  . CAD (coronary artery disease) 06/06/2019  . Depression 06/06/2019  . Unstable angina (HCC)   . COPD (chronic obstructive pulmonary disease) (HCC)   . Hypertension   . CKD (chronic kidney disease), stage IIIa   .  Chest pain, unspecified   . NSTEMI (non-ST elevated myocardial infarction) (HCC)   . Hyperkalemia   . Elevated troponin   . GERD (gastroesophageal reflux disease)   . Type II diabetes mellitus with renal manifestations (HCC)   . Chest pain, non-cardiac 08/23/2017  . Protein-calorie malnutrition, severe 12/26/2014  . 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 Loose stools   . Diarrhea   . Hypokalemia 12/24/2014  . BP (high blood pressure) 02/23/2013  . Misuse of prescription only drugs 02/23/2013  . Depression with anxiety 06/22/2012  . Colitis 05/11/2012  . Clinical  depression 05/11/2012  . Hypercholesterolemia 05/11/2012  . Gastroduodenal ulcer 03/23/2012      Prior to Admission medications   Medication Sig Start Date End Date Taking? Authorizing Provider  oxyCODONE-acetaminophen (PERCOCET) 5-325 MG tablet Take 1 tablet by mouth every 4 (four) hours as needed for severe pain. 03/26/20 03/26/21 Yes Willy Eddy, MD  aspirin EC 81 MG EC tablet Take 1 tablet (81 mg total) by mouth daily. 06/07/19   Lurene Shadow, MD  busPIRone (BUSPAR) 10 MG tablet Take 10 mg by mouth 2 (two) times daily.  04/26/18   [provider]  Butalbital-APAP-Caffeine 50-300-40 MG CAPS Take by mouth 3 (three) times daily as needed for headache.  Patient not taking: Reported on 11/02/2019 04/26/18   [provider]  carvedilol (COREG) 3.125 MG tablet Take 1 tablet (3.125 mg total) by mouth 2 (two) times daily. 10/19/19   Creig Hines, NP  clopidogrel (PLAVIX) 75 MG tablet Take 1 tablet (75 mg total) by mouth daily. 10/19/19   Creig Hines, NP  ezetimibe (ZETIA) 10 MG tablet Take 1 tablet (10 mg total) by mouth daily. 02/26/19   Amin, Loura Halt, MD  furosemide (LASIX) 40 MG tablet Take 40 mg by mouth 2 (two) times daily.     [provider]  omeprazole (PRILOSEC) 20 MG capsule Take 20 mg by mouth daily.    [provider]  PARoxetine (PAXIL) 40 MG tablet Take 40 mg by mouth daily.    [provider]  potassium chloride SA (KLOR-CON) 20 MEQ tablet Take 1 tablet (20 mEq total) by mouth 2 (two) times daily. 06/07/19   Lurene Shadow, MD    Allergies Almond oil, Atorvastatin, and Rosuvastatin    Social History Social History   Tobacco Use  . Smoking status: Former Smoker    Packs/day: 0.50    Years: 38.00    Pack years: 19.00    Types: Cigarettes    Quit date: 11/14/2012    Years since quitting: 7.3  . Smokeless tobacco: Never Used  Substance Use Topics  . Alcohol use: Not Currently    Comment: occ  . Drug  use: No    Review of Systems Patient denies headaches, rhinorrhea, blurry vision, numbness, shortness of breath, chest pain, edema, cough, abdominal pain, nausea, vomiting, diarrhea, dysuria, fevers, rashes or hallucinations unless otherwise stated above in HPI. ____________________________________________   PHYSICAL EXAM:  VITAL SIGNS: Vitals:   03/26/20 1819  BP: (!) 167/102  Pulse: (!) 102  Resp: 17  Temp: 98.5 F (36.9 C)  SpO2: 95%    Constitutional: Alert and oriented.  Eyes: Conjunctivae are normal.  Head: Atraumatic. Nose: No congestion/rhinnorhea. Mouth/Throat: Mucous membranes are moist.   Neck: No stridor. Painless ROM.  Cardiovascular: Normal rate, regular rhythm. Grossly normal heart sounds.  Good peripheral circulation. Respiratory: Normal respiratory effort.  No retractions. Lungs CTAB. Gastrointestinal: Soft and nontender. No distention. No abdominal  bruits. No CVA tenderness. Genitourinary:  Musculoskeletal: No lower extremity tenderness nor edema.  No joint effusions.  Right upper extremity with dinner fork deformity of the right wrist.  Neurovascularly intact distally.  Does have an abrasion of the elbow but no osseous pain.  No other associated injury noted. Neurologic:  Normal speech and language. No gross focal neurologic deficits are appreciated. No facial droop Skin:  Skin is warm, dry and intact. No rash noted. Psychiatric: Mood and affect are normal. Speech and behavior are normal.  ____________________________________________   LABS (all labs ordered are listed, but only abnormal results are displayed)  No results found for this or any previous visit (from the past 24 hour(s)). ____________________________________________ ____________________________________________  RADIOLOGY  I personally reviewed all radiographic images ordered to evaluate for the above acute complaints and reviewed radiology reports and findings.  These findings were  personally discussed with the patient.  Please see medical record for radiology report.  ____________________________________________   PROCEDURES  Procedure(s) performed:  .Ortho Injury Treatment  Date/Time: 03/26/2020 8:16 PM Performed by: Willy Eddy, MD Authorized by: Willy Eddy, MD   Consent:    Consent obtained:  Verbal   Consent given by:  PatientInjury location: wrist Location details: right wrist Injury type: fracture Fracture type: distal radius and ulnar styloid Pre-procedure neurovascular assessment: neurovascularly intact Immobilization: splint Splint type: sugar tong Splint Applied by: ED Provider Supplies used: Ortho-Glass Post-procedure neurovascular assessment: post-procedure neurovascularly intact       Critical Care performed: no ____________________________________________   INITIAL IMPRESSION / ASSESSMENT AND PLAN / ED COURSE  Pertinent labs & imaging results that were available during my care of the patient were reviewed by me and considered in my medical decision making (see chart for details).   DDX: Fracture, contusion, dislocation  Whitney Bernard is a 60 y.o. who presents to the ED with evidence of right wrist injury as described above with evidence of comminuted and dorsally angulated fracture of the distal radius.  Also with evidence of ulnar styloid fracture.  Case discussed in consultation with Dr. Dartha Lodge and patient will need operative management.  Will be splinted in place given pain medication and referral for close orthopedic follow-up.  Have discussed with the patient and available family all diagnostics and treatments performed thus far and all questions were answered to the best of my ability. The patient demonstrates understanding and agreement with plan.      The patient was evaluated in Emergency Department today for the symptoms described in the history of present illness. He/she was evaluated in the context of the  global COVID-19 pandemic, which necessitated consideration that the patient might be at risk for infection with the SARS-CoV-2 virus that causes COVID-19. Institutional protocols and algorithms that pertain to the evaluation of patients at risk for COVID-19 are in a state of rapid change based on information released by regulatory bodies including the CDC and federal and state organizations. These policies and algorithms were followed during the patient's care in the ED.  As part of my medical decision making, I reviewed the following data within the electronic MEDICAL RECORD NUMBER Nursing notes reviewed and incorporated, Labs reviewed, notes from prior ED visits and [place] Controlled Substance Database   ____________________________________________   FINAL CLINICAL IMPRESSION(S) / ED DIAGNOSES  Final diagnoses:  Closed fracture of distal end of right radius, unspecified fracture morphology, initial encounter      NEW MEDICATIONS STARTED DURING THIS VISIT:  New Prescriptions   OXYCODONE-ACETAMINOPHEN (PERCOCET) 5-325 MG  TABLET    Take 1 tablet by mouth every 4 (four) hours as needed for severe pain.     Note:  This document was prepared using Dragon voice recognition software and may include unintentional dictation errors.    Willy Eddy, MD 03/26/20 2016

## 2020-03-27 ENCOUNTER — Other Ambulatory Visit: Payer: Self-pay | Admitting: Surgery

## 2020-03-27 ENCOUNTER — Other Ambulatory Visit
Admission: RE | Admit: 2020-03-27 | Discharge: 2020-03-27 | Disposition: A | Discharge status: Home / Self Care | Payer: 59 | Source: Ambulatory Visit | Attending: Surgery | Admitting: Surgery

## 2020-03-27 DIAGNOSIS — Z01812 Encounter for preprocedural laboratory examination: Secondary | ICD-10-CM | POA: Insufficient documentation

## 2020-03-27 DIAGNOSIS — Z20822 Contact with and (suspected) exposure to covid-19: Secondary | ICD-10-CM | POA: Diagnosis not present

## 2020-03-27 DIAGNOSIS — S52531A Colles' fracture of right radius, initial encounter for closed fracture: Secondary | ICD-10-CM | POA: Insufficient documentation

## 2020-03-28 ENCOUNTER — Encounter: Payer: Self-pay | Admitting: Surgery

## 2020-03-28 ENCOUNTER — Ambulatory Visit: Payer: 59

## 2020-03-28 ENCOUNTER — Ambulatory Visit: Payer: 59 | Admitting: Registered Nurse

## 2020-03-28 ENCOUNTER — Observation Stay
Admission: RE | Admit: 2020-03-28 | Discharge: 2020-03-29 | Disposition: A | Discharge status: Home / Self Care | Payer: 59 | Attending: Internal Medicine | Admitting: Internal Medicine

## 2020-03-28 ENCOUNTER — Encounter
Admission: RE | Disposition: A | Discharge status: Home / Self Care | Payer: Self-pay | Source: Home / Self Care | Attending: Family Medicine

## 2020-03-28 ENCOUNTER — Other Ambulatory Visit: Payer: Self-pay

## 2020-03-28 DIAGNOSIS — F32A Depression, unspecified: Secondary | ICD-10-CM | POA: Insufficient documentation

## 2020-03-28 DIAGNOSIS — E785 Hyperlipidemia, unspecified: Secondary | ICD-10-CM | POA: Diagnosis not present

## 2020-03-28 DIAGNOSIS — J986 Disorders of diaphragm: Secondary | ICD-10-CM

## 2020-03-28 DIAGNOSIS — N1832 Chronic kidney disease, stage 3b: Secondary | ICD-10-CM | POA: Insufficient documentation

## 2020-03-28 DIAGNOSIS — S52551A Other extraarticular fracture of lower end of right radius, initial encounter for closed fracture: Secondary | ICD-10-CM | POA: Diagnosis not present

## 2020-03-28 DIAGNOSIS — Z87891 Personal history of nicotine dependence: Secondary | ICD-10-CM | POA: Insufficient documentation

## 2020-03-28 DIAGNOSIS — Z9114 Patient's other noncompliance with medication regimen: Secondary | ICD-10-CM | POA: Diagnosis not present

## 2020-03-28 DIAGNOSIS — J449 Chronic obstructive pulmonary disease, unspecified: Secondary | ICD-10-CM | POA: Insufficient documentation

## 2020-03-28 DIAGNOSIS — Z23 Encounter for immunization: Secondary | ICD-10-CM | POA: Diagnosis not present

## 2020-03-28 DIAGNOSIS — I129 Hypertensive chronic kidney disease with stage 1 through stage 4 chronic kidney disease, or unspecified chronic kidney disease: Secondary | ICD-10-CM | POA: Insufficient documentation

## 2020-03-28 DIAGNOSIS — K219 Gastro-esophageal reflux disease without esophagitis: Secondary | ICD-10-CM | POA: Diagnosis not present

## 2020-03-28 DIAGNOSIS — Z419 Encounter for procedure for purposes other than remedying health state, unspecified: Secondary | ICD-10-CM

## 2020-03-28 DIAGNOSIS — R000 Tachycardia, unspecified: Secondary | ICD-10-CM | POA: Diagnosis not present

## 2020-03-28 DIAGNOSIS — W548XXA Other contact with dog, initial encounter: Secondary | ICD-10-CM | POA: Diagnosis not present

## 2020-03-28 DIAGNOSIS — N1831 Chronic kidney disease, stage 3a: Secondary | ICD-10-CM | POA: Diagnosis not present

## 2020-03-28 DIAGNOSIS — I2510 Atherosclerotic heart disease of native coronary artery without angina pectoris: Secondary | ICD-10-CM | POA: Insufficient documentation

## 2020-03-28 DIAGNOSIS — Z79899 Other long term (current) drug therapy: Secondary | ICD-10-CM | POA: Insufficient documentation

## 2020-03-28 DIAGNOSIS — Z136 Encounter for screening for cardiovascular disorders: Secondary | ICD-10-CM | POA: Diagnosis not present

## 2020-03-28 DIAGNOSIS — E1122 Type 2 diabetes mellitus with diabetic chronic kidney disease: Secondary | ICD-10-CM | POA: Diagnosis not present

## 2020-03-28 DIAGNOSIS — I160 Hypertensive urgency: Secondary | ICD-10-CM | POA: Diagnosis not present

## 2020-03-28 DIAGNOSIS — F419 Anxiety disorder, unspecified: Secondary | ICD-10-CM | POA: Insufficient documentation

## 2020-03-28 DIAGNOSIS — R0902 Hypoxemia: Secondary | ICD-10-CM

## 2020-03-28 DIAGNOSIS — E7800 Pure hypercholesterolemia, unspecified: Secondary | ICD-10-CM | POA: Diagnosis not present

## 2020-03-28 DIAGNOSIS — M25531 Pain in right wrist: Secondary | ICD-10-CM | POA: Diagnosis not present

## 2020-03-28 DIAGNOSIS — G8918 Other acute postprocedural pain: Secondary | ICD-10-CM | POA: Diagnosis not present

## 2020-03-28 DIAGNOSIS — S52531A Colles' fracture of right radius, initial encounter for closed fracture: Secondary | ICD-10-CM | POA: Diagnosis not present

## 2020-03-28 HISTORY — PX: ORIF WRIST FRACTURE: SHX2133

## 2020-03-28 LAB — BASIC METABOLIC PANEL
Anion gap: 10 (ref 5–15)
BUN: 13 mg/dL (ref 8–23)
CO2: 15 mmol/L — ABNORMAL LOW (ref 22–32)
Calcium: 8.5 mg/dL — ABNORMAL LOW (ref 8.9–10.3)
Chloride: 114 mmol/L — ABNORMAL HIGH (ref 98–111)
Creatinine, Ser: 1.31 mg/dL — ABNORMAL HIGH (ref 0.44–1.00)
GFR, Estimated: 46 mL/min — ABNORMAL LOW (ref >60)
Glucose, Bld: 265 mg/dL — ABNORMAL HIGH (ref 70–99)
Potassium: 3.6 mmol/L (ref 3.5–5.1)
Sodium: 139 mmol/L (ref 135–145)

## 2020-03-28 LAB — CBC
HCT: 37.8 % (ref 36.0–46.0)
Hemoglobin: 11.7 g/dL — ABNORMAL LOW (ref 12.0–15.0)
MCH: 26.7 pg (ref 26.0–34.0)
MCHC: 31 g/dL (ref 30.0–36.0)
MCV: 86.1 fL (ref 80.0–100.0)
Platelets: 396 10*3/uL (ref 150–400)
RBC: 4.39 MIL/uL (ref 3.87–5.11)
RDW: 20.4 % — ABNORMAL HIGH (ref 11.5–15.5)
WBC: 11.7 10*3/uL — ABNORMAL HIGH (ref 4.0–10.5)
nRBC: 0 % (ref 0.0–0.2)

## 2020-03-28 LAB — MAGNESIUM: Magnesium: 1.9 mg/dL (ref 1.7–2.4)

## 2020-03-28 LAB — SARS CORONAVIRUS 2 (TAT 6-24 HRS): SARS Coronavirus 2: NEGATIVE

## 2020-03-28 LAB — TROPONIN I (HIGH SENSITIVITY): Troponin I (High Sensitivity): 4 ng/L (ref <18)

## 2020-03-28 LAB — FIBRIN DERIVATIVES D-DIMER (ARMC ONLY): Fibrin derivatives D-dimer (ARMC): 2572.85 ng/mL (FEU) — ABNORMAL HIGH (ref 0.00–499.00)

## 2020-03-28 SURGERY — OPEN REDUCTION INTERNAL FIXATION (ORIF) WRIST FRACTURE
Anesthesia: General | Site: Wrist | Laterality: Right

## 2020-03-28 MED ORDER — OXYCODONE HCL 5 MG PO TABS
5.0000 mg | ORAL_TABLET | ORAL | Status: DC | PRN
Start: 2020-03-28 — End: 2020-03-29
  Administered 2020-03-28: 5 mg via ORAL

## 2020-03-28 MED ORDER — CARVEDILOL 12.5 MG PO TABS
ORAL_TABLET | ORAL | Status: AC
  Filled 2020-03-28: qty 1

## 2020-03-28 MED ORDER — OXYCODONE HCL 5 MG PO TABS
5.0000 mg | ORAL_TABLET | Freq: Once | ORAL | Status: AC | PRN
  Administered 2020-03-28: 5 mg via ORAL

## 2020-03-28 MED ORDER — MIDAZOLAM HCL 2 MG/2ML IJ SOLN
INTRAMUSCULAR | Status: AC
  Filled 2020-03-28: qty 2

## 2020-03-28 MED ORDER — CARVEDILOL 3.125 MG PO TABS
3.1250 mg | ORAL_TABLET | Freq: Once | ORAL | Status: AC
  Administered 2020-03-28: 3.125 mg via ORAL
  Filled 2020-03-28: qty 1

## 2020-03-28 MED ORDER — MAGNESIUM HYDROXIDE 400 MG/5ML PO SUSP: 30.0000 mL | Freq: Every day | ORAL | Status: DC | PRN

## 2020-03-28 MED ORDER — BUSPIRONE HCL 10 MG PO TABS
10.0000 mg | ORAL_TABLET | Freq: Two times a day (BID) | ORAL | Status: DC
  Administered 2020-03-28: 10 mg via ORAL
  Filled 2020-03-28 (×3): qty 1

## 2020-03-28 MED ORDER — FENTANYL CITRATE (PF) 100 MCG/2ML IJ SOLN
INTRAMUSCULAR | Status: AC
  Administered 2020-03-28: 25 ug via INTRAVENOUS
  Filled 2020-03-28: qty 2

## 2020-03-28 MED ORDER — MORPHINE SULFATE (PF) 4 MG/ML IV SOLN
INTRAVENOUS | Status: AC
  Administered 2020-03-28: 2 mg via INTRAVENOUS
  Filled 2020-03-28: qty 1

## 2020-03-28 MED ORDER — KETOROLAC TROMETHAMINE 15 MG/ML IJ SOLN
15.0000 mg | Freq: Four times a day (QID) | INTRAMUSCULAR | Status: DC | PRN
  Administered 2020-03-29: 15 mg via INTRAVENOUS

## 2020-03-28 MED ORDER — FENTANYL CITRATE (PF) 100 MCG/2ML IJ SOLN
INTRAMUSCULAR | Status: AC
  Administered 2020-03-28: 50 ug via INTRAVENOUS
  Filled 2020-03-28: qty 2

## 2020-03-28 MED ORDER — POTASSIUM CHLORIDE CRYS ER 20 MEQ PO TBCR
20.0000 meq | EXTENDED_RELEASE_TABLET | Freq: Two times a day (BID) | ORAL | Status: DC
  Administered 2020-03-28: 20 meq via ORAL

## 2020-03-28 MED ORDER — CEFAZOLIN SODIUM-DEXTROSE 2-4 GM/100ML-% IV SOLN
2.0000 g | INTRAVENOUS | Status: AC
  Administered 2020-03-28: 2 g via INTRAVENOUS

## 2020-03-28 MED ORDER — ALPRAZOLAM 0.5 MG PO TABS
0.5000 mg | ORAL_TABLET | Freq: Every day | ORAL | Status: DC
  Administered 2020-03-28: 0.5 mg via ORAL

## 2020-03-28 MED ORDER — COVID-19 MRNA VAC-TRIS(PFIZER) 30 MCG/0.3ML IM SUSP
0.3000 mL | Freq: Once | INTRAMUSCULAR | Status: AC
  Administered 2020-03-29: 0.3 mL via INTRAMUSCULAR
  Filled 2020-03-28 (×2): qty 0.3

## 2020-03-28 MED ORDER — EZETIMIBE 10 MG PO TABS
10.0000 mg | ORAL_TABLET | Freq: Every day | ORAL | Status: DC
  Administered 2020-03-28: 10 mg via ORAL
  Filled 2020-03-28 (×2): qty 1

## 2020-03-28 MED ORDER — OXYCODONE HCL 5 MG/5ML PO SOLN: 5.0000 mg | Freq: Once | ORAL | Status: AC | PRN

## 2020-03-28 MED ORDER — ACETAMINOPHEN 650 MG RE SUPP: 650.0000 mg | Freq: Four times a day (QID) | RECTAL | Status: DC | PRN

## 2020-03-28 MED ORDER — MORPHINE SULFATE (PF) 4 MG/ML IV SOLN: 2.0000 mg | INTRAVENOUS | Status: DC | PRN

## 2020-03-28 MED ORDER — ONDANSETRON HCL 4 MG PO TABS: 4.0000 mg | ORAL_TABLET | Freq: Four times a day (QID) | ORAL | Status: DC | PRN

## 2020-03-28 MED ORDER — OXYCODONE-ACETAMINOPHEN 5-325 MG PO TABS
1.0000 | ORAL_TABLET | ORAL | Status: DC | PRN
Start: 2020-03-28 — End: 2020-03-29

## 2020-03-28 MED ORDER — ONDANSETRON HCL 4 MG/2ML IJ SOLN: 4.0000 mg | Freq: Four times a day (QID) | INTRAMUSCULAR | Status: DC | PRN

## 2020-03-28 MED ORDER — LIDOCAINE HCL (PF) 1 % IJ SOLN
INTRAMUSCULAR | Status: DC | PRN
  Administered 2020-03-28: 2 mL

## 2020-03-28 MED ORDER — LACTATED RINGERS IV SOLN: INTRAVENOUS | Status: DC | PRN

## 2020-03-28 MED ORDER — LABETALOL HCL 5 MG/ML IV SOLN: 20.0000 mg | INTRAVENOUS | Status: DC | PRN

## 2020-03-28 MED ORDER — ONDANSETRON HCL 4 MG/2ML IJ SOLN
INTRAMUSCULAR | Status: DC | PRN
  Administered 2020-03-28: 4 mg via INTRAVENOUS

## 2020-03-28 MED ORDER — ESMOLOL HCL 100 MG/10ML IV SOLN
INTRAVENOUS | Status: DC | PRN
  Administered 2020-03-28 (×4): 20 mg via INTRAVENOUS
  Administered 2020-03-28: 30 mg via INTRAVENOUS
  Administered 2020-03-28: 20 mg via INTRAVENOUS

## 2020-03-28 MED ORDER — ACETAMINOPHEN 325 MG PO TABS: 650.0000 mg | ORAL_TABLET | Freq: Four times a day (QID) | ORAL | Status: DC | PRN

## 2020-03-28 MED ORDER — OXYCODONE HCL 5 MG PO TABS
ORAL_TABLET | ORAL | Status: AC
  Filled 2020-03-28: qty 1

## 2020-03-28 MED ORDER — FENTANYL CITRATE (PF) 100 MCG/2ML IJ SOLN
INTRAMUSCULAR | Status: AC
  Filled 2020-03-28: qty 2

## 2020-03-28 MED ORDER — ENOXAPARIN SODIUM 40 MG/0.4ML ~~LOC~~ SOLN
40.0000 mg | SUBCUTANEOUS | Status: DC
  Administered 2020-03-28: 40 mg via SUBCUTANEOUS

## 2020-03-28 MED ORDER — PROPOFOL 10 MG/ML IV BOLUS
INTRAVENOUS | Status: DC | PRN
  Administered 2020-03-28: 100 mg via INTRAVENOUS

## 2020-03-28 MED ORDER — ENOXAPARIN SODIUM 40 MG/0.4ML ~~LOC~~ SOLN
SUBCUTANEOUS | Status: AC
  Filled 2020-03-28: qty 0.4

## 2020-03-28 MED ORDER — FENTANYL CITRATE (PF) 100 MCG/2ML IJ SOLN
INTRAMUSCULAR | Status: DC | PRN
  Administered 2020-03-28 (×2): 50 ug via INTRAVENOUS

## 2020-03-28 MED ORDER — PANTOPRAZOLE SODIUM 40 MG PO TBEC
40.0000 mg | DELAYED_RELEASE_TABLET | Freq: Every day | ORAL | Status: DC
  Administered 2020-03-28: 40 mg via ORAL
  Filled 2020-03-28 (×2): qty 1

## 2020-03-28 MED ORDER — POTASSIUM CHLORIDE IN NACL 20-0.9 MEQ/L-% IV SOLN: INTRAVENOUS | Status: DC

## 2020-03-28 MED ORDER — KETOROLAC TROMETHAMINE 15 MG/ML IJ SOLN
INTRAMUSCULAR | Status: AC
  Administered 2020-03-28: 15 mg via INTRAVENOUS
  Filled 2020-03-28: qty 1

## 2020-03-28 MED ORDER — CLOPIDOGREL BISULFATE 75 MG PO TABS
75.0000 mg | ORAL_TABLET | Freq: Every day | ORAL | Status: DC
  Administered 2020-03-28: 75 mg via ORAL
  Filled 2020-03-28 (×2): qty 1

## 2020-03-28 MED ORDER — SODIUM CHLORIDE 0.9 % IV SOLN: INTRAVENOUS | Status: DC

## 2020-03-28 MED ORDER — ROPIVACAINE HCL 5 MG/ML IJ SOLN
INTRAMUSCULAR | Status: DC | PRN
  Administered 2020-03-28 (×4): 5 mL via EPIDURAL

## 2020-03-28 MED ORDER — MIRTAZAPINE 30 MG PO TABS
30.0000 mg | ORAL_TABLET | Freq: Every day | ORAL | Status: DC
  Administered 2020-03-28: 30 mg via ORAL
  Filled 2020-03-28 (×2): qty 1

## 2020-03-28 MED ORDER — BUPIVACAINE HCL (PF) 0.5 % IJ SOLN
INTRAMUSCULAR | Status: DC | PRN
  Administered 2020-03-28: 15 mL

## 2020-03-28 MED ORDER — METOCLOPRAMIDE HCL 10 MG PO TABS: 5.0000 mg | ORAL_TABLET | Freq: Three times a day (TID) | ORAL | Status: DC | PRN

## 2020-03-28 MED ORDER — POTASSIUM CHLORIDE IN NACL 20-0.9 MEQ/L-% IV SOLN
INTRAVENOUS | Status: DC
  Filled 2020-03-28 (×4): qty 1000

## 2020-03-28 MED ORDER — DEXAMETHASONE SODIUM PHOSPHATE 10 MG/ML IJ SOLN
INTRAMUSCULAR | Status: DC | PRN
  Administered 2020-03-28: 5 mg via INTRAVENOUS

## 2020-03-28 MED ORDER — OXYCODONE-ACETAMINOPHEN 5-325 MG PO TABS
ORAL_TABLET | ORAL | Status: AC
  Administered 2020-03-28: 2 via ORAL
  Filled 2020-03-28: qty 2

## 2020-03-28 MED ORDER — ROPIVACAINE HCL 5 MG/ML IJ SOLN
INTRAMUSCULAR | Status: AC
  Filled 2020-03-28: qty 30

## 2020-03-28 MED ORDER — FENTANYL CITRATE (PF) 100 MCG/2ML IJ SOLN
25.0000 ug | INTRAMUSCULAR | Status: AC | PRN
  Administered 2020-03-28 (×4): 25 ug via INTRAVENOUS

## 2020-03-28 MED ORDER — CHLORHEXIDINE GLUCONATE 0.12 % MT SOLN
15.0000 mL | Freq: Once | OROMUCOSAL | Status: AC
  Administered 2020-03-28: 15 mL via OROMUCOSAL

## 2020-03-28 MED ORDER — CEFAZOLIN SODIUM-DEXTROSE 2-4 GM/100ML-% IV SOLN
INTRAVENOUS | Status: AC
  Filled 2020-03-28: qty 100

## 2020-03-28 MED ORDER — PAROXETINE HCL 20 MG PO TABS
40.0000 mg | ORAL_TABLET | Freq: Every day | ORAL | Status: DC
  Administered 2020-03-28: 40 mg via ORAL
  Filled 2020-03-28 (×2): qty 2

## 2020-03-28 MED ORDER — ONDANSETRON HCL 4 MG/2ML IJ SOLN: 4.0000 mg | Freq: Once | INTRAMUSCULAR | Status: DC | PRN

## 2020-03-28 MED ORDER — KETOROLAC TROMETHAMINE 30 MG/ML IJ SOLN: 30.0000 mg | Freq: Once | INTRAMUSCULAR | Status: AC

## 2020-03-28 MED ORDER — ORAL CARE MOUTH RINSE: 15.0000 mL | Freq: Once | OROMUCOSAL | Status: AC

## 2020-03-28 MED ORDER — METOCLOPRAMIDE HCL 5 MG/ML IJ SOLN: 5.0000 mg | Freq: Three times a day (TID) | INTRAMUSCULAR | Status: DC | PRN

## 2020-03-28 MED ORDER — KETOROLAC TROMETHAMINE 30 MG/ML IJ SOLN
INTRAMUSCULAR | Status: AC
  Administered 2020-03-28: 30 mg via INTRAVENOUS
  Filled 2020-03-28: qty 1

## 2020-03-28 MED ORDER — MAGNESIUM SULFATE IN D5W 1-5 GM/100ML-% IV SOLN
1.0000 g | Freq: Once | INTRAVENOUS | Status: AC
  Administered 2020-03-29: 1 g via INTRAVENOUS
  Filled 2020-03-28: qty 100

## 2020-03-28 MED ORDER — ASPIRIN EC 81 MG PO TBEC
81.0000 mg | DELAYED_RELEASE_TABLET | Freq: Every day | ORAL | Status: DC
  Administered 2020-03-28: 81 mg via ORAL
  Filled 2020-03-28: qty 1

## 2020-03-28 MED ORDER — CHLORHEXIDINE GLUCONATE 0.12 % MT SOLN
OROMUCOSAL | Status: AC
  Filled 2020-03-28: qty 15

## 2020-03-28 MED ORDER — SODIUM CHLORIDE 0.9 % IV BOLUS
500.0000 mL | Freq: Once | INTRAVENOUS | Status: AC
  Administered 2020-03-28: 500 mL via INTRAVENOUS

## 2020-03-28 MED ORDER — POTASSIUM CHLORIDE 20 MEQ PO PACK
40.0000 meq | PACK | Freq: Once | ORAL | Status: AC
  Administered 2020-03-29: 40 meq via ORAL
  Filled 2020-03-28: qty 2

## 2020-03-28 MED ORDER — PROPOFOL 10 MG/ML IV BOLUS
INTRAVENOUS | Status: AC
  Filled 2020-03-28: qty 40

## 2020-03-28 MED ORDER — POTASSIUM CHLORIDE CRYS ER 20 MEQ PO TBCR
EXTENDED_RELEASE_TABLET | ORAL | Status: AC
  Filled 2020-03-28: qty 1

## 2020-03-28 MED ORDER — LABETALOL HCL 5 MG/ML IV SOLN
INTRAVENOUS | Status: AC
  Administered 2020-03-28: 20 mg via INTRAVENOUS
  Filled 2020-03-28: qty 4

## 2020-03-28 MED ORDER — MIDAZOLAM HCL 2 MG/2ML IJ SOLN
INTRAMUSCULAR | Status: DC | PRN
  Administered 2020-03-28: 2 mg via INTRAVENOUS

## 2020-03-28 MED ORDER — CARVEDILOL 3.125 MG PO TABS
3.1250 mg | ORAL_TABLET | Freq: Two times a day (BID) | ORAL | Status: DC
Start: 2020-03-29 — End: 2020-03-29
  Filled 2020-03-28 (×2): qty 1

## 2020-03-28 MED ORDER — ACETAMINOPHEN 10 MG/ML IV SOLN: 1000.0000 mg | Freq: Once | INTRAVENOUS | Status: DC | PRN

## 2020-03-28 MED ORDER — FENTANYL CITRATE (PF) 100 MCG/2ML IJ SOLN
50.0000 ug | Freq: Once | INTRAMUSCULAR | Status: AC
  Administered 2020-03-28: 50 ug via INTRAVENOUS

## 2020-03-28 MED ORDER — DEXMEDETOMIDINE HCL IN NACL 200 MCG/50ML IV SOLN
INTRAVENOUS | Status: DC | PRN
  Administered 2020-03-28 (×2): 4 ug via INTRAVENOUS

## 2020-03-28 MED ORDER — ADULT MULTIVITAMIN W/MINERALS CH
1.0000 | ORAL_TABLET | Freq: Every day | ORAL | Status: DC
  Administered 2020-03-28: 1 via ORAL
  Filled 2020-03-28 (×2): qty 1

## 2020-03-28 MED ORDER — PHENYLEPHRINE HCL (PRESSORS) 10 MG/ML IV SOLN
INTRAVENOUS | Status: DC | PRN
  Administered 2020-03-28 (×5): 100 ug via INTRAVENOUS

## 2020-03-28 MED ORDER — FENTANYL CITRATE (PF) 100 MCG/2ML IJ SOLN: 50.0000 ug | INTRAMUSCULAR | Status: DC | PRN

## 2020-03-28 MED ORDER — ALPRAZOLAM 0.5 MG PO TABS
ORAL_TABLET | ORAL | Status: AC
  Filled 2020-03-28: qty 1

## 2020-03-28 MED ORDER — OXYCODONE-ACETAMINOPHEN 5-325 MG PO TABS: 1.0000 | ORAL_TABLET | ORAL | Status: DC | PRN

## 2020-03-28 MED ORDER — LIDOCAINE HCL (PF) 1 % IJ SOLN
INTRAMUSCULAR | Status: AC
  Filled 2020-03-28: qty 5

## 2020-03-28 MED ORDER — TRAZODONE HCL 50 MG PO TABS
25.0000 mg | ORAL_TABLET | Freq: Every evening | ORAL | Status: DC | PRN
Start: 2020-03-28 — End: 2020-03-29
  Filled 2020-03-28: qty 0.5

## 2020-03-28 SURGICAL SUPPLY — 61 items
APL PRP STRL LF DISP 70% ISPRP (MISCELLANEOUS) ×2
BIT DRILL 2.2 SS TIBIAL (BIT) ×1 IMPLANT
BNDG COHESIVE 4X5 TAN STRL (GAUZE/BANDAGES/DRESSINGS) ×2 IMPLANT
BNDG ELASTIC 3X5.8 VLCR STR LF (GAUZE/BANDAGES/DRESSINGS) ×1 IMPLANT
BNDG ELASTIC 4X5.8 VLCR STR LF (GAUZE/BANDAGES/DRESSINGS) ×2 IMPLANT
BNDG ESMARK 4X12 TAN STRL LF (GAUZE/BANDAGES/DRESSINGS) ×2 IMPLANT
CHLORAPREP W/TINT 26 (MISCELLANEOUS) ×4 IMPLANT
CORD BIP STRL DISP 12FT (MISCELLANEOUS) ×2 IMPLANT
COVER WAND RF STERILE (DRAPES) ×2 IMPLANT
CUFF TOURN SGL QUICK 18X4 (TOURNIQUET CUFF) ×1 IMPLANT
DRAPE FLUOR MINI C-ARM 54X84 (DRAPES) ×2 IMPLANT
DRAPE SPLIT 6X30 W/TAPE (DRAPES) ×2 IMPLANT
DRAPE SURG 17X11 SM STRL (DRAPES) ×2 IMPLANT
DRAPE U-SHAPE 47X51 STRL (DRAPES) ×2 IMPLANT
ELECT CAUTERY BLADE 6.4 (BLADE) ×2 IMPLANT
ELECT REM PT RETURN 9FT ADLT (ELECTROSURGICAL) ×2
ELECTRODE REM PT RTRN 9FT ADLT (ELECTROSURGICAL) ×1 IMPLANT
FORCEPS JEWEL BIP 4-3/4 STR (INSTRUMENTS) ×2 IMPLANT
GAUZE SPONGE 4X4 12PLY STRL (GAUZE/BANDAGES/DRESSINGS) ×2 IMPLANT
GAUZE XEROFORM 1X8 LF (GAUZE/BANDAGES/DRESSINGS) ×2 IMPLANT
GLOVE BIO SURGEON STRL SZ8 (GLOVE) ×2 IMPLANT
GLOVE INDICATOR 8.0 STRL GRN (GLOVE) ×2 IMPLANT
GOWN STRL REUS W/ TWL LRG LVL3 (GOWN DISPOSABLE) ×1 IMPLANT
GOWN STRL REUS W/ TWL XL LVL3 (GOWN DISPOSABLE) ×1 IMPLANT
GOWN STRL REUS W/TWL LRG LVL3 (GOWN DISPOSABLE) ×2
GOWN STRL REUS W/TWL XL LVL3 (GOWN DISPOSABLE) ×2
K-WIRE 1.6 (WIRE) ×4
K-WIRE FX5X1.6XNS BN SS (WIRE) ×2
KIT TURNOVER KIT A (KITS) ×2 IMPLANT
KWIRE FX5X1.6XNS BN SS (WIRE) IMPLANT
MANIFOLD NEPTUNE II (INSTRUMENTS) ×2 IMPLANT
NDL FILTER BLUNT 18X1 1/2 (NEEDLE) ×1 IMPLANT
NEEDLE FILTER BLUNT 18X 1/2SAF (NEEDLE) ×1
NEEDLE FILTER BLUNT 18X1 1/2 (NEEDLE) ×1 IMPLANT
NS IRRIG 500ML POUR BTL (IV SOLUTION) ×2 IMPLANT
PACK EXTREMITY ARMC (MISCELLANEOUS) ×2 IMPLANT
PADDING CAST 4IN STRL (MISCELLANEOUS) ×2
PADDING CAST BLEND 4X4 STRL (MISCELLANEOUS) ×2 IMPLANT
PEG LOCKING SMOOTH 2.2X14 (Peg) ×1 IMPLANT
PLATE NARROW DVR RIGHT (Plate) ×1 IMPLANT
SCREW  LP NL 2.7X16MM (Screw) ×6 IMPLANT
SCREW 2.7X14MM (Screw) ×2 IMPLANT
SCREW BN 14X2.7XNONLOCK 3 LD (Screw) IMPLANT
SCREW LP NL 2.7X16MM (Screw) IMPLANT
SCREW LP NL 2.7X22MM (Screw) ×1 IMPLANT
SCREW NLOCK 2.7X14 (Screw) ×2 IMPLANT
SCREW NLOCK 24X2.7 3 LD (Screw) IMPLANT
SCREW NONLOCK 2.7X18MM (Screw) ×3 IMPLANT
SCREW NONLOCK 2.7X24 (Screw) ×2 IMPLANT
SPLINT CAST 1 STEP 3X12 (MISCELLANEOUS) ×1 IMPLANT
SPLINT CAST 1 STEP 4X15 (MISCELLANEOUS) IMPLANT
STAPLER SKIN PROX 35W (STAPLE) ×2 IMPLANT
STOCKINETTE IMPERVIOUS 9X36 MD (GAUZE/BANDAGES/DRESSINGS) ×2 IMPLANT
STRIP CLOSURE SKIN 1/4X4 (GAUZE/BANDAGES/DRESSINGS) IMPLANT
SUT PROLENE 4 0 PS 2 18 (SUTURE) ×2 IMPLANT
SUT VIC AB 2-0 SH 27 (SUTURE) ×2
SUT VIC AB 2-0 SH 27XBRD (SUTURE) ×1 IMPLANT
SUT VIC AB 3-0 SH 27 (SUTURE) ×2
SUT VIC AB 3-0 SH 27X BRD (SUTURE) ×1 IMPLANT
SWABSTK COMLB BENZOIN TINCTURE (MISCELLANEOUS) IMPLANT
SYR 10ML LL (SYRINGE) ×2 IMPLANT
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
## 2020-03-28 NOTE — Anesthesia Procedure Notes (Addendum)
Procedure Name: LMA Insertion Date/Time: 03/28/2020 3:06 PM Performed by: Lanora Manis, CRNA Pre-anesthesia Checklist: Patient identified, Patient being monitored, Timeout performed, Emergency Drugs available and Suction available Patient Re-evaluated:Patient Re-evaluated prior to induction Oxygen Delivery Method: Circle system utilized Preoxygenation: Pre-oxygenation with 100% oxygen Induction Type: IV induction Ventilation: Mask ventilation without difficulty LMA: LMA inserted LMA Size: 4.0 Tube type: Oral Number of attempts: 1 Placement Confirmation: positive ETCO2 and breath sounds checked- equal and bilateral Tube secured with: Tape Dental Injury: Teeth and Oropharynx as per pre-operative assessment

## 2020-03-28 NOTE — Anesthesia Postprocedure Evaluation (Addendum)
Anesthesia Post Note  Patient: MARSHELLA TELLO  Procedure(s) Performed: OPEN REDUCTION INTERNAL FIXATION OF RIGHT DISTAL RADIUS FRACTURE (Right Wrist)  Patient location during evaluation: PACU Anesthesia Type: General Level of consciousness: awake and alert Pain management: pain level controlled Respiratory status: spontaneous breathing and nonlabored ventilation (mild tachypnea) Cardiovascular status: blood pressure returned to baseline and tachycardic Postop Assessment: no apparent nausea or vomiting Anesthetic complications: no    No complications documented.  Pt noted to have progressive tachycardia (HR 130s) as well as borderline hypoxia (SpO2 90-91% RA) after supraclavicular block placement. CXR negative for signs of PTX but did show right hemidiaphragmatic elevation.  SpO2 improved slightly to 92-93% after incentive spirometry.  Pt denies SOB, CP.  Not diaphoretic.  Tachycardia persisting, not particularly responsive to fluid challenge.  Pt denies pain, anxiety.  EKG showed sinus tachycardia with HR 127, cannot rule out prior infarct, otherwise did not appear significantly changed from prior.  She had a NSTEMI in January 2021 s/p stent to LCx.  Will check labs including electrolytes, H/H and troponin and consult hospitalist service. Hospitalist recommends adding D-dimer. Pt will require overnight observation.   KR  Last Vitals:  Vitals:   03/28/20 1815 03/28/20 1824  BP: (!) 165/93 (!) 157/94  Pulse: (!) 118 (!) 123  Resp: 18 15  Temp:    SpO2: 96% 93%    Last Pain:  Vitals:   03/28/20 1824  TempSrc:   PainSc: 1                  Karleen Hampshire

## 2020-03-28 NOTE — Addendum Note (Signed)
Addendum  created 03/28/20 2032 by Karleen Hampshire, MD   Clinical Note Signed, Order list changed, Pharmacy for encounter modified

## 2020-03-28 NOTE — H&P (Signed)
[place]   PATIENT NAME: Whitney Bernard    MR#:  [id number]  DATE OF BIRTH:  10-Nov-1960  DATE OF ADMISSION:  03/28/2020  PRIMARY CARE PHYSICIAN: Evelene Croon, MD   Patient is coming from: Home  REQUESTING/REFERRING PHYSICIAN: Thayer Dallas, MD  CHIEF COMPLAINT:  Persistent tachycardia and tachypnea with brief hypoxia  HISTORY OF PRESENT ILLNESS:  GERIANNE Bernard is a 60 y.o. Caucasian female with medical history significant for coronary artery disease is post PCI and stent, with noncompliance to Plavix and beta-blocker therapy, COPD, type diabetes mellitus, hypertension, hypothyroidism, hypokalemia, migraine and rheumatoid arthritis, who presented to the emergency room with acute onset of accidental mechanical fall tripping over her dog with subsequent right distal radius/wrist fracture for which she just underwent ORIF.  She denies any headache or dizziness or blurred vision, presyncope or syncope, chest pain or palpitations, paresthesias or focal muscle weakness before or after her fall.  The patient had supraclavicular or block for her procedure.  She was then noted to be tachycardic and hypoxic symmetry dropped to 90-91% on room air.  Her tachycardia has persisted in the 130s despite fluid challenge with about 2 L of lactated Ringer as well as 500 mill IV normal saline bolus.  Stat portable chest x-ray revealed no signs of pneumothorax however did show right hemidiaphragmatic elevation.  She received incentive some spirometry with which pulse currently improved to 92-93%.  No chest pain or dyspnea however she is continued to be tachypneic and tachycardic.  She has a history of anxiety but denied any anxiety or pain.  No fever or chills.   ED Course: Earlier in the ER today blood pressure was 162/78 with a heart rate of 109 with otherwise normal vital signs and pulse ox 90 of 97% on room air.  Her blood pressure after her supraclavicular block has been as high as 173/104 with a heart  rate of 130 and respiratory rate of 27.  Stat labs are currently pending.   EKG as reviewed by me : Revealed sinus tachycardia with rate of 129 with slightly poor R wave progression and Q waves inferiorly.  Repeat EKG showed a rate of 127 without significant changes Imaging:  Stat portable chest x-ray revealed no signs of pneumothorax however did show right hemidiaphragmatic elevation.  The patient was given 2 g of IV Ancef earlier, 50 mcg of IV fentanyl, 500 mill IV normal saline bolus and close to 2 L IV lactated Ringer bolus followed by normal saline with 20 mill Cabbell of calcium chloride 100 mill per hour.  She will be admitted to an observation progressive unit bed for further evaluation and management. PAST MEDICAL HISTORY:   Past Medical History:  Diagnosis Date  . Acute pancreatitis   . CAD (coronary artery disease)    a. 08/2017 Neg MV; b. 01/2019 CTA Chest: Mild cor Ca2+; c. 02/2019 NSTEMI/PCI: LCX 95p/m (2.75x15 Resolute Onyx DES); d. 05/2019 Cath: LM nl, LAD nl, LCX patent stent, RCA 30ost/p-->Med rx.  . Chronic pain syndrome   . CKD (chronic kidney disease), stage III (HCC)   . COPD (chronic obstructive pulmonary disease) (HCC)   . Depression   . Diabetes mellitus without complication (HCC)   . Headache(784.0)    migraines  . History of echocardiogram    a. 04/2018 Echo: EF 55-60%. No significant valvular dzs; b. 02/2019 Echo: EF 60-65%. Nl RV fxn.  . Hypercholesteremia   . Hypertension   . Hyperthyroidism   .  Hypokalemia   . Migraine   . Narcotic abuse (HCC)   . RA (rheumatoid arthritis) (HCC)     PAST SURGICAL HISTORY:   Past Surgical History:  Procedure Laterality Date  . ANTERIOR CERVICAL DECOMP/DISCECTOMY FUSION N/A 11/26/2012   Procedure: ANTERIOR CERVICAL DECOMPRESSION/DISCECTOMY FUSION 2 LEVELS;  Surgeon: Reinaldo Meeker, MD;  Location: [facility];  Service: Neurosurgery;  Laterality: N/A;  ANTERIOR CERVICAL DECOMPRESSION/DISCECTOMY FUSION 2 LEVELS  . CERVICAL  POLYPECTOMY    . CHOLECYSTECTOMY    . CORONARY STENT INTERVENTION N/A 02/23/2019   Procedure: CORONARY STENT INTERVENTION;  Surgeon: Yvonne Kendall, MD;  Location: [facility] INVASIVE CV LAB;  Service: Cardiovascular;  Laterality: N/A;  . ESOPHAGOGASTRODUODENOSCOPY N/A 12/26/2014   Procedure: ESOPHAGOGASTRODUODENOSCOPY (EGD);  Surgeon: Midge Minium, MD;  Location: [facility] ENDOSCOPY;  Service: Endoscopy;  Laterality: N/A;  . FRACTURE SURGERY    . LEFT HEART CATH AND CORONARY ANGIOGRAPHY N/A 02/23/2019   Procedure: LEFT HEART CATH AND CORONARY ANGIOGRAPHY;  Surgeon: Antonieta Iba, MD;  Location: [facility] INVASIVE CV LAB;  Service: Cardiovascular;  Laterality: N/A;  . LEFT HEART CATH AND CORONARY ANGIOGRAPHY N/A 06/06/2019   Procedure: LEFT HEART CATH AND CORONARY ANGIOGRAPHY;  Surgeon: Iran Ouch, MD;  Location: [facility] INVASIVE CV LAB;  Service: Cardiovascular;  Laterality: N/A;  . NECK SURGERY    . TUBAL LIGATION    . WISDOM TOOTH EXTRACTION      SOCIAL HISTORY:   Social History   Tobacco Use  . Smoking status: Former Smoker    Packs/day: 0.50    Years: 38.00    Pack years: 19.00    Types: Cigarettes    Quit date: 11/14/2012    Years since quitting: 7.3  . Smokeless tobacco: Never Used  Substance Use Topics  . Alcohol use: Not Currently    Comment: occ    FAMILY HISTORY:   Family History  Problem Relation Age of Onset  . CAD Father   . Alzheimer's disease Other   . Breast cancer Paternal Aunt        another aunt- Polycythemia vera.   . Breast cancer Cousin   . Lung cancer Paternal Uncle     DRUG ALLERGIES:   Allergies  Allergen Reactions  . Almond Oil Hives  . Atorvastatin Other (See Comments)    myalgias  . Rosuvastatin Other (See Comments)    Muscle pain    REVIEW OF SYSTEMS:   ROS As per history of present illness. All pertinent systems were reviewed above. Constitutional, HEENT, cardiovascular, respiratory, GI, GU, musculoskeletal, neuro, psychiatric, endocrine,  integumentary and hematologic systems were reviewed and are otherwise negative/unremarkable except for positive findings mentioned above in the HPI.   MEDICATIONS AT HOME:   Prior to Admission medications   Medication Sig Start Date End Date Taking? Authorizing Provider  ALPRAZolam Prudy Feeler) 0.5 MG tablet Take 0.5 mg by mouth daily. 03/16/20  Yes [provider]  aspirin EC 81 MG EC tablet Take 1 tablet (81 mg total) by mouth daily. 06/07/19  Yes Lurene Shadow, MD  busPIRone (BUSPAR) 10 MG tablet Take 10 mg by mouth 2 (two) times daily.  04/26/18  Yes [provider]  Butalbital-APAP-Caffeine 50-325-40 MG capsule Take 1 tablet by mouth 3 (three) times daily. 04/26/18  Yes [provider]  ezetimibe (ZETIA) 10 MG tablet Take 1 tablet (10 mg total) by mouth daily. 02/26/19  Yes Amin, Loura Halt, MD  furosemide (LASIX) 40 MG tablet Take 40 mg by mouth 2 (two) times  daily.    Yes [provider]  ibuprofen (ADVIL) 200 MG tablet Take 600 mg by mouth every 4 (four) hours as needed for mild pain or moderate pain.   Yes [provider]  mirtazapine (REMERON) 30 MG tablet Take 30 mg by mouth at bedtime. 03/16/20  Yes [provider]  Multiple Vitamins-Minerals (MULTIVITAMIN WITH MINERALS) tablet Take 1 tablet by mouth daily.   Yes [provider]  omeprazole (PRILOSEC) 20 MG capsule Take 20 mg by mouth daily.   Yes [provider]  oxyCODONE-acetaminophen (PERCOCET) 5-325 MG tablet Take 1 tablet by mouth every 4 (four) hours as needed for severe pain. 03/26/20 03/26/21 Yes Willy Eddy, MD  PARoxetine (PAXIL) 40 MG tablet Take 40 mg by mouth daily.   Yes [provider]  potassium chloride SA (KLOR-CON) 20 MEQ tablet Take 1 tablet (20 mEq total) by mouth 2 (two) times daily. 06/07/19  Yes Lurene Shadow, MD  carvedilol (COREG) 3.125 MG tablet Take 1 tablet (3.125 mg total) by mouth 2 (two) times daily. Patient not taking: Reported  on 03/27/2020 10/19/19   Creig Hines, NP  clopidogrel (PLAVIX) 75 MG tablet Take 1 tablet (75 mg total) by mouth daily. Patient not taking: Reported on 03/27/2020 10/19/19   Creig Hines, NP      VITAL SIGNS:  Blood pressure (!) 162/93, pulse (!) 125, temperature 98.6 F (37 C), resp. rate (!) 27, height 5\' 4"  (1.626 m), weight 68 kg, SpO2 95 %.  PHYSICAL EXAMINATION:  Physical Exam  GENERAL:  59 y.o.-year-old Caucasian female patient lying in the bed with no acute distress.  EYES: Pupils equal, round, reactive to light and accommodation. No scleral icterus. Extraocular muscles intact.  HEENT: Head atraumatic, normocephalic. Oropharynx and nasopharynx clear.  NECK:  Supple, no jugular venous distention. No thyroid enlargement, no tenderness.  LUNGS: Normal breath sounds bilaterally, no wheezing, rales,rhonchi or crepitation.  Tachypneic with no use of accessory muscles of respiration.  CARDIOVASCULAR: Tachycardic with regular rate and rhythm, S1, S2 normal. No murmurs, rubs, or gallops.  ABDOMEN: Soft, nondistended, nontender. Bowel sounds present. No organomegaly or mass.  EXTREMITIES: No pedal edema, cyanosis, or clubbing.  NEUROLOGIC: Cranial nerves II through XII are intact. Muscle strength 5/5 in all extremities. Sensation intact. Gait not checked.  PSYCHIATRIC: The patient is alert and oriented x 3.  Normal affect and good eye contact. SKIN: No obvious rash, lesion, or ulcer.   LABORATORY PANEL:   CBC No results for input(s): WBC, HGB, HCT, PLT in the last 168 hours. ------------------------------------------------------------------------------------------------------------------  Chemistries  No results for input(s): NA, K, CL, CO2, GLUCOSE, BUN, CREATININE, CALCIUM, MG, AST, ALT, ALKPHOS, BILITOT in the last 168 hours.  Invalid input(s):  GFRCGP ------------------------------------------------------------------------------------------------------------------  Cardiac Enzymes No results for input(s): TROPONINI in the last 168 hours. ------------------------------------------------------------------------------------------------------------------  RADIOLOGY:  DG Chest Port 1 View  Result Date: 03/28/2020 CLINICAL DATA:  Oxygen desaturation EXAM: PORTABLE CHEST 1 VIEW COMPARISON:  08/31/2019 FINDINGS: Single frontal view of the chest demonstrates an unremarkable cardiac silhouette. There is marked elevation of the right hemidiaphragm. No acute airspace disease, effusion, or pneumothorax. No acute bony abnormalities. IMPRESSION: 1. Increased elevation of the right hemidiaphragm. 2. No acute airspace disease. Electronically Signed   By: 09/02/2019 M.D.   On: 03/28/2020 19:34   05/26/2020 OR NERVE BLOCK-IMAGE ONLY [facility])  Result Date: 03/28/2020 There is no interpretation for this exam.  This order is for images obtained during a surgical procedure.  Please  See "Surgeries" Tab for more information regarding the procedure.   DG MINI C-ARM IMAGE ONLY  Result Date: 03/28/2020 There is no interpretation for this exam.  This order is for images obtained during a surgical procedure.  Please See "Surgeries" Tab for more information regarding the procedure.      IMPRESSION AND PLAN:  Active Problems:   Hypertensive urgency Accidental mechanical fall with subsequent right wrist fracture status post ORIF with: 1.  Hypertensive urgency. -The patient will be admitted on observation progressive unit bed. -We will resume her p.o. Coreg and place her on as needed IV labetalol for optimal BP control. -Pain management will be provided.  2.  Persistent sinus tachycardia with brief hypoxia. -We will this could be related to volume depletion as well as beta-blocker withdrawal we will obtain a D-dimer test being low probability for PE. -Her Coreg  will be resumed as mentioned above. -Her hypoxia could be related to hemidiaphragmatic paralysis, associated with supraclavicular block, currently improving with incentive spirometry  3.  Anxiety and depression. -We will continue her Paxil, BuSpar and Xanax.  4.  Coronary artery disease status post PCI and stent. -We will restart her Plavix and continue aspirin.  5.  Dyslipidemia. -We will continue her Zetia and add statin therapy.  6.  GERD. -PPI therapy will be resumed.  DVT prophylaxis: Lovenox.  Code Status: full code. Family Communication:  The plan of care was discussed in details with the patient (and family). I answered all questions. The patient agreed to proceed with the above mentioned plan. Further management will depend upon hospital course. Disposition Plan: Back to previous home environment Consults called: Orthopedic consultation will be obtained for follow-up on her ORIF. All the records are reviewed and case discussed with [place].  Status is: Observation  The patient remains OBS appropriate and will d/c before 2 midnights.  Dispo: The patient is from: Home              Anticipated d/c is to: Home              Anticipated d/c date is: 1 day              Patient currently is not medically stable to d/c.   Difficult to place patient No   TOTAL TIME TAKING CARE OF THIS PATIENT: 55 minutes.    Hannah Beat M.D on 03/28/2020 at 8:50 PM  [facility]   From 7 PM-7 AM, contact night-coverage [email or web address]  CC: Primary care physician; Evelene Croon, MD

## 2020-03-28 NOTE — Progress Notes (Signed)
Patient with HR 120-130s, Dr Orland Penman made aware and fluid bolus ordered

## 2020-03-28 NOTE — Addendum Note (Signed)
Addendum  created 03/28/20 2052 by Karleen Hampshire, MD   Clinical Note Signed, Order Reconciliation Section accessed, Order list changed, Pharmacy for encounter modified

## 2020-03-28 NOTE — Progress Notes (Signed)
Patient transferred to room 19, report given to Einar Gip RN

## 2020-03-28 NOTE — Progress Notes (Signed)
Dr Orland Penman aware of CXR results , patient using IS reaching around 1000

## 2020-03-28 NOTE — Anesthesia Procedure Notes (Signed)
Anesthesia Regional Block: Supraclavicular block   Pre-Anesthetic Checklist: ,, timeout performed, Correct Patient, Correct Site, Correct Laterality, Correct Procedure, Correct Position, risks and benefits discussed,, pre-op evaluation, at surgeon's request and post-op pain management  Laterality: Right  Prep: chloraprep       Needles:  Injection technique: Single-shot  Needle Type: Stimiplex     Needle Length: 9cm  Needle Gauge: 21     Additional Needles:   Procedures:,,,, ultrasound used (permanent image in chart),,,,  Narrative:  Start time: 03/28/2020 6:00 PM End time: 03/28/2020 6:10 PM  Performed by: Personally  Anesthesiologist: Karleen Hampshire, MD  Additional Notes: Risks and benefits of nerve block discussed with patient, including but not limited to risk of nerve injury, bleeding, infection, and failed block.  Patient expressed understanding and consented to block placement.   Functioning IV was confirmed and monitors were applied.  Sterile prep,hand hygiene and sterile gloves were used.  Minimal sedation used for procedure.  During the procedure, there was negative aspiration, negative paresthesia on injection, and dose was given in divided aliquots under ultrasound guidance.  Patient tolerated the procedure well with no immediate complications.

## 2020-03-28 NOTE — Discharge Instructions (Addendum)
Wrist Fracture Treated With ORIF, Care After This sheet gives you information about how to care for yourself after your procedure. Your health care provider may also give you more specific instructions. If you have problems or questions, contact your health care provider. What can I expect after the procedure? After the procedure, it is common to have:  Pain.  Swelling.  Stiffness.  A small amount of drainage from the incision. Follow these instructions at home: If you have a cast:  Do not stick anything inside the cast to scratch your skin. Doing that increases your risk of infection.  Check the skin around the cast every day. Tell your health care provider about any concerns.  You may put lotion on dry skin around the edges of the cast. Do not put lotion on the skin underneath the cast.  Keep the cast clean and dry. If you have a splint or sling:  Wear the splint or sling as told by your health care provider. Remove it only as told by your health care provider.  Loosen the splint or sling if your fingers tingle, become numb, or turn cold and blue.  Keep the splint or sling clean and dry. Bathing  Do not take baths, swim, or use a hot tub until your health care provider approves. Ask your health care provider if you may take showers. You may only be allowed to take sponge baths.  If your cast, splint, or sling is not waterproof: ? Do not let it get wet. ? Cover it with a watertight covering when you take a bath or shower.  If you have a sling, remove it for bathing only if your health care provider tells you it is safe to do that.  Keep the bandage (dressing) dry until your health care provider says it can be removed. Incision care  Follow instructions from your health care provider about how to take care of your incision. Make sure you: ? Wash your hands with soap and water for at least 20 seconds before and after you change your dressing. If soap and water are not  available, use hand sanitizer. ? Change your dressing as told by your health care provider. ? Leave stitches (sutures), skin glue, or adhesive strips in place. These skin closures may need to stay in place for 2 weeks or longer. If adhesive strip edges start to loosen and curl up, you may trim the loose edges. Do not remove adhesive strips completely unless your health care provider tells you to do that.  Check your incision area every day for signs of infection. Check for: ? Redness. ? More swelling or pain. ? Blood or more fluid. ? Warmth. ? Pus or a bad smell.   Managing pain, stiffness, and swelling  If directed, put ice on the injured area. To do this: ? If you have a removable splint or sling, remove it as told by your health care provider. ? Put ice in a plastic bag. ? Place a towel between your skin and the bag or between your cast and the bag. ? Leave the ice on for 20 minutes, 2-3 times a day. ? Remove the ice if your skin turns bright red. This is very important. If you cannot feel pain, heat, or cold, you have a greater risk of damage to the area.  Move your fingers often to reduce stiffness and swelling.  Raise (elevate) the injured area above the level of your heart while you are sitting or lying  down.   Driving  If you were given a sedative during the procedure, it can affect you for several hours. Do not drive or operate machinery until your health care provider says that it is safe.  Ask your health care provider when it is safe to drive if you have a cast, splint, or sling on your wrist. Activity  Return to your normal activities as told by your health care provider. Ask your health care provider what activities are safe for you.  Do exercises as told by your health care provider.  Do not lift with or put weight on your injured wrist until your health care provider approves.  Avoid pulling and pushing. Medicines  Take over-the-counter and prescription medicines  only as told by your health care provider.  Ask your health care provider if the medicine prescribed to you: ? Requires you to avoid driving or using machinery. ? Can cause constipation. You may need to take these actions to prevent or treat constipation:  Drink enough fluid to keep your urine pale yellow.  Take over-the-counter or prescription medicines.  Eat foods that are high in fiber, such as beans, whole grains, and fresh fruits and vegetables.  Limit foods that are high in fat and processed sugars, such as fried or sweet foods. General instructions  Do not put pressure on any part of the cast or splint until it is fully hardened. This may take several hours.  Do not use any products that contain nicotine or tobacco, such as cigarettes, e-cigarettes, and chewing tobacco. These can delay bone healing after surgery. If you need help quitting, ask your health care provider.  Keep all follow-up visits. This is important. Contact a health care provider if:  Your cast, splint, or sling is damaged or loose.  Your pain is not controlled with medicine.  You have any of these signs of infection: ? A fever. ? Redness around your incision. ? More swelling or pain around your incision. ? Blood or more fluid coming from your incision. ? Warmth coming from your incision. ? Pus or a bad smell coming from your incision or your dressing.  You develop a rash. Get help right away if:  Your skin or fingers on your injured arm turn blue or gray.  Your arm feels cold or numb.  You have severe pain in your injured wrist.  You have trouble breathing.  You feel faint or light-headed. Summary  After the procedure, it is common to have pain, swelling, stiffness, and a small amount of drainage from the incision.  You may use ice, elevation, and pain medicine as told by your health care provider to reduce pain and swelling.  Wear your splint or sling as told by your health care  provider.  Do not lift with or put weight on your injured wrist until your health care provider approves. This information is not intended to replace advice given to you by your health care provider. Make sure you discuss any questions you have with your health care provider. Document Revised: 05/17/2019 Document Reviewed: 05/17/2019 Elsevier Patient Education  2021 [employer] AMBULATORY SURGERY  DISCHARGE INSTRUCTIONS   1) The drugs that you were given will stay in your system until tomorrow so for the next 24 hours you should not:  A) Drive an automobile B) Make any legal decisions C) Drink any alcoholic beverage   2) You may resume regular meals tomorrow.  Today it is better to start with liquids and gradually work  up to solid foods.  You may eat anything you prefer, but it is better to start with liquids, then soup and crackers, and gradually work up to solid foods.   3) Please notify your doctor immediately if you have any unusual bleeding, trouble breathing, redness and pain at the surgery site, drainage, fever, or pain not relieved by medication.    4) Additional Instructions:        Please contact your physician with any problems or Same Day Surgery at [phone], Monday through Friday 6 am to 4 pm, or [place] at [facility] number at [phone].Orthopedic discharge instructions: Keep splint dry and intact. Keep hand elevated above heart level. Apply ice to affected area frequently. Take ibuprofen 600-800 mg TID with meals for 7-10 days, then as necessary. Take pain medication as prescribed or ES Tylenol when needed.  Return for follow-up in 10-14 days or as scheduled.

## 2020-03-28 NOTE — H&P (Signed)
History of Present Illness:  Whitney Bernard is a 60 y.o. female who presents for evaluation and treatment of her right wrist injury. The patient normally lives independently with her husband. She was in her house last evening when she apparently nearly tripped over her dog. While trying to avoid her dog, she fell backwards onto her outstretched right hand and injured her wrist. She was brought to the emergency room where x-rays demonstrated an impacted right distal radius fracture with an associated ulnar styloid fracture. The patient was splinted and referred to orthopedics for further evaluation and treatment. The patient notes moderate pain in her wrist which she rates at 8/10 and for which she has been taking Percocet with partial relief of her symptoms. She denies any prior injury to her wrist and denies any numbness or paresthesias to her fingers. She is right-handed.  Current Outpatient Medications: . ALPRAZolam (XANAX) 0.5 MG tablet Take 0.5 mg by mouth as needed  . aspirin 81 MG EC tablet Take 1 tablet by mouth once daily  . busPIRone (BUSPAR) 10 MG tablet Take 10 mg by mouth 2 (two) times daily  . ezetimibe (ZETIA) 10 mg tablet Take 1 tablet by mouth once daily  . FUROsemide (LASIX) 40 MG tablet TAKE 1 TABLET BY MOUTH TWICE A DAY--PER DR CROWDER DO NOT REFILL EARLY 60 tablet 2  . mirtazapine (REMERON) 30 MG tablet Take 30 mg by mouth nightly  . omeprazole (PRILOSEC) 20 MG DR capsule Take 20 mg by mouth once daily  . oxyCODONE-acetaminophen (PERCOCET) 5-325 mg tablet Take 1 tablet by mouth every 4 (four) hours as needed  . PARoxetine (PAXIL) 40 MG tablet Take 1 tablet (40 mg total) by mouth once daily 30 tablet 11  . potassium chloride (KLOR-CON M20) 20 MEQ ER tablet Take 1 tablet (20 mEq total) by mouth 2 (two) times daily 60 tablet 1  . buprenorphine-naloxone (SUBOXONE) 8-2 mg SL film Place 1 Film (8 mg total) under the tongue 2 (two) times daily 60 each 0  . butalbital-acetaminophen-caffeine  (FIORICET) 50-325-40 mg tablet  . carisoprodol (SOMA) 350 MG tablet Take 1 tablet (350 mg total) by mouth 2 (two) times daily 60 tablet 2  . QUEtiapine (SEROQUEL) 50 MG tablet Take 1 tablet (50 mg total) by mouth nightly 30 tablet 1   Allergies:  . Almond Shortness Of Breath  . Statins-Hmg-Coa Reductase Inhibitors Muscle Pain  Multiple statins   Past Medical History:  . Colitis, unspecified  . Depression/Anxiety  . Edema - possible congestive heart failure  . H/O acute pancreatitis  . History of hyperthyroidism, possibly early  . Hyperlipidemia   Past Surgical History:  . CHOLECYSTECTOMY  . Facial repair secondary to trauma  . History of cervical spine fusion  . TUBAL LIGATION Bilateral   Family History:  . Alzheimer's disease Mother  . High blood pressure (Hypertension) Mother  . Hyperlipidemia (Elevated cholesterol) Mother  . Anxiety Mother  . Alcohol abuse Mother  . Coronary Artery Disease (Blocked arteries around heart) Father  . Prostate cancer Father  . Alcohol abuse Father  . Alcohol abuse Son   Social History:   Socioeconomic History:  Marland Kitchen Marital status: Married  Spouse name: Not on file  . Number of children: Not on file  . Years of education: Not on file  . Highest education level: Not on file  Occupational History  . Not on file  Tobacco Use  . Smoking status: Former Smoker  Types: Cigarettes  Start date: 02/18/1972  Quit date: 04/04/2016  Years since quitting: 3.9  . Smokeless tobacco: Never Used  Substance and Sexual Activity  . Alcohol use: No  Alcohol/week: 0.0 standard drinks  . Drug use: No  . Sexual activity: Yes  Partners: Male  Birth control/protection: None, Surgical  Other Topics Concern  . Not on file  Social History Narrative  Married with two sons. Long term legal conflict with only sib, brother. On disability for Rheumatoid and psych. Hobbies: helps elderly.   Social Determinants of Health:   Physicist, medical Strain: Not on file   Food Insecurity: Not on file  Transportation Needs: Not on file   Review of Systems:  A comprehensive 14 point ROS was performed, reviewed, and the pertinent orthopaedic findings are documented in the HPI.  Physical Exam: There were no vitals filed for this visit. General/Constitutional: The patient appears to be well-nourished, well-developed, and in no acute distress. Neuro/Psych: Normal mood and affect, oriented to person, place and time. Eyes: Non-icteric. Pupils are equal, round, and reactive to light, and exhibit synchronous movement. ENT: Unremarkable. Lymphatic: No palpable adenopathy. Respiratory: Lungs clear to auscultation, Normal chest excursion, No wheezes and Non-labored breathing Cardiovascular: Regular rate and rhythm. No murmurs. and No edema, swelling or tenderness, except as noted in detailed exam. Integumentary: No impressive skin lesions present, except as noted in detailed exam. Musculoskeletal: Unremarkable, except as noted in detailed exam.  Right hand exam: Skin inspection of the right forearm and hand is notable for a well-positioned sugar tong splint which appears to be in good condition. The skin is intact at the proximal and distal ends of the splint. She is able to flex and extend all digits without any pain or triggering. She is neurovascularly intact to all digits.  X-rays/MRI/Lab data:  Recent x-rays of the right wrist are available for review and have been reviewed by myself. These films demonstrate a dorsally displaced/impacted extra-articular fracture of the right distal radius with loss of radial inclination and dorsal tilt. In addition, there is an ulnar styloid fracture. No lytic lesions or significant degenerative changes are identified.  Assessment: Closed Colles' fracture of right distal radius.   Plan: The treatment options were discussed with the patient. In addition, patient educational materials were provided regarding the diagnosis and  treatment options. The patient is concerned by potential functional limitations resulting from this injury as she is right-handed. Therefore, I have recommended a surgical procedure, specifically an open reduction and internal fixation of the right distal radius fracture. The procedure was discussed with the patient, as were the potential risks (including bleeding, infection, nerve and/or blood vessel injury, persistent or recurrent pain, loosening and/or failure of the hardware, malunion, non-union, stiffness of the wrist, development of arthritis in the wrist, need for further surgery, blood clots, strokes, heart attacks and/or arhythmias, pneumonia, etc.) and benefits. The patient states her understanding and wishes to proceed. All of the patient's questions and concerns were answered. She can call any time with further concerns. She will follow up post-surgery, routine.   H&P reviewed and patient re-examined. No changes.

## 2020-03-28 NOTE — OR Nursing (Signed)
Called Dr. Orland Penman, asked if could give the other . of Fentanyl.  May give it, but will come to talk to patient about doing a block.

## 2020-03-28 NOTE — Progress Notes (Signed)
Bolus infused, patients HR still up at 131, sats 91 percent , Dr Orland Penman aware and CXR ordered

## 2020-03-28 NOTE — Transfer of Care (Signed)
Immediate Anesthesia Transfer of Care Note  Patient: Whitney Bernard  Procedure(s) Performed: OPEN REDUCTION INTERNAL FIXATION OF RIGHT DISTAL RADIUS FRACTURE (Right Wrist)  Patient Location: PACU  Anesthesia Type:General  Level of Consciousness: awake, alert  and oriented  Airway & Oxygen Therapy: Patient Spontanous Breathing  Post-op Assessment: Report given to RN  Post vital signs: Reviewed and stable  Last Vitals:  Vitals Value Taken Time  BP 148/90 03/28/20 1642  Temp 36.8 C 03/28/20 1638  Pulse 103 03/28/20 1642  Resp 22 03/28/20 1642  SpO2 96 % 03/28/20 1642  Vitals shown include unvalidated device data.  Last Pain:  Vitals:   03/28/20 1300  TempSrc: Tympanic  PainSc: 4          Complications: No complications documented.

## 2020-03-28 NOTE — Op Note (Signed)
03/28/2020  4:37 PM  Patient:   Whitney Bernard  Pre-Op Diagnosis:   Closed acute extra-articular distal radius fracture, right wrist.  Post-Op Diagnosis:   Same.  Procedure:   Open reduction and internal fixation of right distal radius fracture.  Surgeon:   Maryagnes Amos, MD  Assistant:   None  Anesthesia:   General LMA  Findings:   As above.  Complications:   None  EBL:   10 cc  Fluids:   1000 cc crystalloid  TT:   64 minutes at 250 mmHg  Drains:   None  Closure:   2-0 Vicryl subcuticular sutures  Implants:   Biomet DVR Cross-lock narrow precontoured distal radius mini-locking plate.  Brief Clinical Note:   The patient is a 60 year old female who sustained the above-noted injury 2 days ago when she tripped over her dog while in her home. X-rays in the emergency room demonstrated the above noted injury. She presents at this time for definitive management of her injury.  Procedure:   The patient was brought into the operating room and lain in the supine position. After adequate general laryngal mask anesthesia was obtained, the patient's right hand and upper extremity were prepped with ChloraPrep solution before being draped sterilely. Preoperative antibiotics were administered. A timeout was performed to verify the appropriate surgical site before the limb was exsanguinated with an Esmarch and the tourniquet inflated to 250 mmHg.   An approximately 7-8 cm incision was made over the volar aspect of the distal radius beginning at the volar flexion crease and extending proximally along the flexor carpi radialis tendon. The incision was carried down through the subcutaneous tissues to expose the superficial retinaculum. This was split the length of the incision directly over the flexor carpi radialis tendon. The FCR sheath was opened and the tendon retracted ulnarly to protect the median nerve. The floor of the FCR sheath was opened to expose the pronator quadratus muscle. This was  released along the radial insertion and the muscle was retracted ulnarly to expose the distal radius. The fracture was identified and soft tissues elevated off the distal metaphyseal region for several centimeters. The appropriate sized plate was selected and positioned on the distal radius. A guidewire was placed through the distal hole and its position verified using FluoroScan imaging in AP and lateral projections. After several attempts, the pin was positioned parallel to the distal articular surface and approximately 3-4 mm proximal to the articular surface. The plate was carefully lowered onto the volar metaphyseal surface, reducing the fracture in the process. Again the position of the plate was verified using FluoroScan imaging in AP and lateral projections and found to be excellent.   The plate was secured using a nonlocking bicortical screw proximally. Distally, a nonlocking cortical screw was placed in the ulnar most hole of the more proximal row. The other holes were filled with locking pegs of the appropriate lengths. Once the other pegs were placed, the nonlocking cortical screw was replaced with another locking peg. The adequacy of hardware position and fracture reduction was verified using FluoroScan imaging in AP, lateral, and several additional oblique projections to be sure that the hardware did not enter the joint nor did it penetrate dorsally. Three additional bicortical nonlocking screw was placed proximally to secure the plate to the metaphyseal region proximally. Again the construct was assessed using FluoroScan imaging in AP, lateral, and oblique projections and found to be excellent.  The wound was copiously irrigated with sterile saline solution  before the pronator quadratus was reapproximated using 2-0 Vicryl interrupted sutures. The subcutaneous tissues were closed using 2-0 Vicryl interrupted sutures before the subcuticular layer was closed using 2-0 Vicryl inverted interrupted  sutures. Benzoin and Steri-Strips are applied to the skin. A total of 15 cc of 0.5% plain Sensorcaine was injected in and around the incision site to help with postoperative analgesia before a sterile bulky dressing was applied to the wound. The patient was placed into a volar splint maintaining the wrist in neutral position before the patient was awakened, extubated, and returned to the recovery room in satisfactory condition after tolerating the procedure well.

## 2020-03-29 ENCOUNTER — Encounter: Payer: Self-pay | Admitting: Surgery

## 2020-03-29 ENCOUNTER — Other Ambulatory Visit: Payer: Self-pay | Admitting: Internal Medicine

## 2020-03-29 DIAGNOSIS — I129 Hypertensive chronic kidney disease with stage 1 through stage 4 chronic kidney disease, or unspecified chronic kidney disease: Secondary | ICD-10-CM | POA: Diagnosis not present

## 2020-03-29 DIAGNOSIS — I160 Hypertensive urgency: Secondary | ICD-10-CM | POA: Diagnosis not present

## 2020-03-29 DIAGNOSIS — E872 Acidosis: Secondary | ICD-10-CM

## 2020-03-29 DIAGNOSIS — Z9114 Patient's other noncompliance with medication regimen: Secondary | ICD-10-CM | POA: Diagnosis not present

## 2020-03-29 DIAGNOSIS — N1832 Chronic kidney disease, stage 3b: Secondary | ICD-10-CM | POA: Diagnosis not present

## 2020-03-29 DIAGNOSIS — E1122 Type 2 diabetes mellitus with diabetic chronic kidney disease: Secondary | ICD-10-CM | POA: Diagnosis not present

## 2020-03-29 DIAGNOSIS — R000 Tachycardia, unspecified: Secondary | ICD-10-CM | POA: Diagnosis not present

## 2020-03-29 DIAGNOSIS — Z23 Encounter for immunization: Secondary | ICD-10-CM | POA: Diagnosis not present

## 2020-03-29 DIAGNOSIS — S52551A Other extraarticular fracture of lower end of right radius, initial encounter for closed fracture: Secondary | ICD-10-CM | POA: Diagnosis not present

## 2020-03-29 DIAGNOSIS — I2510 Atherosclerotic heart disease of native coronary artery without angina pectoris: Secondary | ICD-10-CM | POA: Diagnosis not present

## 2020-03-29 LAB — BASIC METABOLIC PANEL
Anion gap: 6 (ref 5–15)
BUN: 13 mg/dL (ref 8–23)
CO2: 18 mmol/L — ABNORMAL LOW (ref 22–32)
Calcium: 8.2 mg/dL — ABNORMAL LOW (ref 8.9–10.3)
Chloride: 118 mmol/L — ABNORMAL HIGH (ref 98–111)
Creatinine, Ser: 1.2 mg/dL — ABNORMAL HIGH (ref 0.44–1.00)
GFR, Estimated: 52 mL/min — ABNORMAL LOW (ref >60)
Glucose, Bld: 130 mg/dL — ABNORMAL HIGH (ref 70–99)
Potassium: 4.3 mmol/L (ref 3.5–5.1)
Sodium: 142 mmol/L (ref 135–145)

## 2020-03-29 LAB — HIV ANTIBODY (ROUTINE TESTING W REFLEX): HIV Screen 4th Generation wRfx: NONREACTIVE

## 2020-03-29 LAB — CBC
HCT: 32.9 % — ABNORMAL LOW (ref 36.0–46.0)
Hemoglobin: 9.9 g/dL — ABNORMAL LOW (ref 12.0–15.0)
MCH: 26.6 pg (ref 26.0–34.0)
MCHC: 30.1 g/dL (ref 30.0–36.0)
MCV: 88.4 fL (ref 80.0–100.0)
Platelets: 349 10*3/uL (ref 150–400)
RBC: 3.72 MIL/uL — ABNORMAL LOW (ref 3.87–5.11)
RDW: 20.7 % — ABNORMAL HIGH (ref 11.5–15.5)
WBC: 12.7 10*3/uL — ABNORMAL HIGH (ref 4.0–10.5)
nRBC: 0 % (ref 0.0–0.2)

## 2020-03-29 MED ORDER — SODIUM BICARBONATE 650 MG PO TABS
650.0000 mg | ORAL_TABLET | Freq: Three times a day (TID) | ORAL | Status: DC
  Filled 2020-03-29 (×3): qty 1

## 2020-03-29 MED ORDER — MORPHINE SULFATE (PF) 4 MG/ML IV SOLN
INTRAVENOUS | Status: AC
  Administered 2020-03-29: 2 mg via INTRAVENOUS
  Filled 2020-03-29: qty 1

## 2020-03-29 MED ORDER — OXYCODONE-ACETAMINOPHEN 5-325 MG PO TABS
ORAL_TABLET | ORAL | Status: AC
  Administered 2020-03-29: 2 via ORAL
  Filled 2020-03-29: qty 2

## 2020-03-29 MED ORDER — KETOROLAC TROMETHAMINE 15 MG/ML IJ SOLN
INTRAMUSCULAR | Status: AC
  Filled 2020-03-29: qty 1

## 2020-03-29 MED ORDER — SODIUM BICARBONATE 650 MG PO TABS: 650.0000 mg | ORAL_TABLET | Freq: Three times a day (TID) | ORAL | 0 refills | Status: DC

## 2020-03-29 NOTE — Progress Notes (Addendum)
Subjective: 1 Day Post-Op Procedure(s) (LRB): OPEN REDUCTION INTERNAL FIXATION OF RIGHT DISTAL RADIUS FRACTURE (Right) Patient reports pain as moderate.   Patient is well, and has had no acute complaints or problems Plan is to go Home after hospital stay. Patient was admitted overnight due to tachycardia, HR 80 this AM.  Improving. Negative for chest pain and shortness of breath Fever: no Gastrointestinal:Negative for nausea and vomiting  Objective: Vital signs in last 24 hours: Temp:  [97.7 F (36.5 C)-99.3 F (37.4 C)] 97.7 F (36.5 C) (02/10 0400) Pulse Rate:  [74-132] 80 (02/10 0626) Resp:  [13-27] 17 (02/10 0626) BP: (110-176)/(61-104) 126/72 (02/10 0421) SpO2:  [89 %-100 %] 95 % (02/10 0626) Weight:  [68 kg] 68 kg (02/09 1300)  Intake/Output from previous day:  Intake/Output Summary (Last 24 hours) at 03/29/2020 0735 Last data filed at 03/29/2020 0500 Gross per 24 hour  Intake 2320 ml  Output 10 ml  Net 2310 ml    Intake/Output this shift: No intake/output data recorded.  Labs: Recent Labs    03/28/20 2054 03/29/20 0543  HGB 11.7* 9.9*   Recent Labs    03/28/20 2054 03/29/20 0543  WBC 11.7* 12.7*  RBC 4.39 3.72*  HCT 37.8 32.9*  PLT 396 349   Recent Labs    03/28/20 2054 03/29/20 0543  NA 139 142  K 3.6 4.3  CL 114* 118*  CO2 15* 18*  BUN 13 13  CREATININE 1.31* 1.20*  GLUCOSE 265* 130*  CALCIUM 8.5* 8.2*   No results for input(s): LABPT, INR in the last 72 hours.   EXAM General - Patient is Alert, Appropriate and Oriented  Cardiac: Regular rhythm, no murmur Extremity - Splint intact to the right arm. Patient is able to flex and extend fingers with moderate pain this AM. Intact to light touch to the dorsal and volar aspect of the hand this morning. Dressing/Incision - clean, dry, no drainage Motor Function - intact, moving foot and toes well on exam.  Abdomen soft with normal bowel sounds this AM.  Past Medical History:  Diagnosis Date   . Acute pancreatitis   . CAD (coronary artery disease)    a. 08/2017 Neg MV; b. 01/2019 CTA Chest: Mild cor Ca2+; c. 02/2019 NSTEMI/PCI: LCX 95p/m (2.75x15 Resolute Onyx DES); d. 05/2019 Cath: LM nl, LAD nl, LCX patent stent, RCA 30ost/p-->Med rx.  . Chronic pain syndrome   . CKD (chronic kidney disease), stage III (HCC)   . COPD (chronic obstructive pulmonary disease) (HCC)   . Depression   . Diabetes mellitus without complication (HCC)   . Headache(784.0)    migraines  . History of echocardiogram    a. 04/2018 Echo: EF 55-60%. No significant valvular dzs; b. 02/2019 Echo: EF 60-65%. Nl RV fxn.  . Hypercholesteremia   . Hypertension   . Hyperthyroidism   . Hypokalemia   . Migraine   . Narcotic abuse (HCC)   . RA (rheumatoid arthritis) (HCC)     Assessment/Plan: 1 Day Post-Op Procedure(s) (LRB): OPEN REDUCTION INTERNAL FIXATION OF RIGHT DISTAL RADIUS FRACTURE (Right) Active Problems:   Hypertensive urgency  Estimated body mass index is 25.75 kg/m as calculated from the following:   Height as of this encounter: 5\' 4"  (1.626 m).   Weight as of this encounter: 68 kg. Advance diet D/C IV fluids when tolerating po intake.  Labs reviewed this AM. Vitals reviewed, HR 80. Tachycardia improving, continue to monitor. Splint intact to the right hand. Plan for discharge home today  if tachycardia continues to improve. Hopeful for discharge later this AM, patient states she may have difficulty with obtaining a ride if her discharge is late in the afternoon.  DVT Prophylaxis - Lovenox and TED hose Non-weightbearing to the right arm.  Valeria Batman, PA-C [facility] Orthopaedic Surgery 03/29/2020, 7:35 AM

## 2020-03-29 NOTE — Discharge Summary (Addendum)
Physician Discharge Summary  Patient ID: Whitney Bernard MRN: [id number] DOB/AGE: 09-28-1960 60 y.o.  Admit date: 03/28/2020 Discharge date: 03/29/2020  Admission Diagnoses:  Discharge Diagnoses:  Active Problems:   Hypertensive urgency   Discharged Condition: good  Hospital Course:  Whitney Bernard is a 59 y.o. Caucasian female with medical history significant for coronary artery disease is post PCI and stent, with noncompliance to Plavix and beta-blocker therapy, COPD, type diabetes mellitus, hypertension, hypothyroidism, hypokalemia, migraine and rheumatoid arthritis, who presented to the emergency room with acute onset of accidental mechanical fall tripping over her dog with subsequent right distal radius/wrist fracture for which she just underwent ORIF. Postoperatively, patient developed significant hypertension and tachycardia.  He was given 2 L of lactated Ringer and continued IV fluids overnight.  She was monitored overnight by hospitalist service.  She is doing well today, she does not have any nausea vomiting.  She preferred to go home.  Orthopedics has cleared patient for discharge.  Her blood pressure is better.  She is medically stable to be discharged.  #1.  Hypertension urgency. Blood pressure much better this morning.  Resume home medicines.  Patient states that she is not taking Coreg at home, but resumed in the hospital.  I have reordered Coreg to restart.  #2.  Sinus tachycardia Secondary to stress and some dehydration.  Resolved after fluids.  3.  Chronic kidney disease stage IIIa. Metabolic acidosis. Reviewed previous lab, patient has chronic kidney disease a year ago, she also has a mild metabolic acidosis.  She will keep in 5 days of sodium Bicarbonate Orally.  4.  Coronary artery disease status post PCI and stent. Patient reported she was not taking Plavix.  Continue aspirin and statin.  5.  Right wrist fracture status post ORIF.     Consults: orthopedic  surgery  Significant Diagnostic Studies:   Treatments: Post surgery, IVF  Discharge Exam: Blood pressure 131/73, pulse 80, temperature 98.6 F (37 C), temperature source Oral, resp. rate 17, height 5\' 4"  (1.626 m), weight 68 kg, SpO2 95 %. General appearance: alert and cooperative Resp: clear to auscultation bilaterally Cardio: regular rate and rhythm, S1, S2 normal, no murmur, click, rub or gallop GI: soft, non-tender; bowel sounds normal; no masses,  no organomegaly Extremities: extremities normal, atraumatic, no cyanosis or edema  Disposition: Discharge disposition: 01-Home or Self Care       Discharge Instructions    Diet - low sodium heart healthy   Complete by: As directed    Discharge wound care:   Complete by: As directed    Keep clean, follow with PCP and orthopedics   Increase activity slowly   Complete by: As directed      Allergies as of 03/29/2020      Reactions   Almond Oil Hives   Atorvastatin Other (See Comments)   myalgias   Rosuvastatin Other (See Comments)   Muscle pain      Medication List    STOP taking these medications   carvedilol 3.125 MG tablet Commonly known as: COREG   clopidogrel 75 MG tablet Commonly known as: PLAVIX     TAKE these medications   ALPRAZolam 0.5 MG tablet Commonly known as: XANAX Take 0.5 mg by mouth daily.   aspirin 81 MG EC tablet Take 1 tablet (81 mg total) by mouth daily.   busPIRone 10 MG tablet Commonly known as: BUSPAR Take 10 mg by mouth 2 (two) times daily.   Butalbital-APAP-Caffeine 50-325-40 MG capsule Take 1  tablet by mouth 3 (three) times daily.   ezetimibe 10 MG tablet Commonly known as: ZETIA Take 1 tablet (10 mg total) by mouth daily.   furosemide 40 MG tablet Commonly known as: LASIX Take 40 mg by mouth 2 (two) times daily.   ibuprofen 200 MG tablet Commonly known as: ADVIL Take 600 mg by mouth every 4 (four) hours as needed for mild pain or moderate pain.   mirtazapine 30 MG  tablet Commonly known as: REMERON Take 30 mg by mouth at bedtime.   multivitamin with minerals tablet Take 1 tablet by mouth daily.   omeprazole 20 MG capsule Commonly known as: PRILOSEC Take 20 mg by mouth daily.   oxyCODONE-acetaminophen 5-325 MG tablet Commonly known as: Percocet Take 1 tablet by mouth every 4 (four) hours as needed for severe pain.   PARoxetine 40 MG tablet Commonly known as: PAXIL Take 40 mg by mouth daily.   potassium chloride SA 20 MEQ tablet Commonly known as: KLOR-CON Take 1 tablet (20 mEq total) by mouth 2 (two) times daily.   sodium bicarbonate 650 MG tablet Take 1 tablet (650 mg total) by mouth 3 (three) times daily for 5 days.            Discharge Care Instructions  (From admission, onward)         Start     Ordered   03/29/20 0000  Discharge wound care:       Comments: Keep clean, follow with PCP and orthopedics   03/29/20 0814          Follow-up Information    Poggi, Excell Seltzer, MD Follow up.   Specialty: Orthopedic Surgery Why: call in am for follow up in 10 til 14 days Contact information: [address] [facility] [place] [address] [phone]        Evelene Croon, MD Follow up in 1 week(s).   Specialty: Family Medicine Contact information: [facility] [place] [address] [phone]        Yvonne Kendall, MD .   Specialty: Cardiology Contact information: [address] [place] [address] [phone]               Signed: Marrion Coy 03/29/2020, 8:14 AM

## 2020-03-30 ENCOUNTER — Encounter: Payer: Self-pay | Admitting: Surgery

## 2020-04-02 ENCOUNTER — Other Ambulatory Visit: Payer: Self-pay | Admitting: Student

## 2020-06-08 ENCOUNTER — Other Ambulatory Visit: Payer: Self-pay

## 2020-06-11 ENCOUNTER — Other Ambulatory Visit: Payer: Self-pay

## 2020-06-11 MED FILL — Butalbital-Acetaminophen-Caffeine Cap 50-300-40 MG: ORAL | 15 days supply | Qty: 90 | Fill #0 | Status: AC

## 2020-06-16 ENCOUNTER — Emergency Department
Admission: EM | Admit: 2020-06-16 | Discharge: 2020-06-16 | Discharge status: Against Medical Advice | Payer: 59 | Attending: Emergency Medicine | Admitting: Emergency Medicine

## 2020-06-16 ENCOUNTER — Emergency Department: Payer: 59

## 2020-06-16 ENCOUNTER — Other Ambulatory Visit: Payer: Self-pay

## 2020-06-16 DIAGNOSIS — J449 Chronic obstructive pulmonary disease, unspecified: Secondary | ICD-10-CM | POA: Insufficient documentation

## 2020-06-16 DIAGNOSIS — E1129 Type 2 diabetes mellitus with other diabetic kidney complication: Secondary | ICD-10-CM | POA: Insufficient documentation

## 2020-06-16 DIAGNOSIS — I129 Hypertensive chronic kidney disease with stage 1 through stage 4 chronic kidney disease, or unspecified chronic kidney disease: Secondary | ICD-10-CM | POA: Insufficient documentation

## 2020-06-16 DIAGNOSIS — Z79899 Other long term (current) drug therapy: Secondary | ICD-10-CM | POA: Diagnosis not present

## 2020-06-16 DIAGNOSIS — I10 Essential (primary) hypertension: Secondary | ICD-10-CM | POA: Diagnosis not present

## 2020-06-16 DIAGNOSIS — Z7982 Long term (current) use of aspirin: Secondary | ICD-10-CM | POA: Diagnosis not present

## 2020-06-16 DIAGNOSIS — R079 Chest pain, unspecified: Secondary | ICD-10-CM | POA: Diagnosis not present

## 2020-06-16 DIAGNOSIS — I2510 Atherosclerotic heart disease of native coronary artery without angina pectoris: Secondary | ICD-10-CM | POA: Insufficient documentation

## 2020-06-16 DIAGNOSIS — Z955 Presence of coronary angioplasty implant and graft: Secondary | ICD-10-CM | POA: Diagnosis not present

## 2020-06-16 DIAGNOSIS — Z87891 Personal history of nicotine dependence: Secondary | ICD-10-CM | POA: Diagnosis not present

## 2020-06-16 DIAGNOSIS — N1831 Chronic kidney disease, stage 3a: Secondary | ICD-10-CM | POA: Insufficient documentation

## 2020-06-16 DIAGNOSIS — J9811 Atelectasis: Secondary | ICD-10-CM | POA: Diagnosis not present

## 2020-06-16 DIAGNOSIS — R0789 Other chest pain: Secondary | ICD-10-CM | POA: Diagnosis not present

## 2020-06-16 DIAGNOSIS — E1122 Type 2 diabetes mellitus with diabetic chronic kidney disease: Secondary | ICD-10-CM | POA: Diagnosis not present

## 2020-06-16 LAB — BASIC METABOLIC PANEL
Anion gap: 8 (ref 5–15)
BUN: 15 mg/dL (ref 8–23)
CO2: 25 mmol/L (ref 22–32)
Calcium: 8.9 mg/dL (ref 8.9–10.3)
Chloride: 106 mmol/L (ref 98–111)
Creatinine, Ser: 1.25 mg/dL — ABNORMAL HIGH (ref 0.44–1.00)
GFR, Estimated: 49 mL/min — ABNORMAL LOW (ref >60)
Glucose, Bld: 158 mg/dL — ABNORMAL HIGH (ref 70–99)
Potassium: 2.7 mmol/L — CL (ref 3.5–5.1)
Sodium: 139 mmol/L (ref 135–145)

## 2020-06-16 LAB — CBC
HCT: 37.8 % (ref 36.0–46.0)
Hemoglobin: 11.7 g/dL — ABNORMAL LOW (ref 12.0–15.0)
MCH: 27.9 pg (ref 26.0–34.0)
MCHC: 31 g/dL (ref 30.0–36.0)
MCV: 90 fL (ref 80.0–100.0)
Platelets: 380 10*3/uL (ref 150–400)
RBC: 4.2 MIL/uL (ref 3.87–5.11)
RDW: 14.9 % (ref 11.5–15.5)
WBC: 10.7 10*3/uL — ABNORMAL HIGH (ref 4.0–10.5)
nRBC: 0 % (ref 0.0–0.2)

## 2020-06-16 LAB — TROPONIN I (HIGH SENSITIVITY): Troponin I (High Sensitivity): 5 ng/L (ref <18)

## 2020-06-16 MED ORDER — POTASSIUM CHLORIDE CRYS ER 20 MEQ PO TBCR
40.0000 meq | EXTENDED_RELEASE_TABLET | Freq: Once | ORAL | Status: AC
  Administered 2020-06-16: 40 meq via ORAL
  Filled 2020-06-16: qty 2

## 2020-06-16 MED ORDER — MORPHINE SULFATE (PF) 4 MG/ML IV SOLN
4.0000 mg | Freq: Once | INTRAVENOUS | Status: AC
  Administered 2020-06-16: 4 mg via INTRAVENOUS
  Filled 2020-06-16: qty 1

## 2020-06-16 MED ORDER — ONDANSETRON HCL 4 MG/2ML IJ SOLN
4.0000 mg | Freq: Once | INTRAMUSCULAR | Status: AC
  Administered 2020-06-16: 4 mg via INTRAVENOUS
  Filled 2020-06-16: qty 2

## 2020-06-16 NOTE — ED Notes (Signed)
Pt requesting additional pain medication, EDP notified. Per EDP, to reassess pain in an hour.

## 2020-06-16 NOTE — ED Triage Notes (Signed)
Pt states she was watching TV at home and started having C/P 6/10, SHOB, and mild dizziness. Pt denies N/V/D. Pt is AOX4, NAD noted. Skin is warm and pink. Pt has history of MI/stents. EMS gave 324 ASA, no nitro given. Per EMS EKG NSR.

## 2020-06-16 NOTE — ED Notes (Signed)
EDP notified of potassium 2.7

## 2020-06-16 NOTE — ED Notes (Addendum)
Pt requesting to leave and understands post education on AMA and signature required. Pt has spouse picking her up. IV removed at this time.

## 2020-06-16 NOTE — ED Provider Notes (Addendum)
[facility] Emergency Department Provider Note  Time seen: 10:55 PM  I have reviewed the triage vital signs and the nursing notes.   HISTORY  Chief Complaint Chest Pain   HPI Whitney Bernard is a 60 y.o. female with a past medical history of CAD with prior MI and stent, chronic pain, COPD, diabetes, hypertension, hyperlipidemia, migraine, narcotic abuse, presents to the emergency department for chest pain.  According to the patient at approximately 6 PM she was watching TV on the couch when she developed chest pain.  Describes as a central chest pain 10/10 in severity.  Denies any nausea vomiting or shortness of breath.  Patient continues to have chest pain.  Here the patient is asking for pain medication immediately upon my evaluation.  Denies any leg pain or swelling.  No abdominal pain.  Largely negative review of systems.   Past Medical History:  Diagnosis Date  . Acute pancreatitis   . CAD (coronary artery disease)    a. 08/2017 Neg MV; b. 01/2019 CTA Chest: Mild cor Ca2+; c. 02/2019 NSTEMI/PCI: LCX 95p/m (2.75x15 Resolute Onyx DES); d. 05/2019 Cath: LM nl, LAD nl, LCX patent stent, RCA 30ost/p-->Med rx.  . Chronic pain syndrome   . CKD (chronic kidney disease), stage III (HCC)   . COPD (chronic obstructive pulmonary disease) (HCC)   . Depression   . Diabetes mellitus without complication (HCC)   . Headache(784.0)    migraines  . History of echocardiogram    a. 04/2018 Echo: EF 55-60%. No significant valvular dzs; b. 02/2019 Echo: EF 60-65%. Nl RV fxn.  . Hypercholesteremia   . Hypertension   . Hyperthyroidism   . Hypokalemia   . Migraine   . Narcotic abuse (HCC)   . RA (rheumatoid arthritis) [facility])     Patient Active Problem List   Diagnosis Date Noted  . Hypertensive urgency 03/28/2020  . Microcytic anemia 11/02/2019  . CAD (coronary artery disease) 06/06/2019  . Depression 06/06/2019  . Unstable angina (HCC)   . COPD (chronic obstructive pulmonary  disease) (HCC)   . Hypertension   . CKD (chronic kidney disease), stage IIIa   . Chest pain, unspecified   . NSTEMI (non-ST elevated myocardial infarction) (HCC)   . Hyperkalemia   . Elevated troponin   . GERD (gastroesophageal reflux disease)   . Type II diabetes mellitus with renal manifestations (HCC)   . Chest pain, non-cardiac 08/23/2017  . Protein-calorie malnutrition, severe 12/26/2014  . 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 Loose stools   . Diarrhea   . Hypokalemia 12/24/2014  . BP (high blood pressure) 02/23/2013  . Misuse of prescription only drugs 02/23/2013  . Depression with anxiety 06/22/2012  . Colitis 05/11/2012  . Clinical depression 05/11/2012  . Hypercholesterolemia 05/11/2012  . Gastroduodenal ulcer 03/23/2012    Past Surgical History:  Procedure Laterality Date  . ANTERIOR CERVICAL DECOMP/DISCECTOMY FUSION N/A 11/26/2012   Procedure: ANTERIOR CERVICAL DECOMPRESSION/DISCECTOMY FUSION 2 LEVELS;  Surgeon: Reinaldo Meeker, MD;  Location: [facility] NEURO ORS;  Service: Neurosurgery;  Laterality: N/A;  ANTERIOR CERVICAL DECOMPRESSION/DISCECTOMY FUSION 2 LEVELS  . CERVICAL POLYPECTOMY    . CHOLECYSTECTOMY    . CORONARY STENT INTERVENTION N/A 02/23/2019   Procedure: CORONARY STENT INTERVENTION;  Surgeon: Yvonne Kendall, MD;  Location: [facility] INVASIVE CV LAB;  Service: Cardiovascular;  Laterality: N/A;  . ESOPHAGOGASTRODUODENOSCOPY N/A 12/26/2014   Procedure: ESOPHAGOGASTRODUODENOSCOPY (EGD);  Surgeon: Midge Minium, MD;  Location: [facility] ENDOSCOPY;  Service: Endoscopy;  Laterality: N/A;  .  FRACTURE SURGERY    . LEFT HEART CATH AND CORONARY ANGIOGRAPHY N/A 02/23/2019   Procedure: LEFT HEART CATH AND CORONARY ANGIOGRAPHY;  Surgeon: Antonieta Iba, MD;  Location: [facility] INVASIVE CV LAB;  Service: Cardiovascular;  Laterality: N/A;  . LEFT HEART CATH AND CORONARY ANGIOGRAPHY N/A 06/06/2019   Procedure: LEFT HEART CATH AND CORONARY ANGIOGRAPHY;  Surgeon: Iran Ouch, MD;  Location:  [facility] INVASIVE CV LAB;  Service: Cardiovascular;  Laterality: N/A;  . NECK SURGERY    . ORIF WRIST FRACTURE Right 03/28/2020   Procedure: OPEN REDUCTION INTERNAL FIXATION OF RIGHT DISTAL RADIUS FRACTURE;  Surgeon: Christena Flake, MD;  Location: [facility] ORS;  Service: Orthopedics;  Laterality: Right;  . TUBAL LIGATION    . WISDOM TOOTH EXTRACTION      Prior to Admission medications   Medication Sig Start Date End Date Taking? Authorizing Provider  ALPRAZolam Prudy Feeler) 0.5 MG tablet Take 0.5 mg by mouth daily. 03/16/20   [provider]  ALPRAZolam Prudy Feeler) 0.5 MG tablet TAKE 1 TABLET BY MOUTH ONCE DAILY FOR MOOD DISORDER 03/13/20 09/09/20  Grayland Jack, FNP  ALPRAZolam Prudy Feeler) 0.5 MG tablet TAK 1 TABLET BY MOUTH EVERY DAY 12/22/19 06/19/20  Grayland Jack, FNP  aspirin EC 81 MG EC tablet Take 1 tablet (81 mg total) by mouth daily. 06/07/19   Lurene Shadow, MD  busPIRone (BUSPAR) 10 MG tablet Take 10 mg by mouth 2 (two) times daily.  04/26/18   [provider]  busPIRone (BUSPAR) 10 MG tablet TAKE 1 TABLET BY MOUTH 3 TIMES DAILY 11/22/19 11/21/20  Grayland Jack, FNP  busPIRone (BUSPAR) 10 MG tablet TAKE 1 TABLET BY MOUTH TWICE DAILY 11/14/19 11/13/20  Grayland Jack, FNP  Butalbital-APAP-Caffeine 50-300-40 MG CAPS TAKE 1 TO 3 CAPSULES BY MOUTH EVERY 8 HOURS AS NEEDED. DO NOT EXCEED 6 CAPSULES PER 24 HOURS. 12/23/19 12/22/20    Butalbital-APAP-Caffeine 50-300-40 MG CAPS TAKE 1 CAPSULE BY MOUTH 3 TIMES DAILY AS NEEDED 10/11/19 10/10/20  Evelene Croon, MD  Butalbital-APAP-Caffeine [id number] MG capsule Take 1 tablet by mouth 3 (three) times daily. 04/26/18   [provider]  carvedilol (COREG) 3.125 MG tablet Take 1 tablet (3.125 mg total) by mouth 2 (two) times daily. Patient not taking: Reported on 03/27/2020 10/19/19   Creig Hines, NP  ezetimibe (ZETIA) 10 MG tablet Take 1 tablet (10 mg total) by mouth daily. 02/26/19   Amin, Loura Halt, MD  furosemide  (LASIX) 40 MG tablet Take 40 mg by mouth 2 (two) times daily.     [provider]  furosemide (LASIX) 40 MG tablet TAKE 1 TABLET BY MOUTH TWICE DAILY 03/13/20 03/13/21  Evelene Croon, MD  ibuprofen (ADVIL) 200 MG tablet Take 600 mg by mouth every 4 (four) hours as needed for mild pain or moderate pain.    [provider]  mirtazapine (REMERON) 30 MG tablet Take 30 mg by mouth at bedtime. 03/16/20   [provider]  mirtazapine (REMERON) 30 MG tablet TAKE 1 TABLET BY MOUTH EVERY DAY BEFORE BEDTIME AS NEEDED 12/23/19 12/22/20  Grayland Jack, FNP  Multiple Vitamins-Minerals (MULTIVITAMIN WITH MINERALS) tablet Take 1 tablet by mouth daily.    [provider]  omeprazole (PRILOSEC) 20 MG capsule Take 20 mg by mouth daily.    [provider]  oxyCODONE-acetaminophen (PERCOCET) 5-325 MG tablet Take 1 tablet by mouth every 4 (four) hours as needed for severe pain. 03/26/20 03/26/21  Willy Eddy, MD  oxyCODONE-acetaminophen (PERCOCET/ROXICET)  5-325 MG tablet TAKE 1 TABLET BY MOUTH EVERY 4 HOURS AS NEEDED 04/02/20 09/29/20  Anson Oregon, PA-C  oxyCODONE-acetaminophen (PERCOCET/ROXICET) 5-325 MG tablet TAKE 1 TABLET BY MOUTH EVERY 4 (FOUR) HOURS AS NEEDED 03/27/20 09/23/20  Poggi, Excell Seltzer, MD  PARoxetine (PAXIL) 40 MG tablet Take 40 mg by mouth daily.    [provider]  PARoxetine (PAXIL) 40 MG tablet TAKE 1 TABLET BY MOUTH EVERY DAY 11/26/19 11/25/20  Grayland Jack, FNP  potassium chloride SA (KLOR-CON) 20 MEQ tablet Take 1 tablet (20 mEq total) by mouth 2 (two) times daily. 06/07/19   Lurene Shadow, MD  potassium chloride SA (KLOR-CON) 20 MEQ tablet TAKE 1 TABLET BY MOUTH EVERY DAY WITH FOOD 12/23/19 12/22/20  Grayland Jack, FNP  potassium chloride SA (KLOR-CON) 20 MEQ tablet TAKE 1 TABLET BY MOUTH TWICE DAILY 10/11/19 10/10/20  Evelene Croon, MD  sodium bicarbonate 650 MG tablet TAKE 1 TABLET (650 MG TOTAL) BY MOUTH 3 (THREE)  TIMES DAILY FOR 5 DAYS. 03/29/20 03/29/21  Marrion Coy, MD    Allergies  Allergen Reactions  . Almond Oil Hives  . Atorvastatin Other (See Comments)    myalgias  . Rosuvastatin Other (See Comments)    Muscle pain    Family History  Problem Relation Age of Onset  . CAD Father   . Alzheimer's disease Other   . Breast cancer Paternal Aunt        another aunt- Polycythemia vera.   . Breast cancer Cousin   . Lung cancer Paternal Uncle     Social History Social History   Tobacco Use  . Smoking status: Former Smoker    Packs/day: 0.50    Years: 38.00    Pack years: 19.00    Types: Cigarettes    Quit date: 11/14/2012    Years since quitting: 7.5  . Smokeless tobacco: Never Used  Substance Use Topics  . Alcohol use: Not Currently    Comment: occ  . Drug use: No    Review of Systems Constitutional: Negative for fever. Cardiovascular: 10/10 central chest pain. Respiratory: Negative for shortness of breath. Gastrointestinal: Negative for abdominal pain, vomiting  Musculoskeletal: Negative for musculoskeletal complaints Neurological: Negative for headache All other ROS negative  ____________________________________________   PHYSICAL EXAM:  VITAL SIGNS: ED Triage Vitals  Enc Vitals Group     BP 06/16/20 2100 139/85     Pulse Rate 06/16/20 2100 74     Resp 06/16/20 2100 17     Temp 06/16/20 2104 98.1 F (36.7 C)     Temp Source 06/16/20 2104 Oral     SpO2 06/16/20 2100 100 %     Weight 06/16/20 2105 150 lb (68 kg)     Height 06/16/20 2105 5\' 4"  (1.626 m)     Head Circumference --      Peak Flow --      Pain Score 06/16/20 2104 6     Pain Loc --      Pain Edu? --      Excl. in GC? --    Constitutional: Alert and oriented. Well appearing and in no distress. Eyes: Normal exam ENT      Head: Normocephalic and atraumatic.      Mouth/Throat: Mucous membranes are moist. Cardiovascular: Normal rate, regular rhythm. Respiratory: Normal respiratory effort without  tachypnea nor retractions. Breath sounds are clear  Gastrointestinal: Soft and nontender. No distention. Musculoskeletal: Nontender with normal range of motion in all extremities. Neurologic:  Normal  speech and language. No gross focal neurologic deficits  Skin:  Skin is warm, dry and intact.  Psychiatric: Mood and affect are normal.    ____________________________________________   EKG viewed and interpreted by myself shows normal sinus rhythm at 77 bpm with a narrow QRS, normal axis, normal intervals, nonspecific ST changes.  RADIOLOGY  Chest x-ray is negative  ____________________________________________   INITIAL IMPRESSION / ASSESSMENT AND PLAN / ED COURSE  Pertinent labs & imaging results that were available during my care of the patient were reviewed by me and considered in my medical decision making (see chart for details).   Patient presents emergency department for central chest pain 10/10 in severity that occurred while she was at rest watching TV.  Patient does have a history of prior MI and stent placed in 2019.  Follows up with Dr. Kirke Corin.  Patient is asking for pain medication.  We will dose pain medicine for the patient.  We will check labs including cardiac enzyme and continue to closely monitor.  Patient's chest x-ray is reassuring, EKG reassuring.  Patient's lab work is thus far reassuring as well including an initial negative troponin.  Repeat troponin is pending.  Patient is once again asking for pain medication less than an hour after initial administration.  She is now threatening to leave she is not receiving more pain medicine.  Patient has left AMA.  ALEGRIA DOMINIQUE was evaluated in Emergency Department on 06/16/2020 for the symptoms described in the history of present illness. She was evaluated in the context of the global COVID-19 pandemic, which necessitated consideration that the patient might be at risk for infection with the SARS-CoV-2 virus that causes  COVID-19. Institutional protocols and algorithms that pertain to the evaluation of patients at risk for COVID-19 are in a state of rapid change based on information released by regulatory bodies including the CDC and federal and state organizations. These policies and algorithms were followed during the patient's care in the ED.  ____________________________________________   FINAL CLINICAL IMPRESSION(S) / ED DIAGNOSES  Chest pain   Minna Antis, MD 06/16/20 2259    Minna Antis, MD 06/16/20 2259    Minna Antis, MD 06/16/20 2308

## 2020-06-18 ENCOUNTER — Other Ambulatory Visit: Payer: Self-pay

## 2020-06-18 MED FILL — Buspirone HCl Tab 10 MG: ORAL | 30 days supply | Qty: 90 | Fill #0 | Status: AC

## 2020-06-18 MED FILL — Potassium Chloride Microencapsulated Crys ER Tab 20 mEq: ORAL | 90 days supply | Qty: 180 | Fill #0 | Status: AC

## 2020-06-19 ENCOUNTER — Other Ambulatory Visit: Payer: Self-pay

## 2020-06-25 ENCOUNTER — Other Ambulatory Visit: Payer: Self-pay

## 2020-06-25 MED FILL — Butalbital-Acetaminophen-Caffeine Cap 50-300-40 MG: ORAL | 15 days supply | Qty: 90 | Fill #1 | Status: AC

## 2020-06-28 ENCOUNTER — Other Ambulatory Visit: Payer: Self-pay

## 2020-06-28 MED FILL — Furosemide Tab 40 MG: ORAL | 90 days supply | Qty: 180 | Fill #0 | Status: AC

## 2020-07-05 ENCOUNTER — Other Ambulatory Visit: Payer: Self-pay

## 2020-07-05 DIAGNOSIS — E118 Type 2 diabetes mellitus with unspecified complications: Secondary | ICD-10-CM | POA: Diagnosis not present

## 2020-07-05 DIAGNOSIS — E876 Hypokalemia: Secondary | ICD-10-CM | POA: Diagnosis not present

## 2020-07-05 DIAGNOSIS — E782 Mixed hyperlipidemia: Secondary | ICD-10-CM | POA: Diagnosis not present

## 2020-07-05 DIAGNOSIS — F39 Unspecified mood [affective] disorder: Secondary | ICD-10-CM | POA: Diagnosis not present

## 2020-07-05 DIAGNOSIS — I10 Essential (primary) hypertension: Secondary | ICD-10-CM | POA: Diagnosis not present

## 2020-07-05 DIAGNOSIS — I2510 Atherosclerotic heart disease of native coronary artery without angina pectoris: Secondary | ICD-10-CM | POA: Diagnosis not present

## 2020-07-05 MED ORDER — LISINOPRIL 10 MG PO TABS
ORAL_TABLET | ORAL | 0 refills | Status: DC
  Filled 2020-07-05: qty 90, 90d supply, fill #0

## 2020-07-05 MED ORDER — METFORMIN HCL 500 MG PO TABS
ORAL_TABLET | ORAL | 0 refills | Status: DC
  Filled 2020-07-05: qty 90, 90d supply, fill #0

## 2020-07-05 MED ORDER — ALPRAZOLAM 0.5 MG PO TABS
ORAL_TABLET | ORAL | 0 refills | Status: DC
  Filled 2020-07-05: qty 30, 30d supply, fill #0

## 2020-07-05 MED ORDER — ATORVASTATIN CALCIUM 10 MG PO TABS
ORAL_TABLET | ORAL | 0 refills | Status: DC
  Filled 2020-07-05: qty 90, 90d supply, fill #0

## 2020-07-05 MED ORDER — EZETIMIBE 10 MG PO TABS
ORAL_TABLET | ORAL | 3 refills | Status: DC
  Filled 2020-07-05: qty 90, 90d supply, fill #0

## 2020-07-05 MED ORDER — PAROXETINE HCL 40 MG PO TABS
ORAL_TABLET | ORAL | 1 refills | Status: DC
  Filled 2020-07-05: qty 90, 90d supply, fill #0

## 2020-07-08 MED FILL — Butalbital-Acetaminophen-Caffeine Cap 50-300-40 MG: ORAL | 15 days supply | Qty: 90 | Fill #2 | Status: AC

## 2020-07-09 ENCOUNTER — Other Ambulatory Visit: Payer: Self-pay

## 2020-07-09 MED ORDER — LISINOPRIL 20 MG PO TABS
ORAL_TABLET | ORAL | 1 refills | Status: DC
  Filled 2020-07-09 – 2021-05-07 (×2): qty 90, 90d supply, fill #0

## 2020-07-10 ENCOUNTER — Other Ambulatory Visit: Payer: Self-pay

## 2020-07-23 ENCOUNTER — Other Ambulatory Visit: Payer: Self-pay

## 2020-07-23 MED FILL — Butalbital-Acetaminophen-Caffeine Cap 50-300-40 MG: ORAL | 15 days supply | Qty: 90 | Fill #3 | Status: CN

## 2020-07-24 MED FILL — Butalbital-Acetaminophen-Caffeine Cap 50-300-40 MG: ORAL | 15 days supply | Qty: 90 | Fill #3 | Status: CN

## 2020-07-25 ENCOUNTER — Other Ambulatory Visit: Payer: Self-pay

## 2020-07-26 ENCOUNTER — Other Ambulatory Visit: Payer: Self-pay

## 2020-07-26 MED FILL — Butalbital-Acetaminophen-Caffeine Cap 50-300-40 MG: ORAL | 15 days supply | Qty: 90 | Fill #3 | Status: AC

## 2020-08-01 ENCOUNTER — Other Ambulatory Visit: Payer: Self-pay

## 2020-08-07 ENCOUNTER — Other Ambulatory Visit: Payer: Self-pay

## 2020-08-07 DIAGNOSIS — Z6825 Body mass index (BMI) 25.0-25.9, adult: Secondary | ICD-10-CM | POA: Diagnosis not present

## 2020-08-07 DIAGNOSIS — F39 Unspecified mood [affective] disorder: Secondary | ICD-10-CM | POA: Diagnosis not present

## 2020-08-07 DIAGNOSIS — I10 Essential (primary) hypertension: Secondary | ICD-10-CM | POA: Diagnosis not present

## 2020-08-07 DIAGNOSIS — R519 Headache, unspecified: Secondary | ICD-10-CM | POA: Diagnosis not present

## 2020-08-07 MED ORDER — ALPRAZOLAM 0.5 MG PO TABS
ORAL_TABLET | ORAL | 0 refills | Status: DC
  Filled 2020-08-07: qty 30, 30d supply, fill #0

## 2020-08-08 ENCOUNTER — Other Ambulatory Visit: Payer: Self-pay

## 2020-08-08 MED ORDER — BUTALBITAL-APAP-CAFFEINE 50-300-40 MG PO CAPS
ORAL_CAPSULE | ORAL | 5 refills | Status: DC
  Filled 2020-08-08: qty 90, 15d supply, fill #0
  Filled 2020-08-23: qty 90, 15d supply, fill #1
  Filled 2020-09-05: qty 90, 15d supply, fill #2
  Filled 2020-09-20 – 2020-09-25 (×2): qty 90, 15d supply, fill #3
  Filled 2020-10-08: qty 90, 15d supply, fill #4
  Filled 2020-10-25: qty 90, 15d supply, fill #5

## 2020-08-21 ENCOUNTER — Other Ambulatory Visit: Payer: Self-pay

## 2020-08-21 MED FILL — Buspirone HCl Tab 10 MG: ORAL | 30 days supply | Qty: 90 | Fill #1 | Status: AC

## 2020-08-23 ENCOUNTER — Other Ambulatory Visit: Payer: Self-pay

## 2020-08-23 MED ORDER — PAROXETINE HCL 40 MG PO TABS
ORAL_TABLET | ORAL | 1 refills | Status: DC
  Filled 2020-08-23: qty 90, 90d supply, fill #0
  Filled 2020-11-20: qty 90, 90d supply, fill #1

## 2020-08-24 ENCOUNTER — Other Ambulatory Visit: Payer: Self-pay

## 2020-08-27 ENCOUNTER — Other Ambulatory Visit: Payer: Self-pay

## 2020-09-05 ENCOUNTER — Other Ambulatory Visit: Payer: Self-pay

## 2020-09-06 ENCOUNTER — Other Ambulatory Visit: Payer: Self-pay

## 2020-09-06 DIAGNOSIS — N183 Chronic kidney disease, stage 3 unspecified: Secondary | ICD-10-CM | POA: Diagnosis not present

## 2020-09-06 DIAGNOSIS — Z6825 Body mass index (BMI) 25.0-25.9, adult: Secondary | ICD-10-CM | POA: Diagnosis not present

## 2020-09-06 DIAGNOSIS — I10 Essential (primary) hypertension: Secondary | ICD-10-CM | POA: Diagnosis not present

## 2020-09-06 DIAGNOSIS — E049 Nontoxic goiter, unspecified: Secondary | ICD-10-CM | POA: Diagnosis not present

## 2020-09-06 MED ORDER — ALPRAZOLAM 0.5 MG PO TABS
ORAL_TABLET | ORAL | 2 refills | Status: DC
  Filled 2020-09-06: qty 30, 30d supply, fill #0
  Filled 2020-10-05: qty 30, 30d supply, fill #1
  Filled 2020-11-02: qty 30, 30d supply, fill #2

## 2020-09-10 ENCOUNTER — Other Ambulatory Visit: Payer: Self-pay

## 2020-09-10 MED ORDER — TRIAMCINOLONE ACETONIDE 0.1 % EX CREA
TOPICAL_CREAM | CUTANEOUS | 3 refills | Status: DC
  Filled 2020-09-10: qty 30, 15d supply, fill #0
  Filled 2020-09-24: qty 30, 15d supply, fill #1

## 2020-09-11 ENCOUNTER — Other Ambulatory Visit: Payer: Self-pay

## 2020-09-11 MED ORDER — POTASSIUM CHLORIDE CRYS ER 20 MEQ PO TBCR
EXTENDED_RELEASE_TABLET | ORAL | 5 refills | Status: DC
  Filled 2020-09-11: qty 60, 30d supply, fill #0
  Filled 2020-10-08: qty 60, 30d supply, fill #1
  Filled 2020-11-02: qty 60, 30d supply, fill #2
  Filled 2020-12-05: qty 60, 30d supply, fill #3
  Filled 2021-01-04: qty 60, 30d supply, fill #4
  Filled 2021-01-30: qty 60, 30d supply, fill #5

## 2020-09-12 ENCOUNTER — Other Ambulatory Visit: Payer: Self-pay

## 2020-09-17 ENCOUNTER — Other Ambulatory Visit: Payer: Self-pay

## 2020-09-20 ENCOUNTER — Other Ambulatory Visit: Payer: Self-pay

## 2020-09-24 ENCOUNTER — Other Ambulatory Visit: Payer: Self-pay

## 2020-09-25 ENCOUNTER — Other Ambulatory Visit: Payer: Self-pay

## 2020-10-04 ENCOUNTER — Other Ambulatory Visit: Payer: Self-pay

## 2020-10-05 ENCOUNTER — Other Ambulatory Visit: Payer: Self-pay

## 2020-10-08 ENCOUNTER — Other Ambulatory Visit: Payer: Self-pay

## 2020-10-08 MED FILL — Furosemide Tab 40 MG: ORAL | 90 days supply | Qty: 180 | Fill #1 | Status: AC

## 2020-10-08 MED FILL — Buspirone HCl Tab 10 MG: ORAL | 30 days supply | Qty: 90 | Fill #2 | Status: AC

## 2020-10-09 ENCOUNTER — Other Ambulatory Visit: Payer: Self-pay

## 2020-10-10 ENCOUNTER — Other Ambulatory Visit: Payer: Self-pay

## 2020-10-23 ENCOUNTER — Other Ambulatory Visit: Payer: Self-pay

## 2020-10-25 ENCOUNTER — Other Ambulatory Visit: Payer: Self-pay

## 2020-10-29 ENCOUNTER — Other Ambulatory Visit: Payer: Self-pay

## 2020-11-02 ENCOUNTER — Other Ambulatory Visit: Payer: Self-pay

## 2020-11-07 ENCOUNTER — Other Ambulatory Visit: Payer: Self-pay

## 2020-11-08 ENCOUNTER — Other Ambulatory Visit: Payer: Self-pay

## 2020-11-08 DIAGNOSIS — E118 Type 2 diabetes mellitus with unspecified complications: Secondary | ICD-10-CM | POA: Diagnosis not present

## 2020-11-08 DIAGNOSIS — K140 Glossitis: Secondary | ICD-10-CM | POA: Diagnosis not present

## 2020-11-08 DIAGNOSIS — F411 Generalized anxiety disorder: Secondary | ICD-10-CM | POA: Diagnosis not present

## 2020-11-08 DIAGNOSIS — N183 Chronic kidney disease, stage 3 unspecified: Secondary | ICD-10-CM | POA: Diagnosis not present

## 2020-11-08 DIAGNOSIS — D509 Iron deficiency anemia, unspecified: Secondary | ICD-10-CM | POA: Diagnosis not present

## 2020-11-08 MED ORDER — BUSPIRONE HCL 10 MG PO TABS
ORAL_TABLET | ORAL | 5 refills | Status: DC
  Filled 2020-11-08: qty 90, 30d supply, fill #0
  Filled 2020-12-28: qty 90, 30d supply, fill #1

## 2020-11-08 MED ORDER — FLUOXETINE HCL 20 MG PO CAPS
ORAL_CAPSULE | ORAL | 3 refills | Status: DC
  Filled 2020-11-08: qty 30, 30d supply, fill #0
  Filled 2021-03-18: qty 30, 30d supply, fill #1
  Filled 2021-07-01: qty 30, 30d supply, fill #2
  Filled 2021-07-30: qty 30, 30d supply, fill #3

## 2020-11-08 MED ORDER — ALPRAZOLAM 0.5 MG PO TABS
ORAL_TABLET | ORAL | 2 refills | Status: DC
  Filled 2020-11-28 – 2020-11-30 (×2): qty 30, 30d supply, fill #0
  Filled 2020-12-25 – 2020-12-28 (×2): qty 30, 30d supply, fill #1
  Filled 2021-01-25: qty 30, 30d supply, fill #2

## 2020-11-09 ENCOUNTER — Other Ambulatory Visit: Payer: Self-pay

## 2020-11-09 MED ORDER — BUTALBITAL-APAP-CAFFEINE 50-325-40 MG PO TABS
ORAL_TABLET | ORAL | 2 refills | Status: DC
  Filled 2020-11-09 (×2): qty 90, 30d supply, fill #0
  Filled 2020-11-20 – 2020-12-07 (×2): qty 90, 30d supply, fill #1
  Filled 2021-01-04: qty 90, 30d supply, fill #2

## 2020-11-09 MED ORDER — BUTALBITAL-APAP-CAFFEINE 50-300-40 MG PO CAPS
ORAL_CAPSULE | ORAL | 2 refills | Status: DC
  Filled 2020-11-09: qty 90, 30d supply, fill #0

## 2020-11-20 ENCOUNTER — Other Ambulatory Visit: Payer: Self-pay

## 2020-11-21 ENCOUNTER — Other Ambulatory Visit: Payer: Self-pay

## 2020-11-28 ENCOUNTER — Other Ambulatory Visit: Payer: Self-pay

## 2020-11-30 ENCOUNTER — Other Ambulatory Visit: Payer: Self-pay

## 2020-12-05 ENCOUNTER — Other Ambulatory Visit: Payer: Self-pay

## 2020-12-07 ENCOUNTER — Other Ambulatory Visit: Payer: Self-pay

## 2020-12-25 ENCOUNTER — Other Ambulatory Visit: Payer: Self-pay

## 2020-12-25 MED FILL — Furosemide Tab 40 MG: ORAL | 90 days supply | Qty: 180 | Fill #2 | Status: AC

## 2020-12-28 ENCOUNTER — Other Ambulatory Visit: Payer: Self-pay

## 2020-12-31 ENCOUNTER — Other Ambulatory Visit: Payer: Self-pay

## 2021-01-02 ENCOUNTER — Other Ambulatory Visit: Payer: Self-pay

## 2021-01-04 ENCOUNTER — Other Ambulatory Visit: Payer: Self-pay

## 2021-01-22 ENCOUNTER — Other Ambulatory Visit: Payer: Self-pay

## 2021-01-25 ENCOUNTER — Other Ambulatory Visit: Payer: Self-pay

## 2021-01-29 ENCOUNTER — Other Ambulatory Visit: Payer: Self-pay

## 2021-01-29 DIAGNOSIS — Z7185 Encounter for immunization safety counseling: Secondary | ICD-10-CM | POA: Diagnosis not present

## 2021-01-29 DIAGNOSIS — Z23 Encounter for immunization: Secondary | ICD-10-CM | POA: Diagnosis not present

## 2021-01-29 DIAGNOSIS — E049 Nontoxic goiter, unspecified: Secondary | ICD-10-CM | POA: Diagnosis not present

## 2021-01-29 DIAGNOSIS — F332 Major depressive disorder, recurrent severe without psychotic features: Secondary | ICD-10-CM | POA: Diagnosis not present

## 2021-01-29 DIAGNOSIS — E538 Deficiency of other specified B group vitamins: Secondary | ICD-10-CM | POA: Diagnosis not present

## 2021-01-29 MED ORDER — ARIPIPRAZOLE 2 MG PO TABS
ORAL_TABLET | ORAL | 0 refills | Status: DC
  Filled 2021-01-29: qty 30, 30d supply, fill #0

## 2021-01-29 MED ORDER — BUTALBITAL-APAP-CAFFEINE 50-325-40 MG PO TABS
ORAL_TABLET | ORAL | 2 refills | Status: DC
  Filled 2021-01-30 – 2021-02-01 (×2): qty 90, 30d supply, fill #0
  Filled 2021-02-27 – 2021-03-01 (×2): qty 90, 30d supply, fill #1
  Filled 2021-03-28: qty 90, 30d supply, fill #2
  Filled ????-??-??: fill #0

## 2021-01-29 MED ORDER — ATORVASTATIN CALCIUM 40 MG PO TABS
ORAL_TABLET | ORAL | 0 refills | Status: DC
  Filled 2021-01-29: qty 90, 90d supply, fill #0

## 2021-01-29 MED ORDER — FLUOXETINE HCL 20 MG PO CAPS
ORAL_CAPSULE | ORAL | 3 refills | Status: DC
  Filled 2021-01-29 – 2021-04-29 (×2): qty 30, 30d supply, fill #0
  Filled 2021-06-01: qty 30, 30d supply, fill #1

## 2021-01-30 ENCOUNTER — Other Ambulatory Visit: Payer: Self-pay

## 2021-02-01 ENCOUNTER — Other Ambulatory Visit: Payer: Self-pay

## 2021-02-18 ENCOUNTER — Other Ambulatory Visit: Payer: Self-pay

## 2021-02-20 ENCOUNTER — Other Ambulatory Visit: Payer: Self-pay

## 2021-02-21 ENCOUNTER — Other Ambulatory Visit: Payer: Self-pay

## 2021-02-22 ENCOUNTER — Other Ambulatory Visit: Payer: Self-pay

## 2021-02-25 ENCOUNTER — Other Ambulatory Visit: Payer: Self-pay

## 2021-02-25 DIAGNOSIS — E538 Deficiency of other specified B group vitamins: Secondary | ICD-10-CM | POA: Diagnosis not present

## 2021-02-26 ENCOUNTER — Other Ambulatory Visit: Payer: Self-pay

## 2021-02-27 ENCOUNTER — Other Ambulatory Visit: Payer: Self-pay

## 2021-02-27 MED ORDER — ARIPIPRAZOLE 2 MG PO TABS
ORAL_TABLET | ORAL | 2 refills | Status: DC
  Filled 2021-02-27: qty 30, 30d supply, fill #0
  Filled 2021-03-27: qty 30, 30d supply, fill #1
  Filled 2021-04-29: qty 30, 30d supply, fill #2

## 2021-02-27 MED ORDER — POTASSIUM CHLORIDE CRYS ER 20 MEQ PO TBCR
EXTENDED_RELEASE_TABLET | ORAL | 5 refills | Status: DC
  Filled 2021-02-27: qty 60, 30d supply, fill #0
  Filled 2021-04-08: qty 60, 30d supply, fill #1
  Filled 2021-05-01: qty 60, 30d supply, fill #2
  Filled 2021-06-01: qty 60, 30d supply, fill #3

## 2021-03-01 ENCOUNTER — Other Ambulatory Visit: Payer: Self-pay

## 2021-03-11 ENCOUNTER — Other Ambulatory Visit: Payer: Self-pay

## 2021-03-19 ENCOUNTER — Other Ambulatory Visit: Payer: Self-pay

## 2021-03-26 ENCOUNTER — Other Ambulatory Visit: Payer: Self-pay

## 2021-03-27 ENCOUNTER — Other Ambulatory Visit: Payer: Self-pay

## 2021-03-28 ENCOUNTER — Other Ambulatory Visit: Payer: Self-pay

## 2021-03-29 ENCOUNTER — Other Ambulatory Visit: Payer: Self-pay

## 2021-04-02 ENCOUNTER — Other Ambulatory Visit: Payer: Self-pay

## 2021-04-03 ENCOUNTER — Other Ambulatory Visit: Payer: Self-pay

## 2021-04-05 ENCOUNTER — Other Ambulatory Visit: Payer: Self-pay

## 2021-04-05 DIAGNOSIS — E538 Deficiency of other specified B group vitamins: Secondary | ICD-10-CM | POA: Diagnosis not present

## 2021-04-09 ENCOUNTER — Other Ambulatory Visit: Payer: Self-pay

## 2021-04-25 ENCOUNTER — Other Ambulatory Visit: Payer: Self-pay

## 2021-04-25 DIAGNOSIS — E538 Deficiency of other specified B group vitamins: Secondary | ICD-10-CM | POA: Diagnosis not present

## 2021-04-25 MED ORDER — BUTALBITAL-APAP-CAFFEINE 50-325-40 MG PO TABS
ORAL_TABLET | ORAL | 2 refills | Status: DC
  Filled 2021-04-25: qty 90, 30d supply, fill #0
  Filled 2021-05-23: qty 90, 30d supply, fill #1

## 2021-04-26 ENCOUNTER — Other Ambulatory Visit: Payer: Self-pay

## 2021-04-29 ENCOUNTER — Other Ambulatory Visit: Payer: Self-pay

## 2021-05-01 ENCOUNTER — Other Ambulatory Visit: Payer: Self-pay

## 2021-05-02 ENCOUNTER — Other Ambulatory Visit: Payer: Self-pay

## 2021-05-07 ENCOUNTER — Other Ambulatory Visit: Payer: Self-pay

## 2021-05-13 ENCOUNTER — Other Ambulatory Visit: Payer: Self-pay

## 2021-05-13 MED ORDER — FUROSEMIDE 40 MG PO TABS
40.0000 mg | ORAL_TABLET | Freq: Two times a day (BID) | ORAL | 3 refills | Status: DC
  Filled 2021-05-13: qty 180, 90d supply, fill #0
  Filled 2021-08-30: qty 180, 90d supply, fill #1
  Filled 2021-10-15: qty 180, 90d supply, fill #2
  Filled 2021-12-30 – 2022-01-13 (×2): qty 180, 90d supply, fill #3

## 2021-05-21 ENCOUNTER — Other Ambulatory Visit: Payer: Self-pay

## 2021-05-23 ENCOUNTER — Other Ambulatory Visit: Payer: Self-pay

## 2021-05-28 ENCOUNTER — Other Ambulatory Visit: Payer: Self-pay

## 2021-05-30 ENCOUNTER — Other Ambulatory Visit: Payer: Self-pay

## 2021-05-31 ENCOUNTER — Other Ambulatory Visit: Payer: Self-pay

## 2021-06-01 ENCOUNTER — Other Ambulatory Visit: Payer: Self-pay

## 2021-06-03 ENCOUNTER — Other Ambulatory Visit: Payer: Self-pay

## 2021-06-03 MED ORDER — ARIPIPRAZOLE 2 MG PO TABS
ORAL_TABLET | ORAL | 2 refills | Status: DC
  Filled 2021-06-03: qty 30, 30d supply, fill #0
  Filled 2021-07-09: qty 30, 30d supply, fill #1
  Filled 2021-08-26: qty 30, 30d supply, fill #2

## 2021-06-04 ENCOUNTER — Other Ambulatory Visit: Payer: Self-pay

## 2021-06-04 MED ORDER — ALPRAZOLAM 0.5 MG PO TABS: ORAL_TABLET | ORAL | 3 refills | Status: DC

## 2021-06-06 ENCOUNTER — Other Ambulatory Visit: Payer: Self-pay

## 2021-06-15 ENCOUNTER — Other Ambulatory Visit: Payer: Self-pay

## 2021-06-15 ENCOUNTER — Emergency Department: Payer: 59

## 2021-06-15 ENCOUNTER — Inpatient Hospital Stay
Admission: EM | Admit: 2021-06-15 | Discharge: 2021-06-19 | DRG: 917 | Disposition: A | Discharge status: Home / Self Care | Payer: 59 | Attending: Student in an Organized Health Care Education/Training Program | Admitting: Student in an Organized Health Care Education/Training Program

## 2021-06-15 DIAGNOSIS — F419 Anxiety disorder, unspecified: Secondary | ICD-10-CM | POA: Diagnosis present

## 2021-06-15 DIAGNOSIS — Z803 Family history of malignant neoplasm of breast: Secondary | ICD-10-CM

## 2021-06-15 DIAGNOSIS — I2510 Atherosclerotic heart disease of native coronary artery without angina pectoris: Secondary | ICD-10-CM | POA: Diagnosis not present

## 2021-06-15 DIAGNOSIS — R079 Chest pain, unspecified: Secondary | ICD-10-CM | POA: Diagnosis not present

## 2021-06-15 DIAGNOSIS — Z20822 Contact with and (suspected) exposure to covid-19: Secondary | ICD-10-CM | POA: Diagnosis not present

## 2021-06-15 DIAGNOSIS — J449 Chronic obstructive pulmonary disease, unspecified: Secondary | ICD-10-CM | POA: Diagnosis present

## 2021-06-15 DIAGNOSIS — T443X1A Poisoning by other parasympatholytics [anticholinergics and antimuscarinics] and spasmolytics, accidental (unintentional), initial encounter: Secondary | ICD-10-CM | POA: Diagnosis present

## 2021-06-15 DIAGNOSIS — N183 Chronic kidney disease, stage 3 unspecified: Secondary | ICD-10-CM | POA: Diagnosis not present

## 2021-06-15 DIAGNOSIS — J438 Other emphysema: Secondary | ICD-10-CM | POA: Diagnosis not present

## 2021-06-15 DIAGNOSIS — N3000 Acute cystitis without hematuria: Secondary | ICD-10-CM | POA: Diagnosis not present

## 2021-06-15 DIAGNOSIS — I129 Hypertensive chronic kidney disease with stage 1 through stage 4 chronic kidney disease, or unspecified chronic kidney disease: Secondary | ICD-10-CM | POA: Diagnosis present

## 2021-06-15 DIAGNOSIS — E1122 Type 2 diabetes mellitus with diabetic chronic kidney disease: Secondary | ICD-10-CM | POA: Diagnosis present

## 2021-06-15 DIAGNOSIS — I252 Old myocardial infarction: Secondary | ICD-10-CM | POA: Diagnosis not present

## 2021-06-15 DIAGNOSIS — Z79899 Other long term (current) drug therapy: Secondary | ICD-10-CM

## 2021-06-15 DIAGNOSIS — R000 Tachycardia, unspecified: Secondary | ICD-10-CM | POA: Diagnosis not present

## 2021-06-15 DIAGNOSIS — Z888 Allergy status to other drugs, medicaments and biological substances status: Secondary | ICD-10-CM | POA: Diagnosis not present

## 2021-06-15 DIAGNOSIS — E872 Acidosis, unspecified: Secondary | ICD-10-CM | POA: Diagnosis not present

## 2021-06-15 DIAGNOSIS — G928 Other toxic encephalopathy: Secondary | ICD-10-CM | POA: Diagnosis present

## 2021-06-15 DIAGNOSIS — F32A Depression, unspecified: Secondary | ICD-10-CM | POA: Diagnosis present

## 2021-06-15 DIAGNOSIS — Z955 Presence of coronary angioplasty implant and graft: Secondary | ICD-10-CM | POA: Diagnosis not present

## 2021-06-15 DIAGNOSIS — R410 Disorientation, unspecified: Secondary | ICD-10-CM

## 2021-06-15 DIAGNOSIS — R339 Retention of urine, unspecified: Secondary | ICD-10-CM | POA: Diagnosis present

## 2021-06-15 DIAGNOSIS — R531 Weakness: Secondary | ICD-10-CM

## 2021-06-15 DIAGNOSIS — R338 Other retention of urine: Secondary | ICD-10-CM | POA: Diagnosis not present

## 2021-06-15 DIAGNOSIS — E1121 Type 2 diabetes mellitus with diabetic nephropathy: Secondary | ICD-10-CM | POA: Diagnosis not present

## 2021-06-15 DIAGNOSIS — N179 Acute kidney failure, unspecified: Secondary | ICD-10-CM

## 2021-06-15 DIAGNOSIS — G894 Chronic pain syndrome: Secondary | ICD-10-CM | POA: Diagnosis present

## 2021-06-15 DIAGNOSIS — E0590 Thyrotoxicosis, unspecified without thyrotoxic crisis or storm: Secondary | ICD-10-CM | POA: Diagnosis present

## 2021-06-15 DIAGNOSIS — E8809 Other disorders of plasma-protein metabolism, not elsewhere classified: Secondary | ICD-10-CM | POA: Diagnosis not present

## 2021-06-15 DIAGNOSIS — E1129 Type 2 diabetes mellitus with other diabetic kidney complication: Secondary | ICD-10-CM | POA: Diagnosis present

## 2021-06-15 DIAGNOSIS — E7800 Pure hypercholesterolemia, unspecified: Secondary | ICD-10-CM | POA: Diagnosis present

## 2021-06-15 DIAGNOSIS — N19 Unspecified kidney failure: Secondary | ICD-10-CM | POA: Diagnosis not present

## 2021-06-15 DIAGNOSIS — Z8249 Family history of ischemic heart disease and other diseases of the circulatory system: Secondary | ICD-10-CM | POA: Diagnosis not present

## 2021-06-15 DIAGNOSIS — N1831 Chronic kidney disease, stage 3a: Secondary | ICD-10-CM | POA: Diagnosis not present

## 2021-06-15 DIAGNOSIS — Z981 Arthrodesis status: Secondary | ICD-10-CM

## 2021-06-15 DIAGNOSIS — Z87891 Personal history of nicotine dependence: Secondary | ICD-10-CM | POA: Diagnosis not present

## 2021-06-15 DIAGNOSIS — I10 Essential (primary) hypertension: Secondary | ICD-10-CM | POA: Diagnosis not present

## 2021-06-15 DIAGNOSIS — M069 Rheumatoid arthritis, unspecified: Secondary | ICD-10-CM | POA: Diagnosis present

## 2021-06-15 DIAGNOSIS — R0789 Other chest pain: Secondary | ICD-10-CM | POA: Diagnosis not present

## 2021-06-15 DIAGNOSIS — Z801 Family history of malignant neoplasm of trachea, bronchus and lung: Secondary | ICD-10-CM

## 2021-06-15 DIAGNOSIS — E876 Hypokalemia: Secondary | ICD-10-CM | POA: Diagnosis not present

## 2021-06-15 DIAGNOSIS — T450X1A Poisoning by antiallergic and antiemetic drugs, accidental (unintentional), initial encounter: Secondary | ICD-10-CM | POA: Diagnosis not present

## 2021-06-15 DIAGNOSIS — E611 Iron deficiency: Secondary | ICD-10-CM | POA: Diagnosis present

## 2021-06-15 DIAGNOSIS — R400 Somnolence: Secondary | ICD-10-CM | POA: Diagnosis not present

## 2021-06-15 DIAGNOSIS — R0902 Hypoxemia: Secondary | ICD-10-CM | POA: Diagnosis not present

## 2021-06-15 DIAGNOSIS — R109 Unspecified abdominal pain: Secondary | ICD-10-CM | POA: Diagnosis not present

## 2021-06-15 DIAGNOSIS — Z7982 Long term (current) use of aspirin: Secondary | ICD-10-CM

## 2021-06-15 DIAGNOSIS — R4182 Altered mental status, unspecified: Secondary | ICD-10-CM | POA: Diagnosis present

## 2021-06-15 DIAGNOSIS — E041 Nontoxic single thyroid nodule: Secondary | ICD-10-CM | POA: Diagnosis not present

## 2021-06-15 LAB — URINALYSIS, ROUTINE W REFLEX MICROSCOPIC
Bilirubin Urine: NEGATIVE
Glucose, UA: NEGATIVE mg/dL
Hgb urine dipstick: NEGATIVE
Ketones, ur: NEGATIVE mg/dL
Leukocytes,Ua: NEGATIVE
Nitrite: NEGATIVE
Protein, ur: 100 mg/dL — AB
Specific Gravity, Urine: 1.013 (ref 1.005–1.030)
pH: 5 (ref 5.0–8.0)

## 2021-06-15 LAB — CBC WITH DIFFERENTIAL/PLATELET
Abs Immature Granulocytes: 0.28 10*3/uL — ABNORMAL HIGH (ref 0.00–0.07)
Basophils Absolute: 0.1 10*3/uL (ref 0.0–0.1)
Basophils Relative: 0 %
Eosinophils Absolute: 0 10*3/uL (ref 0.0–0.5)
Eosinophils Relative: 0 %
HCT: 38.3 % (ref 36.0–46.0)
Hemoglobin: 11.4 g/dL — ABNORMAL LOW (ref 12.0–15.0)
Immature Granulocytes: 1 %
Lymphocytes Relative: 3 %
Lymphs Abs: 0.9 10*3/uL (ref 0.7–4.0)
MCH: 26.5 pg (ref 26.0–34.0)
MCHC: 29.8 g/dL — ABNORMAL LOW (ref 30.0–36.0)
MCV: 89.1 fL (ref 80.0–100.0)
Monocytes Absolute: 1.5 10*3/uL — ABNORMAL HIGH (ref 0.1–1.0)
Monocytes Relative: 6 %
Neutro Abs: 24.6 10*3/uL — ABNORMAL HIGH (ref 1.7–7.7)
Neutrophils Relative %: 90 %
Platelets: 482 10*3/uL — ABNORMAL HIGH (ref 150–400)
RBC: 4.3 MIL/uL (ref 3.87–5.11)
RDW: 16.5 % — ABNORMAL HIGH (ref 11.5–15.5)
WBC: 27.4 10*3/uL — ABNORMAL HIGH (ref 4.0–10.5)
nRBC: 0 % (ref 0.0–0.2)

## 2021-06-15 LAB — TROPONIN I (HIGH SENSITIVITY)
Troponin I (High Sensitivity): 14 ng/L (ref <18)
Troponin I (High Sensitivity): 12 ng/L (ref <18)

## 2021-06-15 LAB — CBC
HCT: 35.1 % — ABNORMAL LOW (ref 36.0–46.0)
Hemoglobin: 10.4 g/dL — ABNORMAL LOW (ref 12.0–15.0)
MCH: 26.9 pg (ref 26.0–34.0)
MCHC: 29.6 g/dL — ABNORMAL LOW (ref 30.0–36.0)
MCV: 90.9 fL (ref 80.0–100.0)
Platelets: 418 10*3/uL — ABNORMAL HIGH (ref 150–400)
RBC: 3.86 MIL/uL — ABNORMAL LOW (ref 3.87–5.11)
RDW: 16.4 % — ABNORMAL HIGH (ref 11.5–15.5)
WBC: 23.1 10*3/uL — ABNORMAL HIGH (ref 4.0–10.5)
nRBC: 0 % (ref 0.0–0.2)

## 2021-06-15 LAB — BASIC METABOLIC PANEL
Anion gap: 13 (ref 5–15)
BUN: 90 mg/dL — ABNORMAL HIGH (ref 8–23)
CO2: 9 mmol/L — ABNORMAL LOW (ref 22–32)
Calcium: 6.3 mg/dL — CL (ref 8.9–10.3)
Chloride: 111 mmol/L (ref 98–111)
Creatinine, Ser: 3.98 mg/dL — ABNORMAL HIGH (ref 0.44–1.00)
GFR, Estimated: 12 mL/min — ABNORMAL LOW (ref >60)
Glucose, Bld: 176 mg/dL — ABNORMAL HIGH (ref 70–99)
Potassium: 4.9 mmol/L (ref 3.5–5.1)
Sodium: 133 mmol/L — ABNORMAL LOW (ref 135–145)

## 2021-06-15 LAB — LACTIC ACID, PLASMA
Lactic Acid, Venous: 0.7 mmol/L (ref 0.5–1.9)
Lactic Acid, Venous: 0.8 mmol/L (ref 0.5–1.9)

## 2021-06-15 LAB — COMPREHENSIVE METABOLIC PANEL
ALT: 9 U/L (ref 0–44)
AST: 10 U/L — ABNORMAL LOW (ref 15–41)
Albumin: 3.7 g/dL (ref 3.5–5.0)
Alkaline Phosphatase: 124 U/L (ref 38–126)
Anion gap: 15 (ref 5–15)
BUN: 93 mg/dL — ABNORMAL HIGH (ref 8–23)
CO2: 8 mmol/L — ABNORMAL LOW (ref 22–32)
Calcium: 7 mg/dL — ABNORMAL LOW (ref 8.9–10.3)
Chloride: 109 mmol/L (ref 98–111)
Creatinine, Ser: 4.24 mg/dL — ABNORMAL HIGH (ref 0.44–1.00)
GFR, Estimated: 11 mL/min — ABNORMAL LOW (ref >60)
Glucose, Bld: 181 mg/dL — ABNORMAL HIGH (ref 70–99)
Potassium: 5.1 mmol/L (ref 3.5–5.1)
Sodium: 132 mmol/L — ABNORMAL LOW (ref 135–145)
Total Bilirubin: 0.7 mg/dL (ref 0.3–1.2)
Total Protein: 8.2 g/dL — ABNORMAL HIGH (ref 6.5–8.1)

## 2021-06-15 LAB — RESP PANEL BY RT-PCR (FLU A&B, COVID) ARPGX2
Influenza A by PCR: NEGATIVE
Influenza B by PCR: NEGATIVE
SARS Coronavirus 2 by RT PCR: NEGATIVE

## 2021-06-15 LAB — SALICYLATE LEVEL: Salicylate Lvl: <7 mg/dL — ABNORMAL LOW (ref 7.0–30.0)

## 2021-06-15 LAB — ACETAMINOPHEN LEVEL: Acetaminophen (Tylenol), Serum: <10 ug/mL — ABNORMAL LOW (ref 10–30)

## 2021-06-15 MED ORDER — SODIUM CHLORIDE 0.9 % IV SOLN
1.0000 g | INTRAVENOUS | Status: DC
  Administered 2021-06-16: 1 g via INTRAVENOUS
  Filled 2021-06-15: qty 1
  Filled 2021-06-15: qty 10

## 2021-06-15 MED ORDER — HEPARIN SODIUM (PORCINE) 5000 UNIT/ML IJ SOLN
5000.0000 [IU] | Freq: Three times a day (TID) | INTRAMUSCULAR | Status: DC
  Administered 2021-06-15: 5000 [IU] via SUBCUTANEOUS
  Filled 2021-06-15: qty 1

## 2021-06-15 MED ORDER — ACETAMINOPHEN 650 MG RE SUPP: 650.0000 mg | Freq: Four times a day (QID) | RECTAL | Status: DC | PRN

## 2021-06-15 MED ORDER — CARVEDILOL 3.125 MG PO TABS
3.1250 mg | ORAL_TABLET | Freq: Two times a day (BID) | ORAL | Status: DC
  Administered 2021-06-15 – 2021-06-17 (×5): 3.125 mg via ORAL
  Filled 2021-06-15 (×5): qty 1

## 2021-06-15 MED ORDER — SODIUM BICARBONATE 8.4 % IV SOLN
INTRAVENOUS | Status: DC
  Filled 2021-06-15: qty 1000
  Filled 2021-06-15: qty 150

## 2021-06-15 MED ORDER — SODIUM CHLORIDE 0.9 % IV BOLUS
1000.0000 mL | Freq: Once | INTRAVENOUS | Status: AC
  Administered 2021-06-15: 1000 mL via INTRAVENOUS

## 2021-06-15 MED ORDER — CALCIUM GLUCONATE-NACL 1-0.675 GM/50ML-% IV SOLN
1.0000 g | Freq: Once | INTRAVENOUS | Status: AC
  Administered 2021-06-15: 1000 mg via INTRAVENOUS
  Filled 2021-06-15: qty 50

## 2021-06-15 MED ORDER — STERILE WATER FOR INJECTION IV SOLN
INTRAMUSCULAR | Status: AC
  Filled 2021-06-15: qty 1000

## 2021-06-15 MED ORDER — ACETAMINOPHEN 325 MG PO TABS
650.0000 mg | ORAL_TABLET | Freq: Four times a day (QID) | ORAL | Status: DC | PRN
  Administered 2021-06-19: 650 mg via ORAL
  Filled 2021-06-15: qty 2

## 2021-06-15 MED ORDER — CEFTRIAXONE SODIUM 2 G IJ SOLR
2.0000 g | Freq: Once | INTRAMUSCULAR | Status: AC
Start: 2021-06-15 — End: 2021-06-15
  Administered 2021-06-15: 2 g via INTRAVENOUS
  Filled 2021-06-15: qty 20

## 2021-06-15 MED ORDER — PNEUMOCOCCAL 20-VAL CONJ VACC 0.5 ML IM SUSY
0.5000 mL | PREFILLED_SYRINGE | INTRAMUSCULAR | Status: DC
  Filled 2021-06-15: qty 0.5

## 2021-06-15 MED ORDER — MORPHINE SULFATE (PF) 2 MG/ML IV SOLN: 2.0000 mg | INTRAVENOUS | Status: DC | PRN

## 2021-06-15 MED ORDER — SODIUM CHLORIDE 0.9% FLUSH
3.0000 mL | Freq: Two times a day (BID) | INTRAVENOUS | Status: DC
  Administered 2021-06-16 – 2021-06-19 (×7): 3 mL via INTRAVENOUS

## 2021-06-15 MED ORDER — HYDROCODONE-ACETAMINOPHEN 5-325 MG PO TABS: 1.0000 | ORAL_TABLET | ORAL | Status: DC | PRN

## 2021-06-15 NOTE — Assessment & Plan Note (Addendum)
Sinus tach on initial ekg. ?Troponin negative.  ?

## 2021-06-15 NOTE — ED Notes (Signed)
RN attempted to give ICU handoff. Per Erica,RN in ICU, she will need talk to ED charge. ?

## 2021-06-15 NOTE — ED Notes (Signed)
Pt knows need for ua . 

## 2021-06-15 NOTE — ED Triage Notes (Signed)
Pt arrives via EMS from home for increased weakness the past couple days- pt stated she had a fever x 5days- pt has been taking 10 benadryl tablets a day for the last 3 days- pt has not had any falls but is complaining of back pain ?

## 2021-06-15 NOTE — Assessment & Plan Note (Addendum)
.    Stable.  Albuterol. ? cont PRN oxygen 1-2 L. ? ?

## 2021-06-15 NOTE — Assessment & Plan Note (Addendum)
.    Patient has longstanding iron deficiency and chronic anemia which I suspect is contributing to her generalized weakness. ?We will type and screen, IV PPI, transfuse as deemed appropriate. ?

## 2021-06-15 NOTE — H&P (Signed)
?History and Physical  ? ? ?Patient: Whitney Bernard [id number] DOB: 1961/01/22 ?DOA: 06/15/2021 ?DOS: the patient was seen and examined on 06/16/2021 ?PCP: Lorelee Market, MD  ?Patient coming from: Home ? ?Chief Complaint:  ?Chief Complaint  ?Patient presents with  ? Weakness  ? ?HPI: Whitney Bernard is a 61 y.o. female with medical history significant of allergies to almond oil, atorvastatin, rosuvastatin, past medical history also of acute pancreatitis heart disease chronic kidney disease, COPD, diabetes mellitus type 2, hyperlipidemia, rheumatoid arthritis presenting with generalized weakness.  Patient is confused and disoriented and is not able to give HPI.  Per report patient has been taking excessive doses of Benadryl to make herself feel better and had difficulty ambulating today and came to the hospital.  Patient on initial presentation does meet sepsis criteria although source of infection is still not definite. ? ? ?Review of Systems  ?All other systems reviewed and are negative. ? ?Past Medical History:  ?Diagnosis Date  ? Acute pancreatitis   ? CAD (coronary artery disease)   ? a. 08/2017 Neg MV; b. 01/2019 CTA Chest: Mild cor Ca2+; c. 02/2019 NSTEMI/PCI: LCX 95p/m (2.75x15 Resolute Onyx DES); d. 05/2019 Cath: LM nl, LAD nl, LCX patent stent, RCA 30ost/p-->Med rx.  ? Chronic pain syndrome   ? CKD (chronic kidney disease), stage III ([place])   ? COPD (chronic obstructive pulmonary disease) ([place])   ? Depression   ? Diabetes mellitus without complication ([place])   ? Headache(784.0)   ? migraines  ? History of echocardiogram   ? a. 04/2018 Echo: EF 55-60%. No significant valvular dzs; b. 02/2019 Echo: EF 60-65%. Nl RV fxn.  ? Hypercholesteremia   ? Hypertension   ? Hyperthyroidism   ? Hypokalemia   ? Migraine   ? Narcotic abuse ([place])   ? RA (rheumatoid arthritis) ([place])   ? ?Past Surgical History:  ?Procedure Laterality Date  ? ANTERIOR CERVICAL DECOMP/DISCECTOMY FUSION N/A 11/26/2012  ? Procedure: ANTERIOR CERVICAL  DECOMPRESSION/DISCECTOMY FUSION 2 LEVELS;  Surgeon: Faythe Ghee, MD;  Location: [facility];  Service: Neurosurgery;  Laterality: N/A;  ANTERIOR CERVICAL DECOMPRESSION/DISCECTOMY FUSION 2 LEVELS  ? CERVICAL POLYPECTOMY    ? CHOLECYSTECTOMY    ? CORONARY STENT INTERVENTION N/A 02/23/2019  ? Procedure: CORONARY STENT INTERVENTION;  Surgeon: Nelva Bush, MD;  Location: [facility];  Service: Cardiovascular;  Laterality: N/A;  ? ESOPHAGOGASTRODUODENOSCOPY N/A 12/26/2014  ? Procedure: ESOPHAGOGASTRODUODENOSCOPY (EGD);  Surgeon: Lucilla Lame, MD;  Location: [facility];  Service: Endoscopy;  Laterality: N/A;  ? FRACTURE SURGERY    ? LEFT HEART CATH AND CORONARY ANGIOGRAPHY N/A 02/23/2019  ? Procedure: LEFT HEART CATH AND CORONARY ANGIOGRAPHY;  Surgeon: Minna Merritts, MD;  Location: [place] [facility];  Service: Cardiovascular;  Laterality: N/A;  ? LEFT HEART CATH AND CORONARY ANGIOGRAPHY N/A 06/06/2019  ? Procedure: LEFT HEART CATH AND CORONARY ANGIOGRAPHY;  Surgeon: Wellington Hampshire, MD;  Location: [place] [facility];  Service: Cardiovascular;  Laterality: N/A;  ? NECK SURGERY    ? ORIF WRIST FRACTURE Right 03/28/2020  ? Procedure: OPEN REDUCTION INTERNAL FIXATION OF RIGHT DISTAL RADIUS FRACTURE;  Surgeon: Corky Mull, MD;  Location: [facility];  Service: Orthopedics;  Laterality: Right;  ? TUBAL LIGATION    ? WISDOM TOOTH EXTRACTION    ? ?Social History:  reports that she quit smoking about 8 years ago. Her smoking use included cigarettes. She has a 19.00 pack-year smoking history. She has never used smokeless tobacco.  She reports that she does not currently use alcohol. She reports that she does not use drugs. ? ?Allergies  ?Allergen Reactions  ? Almond Oil Hives  ? Atorvastatin Other (See Comments)  ?  myalgias  ? Rosuvastatin Other (See Comments)  ?  Muscle pain  ? ? ?Family History  ?Problem Relation Age of Onset  ? CAD Father   ? Alzheimer's disease Other   ? Breast cancer Paternal Aunt   ?      another aunt- Polycythemia vera.   ? Breast cancer Cousin   ? Lung cancer Paternal Uncle   ? ? ?Prior to Admission medications   ?Medication Sig Start Date End Date Taking? Authorizing Provider  ?ALPRAZolam (XANAX) 0.5 MG tablet Take 0.5 mg by mouth daily. 03/16/20   [provider]  ?ALPRAZolam Duanne Moron) 0.5 MG tablet Take 1 tablet by mouth every day for mood disorder 11/08/20     ?ALPRAZolam (XANAX) 0.5 MG tablet take 1 tablet by oral route 2 times every day 06/04/21     ?ARIPiprazole (ABILIFY) 2 MG tablet take 0.5 tablet by oral route  every day for 2 weeks then increase to 1 po q day 06/03/21     ?aspirin EC 81 MG EC tablet Take 1 tablet (81 mg total) by mouth daily. 06/07/19   Jennye Boroughs, MD  ?atorvastatin (LIPITOR) 10 MG tablet take 1 tablet by mouth every day 07/05/20     ?atorvastatin (LIPITOR) 40 MG tablet take 1 tablet by oral route  every day 01/29/21     ?busPIRone (BUSPAR) 10 MG tablet Take 10 mg by mouth 2 (two) times daily.  04/26/18   [provider]  ?busPIRone (BUSPAR) 10 MG tablet take 1 tablet by oral route 3 times every day 11/08/20     ?butalbital-acetaminophen-caffeine (FIORICET) 50-325-40 MG tablet Take 1 tablet by mouth every 8 hours as needed 04/25/21     ?butalbital-acetaminophen-caffeine (FIORICET) 50-325-40 MG tablet Take 1 tablet by mouth every 8 hours as needed 11/09/20     ?Butalbital-APAP-Caffeine 50-325-40 MG capsule Take 1 tablet by mouth 3 (three) times daily. 04/26/18   [provider]  ?carvedilol (COREG) 3.125 MG tablet Take 1 tablet (3.125 mg total) by mouth 2 (two) times daily. ?Patient not taking: Reported on 03/27/2020 10/19/19   [name], NP  ?ezetimibe (ZETIA) 10 MG tablet Take 1 tablet (10 mg total) by mouth daily. 02/26/19   Damita Lack, MD  ?FLUoxetine (PROZAC) 20 MG capsule Take 1 capsule by mouth every day in the morning 11/08/20     ?FLUoxetine (PROZAC) 20 MG capsule take 1 capsule by mouth every day in the morning 01/29/21      ?furosemide (LASIX) 40 MG tablet Take 40 mg by mouth 2 (two) times daily.     [provider]  ?furosemide (LASIX) 40 MG tablet TAKE 1 TABLET BY MOUTH TWICE DAILY 03/13/20 03/13/21  Lorelee Market, MD  ?furosemide (LASIX) 40 MG tablet TAKE 1 TABLET BY MOUTH TWICE DAILY 05/11/21     ?ibuprofen (ADVIL) 200 MG tablet Take 600 mg by mouth every 4 (four) hours as needed for mild pain or moderate pain.    [provider]  ?lisinopril (ZESTRIL) 20 MG tablet take 1 tablet by oral route  every day 07/09/20     ?metFORMIN (GLUCOPHAGE) 500 MG tablet take 1 tablet by mouth every day with a meal 07/05/20     ?mirtazapine (REMERON) 30 MG tablet Take 30 mg by mouth at bedtime.  03/16/20   [provider]  ?mirtazapine (REMERON) 30 MG tablet TAKE 1 TABLET BY MOUTH EVERY DAY BEFORE BEDTIME AS NEEDED 12/23/19 12/22/20  Martin Majestic, FNP  ?Multiple Vitamins-Minerals (MULTIVITAMIN WITH MINERALS) tablet Take 1 tablet by mouth daily.    [provider]  ?omeprazole (PRILOSEC) 20 MG capsule Take 20 mg by mouth daily.    [provider]  ?PARoxetine (PAXIL) 40 MG tablet Take 40 mg by mouth daily.    [provider]  ?PARoxetine (PAXIL) 40 MG tablet TAKE 1 TABLET BY MOUTH EVERY DAY 11/26/19 11/25/20  Martin Majestic, FNP  ?PARoxetine (PAXIL) 40 MG tablet take 1 tablet by mouth every day 07/05/20     ?PARoxetine (PAXIL) 40 MG tablet take 1 tablet by mouth every day 08/23/20     ?potassium chloride SA (KLOR-CON M20) 20 MEQ tablet take 1 tablet by mouth 2 times a day with food 02/27/21     ?potassium chloride SA (KLOR-CON) 20 MEQ tablet Take 1 tablet (20 mEq total) by mouth 2 (two) times daily. 06/07/19   Jennye Boroughs, MD  ?potassium chloride SA (KLOR-CON) 20 MEQ tablet TAKE 1 TABLET BY MOUTH EVERY DAY WITH FOOD 12/23/19 12/22/20  Martin Majestic, FNP  ?potassium chloride SA (KLOR-CON) 20 MEQ tablet TAKE 1 TABLET BY MOUTH TWICE DAILY 10/11/19 10/10/20  Lorelee Market, MD   ?triamcinolone cream (KENALOG) 0.1 % Apply by topical route 2 times every day a thin layer to the affected area(s) 09/10/20     ?Blood work today shows sodium of 132 glucose 181 acute kidney injury on CKD with a crea

## 2021-06-15 NOTE — ED Notes (Signed)
Pt unable to void. MD aware.

## 2021-06-15 NOTE — ED Notes (Signed)
Pt placed on 1l/min per resp with a verbal order from provider. ?

## 2021-06-15 NOTE — ED Provider Notes (Signed)
? ?[facility] ?Provider Note ? ? ? Event Date/Time  ? First MD Initiated Contact with Patient 06/15/21 1645   ?  (approximate) ? ? ?History  ? ?Weakness ? ? ?HPI ? ?Whitney Bernard is a 61 y.o. female with past medical history of hypertension, COPD, diabetes, here with generalized weakness.  History is somewhat limited due to mild confusion.  However, she states that over the last several days, the patient has had decreased appetite, general fatigue, and just has not wanted to eat.  She has had some loose stools.  She states that she has been taking Benadryl because it "makes her feel better."  Denies any ongoing chest or abdominal pain.  Denies any medication changes.  She states that she feels generally weak, lightheaded when standing.  She has had difficulty getting around the house today.  She states she had difficulty with her memory as well. ?  ? ? ?Physical Exam  ? ?Triage Vital Signs: ?ED Triage Vitals  ?Enc Vitals Group  ?   BP 06/15/21 1638 123/60  ?   Pulse Rate 06/15/21 1635 (!) 114  ?   Resp 06/15/21 1635 19  ?   Temp 06/15/21 1636 98 ?F (36.7 ?C)  ?   Temp Source 06/15/21 1636 Oral  ?   SpO2 06/15/21 1635 100 %  ?   Weight 06/15/21 1635 145 lb (65.8 kg)  ?   Height 06/15/21 1635  (1.626 m)  ?   Head Circumference --   ?   Peak Flow --   ?   Pain Score 06/15/21 1635 5  ?   Pain Loc --   ?   Pain Edu? --   ?   Excl. in GC? --   ? ? ?Most recent vital signs: ?Vitals:  ? 06/15/21 2354 06/16/21 0000  ?BP:  101/70  ?Pulse:  (!) 109  ?Resp:  17  ?Temp: 98.9 ?F (37.2 ?C)   ?SpO2:  100%  ? ? ? ?General: Awake, no distress.  ?CV:  Good peripheral perfusion.  Tachycardic.  No murmurs. ?Resp:  Normal effort.  Tachypnea but lungs are clear bilaterally. ?Abd:  No distention.  No tenderness. ?Other:  Markedly dry mucous membranes.  Poor skin turgor. ? ? ?ED Results / Procedures / Treatments  ? ?Labs ?(all labs ordered are listed, but only abnormal results are displayed) ?Labs Reviewed  ?MRSA  NEXT GEN BY PCR, NASAL - Abnormal; Notable for the following components:  ?    Result Value  ? MRSA by PCR Next Gen DETECTED (*)   ? All other components within normal limits  ?URINALYSIS, ROUTINE W REFLEX MICROSCOPIC - Abnormal; Notable for the following components:  ? Color, Urine YELLOW (*)   ? APPearance CLEAR (*)   ? Protein, ur 100 (*)   ? Bacteria, UA RARE (*)   ? All other components within normal limits  ?COMPREHENSIVE METABOLIC PANEL - Abnormal; Notable for the following components:  ? Sodium 132 (*)   ? CO2 8 (*)   ? Glucose, Bld 181 (*)   ? BUN 93 (*)   ? Creatinine, Ser 4.24 (*)   ? Calcium 7.0 (*)   ? Total Protein 8.2 (*)   ? AST 10 (*)   ? GFR, Estimated 11 (*)   ? All other components within normal limits  ?CBC WITH DIFFERENTIAL/PLATELET - Abnormal; Notable for the following components:  ? WBC 27.4 (*)   ? Hemoglobin 11.4 (*)   ?  MCHC 29.8 (*)   ? RDW 16.5 (*)   ? Platelets 482 (*)   ? Neutro Abs 24.6 (*)   ? Monocytes Absolute 1.5 (*)   ? Abs Immature Granulocytes 0.28 (*)   ? All other components within normal limits  ?BASIC METABOLIC PANEL - Abnormal; Notable for the following components:  ? Sodium 133 (*)   ? CO2 9 (*)   ? Glucose, Bld 176 (*)   ? BUN 90 (*)   ? Creatinine, Ser 3.98 (*)   ? Calcium 6.3 (*)   ? GFR, Estimated 12 (*)   ? All other components within normal limits  ?CBC - Abnormal; Notable for the following components:  ? WBC 23.1 (*)   ? RBC 3.86 (*)   ? Hemoglobin 10.4 (*)   ? HCT 35.1 (*)   ? MCHC 29.6 (*)   ? RDW 16.4 (*)   ? Platelets 418 (*)   ? All other components within normal limits  ?COMPREHENSIVE METABOLIC PANEL - Abnormal; Notable for the following components:  ? Chloride 113 (*)   ? CO2 11 (*)   ? Glucose, Bld 163 (*)   ? BUN 85 (*)   ? Creatinine, Ser 3.69 (*)   ? Calcium 6.5 (*)   ? Albumin 3.1 (*)   ? AST 9 (*)   ? GFR, Estimated 13 (*)   ? All other components within normal limits  ?CBC - Abnormal; Notable for the following components:  ? WBC 21.2 (*)   ? RBC 3.77  (*)   ? Hemoglobin 10.0 (*)   ? HCT 33.5 (*)   ? MCHC 29.9 (*)   ? RDW 16.5 (*)   ? All other components within normal limits  ?SALICYLATE LEVEL - Abnormal; Notable for the following components:  ? Salicylate Lvl <7.0 (*)   ? All other components within normal limits  ?ACETAMINOPHEN LEVEL - Abnormal; Notable for the following components:  ? Acetaminophen (Tylenol), Serum <10 (*)   ? All other components within normal limits  ?BLOOD GAS, VENOUS - Abnormal; Notable for the following components:  ? pH, Ven 7.19 (*)   ? pCO2, Ven 27 (*)   ? Bicarbonate 10.3 (*)   ? Acid-base deficit 16.4 (*)   ? All other components within normal limits  ?CULTURE, BLOOD (ROUTINE X 2)  ?RESP PANEL BY RT-PCR (FLU A&B, COVID) [id number]  ?CULTURE, BLOOD (ROUTINE X 2)  ?URINE CULTURE  ?LACTIC ACID, PLASMA  ?LACTIC ACID, PLASMA  ?HIV ANTIBODY (ROUTINE TESTING W REFLEX)  ?CK  ?TROPONIN I (HIGH SENSITIVITY)  ?TROPONIN I (HIGH SENSITIVITY)  ? ? ? ?EKG ?Sinus tachycardia, ventricular rate 114.  PR 177, QRS 82, QTc 466.  No acute ST elevations or depressions.  No EKG evidence of acute ischemia or infarct.  Diffuse low voltage. ? ? ?RADIOLOGY ?CT abdomen/pelvis: No acute abnormality, no obstruction ?Chest x-ray: Mild bibasilar atelectasis ? ? ?I also independently reviewed and agree with radiologist interpretations. ? ? ?PROCEDURES: ? ?Critical Care performed: Yes, see critical care procedure note(s) ? ?.Critical Care ?Performed by: Shaune Pollack, MD ?Authorized by: Shaune Pollack, MD  ? ?Critical care provider statement:  ?  Critical care time (minutes):  30 ?  Critical care time was exclusive of:  Separately billable procedures and treating other patients ?  Critical care was necessary to treat or prevent imminent or life-threatening deterioration of the following conditions:  Cardiac failure, circulatory failure, respiratory failure and metabolic crisis ?  Critical care was  time spent personally by me on the following activities:  Development of  treatment plan with patient or surrogate, discussions with consultants, evaluation of patient's response to treatment, examination of patient, ordering and review of laboratory studies, ordering and review of radiographic studies, ordering and performing treatments and interventions, pulse oximetry, re-evaluation of patient's condition and review of old charts ? ? ? ?MEDICATIONS ORDERED IN ED: ?Medications  ?sodium bicarbonate 150 mEq in sterile water 1,150 mL infusion ( Intravenous Not Given 06/15/21 1829)  ?sodium bicarbonate 150 mEq in dextrose 5 % 1,150 mL infusion ( Intravenous New Bag/Given 06/15/21 2135)  ?cefTRIAXone (ROCEPHIN) 1 g in sodium chloride 0.9 % 100 mL IVPB (has no administration in time range)  ?carvedilol (COREG) tablet 3.125 mg (3.125 mg Oral Given 06/15/21 2234)  ?heparin injection 5,000 Units (5,000 Units Subcutaneous Given 06/15/21 2234)  ?sodium chloride flush (NS) 0.9 % injection 3 mL (3 mLs Intravenous Not Given 06/15/21 2150)  ?acetaminophen (TYLENOL) tablet 650 mg (has no administration in time range)  ?  Or  ?acetaminophen (TYLENOL) suppository 650 mg (has no administration in time range)  ?HYDROcodone-acetaminophen (NORCO/VICODIN) 5-325 MG per tablet 1 tablet (has no administration in time range)  ?morphine (PF) 2 MG/ML injection 2 mg (has no administration in time range)  ?pneumococcal 20-valent conjugate vaccine (PREVNAR 20) injection 0.5 mL (has no administration in time range)  ?lactated ringers infusion (has no administration in time range)  ?sodium chloride 0.9 % bolus 1,000 mL (0 mLs Intravenous Stopped 06/15/21 2121)  ?sodium chloride 0.9 % bolus 1,000 mL (1,000 mLs Intravenous New Bag/Given 06/15/21 2003)  ?cefTRIAXone (ROCEPHIN) 2 g in sodium chloride 0.9 % 100 mL IVPB (0 g Intravenous Stopped 06/15/21 2121)  ?calcium gluconate 1 g/ 50 mL sodium chloride IVPB (1,000 mg Intravenous New Bag/Given 06/15/21 2354)  ? ? ? ?IMPRESSION / MDM / ASSESSMENT AND PLAN / ED COURSE  ?I reviewed the  triage vital signs and the nursing notes. ?             ?               ? ? ?MDM:  ?61 yo F with extensive PMHx of CKD, CHF, COPD, DM2, HLD, RA, here with generalized weakness. Pt markedly dehydrated

## 2021-06-15 NOTE — ED Notes (Signed)
Pt in CT.

## 2021-06-15 NOTE — Progress Notes (Signed)
Arrived to ED to place 2nd IV. Pt. In process of moving to ICU ?

## 2021-06-15 NOTE — Assessment & Plan Note (Addendum)
Admit to stepdown unit with continuous monitoring with telemetry pulse oximetry and neurochecks. ?Patient n.p.o., aspiration fall and seizure precautions. ?Attribute to overdose patient has Benadryl. ? Benadryl poisoning: ?Patient presenting with confusion anxiety delirium.  Vitals show tachycardia exam shows decreased bowel sounds patient is high risk of seizures patient develops urinary retention serum concentrations are not helpful patient to be admitted for close monitoring and supportive care with heart rate monitoring heart rhythm monitoring glucose n.p.o. pulse oximetry BLS services for managing airway breathing.  Seizure precautions.  Physostigmine may be needed and may be a consideration.  Antidotal physostigmine therapy and moderate to severe agitation or delirium.  Activated charcoal not given as patient is confused and may aspirate.  We will continue patient on sodium bicarb drip.  I will obtain a CPK and has a suspect patient may have underlying rhabdomyolysis. ?

## 2021-06-15 NOTE — H&P (Signed)
?History and Physical  ? ? ?Patient: Whitney Bernard [id number] DOB: [birth year] ?DOA: 06/15/2021 ?DOS: the patient was seen and examined on 06/15/2021 ?PCP: Lorelee Market, MD  ?Patient coming from: Home ? ?Chief Complaint:  ?Chief Complaint  ?Patient presents with  ? Weakness  ? ?HPI: Whitney Bernard is a 61 y.o. female with medical history significant of allergies to almond oil, atorvastatin, rosuvastatin, past medical history also of acute pancreatitis heart disease chronic kidney disease, COPD, diabetes mellitus type 2, hyperlipidemia, rheumatoid arthritis presenting with generalized weakness.  Patient is confused and disoriented and is not able to give HPI.  Per report patient has been taking excessive doses of Benadryl to make herself feel better and had difficulty ambulating today and came to the hospital.  Patient on initial presentation does meet sepsis criteria although source of infection is still not definite. ? ?Review of Systems  ?Unable to perform ROS: Mental status change  ? ?Past Medical History:  ?Diagnosis Date  ? Acute pancreatitis   ? CAD (coronary artery disease)   ? a. 08/2017 Neg MV; b. 01/2019 CTA Chest: Mild cor Ca2+; c. 02/2019 NSTEMI/PCI: LCX 95p/m (2.75x15 Resolute Onyx DES); d. 05/2019 Cath: LM nl, LAD nl, LCX patent stent, RCA 30ost/p-->Med rx.  ? Chronic pain syndrome   ? CKD (chronic kidney disease), stage III ([place])   ? COPD (chronic obstructive pulmonary disease) ([place])   ? Depression   ? Diabetes mellitus without complication ([place])   ? Headache(784.0)   ? migraines  ? History of echocardiogram   ? a. 04/2018 Echo: EF 55-60%. No significant valvular dzs; b. 02/2019 Echo: EF 60-65%. Nl RV fxn.  ? Hypercholesteremia   ? Hypertension   ? Hyperthyroidism   ? Hypokalemia   ? Migraine   ? Narcotic abuse ([place])   ? RA (rheumatoid arthritis) ([place])   ? ?Past Surgical History:  ?Procedure Laterality Date  ? ANTERIOR CERVICAL DECOMP/DISCECTOMY FUSION N/A 11/26/2012  ? Procedure: ANTERIOR CERVICAL  DECOMPRESSION/DISCECTOMY FUSION 2 LEVELS;  Surgeon: Faythe Ghee, MD;  Location: [facility];  Service: Neurosurgery;  Laterality: N/A;  ANTERIOR CERVICAL DECOMPRESSION/DISCECTOMY FUSION 2 LEVELS  ? CERVICAL POLYPECTOMY    ? CHOLECYSTECTOMY    ? CORONARY STENT INTERVENTION N/A 02/23/2019  ? Procedure: CORONARY STENT INTERVENTION;  Surgeon: Nelva Bush, MD;  Location: [facility];  Service: Cardiovascular;  Laterality: N/A;  ? ESOPHAGOGASTRODUODENOSCOPY N/A 12/26/2014  ? Procedure: ESOPHAGOGASTRODUODENOSCOPY (EGD);  Surgeon: Lucilla Lame, MD;  Location[facility] ENDOSCOPY;  Service: Endoscopy;  Laterality: N/A;  ? FRACTURE SURGERY    ? LEFT HEART CATH AND CORONARY ANGIOGRAPHY N/A 02/23/2019  ? Procedure: LEFT HEART CATH AND CORONARY ANGIOGRAPHY;  Surgeon: Minna Merritts, MD;  Location: [place] LAB;  Service: Cardiovascular;  Laterality: N/A;  ? LEFT HEART CATH AND CORONARY ANGIOGRAPHY N/A 06/06/2019  ? Procedure: LEFT HEART CATH AND CORONARY ANGIOGRAPHY;  Surgeon: Wellington Hampshire, MD;  Location: [place] [facility] LAB;  Service: Cardiovascular;  Laterality: N/A;  ? NECK SURGERY    ? ORIF WRIST FRACTURE Right 03/28/2020  ? Procedure: OPEN REDUCTION INTERNAL FIXATION OF RIGHT DISTAL RADIUS FRACTURE;  Surgeon: Corky Mull, MD;  Location: [facility] ORS;  Service: Orthopedics;  Laterality: Right;  ? TUBAL LIGATION    ? WISDOM TOOTH EXTRACTION    ? ?Social History:  reports that she quit smoking about 8 years ago. Her smoking use included cigarettes. She has a 19.00 pack-year smoking history. She has never used smokeless tobacco.  She reports that she does not currently use alcohol. She reports that she does not use drugs. ? ?Allergies  ?Allergen Reactions  ? Almond Oil Hives  ? Atorvastatin Other (See Comments)  ?  myalgias  ? Rosuvastatin Other (See Comments)  ?  Muscle pain  ? ? ?Family History  ?Problem Relation Age of Onset  ? CAD Father   ? Alzheimer's disease Other   ? Breast cancer Paternal Aunt   ?      another aunt- Polycythemia vera.   ? Breast cancer Cousin   ? Lung cancer Paternal Uncle   ? ? ?Prior to Admission medications   ?Medication Sig Start Date End Date Taking? Authorizing Provider  ?ALPRAZolam (XANAX) 0.5 MG tablet Take 0.5 mg by mouth daily. 03/16/20   [provider]  ?ALPRAZolam Duanne Moron) 0.5 MG tablet Take 1 tablet by mouth every day for mood disorder 11/08/20     ?ALPRAZolam (XANAX) 0.5 MG tablet take 1 tablet by oral route 2 times every day 06/04/21     ?ARIPiprazole (ABILIFY) 2 MG tablet take 0.5 tablet by oral route  every day for 2 weeks then increase to 1 po q day 06/03/21     ?aspirin EC 81 MG EC tablet Take 1 tablet (81 mg total) by mouth daily. 06/07/19   Jennye Boroughs, MD  ?atorvastatin (LIPITOR) 10 MG tablet take 1 tablet by mouth every day 07/05/20     ?atorvastatin (LIPITOR) 40 MG tablet take 1 tablet by oral route  every day 01/29/21     ?busPIRone (BUSPAR) 10 MG tablet Take 10 mg by mouth 2 (two) times daily.  04/26/18   [provider]  ?busPIRone (BUSPAR) 10 MG tablet take 1 tablet by oral route 3 times every day 11/08/20     ?butalbital-acetaminophen-caffeine (FIORICET) 50-325-40 MG tablet Take 1 tablet by mouth every 8 hours as needed 04/25/21     ?butalbital-acetaminophen-caffeine (FIORICET) 50-325-40 MG tablet Take 1 tablet by mouth every 8 hours as needed 11/09/20     ?Butalbital-APAP-Caffeine 50-325-40 MG capsule Take 1 tablet by mouth 3 (three) times daily. 04/26/18   [provider]  ?carvedilol (COREG) 3.125 MG tablet Take 1 tablet (3.125 mg total) by mouth 2 (two) times daily. ?Patient not taking: Reported on 03/27/2020 10/19/19   [name], NP  ?ezetimibe (ZETIA) 10 MG tablet Take 1 tablet (10 mg total) by mouth daily. 02/26/19   Damita Lack, MD  ?FLUoxetine (PROZAC) 20 MG capsule Take 1 capsule by mouth every day in the morning 11/08/20     ?FLUoxetine (PROZAC) 20 MG capsule take 1 capsule by mouth every day in the morning 01/29/21      ?furosemide (LASIX) 40 MG tablet Take 40 mg by mouth 2 (two) times daily.     [provider]  ?furosemide (LASIX) 40 MG tablet TAKE 1 TABLET BY MOUTH TWICE DAILY 03/13/20 03/13/21  Lorelee Market, MD  ?furosemide (LASIX) 40 MG tablet TAKE 1 TABLET BY MOUTH TWICE DAILY 05/11/21     ?ibuprofen (ADVIL) 200 MG tablet Take 600 mg by mouth every 4 (four) hours as needed for mild pain or moderate pain.    [provider]  ?lisinopril (ZESTRIL) 20 MG tablet take 1 tablet by oral route  every day 07/09/20     ?metFORMIN (GLUCOPHAGE) 500 MG tablet take 1 tablet by mouth every day with a meal 07/05/20     ?mirtazapine (REMERON) 30 MG tablet Take 30 mg by mouth at bedtime.  03/16/20   [provider]  ?mirtazapine (REMERON) 30 MG tablet TAKE 1 TABLET BY MOUTH EVERY DAY BEFORE BEDTIME AS NEEDED 12/23/19 12/22/20  Martin Majestic, FNP  ?Multiple Vitamins-Minerals (MULTIVITAMIN WITH MINERALS) tablet Take 1 tablet by mouth daily.    [provider]  ?omeprazole (PRILOSEC) 20 MG capsule Take 20 mg by mouth daily.    [provider]  ?PARoxetine (PAXIL) 40 MG tablet Take 40 mg by mouth daily.    [provider]  ?PARoxetine (PAXIL) 40 MG tablet TAKE 1 TABLET BY MOUTH EVERY DAY 11/26/19 11/25/20  Martin Majestic, FNP  ?PARoxetine (PAXIL) 40 MG tablet take 1 tablet by mouth every day 07/05/20     ?PARoxetine (PAXIL) 40 MG tablet take 1 tablet by mouth every day 08/23/20     ?potassium chloride SA (KLOR-CON M20) 20 MEQ tablet take 1 tablet by mouth 2 times a day with food 02/27/21     ?potassium chloride SA (KLOR-CON) 20 MEQ tablet Take 1 tablet (20 mEq total) by mouth 2 (two) times daily. 06/07/19   Jennye Boroughs, MD  ?potassium chloride SA (KLOR-CON) 20 MEQ tablet TAKE 1 TABLET BY MOUTH EVERY DAY WITH FOOD 12/23/19 12/22/20  Martin Majestic, FNP  ?potassium chloride SA (KLOR-CON) 20 MEQ tablet TAKE 1 TABLET BY MOUTH TWICE DAILY 10/11/19 10/10/20  Lorelee Market, MD   ?triamcinolone cream (KENALOG) 0.1 % Apply by topical route 2 times every day a thin layer to the affected area(s) 09/10/20     ?Blood work today shows sodium of 132 glucose 181 acute kidney injury on CKD with a creati

## 2021-06-15 NOTE — Assessment & Plan Note (Addendum)
Lab Results  ?Component Value Date  ? CREATININE 3.98 (H) 06/15/2021  ? CREATININE 4.24 (H) 06/15/2021  ? CREATININE 1.25 (H) 06/16/2020  ?pt coming in AKI on c/h kidney disease stage 3a.  ?Avoid contrast studies. ?Renally dose all needed meds.  ?

## 2021-06-15 NOTE — Assessment & Plan Note (Addendum)
Anticholinergic syndrome presents with CNS toxicity, anxiety, agitation, dysarthria, confusion, disorientation, psychosis delirium and paranoia.  Most commonly seen as tachycardia which is reliable to marker for anticholinergic toxicity and decreased absent R bowel sounds.  Patient bowel sounds are decreased. ?Monitor patient's pulse oximetry EKG neuro status. ?Overnight if patient's mental status does not improve or deteriorates or breathing deteriorates we will start patient on NIPPV continue to manage. ?Patient continued on her bicarb. ?

## 2021-06-15 NOTE — Assessment & Plan Note (Signed)
Blood pressure 112/64, pulse (!) 111, temperature 98 ?F (36.7 ?C), temperature source Oral, resp. rate 19, height 5\' 4"  (1.626 m), weight 65.8 kg, SpO2 98 %. ?bp is good.we will continue her home regimen of coreg. ? ? ? ?

## 2021-06-16 ENCOUNTER — Inpatient Hospital Stay: Payer: 59

## 2021-06-16 DIAGNOSIS — E1121 Type 2 diabetes mellitus with diabetic nephropathy: Secondary | ICD-10-CM

## 2021-06-16 DIAGNOSIS — R531 Weakness: Secondary | ICD-10-CM

## 2021-06-16 DIAGNOSIS — R338 Other retention of urine: Secondary | ICD-10-CM

## 2021-06-16 DIAGNOSIS — T443X1A Poisoning by other parasympatholytics [anticholinergics and antimuscarinics] and spasmolytics, accidental (unintentional), initial encounter: Secondary | ICD-10-CM | POA: Diagnosis not present

## 2021-06-16 DIAGNOSIS — N3000 Acute cystitis without hematuria: Secondary | ICD-10-CM

## 2021-06-16 DIAGNOSIS — N179 Acute kidney failure, unspecified: Secondary | ICD-10-CM

## 2021-06-16 DIAGNOSIS — N183 Chronic kidney disease, stage 3 unspecified: Secondary | ICD-10-CM

## 2021-06-16 DIAGNOSIS — J438 Other emphysema: Secondary | ICD-10-CM

## 2021-06-16 DIAGNOSIS — I10 Essential (primary) hypertension: Secondary | ICD-10-CM

## 2021-06-16 LAB — COMPREHENSIVE METABOLIC PANEL
ALT: 8 U/L (ref 0–44)
AST: 9 U/L — ABNORMAL LOW (ref 15–41)
Albumin: 3.1 g/dL — ABNORMAL LOW (ref 3.5–5.0)
Alkaline Phosphatase: 99 U/L (ref 38–126)
Anion gap: 12 (ref 5–15)
BUN: 85 mg/dL — ABNORMAL HIGH (ref 8–23)
CO2: 11 mmol/L — ABNORMAL LOW (ref 22–32)
Calcium: 6.5 mg/dL — ABNORMAL LOW (ref 8.9–10.3)
Chloride: 113 mmol/L — ABNORMAL HIGH (ref 98–111)
Creatinine, Ser: 3.69 mg/dL — ABNORMAL HIGH (ref 0.44–1.00)
GFR, Estimated: 13 mL/min — ABNORMAL LOW (ref >60)
Glucose, Bld: 163 mg/dL — ABNORMAL HIGH (ref 70–99)
Potassium: 4.3 mmol/L (ref 3.5–5.1)
Sodium: 136 mmol/L (ref 135–145)
Total Bilirubin: 0.6 mg/dL (ref 0.3–1.2)
Total Protein: 6.7 g/dL (ref 6.5–8.1)

## 2021-06-16 LAB — BASIC METABOLIC PANEL
Anion gap: 17 — ABNORMAL HIGH (ref 5–15)
Anion gap: 17 — ABNORMAL HIGH (ref 5–15)
Anion gap: 17 — ABNORMAL HIGH (ref 5–15)
BUN: 65 mg/dL — ABNORMAL HIGH (ref 8–23)
BUN: 57 mg/dL — ABNORMAL HIGH (ref 8–23)
BUN: 50 mg/dL — ABNORMAL HIGH (ref 8–23)
CO2: 18 mmol/L — ABNORMAL LOW (ref 22–32)
CO2: 20 mmol/L — ABNORMAL LOW (ref 22–32)
CO2: 24 mmol/L (ref 22–32)
Calcium: 6.5 mg/dL — ABNORMAL LOW (ref 8.9–10.3)
Calcium: 7 mg/dL — ABNORMAL LOW (ref 8.9–10.3)
Calcium: 7.2 mg/dL — ABNORMAL LOW (ref 8.9–10.3)
Chloride: 105 mmol/L (ref 98–111)
Chloride: 102 mmol/L (ref 98–111)
Chloride: 99 mmol/L (ref 98–111)
Creatinine, Ser: 2.54 mg/dL — ABNORMAL HIGH (ref 0.44–1.00)
Creatinine, Ser: 2.12 mg/dL — ABNORMAL HIGH (ref 0.44–1.00)
Creatinine, Ser: 1.86 mg/dL — ABNORMAL HIGH (ref 0.44–1.00)
GFR, Estimated: 21 mL/min — ABNORMAL LOW (ref >60)
GFR, Estimated: 26 mL/min — ABNORMAL LOW (ref >60)
GFR, Estimated: 30 mL/min — ABNORMAL LOW (ref >60)
Glucose, Bld: 107 mg/dL — ABNORMAL HIGH (ref 70–99)
Glucose, Bld: 109 mg/dL — ABNORMAL HIGH (ref 70–99)
Glucose, Bld: 130 mg/dL — ABNORMAL HIGH (ref 70–99)
Potassium: 3.5 mmol/L (ref 3.5–5.1)
Potassium: 3.4 mmol/L — ABNORMAL LOW (ref 3.5–5.1)
Potassium: 3.1 mmol/L — ABNORMAL LOW (ref 3.5–5.1)
Sodium: 140 mmol/L (ref 135–145)
Sodium: 139 mmol/L (ref 135–145)
Sodium: 140 mmol/L (ref 135–145)

## 2021-06-16 LAB — GLUCOSE, CAPILLARY
Glucose-Capillary: 102 mg/dL — ABNORMAL HIGH (ref 70–99)
Glucose-Capillary: 104 mg/dL — ABNORMAL HIGH (ref 70–99)
Glucose-Capillary: 105 mg/dL — ABNORMAL HIGH (ref 70–99)
Glucose-Capillary: 136 mg/dL — ABNORMAL HIGH (ref 70–99)
Glucose-Capillary: 152 mg/dL — ABNORMAL HIGH (ref 70–99)
Glucose-Capillary: 87 mg/dL (ref 70–99)

## 2021-06-16 LAB — BASIC METABOLIC PANEL WITH GFR
Anion gap: 16 — ABNORMAL HIGH (ref 5–15)
BUN: 76 mg/dL — ABNORMAL HIGH (ref 8–23)
CO2: 16 mmol/L — ABNORMAL LOW (ref 22–32)
Calcium: 6.4 mg/dL — CL (ref 8.9–10.3)
Chloride: 110 mmol/L (ref 98–111)
Creatinine, Ser: 2.93 mg/dL — ABNORMAL HIGH (ref 0.44–1.00)
GFR, Estimated: 17 mL/min — ABNORMAL LOW
Glucose, Bld: 101 mg/dL — ABNORMAL HIGH (ref 70–99)
Potassium: 2.8 mmol/L — ABNORMAL LOW (ref 3.5–5.1)
Sodium: 142 mmol/L (ref 135–145)

## 2021-06-16 LAB — COMPREHENSIVE METABOLIC PANEL WITH GFR
ALT: 8 U/L (ref 0–44)
AST: 8 U/L — ABNORMAL LOW (ref 15–41)
Albumin: 2.9 g/dL — ABNORMAL LOW (ref 3.5–5.0)
Alkaline Phosphatase: 93 U/L (ref 38–126)
Anion gap: 15 (ref 5–15)
BUN: 80 mg/dL — ABNORMAL HIGH (ref 8–23)
CO2: 16 mmol/L — ABNORMAL LOW (ref 22–32)
Calcium: 6.3 mg/dL — CL (ref 8.9–10.3)
Chloride: 110 mmol/L (ref 98–111)
Creatinine, Ser: 3.12 mg/dL — ABNORMAL HIGH (ref 0.44–1.00)
GFR, Estimated: 16 mL/min — ABNORMAL LOW
Glucose, Bld: 142 mg/dL — ABNORMAL HIGH (ref 70–99)
Potassium: 2.9 mmol/L — ABNORMAL LOW (ref 3.5–5.1)
Sodium: 141 mmol/L (ref 135–145)
Total Bilirubin: 0.9 mg/dL (ref 0.3–1.2)
Total Protein: 6.2 g/dL — ABNORMAL LOW (ref 6.5–8.1)

## 2021-06-16 LAB — BLOOD GAS, ARTERIAL
Acid-base deficit: 17.3 mmol/L — ABNORMAL HIGH (ref 0.0–2.0)
Acid-base deficit: 4 mmol/L — ABNORMAL HIGH (ref 0.0–2.0)
Bicarbonate: 8 mmol/L — ABNORMAL LOW (ref 20.0–28.0)
Bicarbonate: 19.6 mmol/L — ABNORMAL LOW (ref 20.0–28.0)
FIO2: 0.21 %
O2 Saturation: 100 %
O2 Saturation: 97.2 %
Patient temperature: 37
Patient temperature: 37
pCO2 arterial: 19 mmHg — CL (ref 32–48)
pCO2 arterial: 31 mmHg — ABNORMAL LOW (ref 32–48)
pH, Arterial: 7.23 — ABNORMAL LOW (ref 7.35–7.45)
pH, Arterial: 7.41 (ref 7.35–7.45)
pO2, Arterial: 153 mmHg — ABNORMAL HIGH (ref 83–108)
pO2, Arterial: 76 mmHg — ABNORMAL LOW (ref 83–108)

## 2021-06-16 LAB — URINE DRUG SCREEN, QUALITATIVE (ARMC ONLY)
Amphetamines, Ur Screen: NOT DETECTED
Barbiturates, Ur Screen: NOT DETECTED
Benzodiazepine, Ur Scrn: NOT DETECTED
Cannabinoid 50 Ng, Ur ~~LOC~~: NOT DETECTED
Cocaine Metabolite,Ur ~~LOC~~: NOT DETECTED
MDMA (Ecstasy)Ur Screen: NOT DETECTED
Methadone Scn, Ur: NOT DETECTED
Opiate, Ur Screen: NOT DETECTED
Phencyclidine (PCP) Ur S: NOT DETECTED
Tricyclic, Ur Screen: NOT DETECTED

## 2021-06-16 LAB — CBC
HCT: 33.5 % — ABNORMAL LOW (ref 36.0–46.0)
Hemoglobin: 10 g/dL — ABNORMAL LOW (ref 12.0–15.0)
MCH: 26.5 pg (ref 26.0–34.0)
MCHC: 29.9 g/dL — ABNORMAL LOW (ref 30.0–36.0)
MCV: 88.9 fL (ref 80.0–100.0)
Platelets: 396 10*3/uL (ref 150–400)
RBC: 3.77 MIL/uL — ABNORMAL LOW (ref 3.87–5.11)
RDW: 16.5 % — ABNORMAL HIGH (ref 11.5–15.5)
WBC: 21.2 10*3/uL — ABNORMAL HIGH (ref 4.0–10.5)
nRBC: 0 % (ref 0.0–0.2)

## 2021-06-16 LAB — MAGNESIUM: Magnesium: 1.7 mg/dL (ref 1.7–2.4)

## 2021-06-16 LAB — PHOSPHORUS: Phosphorus: 4 mg/dL (ref 2.5–4.6)

## 2021-06-16 LAB — HIV ANTIBODY (ROUTINE TESTING W REFLEX): HIV Screen 4th Generation wRfx: NONREACTIVE

## 2021-06-16 LAB — HEMOGLOBIN A1C
Hgb A1c MFr Bld: 6.9 % — ABNORMAL HIGH (ref 4.8–5.6)
Mean Plasma Glucose: 151.33 mg/dL

## 2021-06-16 LAB — HEPATIC FUNCTION PANEL
ALT: 8 U/L (ref 0–44)
AST: 10 U/L — ABNORMAL LOW (ref 15–41)
Albumin: 3.1 g/dL — ABNORMAL LOW (ref 3.5–5.0)
Alkaline Phosphatase: 103 U/L (ref 38–126)
Bilirubin, Direct: <0.1 mg/dL (ref 0.0–0.2)
Total Bilirubin: 0.7 mg/dL (ref 0.3–1.2)
Total Protein: 6.9 g/dL (ref 6.5–8.1)

## 2021-06-16 LAB — BETA-HYDROXYBUTYRIC ACID: Beta-Hydroxybutyric Acid: 4.13 mmol/L — ABNORMAL HIGH (ref 0.05–0.27)

## 2021-06-16 LAB — OSMOLALITY: Osmolality: 317 mOsm/kg — ABNORMAL HIGH (ref 275–295)

## 2021-06-16 LAB — ETHANOL: Alcohol, Ethyl (B): <10 mg/dL (ref <10)

## 2021-06-16 LAB — PROCALCITONIN: Procalcitonin: 0.92 ng/mL

## 2021-06-16 LAB — MRSA NEXT GEN BY PCR, NASAL: MRSA by PCR Next Gen: DETECTED — AB

## 2021-06-16 LAB — C-REACTIVE PROTEIN: CRP: 15.4 mg/dL — ABNORMAL HIGH (ref <1.0)

## 2021-06-16 LAB — CK: Total CK: 88 U/L (ref 38–234)

## 2021-06-16 LAB — T4, FREE: Free T4: 0.76 ng/dL (ref 0.61–1.12)

## 2021-06-16 LAB — SEDIMENTATION RATE: Sed Rate: 68 mm/hr — ABNORMAL HIGH (ref 0–30)

## 2021-06-16 LAB — OSMOLALITY, URINE: Osmolality, Ur: 336 mosm/kg (ref 300–900)

## 2021-06-16 LAB — TSH: TSH: 0.198 u[IU]/mL — ABNORMAL LOW (ref 0.350–4.500)

## 2021-06-16 MED ORDER — ALPRAZOLAM 0.5 MG PO TABS
0.5000 mg | ORAL_TABLET | Freq: Every evening | ORAL | Status: DC | PRN
  Administered 2021-06-17: 0.5 mg via ORAL
  Filled 2021-06-16: qty 1

## 2021-06-16 MED ORDER — BUSPIRONE HCL 5 MG PO TABS
10.0000 mg | ORAL_TABLET | Freq: Three times a day (TID) | ORAL | Status: DC
  Administered 2021-06-16 – 2021-06-19 (×9): 10 mg via ORAL
  Filled 2021-06-16: qty 2
  Filled 2021-06-16 (×3): qty 1
  Filled 2021-06-16 (×2): qty 2
  Filled 2021-06-16: qty 1
  Filled 2021-06-16: qty 2
  Filled 2021-06-16: qty 1
  Filled 2021-06-16 (×2): qty 2
  Filled 2021-06-16: qty 1

## 2021-06-16 MED ORDER — FLUOXETINE HCL 20 MG PO CAPS
20.0000 mg | ORAL_CAPSULE | Freq: Every day | ORAL | Status: DC
  Administered 2021-06-16 – 2021-06-19 (×4): 20 mg via ORAL
  Filled 2021-06-16 (×4): qty 1

## 2021-06-16 MED ORDER — ALPRAZOLAM 0.25 MG PO TABS: 0.2500 mg | ORAL_TABLET | Freq: Two times a day (BID) | ORAL | Status: DC | PRN

## 2021-06-16 MED ORDER — STERILE WATER FOR INJECTION IV SOLN
INTRAVENOUS | Status: DC
  Filled 2021-06-16: qty 1000
  Filled 2021-06-16 (×3): qty 150
  Filled 2021-06-16 (×5): qty 1000
  Filled 2021-06-16: qty 150
  Filled 2021-06-16: qty 1000

## 2021-06-16 MED ORDER — INSULIN ASPART 100 UNIT/ML IJ SOLN
0.0000 [IU] | INTRAMUSCULAR | Status: DC
  Administered 2021-06-16: 2 [IU] via SUBCUTANEOUS
  Administered 2021-06-16: 3 [IU] via SUBCUTANEOUS
  Administered 2021-06-17 – 2021-06-18 (×5): 2 [IU] via SUBCUTANEOUS
  Administered 2021-06-18: 3 [IU] via SUBCUTANEOUS
  Administered 2021-06-18 – 2021-06-19 (×4): 2 [IU] via SUBCUTANEOUS
  Filled 2021-06-16 (×12): qty 1

## 2021-06-16 MED ORDER — POTASSIUM CHLORIDE 10 MEQ/100ML IV SOLN
10.0000 meq | INTRAVENOUS | Status: AC
  Administered 2021-06-16 (×4): 10 meq via INTRAVENOUS
  Filled 2021-06-16 (×4): qty 100

## 2021-06-16 MED ORDER — ARIPIPRAZOLE 2 MG PO TABS
2.0000 mg | ORAL_TABLET | Freq: Every day | ORAL | Status: DC
  Administered 2021-06-16 – 2021-06-19 (×4): 2 mg via ORAL
  Filled 2021-06-16 (×4): qty 1

## 2021-06-16 MED ORDER — LACTATED RINGERS IV SOLN: INTRAVENOUS | Status: DC

## 2021-06-16 MED ORDER — CALCIUM GLUCONATE-NACL 1-0.675 GM/50ML-% IV SOLN
1.0000 g | Freq: Once | INTRAVENOUS | Status: AC
  Administered 2021-06-16: 1000 mg via INTRAVENOUS
  Filled 2021-06-16: qty 50

## 2021-06-16 MED ORDER — ATORVASTATIN CALCIUM 20 MG PO TABS
40.0000 mg | ORAL_TABLET | Freq: Every evening | ORAL | Status: DC
  Administered 2021-06-16 – 2021-06-18 (×3): 40 mg via ORAL
  Filled 2021-06-16 (×3): qty 2

## 2021-06-16 MED ORDER — POTASSIUM CHLORIDE CRYS ER 20 MEQ PO TBCR
40.0000 meq | EXTENDED_RELEASE_TABLET | Freq: Once | ORAL | Status: AC
  Administered 2021-06-16: 40 meq via ORAL
  Filled 2021-06-16: qty 2

## 2021-06-16 MED ORDER — HEPARIN SODIUM (PORCINE) 5000 UNIT/ML IJ SOLN
5000.0000 [IU] | Freq: Two times a day (BID) | INTRAMUSCULAR | Status: DC
  Administered 2021-06-16 – 2021-06-19 (×7): 5000 [IU] via SUBCUTANEOUS
  Filled 2021-06-16 (×7): qty 1

## 2021-06-16 MED ORDER — POTASSIUM CHLORIDE CRYS ER 20 MEQ PO TBCR
40.0000 meq | EXTENDED_RELEASE_TABLET | Freq: Two times a day (BID) | ORAL | Status: AC
  Administered 2021-06-16 (×2): 40 meq via ORAL
  Filled 2021-06-16 (×2): qty 2

## 2021-06-16 MED ORDER — CHLORHEXIDINE GLUCONATE CLOTH 2 % EX PADS
6.0000 | MEDICATED_PAD | Freq: Every day | CUTANEOUS | Status: DC
  Administered 2021-06-16 – 2021-06-18 (×3): 6 via TOPICAL

## 2021-06-16 MED ORDER — ASPIRIN EC 81 MG PO TBEC
81.0000 mg | DELAYED_RELEASE_TABLET | Freq: Every day | ORAL | Status: DC
  Administered 2021-06-16 – 2021-06-19 (×4): 81 mg via ORAL
  Filled 2021-06-16 (×4): qty 1

## 2021-06-16 MED ORDER — MAGNESIUM SULFATE 2 GM/50ML IV SOLN
2.0000 g | Freq: Once | INTRAVENOUS | Status: AC
  Administered 2021-06-16: 2 g via INTRAVENOUS
  Filled 2021-06-16: qty 50

## 2021-06-16 MED ORDER — CALCIUM GLUCONATE-NACL 2-0.675 GM/100ML-% IV SOLN: 2.0000 g | Freq: Once | INTRAVENOUS | Status: DC

## 2021-06-16 MED ORDER — SODIUM BICARBONATE 8.4 % IV SOLN
50.0000 meq | Freq: Once | INTRAVENOUS | Status: AC
  Administered 2021-06-16: 50 meq via INTRAVENOUS
  Filled 2021-06-16: qty 50

## 2021-06-16 MED ORDER — SODIUM BICARBONATE 8.4 % IV SOLN
100.0000 meq | Freq: Once | INTRAVENOUS | Status: AC
  Administered 2021-06-16: 100 meq via INTRAVENOUS
  Filled 2021-06-16: qty 100

## 2021-06-16 NOTE — Progress Notes (Signed)
?PROGRESS NOTE ? ?Whitney Bernard    DOB: [birth year], 61 y.o.  ?[id number]  Code Status: Full Code   ?DOA: 06/15/2021   LOS: 1  ? ?Brief hospital course  ?Whitney Bernard is a 61 y.o. 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 ? ?They presented from home to the ED on 06/15/2021 with generalized weakness and confusion x 1 days. Suspected to be due to unintentional diphenhydramine overdose. ? ?In the ED, it was found that they had stable vital signs with tachycardia up to 110s.   ?Significant findings included TSH 0.198, Na+ 132, K+ 5.1, Cr 4.24 from baseline around 1.2, Calcium 7.0, negative troponin, negative LA, WBC 27.4, hgb 11.4, platelets 482, urinalysis negative, negative salicylate and acetaminophen levels. Abdominal CT negative for acute disease. Chest xray negative for acute disease. ? ?They were treated with carvedilol, calcium, CTX, sodium bicarb drip, NS bolus 2L .  ?Patient was admitted to medicine service for further workup and management of AMS as outlined in detail below. ? ?06/16/21 -cautiously stable, improved ? ?Assessment & Plan  ?Principal Problem: ?  AMS (altered mental status) ?Active Problems: ?  Anticholinergic syndrome, accidental or unintentional, initial encounter ?  Generalized weakness ?  COPD (chronic obstructive pulmonary disease) ([place]) ?  Hypertension ?  CKD (chronic kidney disease), stage IIIa ?  Type II diabetes mellitus with renal manifestations ([place]) ?  CAD (coronary artery disease) ? ?AMS- somnolent on admission with acute delirium thought to be accidental overdose of diphenhydramine with anticholinergic symptoms. Poison control was contacted for recommendations. Patient seems to be improving today and is alert and oriented and able to answer questions appropriately.  ?- antiemetics  PRN ?- hBHB as per poison control recommendations ?- continuous telemetry ?- frequent neuro checks ?- repeat ABG ?- continue bicarb ? ?Hypocalcemia- corrects to 7.2 with hypoalbuminemia s/p 1 dose calcium gluconate ?- ECG ?- calcium gluconate repeat ?- CMP this evening ? ?Low TSH- TSH 0.198 and patient does not have history of hyperthyroidism or exogenous thyroid hormone use. Consider goiter as could contribute to current symptoms. Down about 2kg from about a year ago. ?- free T3 ?- thyroid US ? ?Acute on Chronic kidney disease stage III- Cr >4 on admission. Trending down to 2.54 ?- continue IV fluids and holding antihypertensives ? ?COPD- no acute exacerbation ?- home therapies ? ?HTN  CAD - no chest pain, negative troponins with non-ischemic ECG on admission. Well controlled BP ?- holding home antihypertensives in setting of AKI and normotensive ? ?Anxiety/depression- chronic, stable. Denies intentional OD or self harm thoughts ?- Risk of withdrawal outweighs the side effect profile so restarted home therapies ? ?Body mass index is 27.25 kg/m?. ? ?VTE ppx: heparin injection 5,000 Units Start: 06/16/21 0800 ? ? ?Diet:  ?   ?Diet  ? Diet NPO time specified  ? ?Consultants: ?None  ?Subjective 06/16/21   ? ?Pt reports feeling much improved. She states that at baseline she performs all ADLs and lives with her husband and sons. She does not have CP or SOB. Feels overall weak and doesn't think she could tolerate diet currently. ?  ?Objective  ? ?Vitals:  ? 06/16/21 0500 06/16/21 0700 06/16/21 0800 06/16/21 0900  ?BP: (!) 105/58 114/60 119/71 110/71  ?Pulse: (!) 104 (!) 102 (!) 105 (!) 105  ?Resp: 15 16 15 20   ?  Temp:    98.5 ?F (36.9 ?C)  ?TempSrc:    Oral  ?SpO2: 98% 97% 98% 98%  ?Weight:      ?Height:      ? ? ?Intake/Output Summary (Last 24 hours) at 06/16/2021 1223 ?Last data filed at 06/16/2021 1128 ?Gross per 24 hour  ?Intake 548.73 ml  ?Output 2450 ml  ?Net -1901.27 ml  ? ?Filed Weights  ? 06/15/21 1635 06/15/21  2354  ?Weight: 65.8 kg 72 kg  ?  ? ?Physical Exam:  ?General: awake, alert, NAD ?HEENT: atraumatic, clear conjunctiva, anicteric sclera, MMM, hearing grossly normal ?Respiratory: normal respiratory effort. ?Cardiovascular: quick capillary refill, normal S1/S2, RRR, no JVD, murmurs ?Gastrointestinal: soft, NT, ND ?Nervous: A&O x3. no gross focal neurologic deficits, normal speech ?Extremities: moves all equally, no edema, normal tone ?Skin: dry, intact, normal temperature, normal color. No rashes, lesions or ulcers on exposed skin ?Psychiatry: normal mood, congruent affect ? ?Labs   ?I have personally reviewed the following labs and imaging studies ?CBC ?   ?Component Value Date/Time  ? WBC 21.2 (H) 06/16/2021 0111  ? RBC 3.77 (L) 06/16/2021 0111  ? HGB 10.0 (L) 06/16/2021 0111  ? HGB 12.5 12/03/2013 1559  ? HCT 33.5 (L) 06/16/2021 0111  ? HCT 40.1 12/03/2013 1559  ? PLT 396 06/16/2021 0111  ? PLT 340 12/03/2013 1559  ? MCV 88.9 06/16/2021 0111  ? MCV 87 12/03/2013 1559  ? MCH 26.5 06/16/2021 0111  ? MCHC 29.9 (L) 06/16/2021 0111  ? RDW 16.5 (H) 06/16/2021 0111  ? RDW 16.7 (H) 12/03/2013 1559  ? LYMPHSABS 0.9 06/15/2021 1638  ? LYMPHSABS 3.1 10/09/2013 2338  ? MONOABS 1.5 (H) 06/15/2021 1638  ? MONOABS 1.1 (H) 10/09/2013 2338  ? EOSABS 0.0 06/15/2021 1638  ? EOSABS 0.2 10/09/2013 2338  ? BASOSABS 0.1 06/15/2021 1638  ? BASOSABS 0.1 10/09/2013 2338  ? ? ?  Latest Ref Rng & Units 06/16/2021  ? 10:30 AM 06/16/2021  ?  6:27 AM 06/16/2021  ?  4:46 AM  ?BMP  ?Glucose 70 - 99 mg/dL 107   101   142    ?BUN 8 - 23 mg/dL 65   76   80    ?Creatinine 0.44 - 1.00 mg/dL 2.54   2.93   3.12    ?Sodium 135 - 145 mmol/L 140   142   141    ?Potassium 3.5 - 5.1 mmol/L 3.5   2.8   2.9    ?Chloride 98 - 111 mmol/L 105   110   110    ?CO2 22 - 32 mmol/L 18   16   16     ?Calcium 8.9 - 10.3 mg/dL 6.5   6.4   6.3    ? ? ?CT ABDOMEN PELVIS WO CONTRAST ? ?Result Date: 06/15/2021 ?CLINICAL DATA:  Abdominal pain, weakness, fever EXAM: CT ABDOMEN AND  PELVIS WITHOUT CONTRAST TECHNIQUE: Multidetector CT imaging of the abdomen and pelvis was performed following the standard protocol without IV contrast. RADIATION DOSE REDUCTION: This exam was performed according to the departmental dose-optimization program which includes automated exposure control, adjustment of the mA and/or kV according to patient size and/or use of iterative reconstruction technique. COMPARISON:  10/13/2017 FINDINGS: Lower chest: Lung bases are essentially clear. Hepatobiliary: Coarse calcification along the posterior right hepatic lobe. Unenhanced liver is otherwise unremarkable. Status post cholecystectomy. No intrahepatic or extrahepatic ductal dilatation. Pancreas: Within normal limits. Spleen: Within normal limits. Adrenals/Urinary Tract: Adrenal glands are within normal limits. Subcentimeter  anterior interpolar left renal cyst (series 2/image 30). Right kidney is within normal limits. No renal calculi or hydronephrosis. Moderate bladder distension. Stomach/Bowel: Stomach is notable for a small hiatal hernia. No evidence of bowel obstruction. Normal appendix (series 2/image 54). Mild left colonic diverticulosis, without evidence of diverticulitis. Vascular/Lymphatic: No evidence of abdominal aortic aneurysm. Atherosclerotic calcifications of the abdominal aorta and branch vessels. No suspicious abdominopelvic lymphadenopathy. Reproductive: Uterus is within normal limits. Bilateral ovaries are within normal limits. Other: No abdominopelvic ascites. Musculoskeletal: Visualized osseous structures are within normal limits. IMPRESSION: No evidence of bowel obstruction. Normal appendix. Left colonic diverticulosis, without evidence of diverticulitis. Status post cholecystectomy. Small hiatal hernia. Electronically Signed   By: Julian Hy M.D.   On: 06/15/2021 19:04  ? ?DG Chest Portable 1 View ? ?Result Date: 06/15/2021 ?CLINICAL DATA:  Weakness. EXAM: PORTABLE CHEST 1 VIEW COMPARISON:   June 16, 2020 FINDINGS: The heart size and mediastinal contours are within normal limits. Decreased lung volumes are noted with mild areas of linear scarring and/or atelectasis seen within the lateral aspects

## 2021-06-16 NOTE — Progress Notes (Signed)
Blood pressure 101/70, pulse (!) 109, temperature 98.9 ?F (37.2 ?C), temperature source Oral, resp. rate 17, height 5\' 4"  (1.626 m), weight 72 kg, SpO2 100 %. ? ?  Latest Ref Rng & Units 06/16/2021  ?  1:11 AM 06/15/2021  ?  9:24 PM 06/15/2021  ?  4:38 PM  ?CMP  ?Glucose 70 - 99 mg/dL 163   176   181    ?BUN 8 - 23 mg/dL 85   90   93    ?Creatinine 0.44 - 1.00 mg/dL 3.69   3.98   4.24    ?Sodium 135 - 145 mmol/L 136   133   132    ?Potassium 3.5 - 5.1 mmol/L 4.3   4.9   5.1    ?Chloride 98 - 111 mmol/L 113   111   109    ?CO2 22 - 32 mmol/L 11   9   8     ?Calcium 8.9 - 10.3 mg/dL 6.5   6.3   7.0    ?Total Protein 6.5 - 8.1 g/dL 6.7    8.2    ?Total Bilirubin 0.3 - 1.2 mg/dL 0.6    0.7    ?Alkaline Phos 38 - 126 U/L 99    124    ?AST 15 - 41 U/L 9    10    ?ALT 0 - 44 U/L 8    9    ? ?At bedside with [name] who will follow up with pending labs and d/w poison control. ?D/w nurse justin about ABG and fluids and adding magnesium . ?Pt also given iv push of one amp of sodium bicarb.  ? ? ?No intake/output data recorded. ?Total I/O ?In: 548.7 [I.V.:504.6; IV Piggyback:44.2] ?Out: 1625 [Urine:1625] ? ? ?

## 2021-06-16 NOTE — Progress Notes (Signed)
Cross Cover ?Follow up requested for Ms Ozanich who has severe metabolic acidosis with frank bicarb deficit that is multifactorial in nature including overuse of benadryl to aid in control of pain from what I believe to ? be rheumatoid arthritis flair, and significant diarrhea for past 5 days.   ? ?Sepsis criteria ?She has significant leukocytosis with no clear infection source and normal lactic. Pro-calcitonin 0.92.  CRP and ESR pending ?Blood cultures pending, UA with rare bacteria and some leukocytosis, no nitrates, therefore not convincing of infection.   The acute urinary retention on admit more likely due to the benadryl ? ?She was given additional 2 amps sodium bicarb and continuous infusion changed to sterile water with 150 meq sodium bicarb at 200 ?She has also been started on moderate dose sliding scale insulin every 4 hours ?Home med rec not completed but she was able to tell me she does not take anything for her RA and she is followed by [facility]. Suggest high dose steroids for RA flare if CRP and ESR are extremely high ? ?Potassium this am 2.9, total 80 meq ordered for replacement ?Mag 1.7 - 2 gms ordered ?Ca 6.3, corrected for albumin is 9.3 ?Phosphorous pending ?Renal function - continues to slowly improve ? ?Previnar vaccine had been ordered - would delay that until white count has improved and acute infection completely ruled out ?

## 2021-06-17 DIAGNOSIS — R338 Other retention of urine: Secondary | ICD-10-CM | POA: Diagnosis not present

## 2021-06-17 DIAGNOSIS — I2510 Atherosclerotic heart disease of native coronary artery without angina pectoris: Secondary | ICD-10-CM

## 2021-06-17 DIAGNOSIS — T443X1A Poisoning by other parasympatholytics [anticholinergics and antimuscarinics] and spasmolytics, accidental (unintentional), initial encounter: Secondary | ICD-10-CM | POA: Diagnosis not present

## 2021-06-17 DIAGNOSIS — N3000 Acute cystitis without hematuria: Secondary | ICD-10-CM | POA: Diagnosis not present

## 2021-06-17 DIAGNOSIS — R400 Somnolence: Secondary | ICD-10-CM | POA: Diagnosis not present

## 2021-06-17 LAB — URINE CULTURE: Culture: NO GROWTH

## 2021-06-17 LAB — GLUCOSE, CAPILLARY
Glucose-Capillary: 149 mg/dL — ABNORMAL HIGH (ref 70–99)
Glucose-Capillary: 124 mg/dL — ABNORMAL HIGH (ref 70–99)
Glucose-Capillary: 111 mg/dL — ABNORMAL HIGH (ref 70–99)
Glucose-Capillary: 120 mg/dL — ABNORMAL HIGH (ref 70–99)
Glucose-Capillary: 137 mg/dL — ABNORMAL HIGH (ref 70–99)

## 2021-06-17 LAB — GASTROINTESTINAL PANEL BY PCR, STOOL (REPLACES STOOL CULTURE)

## 2021-06-17 LAB — BASIC METABOLIC PANEL
Anion gap: 19 — ABNORMAL HIGH (ref 5–15)
BUN: 43 mg/dL — ABNORMAL HIGH (ref 8–23)
CO2: 26 mmol/L (ref 22–32)
Calcium: 7.5 mg/dL — ABNORMAL LOW (ref 8.9–10.3)
Chloride: 96 mmol/L — ABNORMAL LOW (ref 98–111)
Creatinine, Ser: 1.66 mg/dL — ABNORMAL HIGH (ref 0.44–1.00)
GFR, Estimated: 34 mL/min — ABNORMAL LOW (ref >60)
Glucose, Bld: 121 mg/dL — ABNORMAL HIGH (ref 70–99)
Potassium: 3.3 mmol/L — ABNORMAL LOW (ref 3.5–5.1)
Sodium: 141 mmol/L (ref 135–145)

## 2021-06-17 LAB — BLOOD GAS, VENOUS
Acid-base deficit: 16.4 mmol/L — ABNORMAL HIGH (ref 0.0–2.0)
Bicarbonate: 10.3 mmol/L — ABNORMAL LOW (ref 20.0–28.0)
O2 Saturation: 71.4 %
Patient temperature: 37
pCO2, Ven: 27 mmHg — ABNORMAL LOW (ref 44–60)
pH, Ven: 7.19 — CL (ref 7.25–7.43)
pO2, Ven: 38 mmHg (ref 32–45)

## 2021-06-17 LAB — PROCALCITONIN: Procalcitonin: 0.19 ng/mL

## 2021-06-17 MED ORDER — POTASSIUM CHLORIDE CRYS ER 20 MEQ PO TBCR
40.0000 meq | EXTENDED_RELEASE_TABLET | Freq: Two times a day (BID) | ORAL | Status: DC
Start: 2021-06-17 — End: 2021-06-18
  Administered 2021-06-17 – 2021-06-18 (×3): 40 meq via ORAL
  Filled 2021-06-17 (×3): qty 2

## 2021-06-17 MED ORDER — MUPIROCIN 2 % EX OINT
TOPICAL_OINTMENT | Freq: Two times a day (BID) | CUTANEOUS | Status: DC
Start: 2021-06-17 — End: 2021-06-19
  Filled 2021-06-17: qty 22

## 2021-06-17 NOTE — Progress Notes (Signed)
?PROGRESS NOTE ? ?Whitney Bernard    DOB: 1960/04/16, 61 y.o.  ?[id number]  Code Status: Full Code   ?DOA: 06/15/2021   LOS: 2  ? ?Brief hospital course  ?Whitney Bernard is a 61 y.o. 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 ? ?They presented from home to the ED on 06/15/2021 with generalized weakness and confusion x 1 days. Suspected to be due to unintentional diphenhydramine overdose. ? ?In the ED, it was found that they had stable vital signs with tachycardia up to 110s.   ?Significant findings included TSH 0.198, Na+ 132, K+ 5.1, Cr 4.24 from baseline around 1.2, Calcium 7.0, negative troponin, negative LA, WBC 27.4, hgb 11.4, platelets 482, urinalysis negative, negative salicylate and acetaminophen levels. Abdominal CT negative for acute disease. Chest xray negative for acute disease. ? ?They were treated with carvedilol, calcium, CTX, sodium bicarb drip, NS bolus 2L .  ?Patient was admitted to medicine service for further workup and management of AMS as outlined in detail below. ? ?06/17/21 -cautiously stable, improved ? ?Assessment & Plan  ?Principal Problem: ?  AMS (altered mental status) ?Active Problems: ?  Anticholinergic syndrome, accidental or unintentional, initial encounter ?  Generalized weakness ?  COPD (chronic obstructive pulmonary disease) ([place]) ?  Hypertension ?  CKD (chronic kidney disease), stage IIIa ?  Type II diabetes mellitus with renal manifestations ([place]) ?  CAD (coronary artery disease) ?  Acute cystitis without hematuria ?  Acute urinary retention ?  AKI (acute kidney injury) ([place]) ? ?AMS- somnolent on admission with acute delirium thought to be accidental overdose of diphenhydramine with anticholinergic symptoms. Poison control was contacted for recommendations. Patient seems to be  improving today and is alert and oriented and able to answer questions appropriately. Patient had bowel incontinence this am. ?- antiemetics PRN ?- continuous telemetry ?- frequent neuro checks ?- discontinue bicarb ?- transfer to med-surg ? ?Hypocalcemia  hypokalemia- Ca++ 7.5 corrects to about 8.2 with hypoalbuminemia s/p 2 dose calcium gluconate. K+ 3.3 today ?- CMP am ?- replete PRN ? ?Low TSH- TSH 0.198 and patient does not have history of hyperthyroidism or exogenous thyroid hormone use. Consider goiter as could contribute to current symptoms. Down about 2kg from about a year ago. T4 normal, T3 pending. Thyroid US showed solitary nodule which did not meet criteria for biopsy. ?- free T3 ? ?Acute on Chronic kidney disease stage III- Cr >4 on admission. Trending down to 2.54>1.66 ?- continue IV fluids and holding antihypertensives ? ?COPD- no acute exacerbation ?- home therapies ? ?HTN  CAD - no chest pain, negative troponins with non-ischemic ECG on admission. Well controlled BP ?- holding home antihypertensives in setting of AKI and normotensive ? ?Anxiety/depression- chronic, stable. Denies intentional OD or self harm thoughts ?- Risk of withdrawal outweighs the side effect profile so restarted home therapies ? ?Body mass index is 28.27 kg/m?. ? ?VTE ppx: heparin injection 5,000 Units Start: 06/16/21 0800 ? ? ?Diet:  ?   ?Diet  ? Diet regular Room service appropriate? Yes; Fluid consistency: Thin  ? ?Consultants: ?None  ?Subjective 06/17/21   ? ?Pt reports feeling about the same as yesterday. She states that she does not feel like eating but does not know why. She denies nausea, abdominal discomfort. She feels very tired overall.  ?  ?Objective  ? ?Vitals:  ?  06/17/21 0800 06/17/21 0900 06/17/21 1000 06/17/21 1100  ?BP: (!) 157/80 (!) 144/123 (!) 142/76 130/65  ?Pulse: (!) 104 (!) 101 86 79  ?Resp: 17 18 16 15   ?Temp: 98 ?F (36.7 ?C)     ?TempSrc: Axillary     ?SpO2: 95% 94% 92% 94%  ?Weight:      ?Height:       ? ? ?Intake/Output Summary (Last 24 hours) at 06/17/2021 1317 ?Last data filed at 06/17/2021 1000 ?Gross per 24 hour  ?Intake 4249.52 ml  ?Output 1525 ml  ?Net 2724.52 ml  ? ? ?Filed Weights  ? 06/15/21 1635 06/15/21 2354 06/17/21 0427  ?Weight: 65.8 kg 72 kg 74.7 kg  ?  ? ?Physical Exam:  ?General: awake, alert, NAD ?HEENT: atraumatic, clear conjunctiva, anicteric sclera, MMM, hearing grossly normal ?Respiratory: normal respiratory effort. ?Cardiovascular: quick capillary refill, normal S1/S2, RRR, no JVD, murmurs ?Gastrointestinal: soft, NT, ND ?Nervous: A&O x3. no gross focal neurologic deficits, normal speech ?Extremities: moves all equally, no edema, normal tone ?Skin: dry, intact, normal temperature, normal color. No rashes, lesions or ulcers on exposed skin ?Psychiatry: flat mood, congruent affect ? ?Labs   ?I have personally reviewed the following labs and imaging studies ?CBC ?   ?Component Value Date/Time  ? WBC 21.2 (H) 06/16/2021 0111  ? RBC 3.77 (L) 06/16/2021 0111  ? HGB 10.0 (L) 06/16/2021 0111  ? HGB 12.5 12/03/2013 1559  ? HCT 33.5 (L) 06/16/2021 0111  ? HCT 40.1 12/03/2013 1559  ? PLT 396 06/16/2021 0111  ? PLT 340 12/03/2013 1559  ? MCV 88.9 06/16/2021 0111  ? MCV 87 12/03/2013 1559  ? MCH 26.5 06/16/2021 0111  ? MCHC 29.9 (L) 06/16/2021 0111  ? RDW 16.5 (H) 06/16/2021 0111  ? RDW 16.7 (H) 12/03/2013 1559  ? LYMPHSABS 0.9 06/15/2021 1638  ? LYMPHSABS 3.1 10/09/2013 2338  ? MONOABS 1.5 (H) 06/15/2021 1638  ? MONOABS 1.1 (H) 10/09/2013 2338  ? EOSABS 0.0 06/15/2021 1638  ? EOSABS 0.2 10/09/2013 2338  ? BASOSABS 0.1 06/15/2021 1638  ? BASOSABS 0.1 10/09/2013 2338  ? ? ?  Latest Ref Rng & Units 06/17/2021  ?  3:58 AM 06/16/2021  ? 10:24 PM 06/16/2021  ?  4:00 PM  ?BMP  ?Glucose 70 - 99 mg/dL 121   130   109    ?BUN 8 - 23 mg/dL 43   50   57    ?Creatinine 0.44 - 1.00 mg/dL 1.66   1.86   2.12    ?Sodium 135 - 145 mmol/L 141   140   139    ?Potassium 3.5 - 5.1 mmol/L 3.3   3.1   3.4    ?Chloride 98 - 111  mmol/L 96   99   102    ?CO2 22 - 32 mmol/L 26   24   20     ?Calcium 8.9 - 10.3 mg/dL 7.5   7.2   7.0    ? ? ?CT ABDOMEN PELVIS WO CONTRAST ? ?Result Date: 06/15/2021 ?CLINICAL DATA:  Abdominal pain, weakness, fever EXAM: CT ABDOMEN AND PELVIS WITHOUT CONTRAST TECHNIQUE: Multidetector CT imaging of the abdomen and pelvis was performed following the standard protocol without IV contrast. RADIATION DOSE REDUCTION: This exam was performed according to the departmental dose-optimization program which includes automated exposure control, adjustment of the mA and/or kV according to patient size and/or use of iterative reconstruction technique. COMPARISON:  10/13/2017 FINDINGS: Lower chest: Lung bases are essentially clear. Hepatobiliary: Coarse calcification  along the posterior right hepatic lobe. Unenhanced liver is otherwise unremarkable. Status post cholecystectomy. No intrahepatic or extrahepatic ductal dilatation. Pancreas: Within normal limits. Spleen: Within normal limits. Adrenals/Urinary Tract: Adrenal glands are within normal limits. Subcentimeter anterior interpolar left renal cyst (series 2/image 30). Right kidney is within normal limits. No renal calculi or hydronephrosis. Moderate bladder distension. Stomach/Bowel: Stomach is notable for a small hiatal hernia. No evidence of bowel obstruction. Normal appendix (series 2/image 54). Mild left colonic diverticulosis, without evidence of diverticulitis. Vascular/Lymphatic: No evidence of abdominal aortic aneurysm. Atherosclerotic calcifications of the abdominal aorta and branch vessels. No suspicious abdominopelvic lymphadenopathy. Reproductive: Uterus is within normal limits. Bilateral ovaries are within normal limits. Other: No abdominopelvic ascites. Musculoskeletal: Visualized osseous structures are within normal limits. IMPRESSION: No evidence of bowel obstruction. Normal appendix. Left colonic diverticulosis, without evidence of diverticulitis. Status post  cholecystectomy. Small hiatal hernia. Electronically Signed   By: Julian Hy M.D.   On: 06/15/2021 19:04  ? ?DG Chest Portable 1 View ? ?Result Date: 06/15/2021 ?CLINICAL DATA:  Weakness. EXAM: PORTABLE CHE

## 2021-06-18 DIAGNOSIS — I2510 Atherosclerotic heart disease of native coronary artery without angina pectoris: Secondary | ICD-10-CM | POA: Diagnosis not present

## 2021-06-18 DIAGNOSIS — N183 Chronic kidney disease, stage 3 unspecified: Secondary | ICD-10-CM | POA: Diagnosis not present

## 2021-06-18 DIAGNOSIS — E8809 Other disorders of plasma-protein metabolism, not elsewhere classified: Secondary | ICD-10-CM | POA: Diagnosis not present

## 2021-06-18 DIAGNOSIS — E1121 Type 2 diabetes mellitus with diabetic nephropathy: Secondary | ICD-10-CM | POA: Diagnosis not present

## 2021-06-18 DIAGNOSIS — G928 Other toxic encephalopathy: Secondary | ICD-10-CM | POA: Diagnosis not present

## 2021-06-18 DIAGNOSIS — I10 Essential (primary) hypertension: Secondary | ICD-10-CM | POA: Diagnosis not present

## 2021-06-18 DIAGNOSIS — T443X1A Poisoning by other parasympatholytics [anticholinergics and antimuscarinics] and spasmolytics, accidental (unintentional), initial encounter: Secondary | ICD-10-CM | POA: Diagnosis not present

## 2021-06-18 DIAGNOSIS — Z20822 Contact with and (suspected) exposure to covid-19: Secondary | ICD-10-CM | POA: Diagnosis not present

## 2021-06-18 DIAGNOSIS — J438 Other emphysema: Secondary | ICD-10-CM | POA: Diagnosis not present

## 2021-06-18 DIAGNOSIS — N19 Unspecified kidney failure: Secondary | ICD-10-CM

## 2021-06-18 DIAGNOSIS — I129 Hypertensive chronic kidney disease with stage 1 through stage 4 chronic kidney disease, or unspecified chronic kidney disease: Secondary | ICD-10-CM | POA: Diagnosis not present

## 2021-06-18 DIAGNOSIS — J449 Chronic obstructive pulmonary disease, unspecified: Secondary | ICD-10-CM | POA: Diagnosis not present

## 2021-06-18 DIAGNOSIS — N3000 Acute cystitis without hematuria: Secondary | ICD-10-CM | POA: Diagnosis not present

## 2021-06-18 DIAGNOSIS — E1122 Type 2 diabetes mellitus with diabetic chronic kidney disease: Secondary | ICD-10-CM | POA: Diagnosis not present

## 2021-06-18 DIAGNOSIS — E872 Acidosis, unspecified: Secondary | ICD-10-CM | POA: Diagnosis not present

## 2021-06-18 DIAGNOSIS — N179 Acute kidney failure, unspecified: Secondary | ICD-10-CM | POA: Diagnosis not present

## 2021-06-18 DIAGNOSIS — R531 Weakness: Secondary | ICD-10-CM | POA: Diagnosis not present

## 2021-06-18 DIAGNOSIS — T450X1A Poisoning by antiallergic and antiemetic drugs, accidental (unintentional), initial encounter: Secondary | ICD-10-CM | POA: Diagnosis not present

## 2021-06-18 LAB — COMPREHENSIVE METABOLIC PANEL
ALT: 8 U/L (ref 0–44)
AST: 11 U/L — ABNORMAL LOW (ref 15–41)
Albumin: 2.7 g/dL — ABNORMAL LOW (ref 3.5–5.0)
Alkaline Phosphatase: 83 U/L (ref 38–126)
Anion gap: 19 — ABNORMAL HIGH (ref 5–15)
BUN: 27 mg/dL — ABNORMAL HIGH (ref 8–23)
CO2: 30 mmol/L (ref 22–32)
Calcium: 8.4 mg/dL — ABNORMAL LOW (ref 8.9–10.3)
Chloride: 92 mmol/L — ABNORMAL LOW (ref 98–111)
Creatinine, Ser: 1.25 mg/dL — ABNORMAL HIGH (ref 0.44–1.00)
GFR, Estimated: 48 mL/min — ABNORMAL LOW (ref >60)
Glucose, Bld: 116 mg/dL — ABNORMAL HIGH (ref 70–99)
Potassium: 3 mmol/L — ABNORMAL LOW (ref 3.5–5.1)
Sodium: 141 mmol/L (ref 135–145)
Total Bilirubin: 1.4 mg/dL — ABNORMAL HIGH (ref 0.3–1.2)
Total Protein: 6.1 g/dL — ABNORMAL LOW (ref 6.5–8.1)

## 2021-06-18 LAB — GLUCOSE, CAPILLARY
Glucose-Capillary: 117 mg/dL — ABNORMAL HIGH (ref 70–99)
Glucose-Capillary: 122 mg/dL — ABNORMAL HIGH (ref 70–99)
Glucose-Capillary: 129 mg/dL — ABNORMAL HIGH (ref 70–99)
Glucose-Capillary: 136 mg/dL — ABNORMAL HIGH (ref 70–99)
Glucose-Capillary: 149 mg/dL — ABNORMAL HIGH (ref 70–99)
Glucose-Capillary: 179 mg/dL — ABNORMAL HIGH (ref 70–99)

## 2021-06-18 LAB — T3, FREE: T3, Free: 1.6 pg/mL — ABNORMAL LOW (ref 2.0–4.4)

## 2021-06-18 MED ORDER — POTASSIUM CHLORIDE CRYS ER 20 MEQ PO TBCR: 40.0000 meq | EXTENDED_RELEASE_TABLET | Freq: Three times a day (TID) | ORAL | Status: DC

## 2021-06-18 MED ORDER — LISINOPRIL 20 MG PO TABS
20.0000 mg | ORAL_TABLET | Freq: Every day | ORAL | Status: DC
  Administered 2021-06-18 – 2021-06-19 (×2): 20 mg via ORAL
  Filled 2021-06-18 (×2): qty 1

## 2021-06-18 MED ORDER — POTASSIUM CHLORIDE CRYS ER 20 MEQ PO TBCR
40.0000 meq | EXTENDED_RELEASE_TABLET | Freq: Three times a day (TID) | ORAL | Status: DC
  Administered 2021-06-18 – 2021-06-19 (×3): 40 meq via ORAL
  Filled 2021-06-18 (×3): qty 2

## 2021-06-18 MED ORDER — CARVEDILOL 6.25 MG PO TABS
6.2500 mg | ORAL_TABLET | Freq: Two times a day (BID) | ORAL | Status: DC
  Administered 2021-06-18 – 2021-06-19 (×3): 6.25 mg via ORAL
  Filled 2021-06-18 (×3): qty 1

## 2021-06-18 MED ORDER — SPIRONOLACTONE 25 MG PO TABS
12.5000 mg | ORAL_TABLET | Freq: Every day | ORAL | Status: DC
  Administered 2021-06-18 – 2021-06-19 (×2): 12.5 mg via ORAL
  Filled 2021-06-18 (×2): qty 0.5

## 2021-06-18 NOTE — Evaluation (Signed)
Occupational Therapy Evaluation ?Patient Details ?Name: Whitney Bernard ?MRN: [id number] ?DOB: 1960/12/06 ?Today's Date: 06/18/2021 ? ? ?History of Present Illness Pt is a 61 yo female that presented to the ED for generalized weakness and confusion, suspect due to unintentional diphenhydramine overdose. 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  ? ?Clinical Impression ?  ?Pt was seen for OT evaluation this date. Prior to hospital admission, pt was independent. Pt lives with her spouse and 2 adult sons. Currently pt demonstrates impairments in strength, activity tolerance, balance, and cognition (delayed problem solving, delayed recall, decreased safety) as described below (See OT problem list) which functionally limit her ability to perform ADL/self-care tasks. Pt able to don/doff socks seated EOB using figure 4 technique wiht mod indep, CGA for LB ADL involving ADL transfers, stands at sink for grooming tasks requiring UE support on countertops and CGA. Decreased tolerance for activity. Pt would benefit from skilled OT services to address noted impairments and functional limitations (see below for any additional details) in order to maximize safety and independence while minimizing falls risk and caregiver burden. Upon hospital discharge, recommend HHOT to maximize pt safety and return to functional independence during meaningful occupations of daily life.   ? ?Recommendations for follow up therapy are one component of a multi-disciplinary discharge planning process, led by the attending physician.  Recommendations may be updated based on patient status, additional functional criteria and insurance authorization.  ? ?Follow Up Recommendations ? Home health OT  ?  ?Assistance Recommended at Discharge Frequent or constant Supervision/Assistance  ?Patient can  return home with the following A little help with walking and/or transfers;A little help with bathing/dressing/bathroom;Assistance with cooking/housework;Assist for transportation;Help with stairs or ramp for entrance;Direct supervision/assist for medications management ? ?  ?Functional Status Assessment ? Patient has had a recent decline in their functional status and demonstrates the ability to make significant improvements in function in a reasonable and predictable amount of time.  ?Equipment Recommendations ? None recommended by OT  ?  ?Recommendations for Other Services   ? ? ?  ?Precautions / Restrictions Precautions ?Precautions: Fall ?Restrictions ?Weight Bearing Restrictions: No  ? ?  ? ?Mobility Bed Mobility ?Overal bed mobility: Modified Independent ?  ?  ?  ?  ?  ?  ?General bed mobility comments: increased time/effort ?  ? ?Transfers ?Overall transfer level: Needs assistance ?Equipment used: None ?Transfers: Sit to/from Stand ?Sit to Stand: Min guard ?  ?  ?  ?  ?  ?  ?  ? ?  ?Balance Overall balance assessment: Needs assistance ?Sitting-balance support: Feet supported ?Sitting balance-Leahy Scale: Good ?  ?  ?Standing balance support: Bilateral upper extremity supported, Single extremity supported ?Standing balance-Leahy Scale: Fair ?Standing balance comment: tendency to prop forearms on countertops for stability while completing grooming tasks at the sink ?  ?  ?  ?  ?  ?  ?  ?  ?  ?  ?  ?   ? ?ADL either performed or assessed with clinical judgement  ? ?ADL Overall ADL's : Needs assistance/impaired ?  ?  ?  ?  ?  ?  ?  ?  ?  ?  ?  ?  ?  ?  ?  ?  ?  ?  ?  ?General ADL Comments: Pt able to don/doff socks seated EOB using figure 4 technique wiht  mod indep, CGA for LB ADL involving ADL transfers, stands at sink for grooming tasks requiring UE support on countertops and CGA. Decreased tolerance for activity.  ? ? ? ?Vision   ?   ?   ?Perception   ?  ?Praxis   ?  ? ?Pertinent Vitals/Pain Pain  Assessment ?Pain Assessment: No/denies pain  ? ? ? ?Hand Dominance   ?  ?Extremity/Trunk Assessment Upper Extremity Assessment ?Upper Extremity Assessment: Generalized weakness ?  ?Lower Extremity Assessment ?Lower Extremity Assessment: Generalized weakness ?  ?Cervical / Trunk Assessment ?Cervical / Trunk Assessment: Normal ?  ?Communication Communication ?Communication: No difficulties ?  ?Cognition Arousal/Alertness: Awake/alert ?Behavior During Therapy: Flat affect ?Overall Cognitive Status: No family/caregiver present to determine baseline cognitive functioning ?  ?  ?  ?  ?  ?  ?  ?  ?  ?  ?  ?  ?  ?  ?  ?  ?General Comments: Pt alert, oriented x4, demonstrates slow processing, problem solving, and delayed recall ?  ?  ?General Comments    ? ?  ?Exercises   ?  ?Shoulder Instructions    ? ? ?Home Living Family/patient expects to be discharged to:: Private residence ?Living Arrangements: Spouse/significant other;Children ?Available Help at Discharge: Family ?Type of Home: House ?Home Access: Stairs to enter ?Entrance Stairs-Number of Steps: 2 ?  ?Home Layout: One level ?  ?  ?Bathroom Shower/Tub: Tub/shower unit ?  ?Bathroom Toilet: Standard ?  ?  ?Home Equipment: None ?  ?  ?  ? ?  ?Prior Functioning/Environment Prior Level of Function : Independent/Modified Independent ?  ?  ?  ?  ?  ?  ?  ?  ?  ? ?  ?  ?OT Problem List: Decreased strength;Decreased cognition;Decreased activity tolerance;Decreased safety awareness;Impaired balance (sitting and/or standing);Decreased knowledge of use of DME or AE ?  ?   ?OT Treatment/Interventions: Self-care/ADL training;Therapeutic exercise;Therapeutic activities;DME and/or AE instruction;Patient/family education;Balance training;Energy conservation;Cognitive remediation/compensation  ?  ?OT Goals(Current goals can be found in the care plan section) Acute Rehab OT Goals ?Patient Stated Goal: feel better and go home ?OT Goal Formulation: With patient ?Time For Goal Achievement:  07/02/21 ?Potential to Achieve Goals: Good ?ADL Goals ?Pt Will Transfer to Toilet: with modified independence;ambulating (LRAD) ?Pt Will Perform Toileting - Clothing Manipulation and hygiene: with modified independence ?Additional ADL Goal #1: Pt will complete all aspects of bathing, primarily from seated position for safety, with remote supervision. AE PRN.  ?OT Frequency: Min 2X/week ?  ? ?Co-evaluation   ?  ?  ?  ?  ? ?  ?AM-PAC OT "6 Clicks" Daily Activity     ?Outcome Measure Help from another person eating meals?: None ?Help from another person taking care of personal grooming?: A Little ?Help from another person toileting, which includes using toliet, bedpan, or urinal?: A Little ?Help from another person bathing (including washing, rinsing, drying)?: A Little ?Help from another person to put on and taking off regular upper body clothing?: None ?Help from another person to put on and taking off regular lower body clothing?: A Little ?6 Click Score: 20 ?  ?End of Session   ? ?Activity Tolerance: Patient tolerated treatment well ?Patient left: in bed;with call bell/phone within reach;with bed alarm set ? ?OT Visit Diagnosis: Other abnormalities of gait and mobility (R26.89);Muscle weakness (generalized) (M62.81)  ?              ?Time: [id number] ?OT Time Calculation (min): 11 min ?Charges:  OT General Charges ?$OT Visit: 1 Visit ?OT Evaluation ?$OT Eval Moderate Complexity: 1 Mod ? ?Ardeth Perfect., MPH, MS, OTR/L ?ascom [phone] ?06/18/21, 3:18 PM ?

## 2021-06-18 NOTE — Progress Notes (Signed)
?PROGRESS NOTE ? ?Whitney Bernard    DOB: [birth year], 61 y.o.  ?[id number]  Code Status: Full Code   ?DOA: 06/15/2021   LOS: 3  ? ?Brief hospital course  ?Whitney Bernard is a 61 y.o. 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 ? ?They presented from home to the ED on 06/15/2021 with generalized weakness and confusion x 1 days. Suspected to be due to unintentional diphenhydramine overdose. ? ?In the ED, it was found that they had stable vital signs with tachycardia up to 110s.   ?Significant findings included TSH 0.198, Na+ 132, K+ 5.1, Cr 4.24 from baseline around 1.2, Calcium 7.0, negative troponin, negative LA, WBC 27.4, hgb 11.4, platelets 482, urinalysis negative, negative salicylate and acetaminophen levels. Abdominal CT negative for acute disease. Chest xray negative for acute disease. ? ?They were treated with carvedilol, calcium, CTX, sodium bicarb drip, NS bolus 2L. ? ?Patient was admitted to medicine service for further workup and management of AMS as outlined in detail below. ? ?06/18/21 -cautiously stable, improved ? ?Assessment & Plan  ?Principal Problem: ?  AMS (altered mental status) ?Active Problems: ?  Anticholinergic syndrome, accidental or unintentional, initial encounter ?  Generalized weakness ?  COPD (chronic obstructive pulmonary disease) ([place]) ?  Hypertension ?  CKD (chronic kidney disease), stage IIIa ?  Type II diabetes mellitus with renal manifestations ([place]) ?  CAD (coronary artery disease) ?  Acute cystitis without hematuria ?  Acute urinary retention ?  AKI (acute kidney injury) ([place]) ? ?Metabolic encephalopathy  AMS- somnolent on admission with acute delirium thought to be accidental overdose of diphenhydramine with anticholinergic symptoms. Poison control was contacted for recommendations.  Patient seems to be improving today and is alert and oriented and able to answer questions appropriately. Consider depression as contributing to her symptoms as patient exhibits signs and not resolution with correction of her underlying metabolic derangements.  ?- antiemetics PRN ?- frequent neuro checks ?- PT/OT ? ?Hypocalcemia  hypokalemia- Ca++ 8.4 and corrects to normal. s/p 2 dose calcium gluconate.  ?K+ 3.3>3.0 today despite repletion. Has not been tolerating diet well ?- CMP am ?- replete PRN ?- added on spironolactone  ? ?Low TSH- TSH 0.198 and patient does not have history of hyperthyroidism or exogenous thyroid hormone use. Consider goiter as could contribute to current symptoms. Down about 2kg from about a year ago. T4 normal, T3 pending. Thyroid US showed solitary nodule which did not meet criteria for biopsy. ?- free T3 ? ?Acute on Chronic kidney disease stage III- Cr >4 on admission. Trending down to 2.54>1.66>1.23 which is about her baseline or better. ?- encourage PO hydration ? ?COPD- no acute exacerbation ?- home therapies ? ?HTN  CAD - no chest pain, negative troponins with non-ischemic ECG on admission. hypertensive ?- restarted antihypertensives with resolution of AKI and now hypertensive ?- added on spironolactone for better BP control and to benefit her continual hypokalemia ? ?Anxiety/depression- chronic, stable. Denies intentional OD or self harm thoughts. Potentially worsened. ?- Risk of withdrawal outweighs the side effect profile so restarted home therapies ? ?Body mass index is 28.57 kg/m?. ? ?VTE ppx: heparin injection 5,000 Units Start: 06/16/21 0800 ? ? ?Diet:  ?   ?Diet  ? Diet regular Room service appropriate? Yes; Fluid consistency: Thin  ? ?Consultants: ?None  ?Subjective 06/18/21   ? ?  Pt reports feeling about the same as yesterday. She states that she does not feel like eating but does not know why. She denies nausea, abdominal discomfort. She feels very tired overall.  ?   ?Objective  ? ?Vitals:  ? 06/17/21 1933 06/18/21 0000 06/18/21 0449 06/18/21 0731  ?BP: (!) 168/90 (!) 162/90 (!) 156/82 (!) 177/91  ?Pulse: 96 (!) 103 97 90  ?Resp: 18 18 16 17   ?Temp: 98.1 ?F (36.7 ?C)  98.7 ?F (37.1 ?C) 98.7 ?F (37.1 ?C)  ?TempSrc:      ?SpO2: 94% 91% 94% 94%  ?Weight:   75.5 kg   ?Height:      ? ? ?Intake/Output Summary (Last 24 hours) at 06/18/2021 0738 ?Last data filed at 06/18/2021 0018 ?Gross per 24 hour  ?Intake 4249.52 ml  ?Output 950 ml  ?Net 3299.52 ml  ? ? ?Filed Weights  ? 06/15/21 2354 06/17/21 0427 06/18/21 0449  ?Weight: 72 kg 74.7 kg 75.5 kg  ?  ? ?Physical Exam:  ?General: awake, alert, NAD ?HEENT: atraumatic, clear conjunctiva, anicteric sclera, MMM, hearing grossly normal ?Respiratory: normal respiratory effort. ?Cardiovascular: quick capillary refill, normal S1/S2, RRR, no JVD, murmurs ?Gastrointestinal: soft, NT, ND ?Nervous: A&O x3. no gross focal neurologic deficits, normal speech ?Extremities: moves all equally, no edema, normal tone ?Skin: dry, intact, normal temperature, normal color. No rashes, lesions or ulcers on exposed skin ?Psychiatry: flat mood, congruent affect ? ?Labs   ?I have personally reviewed the following labs and imaging studies ?CBC ?   ?Component Value Date/Time  ? WBC 21.2 (H) 06/16/2021 0111  ? RBC 3.77 (L) 06/16/2021 0111  ? HGB 10.0 (L) 06/16/2021 0111  ? HGB 12.5 12/03/2013 1559  ? HCT 33.5 (L) 06/16/2021 0111  ? HCT 40.1 12/03/2013 1559  ? PLT 396 06/16/2021 0111  ? PLT 340 12/03/2013 1559  ? MCV 88.9 06/16/2021 0111  ? MCV 87 12/03/2013 1559  ? MCH 26.5 06/16/2021 0111  ? MCHC 29.9 (L) 06/16/2021 0111  ? RDW 16.5 (H) 06/16/2021 0111  ? RDW 16.7 (H) 12/03/2013 1559  ? LYMPHSABS 0.9 06/15/2021 1638  ? LYMPHSABS 3.1 10/09/2013 2338  ? MONOABS 1.5 (H) 06/15/2021 1638  ? MONOABS 1.1 (H) 10/09/2013 2338  ? EOSABS 0.0 06/15/2021 1638  ? EOSABS 0.2 10/09/2013 2338  ? BASOSABS 0.1 06/15/2021 1638  ? BASOSABS 0.1 10/09/2013 2338  ? ? ?  Latest Ref Rng & Units  06/18/2021  ?  4:58 AM 06/17/2021  ?  3:58 AM 06/16/2021  ? 10:24 PM  ?BMP  ?Glucose 70 - 99 mg/dL 116   121   130    ?BUN 8 - 23 mg/dL 27   43   50    ?Creatinine 0.44 - 1.00 mg/dL 1.25   1.66   1.86    ?Sodium 135 - 145 mmol/L 141   141   140    ?Potassium 3.5 - 5.1 mmol/L 3.0   3.3   3.1    ?Chloride 98 - 111 mmol/L 92   96   99    ?CO2 22 - 32 mmol/L 30   26   24     ?Calcium 8.9 - 10.3 mg/dL 8.4   7.5   7.2    ? ? ?US THYROID ? ?Result Date: 06/17/2021 ?CLINICAL DATA:  Hyperthyroid. EXAM: THYROID ULTRASOUND TECHNIQUE: Ultrasound examination of the thyroid gland and adjacent soft tissues was performed. COMPARISON:  None. FINDINGS: Parenchymal Echotexture: Mildly heterogeneous Isthmus: 0.3 cm Right lobe: 4.6 x  1.8 x 1.6 cm Left lobe: 3.8 x 1.5 x 1.9 cm ________________________________________________________ Estimated total number of nodules >/= 1 cm: 0 Number of spongiform nodules >/=  2 cm not described below (TR1): 0 Number of mixed cystic and solid nodules >/= 1.5 cm not described below ([place]): 0 _________________________________________________________ Nodule # 1: Location: Left; Inferior Maximum size: 0.9 cm; Other 2 dimensions: 0.8 x 0.7 cm Composition: mixed cystic and solid (1) Echogenicity: isoechoic (1) Shape: not taller-than-wide (0) Margins: smooth (0) Echogenic foci: none (0) ACR TI-RADS total points: 2. ACR TI-RADS risk category: TR2 (2 points). ACR TI-RADS recommendations: This nodule does NOT meet TI-RADS criteria for biopsy or dedicated follow-up. _________________________________________________________ No cervical lymphadenopathy. IMPRESSION: Solitary mixed solid cystic nodule in the left inferior thyroid (labeled 1, 0.9 cm) which does not meet criteria (TI-RADS category 2) for dedicated ultrasound follow-up or tissue sampling. The above is in keeping with the ACR TI-RADS recommendations - J Am Coll Radiol 2017;14:587-595. Ruthann Cancer, MD [facility]  Radiology Electronically Signed   By: Ruthann Cancer M.D.   On: 06/17/2021 08:08   ? ?Disposition Plan & Communication  ?Patient status: Inpatient  ?Admitted From: Home ?Planned disposition location: Home ?[place]tic[place]

## 2021-06-18 NOTE — Progress Notes (Signed)
06/18/2021 at 0310: ? ?[facility] Poison Control updated by this RN on pt status. Pt is resting and call bell within reach.  ?

## 2021-06-18 NOTE — Evaluation (Signed)
Physical Therapy Evaluation ?Patient Details ?Name: Whitney Bernard ?MRN: [id number] ?DOB: 01-29-1961 ?Today's Date: 06/18/2021 ? ?History of Present Illness ? Pt is a 61 yo female that presented to the ED for generalized weakness and confusion, suspect due to unintentional diphenhydramine overdose. 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 ?  ?Clinical Impression ? The pt was alert, in bed, oriented x3 (disoriented to situation) and did display some decreased problem solving awareness, safety awareness and somewhat self limiting during session. Per pt report she is independent at baseline and lives with her husband and adult sons.  ? ?The patient was able to perform bed mobility modI. She was able to sit for several minutes, good sitting balance noted. Sit <> stand with RW and CGA cued for hand placement, but pt spontaneously returns to sitting, the first time reported some dizziness. Step pivot to recliner with RW and CGA no unsteadiness noted. With encouragement she ambulated ~75ft, but again self limits distance and returns to recliner. Reported that she just did not feel like herself.  Overall the patient demonstrated deficits (see "PT Problem List") that impede the patient's functional abilities, safety, and mobility and would benefit from skilled PT intervention. Recommendation at this time is HHPT with frequent/constant supervision/assistance to maximize function and safety.    ?   ? ?Recommendations for follow up therapy are one component of a multi-disciplinary discharge planning process, led by the attending physician.  Recommendations may be updated based on patient status, additional functional criteria and insurance authorization. ? ?Follow Up Recommendations Home health PT ? ?  ?Assistance Recommended at Discharge Frequent or constant  Supervision/Assistance  ?Patient can return home with the following ? A little help with walking and/or transfers;A little help with bathing/dressing/bathroom;Assistance with cooking/housework;Assist for transportation;Help with stairs or ramp for entrance;Direct supervision/assist for medications management ? ?  ?Equipment Recommendations Rolling walker (2 wheels)  ?Recommendations for Other Services ?    ?  ?Functional Status Assessment Patient has had a recent decline in their functional status and demonstrates the ability to make significant improvements in function in a reasonable and predictable amount of time.  ? ?  ?Precautions / Restrictions Precautions ?Precautions: Fall ?Restrictions ?Weight Bearing Restrictions: No  ? ?  ? ?Mobility ? Bed Mobility ?Overal bed mobility: Modified Independent ?  ?  ?  ?  ?  ?  ?  ?  ? ?Transfers ?Overall transfer level: Needs assistance ?Equipment used: Rolling walker (2 wheels) ?Transfers: Sit to/from Stand, Bed to chair/wheelchair/BSC ?Sit to Stand: Min guard ?  ?Step pivot transfers: Supervision ?  ?  ?  ?General transfer comment: cued for hand placement each time, no true physical assistance. pt did return to sitting spontaneously on first attempt ?  ? ?Ambulation/Gait ?Ambulation/Gait assistance: Min guard ?Gait Distance (Feet): 5 Feet ?Assistive device: Rolling walker (2 wheels) ?  ?  ?  ?  ?General Gait Details: pt declined going any further, and did not provide an answer as to why despite being asked. just reported not feeling like herself ? ?Stairs ?  ?  ?  ?  ?  ? ?Wheelchair Mobility ?  ? ?Modified Rankin (Stroke Patients Only) ?  ? ?  ? ?Balance Overall balance assessment: Needs assistance ?Sitting-balance support: Feet supported ?Sitting balance-Leahy Scale: Good ?  ?  ?Standing balance support: Bilateral upper extremity supported ?Standing balance-Leahy Scale: Fair ?  ?  ?  ?  ?  ?  ?  ?  ?  ?  ?  ?  ?   ? ? ? ?  Pertinent Vitals/Pain Pain Assessment ?Pain  Assessment: No/denies pain  ? ? ?Home Living Family/patient expects to be discharged to:: Private residence ?Living Arrangements: Spouse/significant other;Children ?Available Help at Discharge: Family ?Type of Home: House ?Home Access: Stairs to enter ?  ?Entrance Stairs-Number of Steps: 2 ?  ?Home Layout: One level ?Home Equipment: None ?   ?  ?Prior Function Prior Level of Function : Independent/Modified Independent ?  ?  ?  ?  ?  ?  ?  ?  ?  ? ? ?Hand Dominance  ?   ? ?  ?Extremity/Trunk Assessment  ? Upper Extremity Assessment ?Upper Extremity Assessment: Generalized weakness ?  ? ?Lower Extremity Assessment ?Lower Extremity Assessment: Generalized weakness ?  ? ?Cervical / Trunk Assessment ?Cervical / Trunk Assessment: Normal  ?Communication  ? Communication: No difficulties  ?Cognition Arousal/Alertness: Awake/alert ?Behavior During Therapy: Flat affect ?  ?  ?  ?  ?  ?  ?  ?  ?  ?  ?  ?  ?  ?  ?  ?  ?  ?General Comments: pt oriented x3 (unaware of why she came to the hospital) did tend to show some deficits in problem solving, and often self limiting in session. stated she does not feel normal yet ?  ?  ? ?  ?General Comments   ? ?  ?Exercises    ? ?Assessment/Plan  ?  ?PT Assessment Patient needs continued PT services  ?PT Problem List Decreased activity tolerance;Decreased balance;Decreased mobility;Decreased knowledge of use of DME;Decreased cognition ? ?   ?  ?PT Treatment Interventions Therapeutic exercise;Gait training;Balance training;DME instruction;Stair training;Neuromuscular re-education;Functional mobility training;Therapeutic activities;Patient/family education   ? ?PT Goals (Current goals can be found in the Care Plan section)  ?Acute Rehab PT Goals ?Patient Stated Goal: to rest ?PT Goal Formulation: With patient ?Time For Goal Achievement: 07/02/21 ?Potential to Achieve Goals: Good ? ?  ?Frequency Min 2X/week ?  ? ? ?Co-evaluation   ?  ?  ?  ?  ? ? ?  ?AM-PAC PT "6 Clicks" Mobility  ?Outcome  Measure Help needed turning from your back to your side while in a flat bed without using bedrails?: None ?Help needed moving from lying on your back to sitting on the side of a flat bed without using bedrails?: None ?Help needed moving to and from a bed to a chair (including a wheelchair)?: None ?Help needed standing up from a chair using your arms (e.g., wheelchair or bedside chair)?: None ?Help needed to walk in hospital room?: A Little ?Help needed climbing 3-5 steps with a railing? : A Little ?6 Click Score: 22 ? ?  ?End of Session Equipment Utilized During Treatment: Gait belt ?Activity Tolerance: Patient tolerated treatment well ?Patient left: in chair;with chair alarm set;with call bell/phone within reach ?Nurse Communication: Mobility status ?PT Visit Diagnosis: Other abnormalities of gait and mobility (R26.89);Difficulty in walking, not elsewhere classified (R26.2) ?  ? ?Time: [id number] ?PT Time Calculation (min) (ACUTE ONLY): 17 min ? ? ?Charges:   PT Evaluation ?$PT Eval Low Complexity: 1 Low ?PT Treatments ?$Therapeutic Activity: 23-37 mins ?  ?   ? ? ?Lieutenant Diego PT, DPT ?11:36 AM,06/18/21 ? ?

## 2021-06-19 ENCOUNTER — Emergency Department: Payer: 59

## 2021-06-19 ENCOUNTER — Other Ambulatory Visit: Payer: Self-pay

## 2021-06-19 ENCOUNTER — Emergency Department
Admission: EM | Admit: 2021-06-19 | Discharge: 2021-06-20 | Disposition: A | Discharge status: Home / Self Care | Payer: 59 | Attending: Emergency Medicine | Admitting: Emergency Medicine

## 2021-06-19 DIAGNOSIS — I2510 Atherosclerotic heart disease of native coronary artery without angina pectoris: Secondary | ICD-10-CM | POA: Diagnosis not present

## 2021-06-19 DIAGNOSIS — R0789 Other chest pain: Secondary | ICD-10-CM | POA: Diagnosis not present

## 2021-06-19 DIAGNOSIS — I129 Hypertensive chronic kidney disease with stage 1 through stage 4 chronic kidney disease, or unspecified chronic kidney disease: Secondary | ICD-10-CM | POA: Insufficient documentation

## 2021-06-19 DIAGNOSIS — J449 Chronic obstructive pulmonary disease, unspecified: Secondary | ICD-10-CM | POA: Diagnosis not present

## 2021-06-19 DIAGNOSIS — E1122 Type 2 diabetes mellitus with diabetic chronic kidney disease: Secondary | ICD-10-CM | POA: Diagnosis not present

## 2021-06-19 DIAGNOSIS — R918 Other nonspecific abnormal finding of lung field: Secondary | ICD-10-CM | POA: Diagnosis not present

## 2021-06-19 DIAGNOSIS — N183 Chronic kidney disease, stage 3 unspecified: Secondary | ICD-10-CM

## 2021-06-19 DIAGNOSIS — T443X1A Poisoning by other parasympatholytics [anticholinergics and antimuscarinics] and spasmolytics, accidental (unintentional), initial encounter: Secondary | ICD-10-CM

## 2021-06-19 DIAGNOSIS — Z0389 Encounter for observation for other suspected diseases and conditions ruled out: Secondary | ICD-10-CM | POA: Diagnosis not present

## 2021-06-19 DIAGNOSIS — R079 Chest pain, unspecified: Secondary | ICD-10-CM

## 2021-06-19 DIAGNOSIS — N1831 Chronic kidney disease, stage 3a: Secondary | ICD-10-CM | POA: Insufficient documentation

## 2021-06-19 DIAGNOSIS — R410 Disorientation, unspecified: Secondary | ICD-10-CM | POA: Diagnosis not present

## 2021-06-19 DIAGNOSIS — R531 Weakness: Secondary | ICD-10-CM | POA: Diagnosis not present

## 2021-06-19 DIAGNOSIS — N179 Acute kidney failure, unspecified: Secondary | ICD-10-CM

## 2021-06-19 LAB — COMPREHENSIVE METABOLIC PANEL
ALT: 13 U/L (ref 0–44)
AST: 21 U/L (ref 15–41)
Albumin: 2.8 g/dL — ABNORMAL LOW (ref 3.5–5.0)
Alkaline Phosphatase: 80 U/L (ref 38–126)
Anion gap: 9 (ref 5–15)
BUN: 17 mg/dL (ref 8–23)
CO2: 32 mmol/L (ref 22–32)
Calcium: 9 mg/dL (ref 8.9–10.3)
Chloride: 98 mmol/L (ref 98–111)
Creatinine, Ser: 1.14 mg/dL — ABNORMAL HIGH (ref 0.44–1.00)
GFR, Estimated: 54 mL/min — ABNORMAL LOW (ref >60)
Glucose, Bld: 167 mg/dL — ABNORMAL HIGH (ref 70–99)
Potassium: 3.7 mmol/L (ref 3.5–5.1)
Sodium: 139 mmol/L (ref 135–145)
Total Bilirubin: 0.5 mg/dL (ref 0.3–1.2)
Total Protein: 6.3 g/dL — ABNORMAL LOW (ref 6.5–8.1)

## 2021-06-19 LAB — CBC
HCT: 36.9 % (ref 36.0–46.0)
Hemoglobin: 11 g/dL — ABNORMAL LOW (ref 12.0–15.0)
MCH: 26.5 pg (ref 26.0–34.0)
MCHC: 29.8 g/dL — ABNORMAL LOW (ref 30.0–36.0)
MCV: 88.9 fL (ref 80.0–100.0)
Platelets: 443 10*3/uL — ABNORMAL HIGH (ref 150–400)
RBC: 4.15 MIL/uL (ref 3.87–5.11)
RDW: 14.9 % (ref 11.5–15.5)
WBC: 17.6 10*3/uL — ABNORMAL HIGH (ref 4.0–10.5)
nRBC: 0 % (ref 0.0–0.2)

## 2021-06-19 LAB — BASIC METABOLIC PANEL
Anion gap: 7 (ref 5–15)
BUN: 16 mg/dL (ref 8–23)
CO2: 33 mmol/L — ABNORMAL HIGH (ref 22–32)
Calcium: 9 mg/dL (ref 8.9–10.3)
Chloride: 98 mmol/L (ref 98–111)
Creatinine, Ser: 1.15 mg/dL — ABNORMAL HIGH (ref 0.44–1.00)
GFR, Estimated: 54 mL/min — ABNORMAL LOW (ref >60)
Glucose, Bld: 144 mg/dL — ABNORMAL HIGH (ref 70–99)
Potassium: 3.7 mmol/L (ref 3.5–5.1)
Sodium: 138 mmol/L (ref 135–145)

## 2021-06-19 LAB — GLUCOSE, CAPILLARY
Glucose-Capillary: 125 mg/dL — ABNORMAL HIGH (ref 70–99)
Glucose-Capillary: 137 mg/dL — ABNORMAL HIGH (ref 70–99)
Glucose-Capillary: 150 mg/dL — ABNORMAL HIGH (ref 70–99)

## 2021-06-19 LAB — TROPONIN I (HIGH SENSITIVITY)
Troponin I (High Sensitivity): 10 ng/L (ref <18)
Troponin I (High Sensitivity): 10 ng/L (ref <18)

## 2021-06-19 MED ORDER — CARVEDILOL 6.25 MG PO TABS
6.2500 mg | ORAL_TABLET | Freq: Two times a day (BID) | ORAL | 0 refills | Status: DC
  Filled 2021-06-19: qty 60, 30d supply, fill #0

## 2021-06-19 MED ORDER — SPIRONOLACTONE 25 MG PO TABS
12.5000 mg | ORAL_TABLET | Freq: Every day | ORAL | 0 refills | Status: DC
Start: 2021-06-20 — End: 2022-02-05
  Filled 2021-06-19: qty 15, 30d supply, fill #0

## 2021-06-19 MED ORDER — ALPRAZOLAM 0.25 MG PO TABS
0.2500 mg | ORAL_TABLET | Freq: Every evening | ORAL | 0 refills | Status: AC | PRN
  Filled 2021-06-19: qty 10, 10d supply, fill #0

## 2021-06-19 NOTE — Clinical Social Work Note (Signed)
Occupational Therapy * Physical Therapy * Speech Therapy ?       ? ? ?DATE ___05/03/23________________ ?PATIENT NAME____[name]_________________ ?PATIENT MRN___[id number]_________________ ? ?DIAGNOSIS/DIAGNOSIS CODE __AMS R41.82____________________ ?DATE OF DISCHARGE: __05/03/23____________ ? ?PRIMARY CARE PHYSICIAN ____Meindert Niemeyer______________________ ?PCP PHONE/FAX____[phone]_____________________ ? ?  ? ?Dear Provider (Name: __________________   ?Fax: ___________________________): ?  ?I certify that I have examined this patient and that occupational/physical/speech therapy is necessary on an outpatient basis.   ? ?The patient has expressed interest in completing their recommended course of therapy at your location.  Once a formal order from the patient's primary care physician has been obtained, please contact him/her to schedule an appointment for evaluation at your earliest convenience. ? ? ?[ X ]  Physical Therapy Evaluate and Treat ? ?        [  ]  Occupational Therapy Evaluate and Treat ? ?                                            [  ]  Speech Therapy Evaluate and Treat ? ? ? ? ? ? ?The patient's primary care physician (listed above) must furnish and be responsible for a formal order such that the recommended services may be furnished while under the primary physician's care, and that the plan of care will be established and reviewed every 30 days (or more often if condition necessitates).  ?

## 2021-06-19 NOTE — Discharge Instructions (Signed)
Please follow up within 1-2 weeks with your primary doctor to monitor your blood pressure, your potassium levels, and to screen for depression. ? ?I recommend you do not take benadryl any more ?

## 2021-06-19 NOTE — Consult Note (Signed)
? ?  [facility] CM Inpatient Consult ? ? ?06/19/2021 ? ?Whitney Bernard ?30-Dec-1960 ?[id number] ? ? 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 ?Primary Care Provider:  Evelene Croon, MD, [facility] ? ? ? Plan: Patient will be followed by [facility] RN Care Coordinator for plan support and needs. Admitted with Acute Kidney Injury ?  ?For additional questions or referrals please contact: ?  ?Charlesetta Shanks, RN BSN CCM ?[employer] Liaison ? [phone] business mobile phone ?Toll free office [phone]  ?Fax number: [phone] ?[email or web address] ? ? ?

## 2021-06-19 NOTE — ED Provider Notes (Signed)
? ?[facility] ?Provider Note ? ? ? Event Date/Time  ? First MD Initiated Contact with Patient 06/19/21 2350   ?  (approximate) ? ? ?History  ? ?Chest Pain ? ? ?HPI ? ?Whitney Bernard is a 61 y.o. female with past medical history of pancreatitis, CKD, CAD with prior MI, depression hyperlipidemia hypertension diabetes rheumatoid arthritis and COPD presents with chest pain.  Patient was recently mid to the hospital discharged just this morning after she presented with generalized weakness and confusion in the setting of taking too much Benadryl.  Patient initially told triage that she had had chest pain and hospital but was told that she had normal labs but patient tells me that her pain started after she got home.  Pressure-like sensation left side of her chest that is nonradiating nonexertional nonpleuritic denies associated shortness of breath nausea diaphoresis.  Denies hemoptysis lower extremity edema.  Does have history of MI says that this feels somewhat similar.  No history of PE she is on aspirin not anticoagulated.  Denies cough fevers chills.  No abdominal pain. ? ?  ? ?Past Medical History:  ?Diagnosis Date  ? Acute pancreatitis   ? CAD (coronary artery disease)   ? a. 08/2017 Neg MV; b. 01/2019 CTA Chest: Mild cor Ca2+; c. 02/2019 NSTEMI/PCI: LCX 95p/m (2.75x15 Resolute Onyx DES); d. 05/2019 Cath: LM nl, LAD nl, LCX patent stent, RCA 30ost/p-->Med rx.  ? Chronic pain syndrome   ? CKD (chronic kidney disease), stage III ([place])   ? COPD (chronic obstructive pulmonary disease) ([place])   ? Depression   ? Diabetes mellitus without complication ([place])   ? Headache(784.0)   ? migraines  ? History of echocardiogram   ? a. 04/2018 Echo: EF 55-60%. No significant valvular dzs; b. 02/2019 Echo: EF 60-65%. Nl RV fxn.  ? Hypercholesteremia   ? Hypertension   ? Hyperthyroidism   ? Hypokalemia   ? Migraine   ? Narcotic abuse ([place])   ? RA (rheumatoid arthritis) ([place])   ? ? ?Patient Active Problem List  ?  Diagnosis Date Noted  ? Uremia   ? Acute cystitis without hematuria   ? Acute urinary retention   ? AKI (acute kidney injury) ([place])   ? Generalized weakness 06/15/2021  ? Anticholinergic syndrome, accidental or unintentional, initial encounter 06/15/2021  ? AMS (altered mental status) 06/15/2021  ? Hypertensive urgency 03/28/2020  ? Microcytic anemia 11/02/2019  ? CAD (coronary artery disease) 06/06/2019  ? Depression 06/06/2019  ? Unstable angina (HCC)   ? COPD (chronic obstructive pulmonary disease) ([place])   ? Hypertension   ? CKD (chronic kidney disease), stage IIIa   ? Chest pain, unspecified   ? NSTEMI (non-ST elevated myocardial infarction) ([place])   ? Hyperkalemia   ? Elevated troponin   ? GERD (gastroesophageal reflux disease)   ? Type II diabetes mellitus with renal manifestations ([place])   ? Chest pain, non-cardiac 08/23/2017  ? Protein-calorie malnutrition, severe 12/26/2014  ? 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 Loose stools   ? Diarrhea   ? Hypokalemia 12/24/2014  ? BP (high blood pressure) 02/23/2013  ? Misuse of prescription only drugs 02/23/2013  ? Depression with anxiety 06/22/2012  ? Colitis 05/11/2012  ? Clinical depression 05/11/2012  ? Hypercholesterolemia 05/11/2012  ? Gastroduodenal ulcer 03/23/2012  ? ? ? ?Physical Exam  ?Triage Vital Signs: ?ED Triage Vitals  ?Enc Vitals Group  ?   BP 06/19/21 1716 (!) 170/105  ?   Pulse Rate  06/19/21 1716 87  ?   Resp 06/19/21 1716 18  ?   Temp 06/19/21 1716 98.6 ?F (37 ?C)  ?   Temp Source 06/19/21 2023 Oral  ?   SpO2 06/19/21 1716 93 %  ?   Weight --   ?   Height --   ?   Head Circumference --   ?   Peak Flow --   ?   Pain Score 06/19/21 1715 6  ?   Pain Loc --   ?   Pain Edu? --   ?   Excl. in [place]? --   ? ? ?Most recent vital signs: ?Vitals:  ? 06/20/21 0030 06/20/21 0100  ?BP: (!) 174/100 (!) 173/99  ?Pulse: 88 (!) 103  ?Resp: (!) 24 20  ?Temp:    ?SpO2: 98% 98%  ? ? ? ?General: Awake, no distress.  ?CV:  Good peripheral perfusion.  ?Resp:  Normal effort.   ?Abd:  No distention. Soft nontender ?Neuro:             Awake, Alert, Oriented x 3  ?Other:   ? ? ?ED Results / Procedures / Treatments  ?Labs ?(all labs ordered are listed, but only abnormal results are displayed) ?Labs Reviewed  ?BASIC METABOLIC PANEL - Abnormal; Notable for the following components:  ?    Result Value  ? CO2 33 (*)   ? Glucose, Bld 144 (*)   ? Creatinine, Ser 1.15 (*)   ? GFR, Estimated 54 (*)   ? All other components within normal limits  ?CBC - Abnormal; Notable for the following components:  ? WBC 17.6 (*)   ? Hemoglobin 11.0 (*)   ? MCHC 29.8 (*)   ? Platelets 443 (*)   ? All other components within normal limits  ?TROPONIN I (HIGH SENSITIVITY)  ?TROPONIN I (HIGH SENSITIVITY)  ? ? ? ?EKG ? ?EKG shows normal sinus rhythm low voltage normal axis no acute ischemic changes ? ? ?RADIOLOGY ?I reviewed the CXR which does not show any acute cardiopulmonary process; agree with radiology report  ? ? ? ?PROCEDURES: ? ?Critical Care performed: No ? ?Procedures ? ?The patient is on the cardiac monitor to evaluate for evidence of arrhythmia and/or significant heart rate changes. ? ? ?MEDICATIONS ORDERED IN ED: ?Medications  ?oxyCODONE-acetaminophen (PERCOCET/ROXICET) 5-325 MG per tablet 1 tablet (1 tablet Oral Given 06/20/21 0058)  ?iohexol (OMNIPAQUE) 350 MG/ML injection 75 mL (75 mLs Intravenous Contrast Given 06/20/21 0040)  ? ? ? ?IMPRESSION / MDM / ASSESSMENT AND PLAN / ED COURSE  ?I reviewed the triage vital signs and the nursing notes. ?             ?               ? ?Differential diagnosis includes, but is not limited to, ACS, pulmonary embolism, pleurisy, GI, musculoskeletal ? ?Patient is a 61 year old female presenting with chest pain.  Patient just discharged in the hospital this morning after admission for confusion in setting of unintentional Benadryl overdose.  Patient says she developed left-sided chest pressure upon discharge.  There is no associated symptoms its nonradiating nonexertional  but does feel similar to prior MI.  Patient's vital signs are notable for hypertension otherwise within normal limits.  She appears well on exam.  Abdomen is soft and nontender does not appear fluid overloaded.  I reviewed her EKG which does not show any ischemic changes is low voltage.  Troponins x2 are negative.  Overall my  suspicion for ACS with her reassuring EKG and 2 negative troponins is low.  However do feel that PE should be further evaluated for obtain a CT angio of the chest. ? ?CTA is negative for pulmonary embolism.  Given patient's 2 negative troponins nonischemic EKG I think that she is appropriate for outpatient follow-up.  Discussed return precautions. ?  ? ? ?FINAL CLINICAL IMPRESSION(S) / ED DIAGNOSES  ? ?Final diagnoses:  ?Chest pain, unspecified type  ? ? ? ?Rx / DC Orders  ? ?ED Discharge Orders   ? ? None  ? ?  ? ? ? ?Note:  This document was prepared using Dragon voice recognition software and may include unintentional dictation errors. ?  ?Rada Hay, MD ?06/20/21 0103 ? ?

## 2021-06-19 NOTE — Progress Notes (Signed)
Removed foley per verbal oder from MD Dareen PianoAnderson. Educated patient on plan to monitor for urinary output within 6 hours to ensure of no urinary retention ?

## 2021-06-19 NOTE — Discharge Summary (Signed)
? ? ?Physician Discharge Summary  ?Patient: Whitney Bernard [id number] DOB: 30-Mar-1960   Code Status: Full Code ?Admit date: 06/15/2021 ?Discharge date: 06/19/2021 ?Disposition: Home, outpatient PT, OT ?PCP: Lorelee Market, MD ? ?Recommendations for Outpatient Follow-up:  ?Follow up with PCP within 1-2 weeks ?Recommend depression screening ?Metabolic panel and kidney function monitoring ?Blood pressure monitoring ? ?Discharge Diagnoses:  ?Principal Problem: ?  AMS (altered mental status) ?Active Problems: ?  Anticholinergic syndrome, accidental or unintentional, initial encounter ?  Generalized weakness ?  COPD (chronic obstructive pulmonary disease) ([place]) ?  Hypertension ?  CKD (chronic kidney disease), stage IIIa ?  Type II diabetes mellitus with renal manifestations ([place]) ?  CAD (coronary artery disease) ?  Acute cystitis without hematuria ?  Acute urinary retention ?  AKI (acute kidney injury) ([place]) ?  Uremia ? ?Brief Hospital Course Summary: ?Whitney Bernard is a 61 y.o. 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 ?  ?They presented from home to the ED on 06/15/2021 with generalized weakness and confusion x 1 days. Suspected to be due to unintentional diphenhydramine overdose.  Poison control was contacted and guided further therapy. ?  ?In the ED, it was found that they had stable vital signs with tachycardia up to 110s.   ?Significant findings included TSH 0.198, Na+ 132, K+ 5.1, Cr 4.24 from baseline around 1.2, Calcium 7.0, negative troponin, negative LA, WBC 27.4, hgb 11.4, platelets 482, urinalysis negative, negative salicylate and acetaminophen levels. Abdominal CT negative for acute disease. Chest xray negative for acute disease. ?  ?They were initially treated with carvedilol, calcium, CTX, sodium bicarb drip,  NS bolus 2L. ?  ?Metabolic encephalopathy  AMS- somnolent on admission with acute delirium thought to be accidental overdose of diphenhydramine with anticholinergic symptoms. Poison control was contacted for recommendations.  Patient improved gradually with supportive care and treatment of her underlying electrolyte derangements as listed below. ?Additionally, patient had acute urinary retention which was treated with Foley catheter.  Foley was removed prior to discharge and patient had a successful voiding trial prior to leaving the hospital. ?  ?Hypocalcemia  hypokalemia-repleted and normal on day of discharge.  Patient was started on spironolactone for better blood pressure control and to supplement her potassium repletion.  ?  ?Low TSH- TSH 0.198 on admission but had normal T4 and free T3 was low at 1.6.Thyroid US showed solitary nodule which did not meet criteria for biopsy. ?  ?Acute on Chronic kidney disease stage III- Cr >4 on admission. Trending down to 1.23 which is about her baseline or better. ?   ?HTN  CAD - no chest pain, negative troponins with non-ischemic ECG on admission. Hypertensive.  Once AKI had resolved, home antihypertensives were titrated back on with the addition of spironolactone for better control and patient was discharged with moderately well-controlled blood pressure ?  ?Anxiety/depression-. Denies intentional OD or self harm thoughts.  Husband also does not believe that the overdose was intentional.  She states that she knows she cannot take Benadryl anymore.  Potentially worsened depression with significant flat effect. Her home medications were continued inpatient but would recommend further depression screening by PCP.  ? ?Discharge Condition: Stable, improved ?Recommended discharge diet: Regular healthy diet ? ?Consultations: ?Poison control ? ?Procedures/Studies: ?Thyroid US  ? ?Allergies as of 06/19/2021   ? ?   Reactions  ?  Almond Oil Hives  ? Atorvastatin Other (See Comments)   ? myalgias  ? Rosuvastatin Other (See Comments)  ? Muscle pain  ? ?  ? ?  ?Medication List  ?  ? ?STOP taking these medications   ? ?atorvastatin 10 MG tablet ?Commonly known as: LIPITOR ?  ?atorvastatin 40 MG tablet ?Commonly known as: LIPITOR ?  ?butalbital-acetaminophen-caffeine 50-325-40 MG tablet ?Commonly known as: FIORICET ?  ?Butalbital-APAP-Caffeine 50-325-40 MG capsule ?  ?ezetimibe 10 MG tablet ?Commonly known as: ZETIA ?  ?omeprazole 20 MG capsule ?Commonly known as: PRILOSEC ?  ?triamcinolone cream 0.1 % ?Commonly known as: KENALOG ?  ? ?  ? ?TAKE these medications   ? ?ALPRAZolam 0.25 MG tablet ?Commonly known as: Duanne Moron ?Take 1 tablet (0.25 mg total) by mouth at bedtime as needed for up to 10 days for anxiety. ?What changed:  ?medication strength ?how much to take ?how to take this ?when to take this ?reasons to take this ?Another medication with the same name was removed. Continue taking this medication, and follow the directions you see here. ?  ?ARIPiprazole 2 MG tablet ?Commonly known as: ABILIFY ?Take 0.5 tablet by mouth every day for 2 weeks then increase to 1 tablet by mouth daily ?(take 0.5 tablet by oral route  every day for 2 weeks then increase to 1 po q day) ?What changed:  ?how much to take ?when to take this ?  ?aspirin 81 MG EC tablet ?Take 1 tablet (81 mg total) by mouth daily. ?  ?busPIRone 10 MG tablet ?Commonly known as: BUSPAR ?take one tablet by  mouth 3 times a day ?(take 1 tablet by oral route 3 times every day) ?  ?carvedilol 6.25 MG tablet ?Commonly known as: COREG ?Take 1 tablet (6.25 mg total) by mouth 2 (two) times daily. ?What changed:  ?medication strength ?how much to take ?  ?FLUoxetine 20 MG capsule ?Commonly known as: PROZAC ?Take 1 capsule by mouth every day in the morning ?What changed: Another medication with the same name was removed. Continue taking this medication, and follow the directions you see here. ?  ?furosemide 40 MG tablet ?Commonly known as:  LASIX ?TAKE 1 TABLET BY MOUTH TWICE DAILY ?What changed: Another medication with the same name was removed. Continue taking this medication, and follow the directions you see here. ?  ?ibuprofen 200 MG tablet ?Commonly known as: ADVIL ?Take 600 mg by mouth every 4 (four) hours as needed for mild pain or moderate pain. ?  ?lisinopril 20 MG tablet ?Commonly known as: ZESTRIL ?Take 1 tablet by mouth once daily ?(take 1 tablet by oral route  every day) ?  ?metFORMIN 500 MG tablet ?Commonly known as: GLUCOPHAGE ?take 1 tablet by mouth every day with a meal ?  ?multivitamin with minerals tablet ?Take 1 tablet by mouth daily. ?  ?potassium chloride SA 20 MEQ tablet ?Commonly known as: KLOR-CON M ?Take 1 tablet (20 mEq total) by mouth 2 (two) times daily. ?What changed: Another medication with the same name was removed. Continue taking this medication, and follow the directions you see here. ?  ?spironolactone 25 MG tablet ?Commonly known as: ALDACTONE ?Take 1/2 tablet (12.5 mg total) by mouth daily. ?(Take 0.5 tablets (12.5 mg total) by mouth daily.) ?Start taking on: Jun 20, 2021 ?  ? ?  ? ? ? ?Subjective   ?Pt reports feeling better. She overall feels weird all over but is not able to put into words what she means by that  in any further detail.  ?Objective  ?Blood pressure (!) 157/85, pulse 84, temperature 99.2 ?F (37.3 ?C), resp. rate 16, height 5\' 4"  (1.626 m), weight 74.8 kg, SpO2 95 %.  ? ?General: Pt is alert, awake, not in acute distress ?Cardiovascular: RRR, S1/S2 +, no rubs, no gallops ?Respiratory: CTA bilaterally, no wheezing, no rhonchi ?Abdominal: Soft, NT, ND, bowel sounds + ?Extremities: no edema, no cyanosis ?Neuro: ambulating normally ? ?The results of significant diagnostics from this hospitalization (including imaging, microbiology, ancillary and laboratory) are listed below for reference.  ? ?Imaging studies: ?CT ABDOMEN PELVIS WO CONTRAST ? ?Result Date: 06/15/2021 ?CLINICAL DATA:  Abdominal pain,  weakness, fever EXAM: CT ABDOMEN AND PELVIS WITHOUT CONTRAST TECHNIQUE: Multidetector CT imaging of the abdomen and pelvis was performed following the standard protocol without IV contrast. RADIATION DOSE REDUCTION: Th

## 2021-06-19 NOTE — Patient Outreach (Signed)
Received a hospital discharge referral from Charlesetta ShanksVictoria Brewer, [facility] Liaison for Ms. Rathbone. ?I have assigned Elliot CousinLisa Matthews, RN to call for follow up and determine if there are any Case Management needs.  ?  ?Iverson AlaminLaura Mal Asher, CBCS, CMAA ?[facility] Care Management Assistant ?[facility] Care Management ?[phone]   ?

## 2021-06-19 NOTE — ED Triage Notes (Signed)
Pt states that she has had chest pain since this morning when she was admitted upstairs but "they told me my lab work was normal" and sent her home. Central CP, no radiation. Reproducible with palpation. EMS gave 324 asp. Was here for AKI after taking too many benadryl tablets for her fever and cough.  ?

## 2021-06-20 ENCOUNTER — Other Ambulatory Visit: Payer: Self-pay

## 2021-06-20 ENCOUNTER — Emergency Department: Payer: 59

## 2021-06-20 DIAGNOSIS — R531 Weakness: Secondary | ICD-10-CM | POA: Diagnosis not present

## 2021-06-20 DIAGNOSIS — E1122 Type 2 diabetes mellitus with diabetic chronic kidney disease: Secondary | ICD-10-CM | POA: Diagnosis not present

## 2021-06-20 DIAGNOSIS — R410 Disorientation, unspecified: Secondary | ICD-10-CM | POA: Diagnosis not present

## 2021-06-20 DIAGNOSIS — I129 Hypertensive chronic kidney disease with stage 1 through stage 4 chronic kidney disease, or unspecified chronic kidney disease: Secondary | ICD-10-CM | POA: Diagnosis not present

## 2021-06-20 DIAGNOSIS — R918 Other nonspecific abnormal finding of lung field: Secondary | ICD-10-CM | POA: Diagnosis not present

## 2021-06-20 DIAGNOSIS — Z0389 Encounter for observation for other suspected diseases and conditions ruled out: Secondary | ICD-10-CM | POA: Diagnosis not present

## 2021-06-20 DIAGNOSIS — J449 Chronic obstructive pulmonary disease, unspecified: Secondary | ICD-10-CM | POA: Diagnosis not present

## 2021-06-20 DIAGNOSIS — N1831 Chronic kidney disease, stage 3a: Secondary | ICD-10-CM | POA: Diagnosis not present

## 2021-06-20 DIAGNOSIS — R0789 Other chest pain: Secondary | ICD-10-CM | POA: Diagnosis not present

## 2021-06-20 DIAGNOSIS — I2510 Atherosclerotic heart disease of native coronary artery without angina pectoris: Secondary | ICD-10-CM | POA: Diagnosis not present

## 2021-06-20 LAB — CULTURE, BLOOD (ROUTINE X 2)
Culture: NO GROWTH
Specimen Description: ADEQUATE

## 2021-06-20 MED ORDER — OXYCODONE-ACETAMINOPHEN 5-325 MG PO TABS
1.0000 | ORAL_TABLET | Freq: Once | ORAL | Status: AC
  Administered 2021-06-20: 1 via ORAL
  Filled 2021-06-20: qty 1

## 2021-06-20 MED ORDER — IOHEXOL 350 MG/ML SOLN
75.0000 mL | Freq: Once | INTRAVENOUS | Status: AC | PRN
  Administered 2021-06-20: 75 mL via INTRAVENOUS

## 2021-06-20 MED ORDER — OXYCODONE-ACETAMINOPHEN 5-325 MG PO TABS
1.0000 | ORAL_TABLET | ORAL | 0 refills | Status: AC | PRN
  Filled 2021-06-20: qty 20, 4d supply, fill #0

## 2021-06-21 ENCOUNTER — Other Ambulatory Visit: Payer: Self-pay

## 2021-06-21 MED ORDER — BUTALBITAL-APAP-CAFFEINE 50-325-40 MG PO TABS
ORAL_TABLET | ORAL | 0 refills | Status: DC
  Filled 2021-06-21: qty 90, 15d supply, fill #0

## 2021-06-21 MED ORDER — ALPRAZOLAM 0.5 MG PO TABS
ORAL_TABLET | ORAL | 3 refills | Status: DC
  Filled ????-??-??: fill #0

## 2021-06-24 ENCOUNTER — Other Ambulatory Visit: Payer: Self-pay

## 2021-06-24 ENCOUNTER — Other Ambulatory Visit: Payer: Self-pay | Admitting: *Deleted

## 2021-06-24 LAB — CULTURE, BLOOD (ROUTINE X 2): Culture: NO GROWTH

## 2021-06-24 NOTE — Patient Outreach (Signed)
[facility]) Care Management ? ?06/24/2021 ? ?NAELANI FROIO ?12-Apr-1960 ?[id number] ? ?Transition of care call  ?Referral received:06/19/2021 ?Initial outreach: 06/24/2021 ?Insurance: [employer]/ [employer] ? ?Initial telephone call to patient, 2 HIPAA identifiers verified. No ongoing care management needs identified. Pt reports she has a good support system with family and spouse. Pt has all her medications and aware of Active health and MyChart for [facility] Health benefits. Pt reports her upcoming appointment. Pt denies any issues or related needs at this time and states she, "feels better".  ? ?Offered ongoing transition of care call or follow up however pt denied indicating she is doing well and does not have any additional needs at this time related to her recent hospitalization with no further symptoms. ? ?No needs and case will be closed. ? ?Elliot Cousin, RN ?Care Management Coordinator ?[facility] Customer service manager ?Main Office [phone]  ?

## 2021-06-26 ENCOUNTER — Other Ambulatory Visit: Payer: Self-pay

## 2021-06-27 ENCOUNTER — Other Ambulatory Visit: Payer: Self-pay

## 2021-06-27 DIAGNOSIS — E118 Type 2 diabetes mellitus with unspecified complications: Secondary | ICD-10-CM | POA: Diagnosis not present

## 2021-06-27 DIAGNOSIS — E782 Mixed hyperlipidemia: Secondary | ICD-10-CM | POA: Diagnosis not present

## 2021-06-27 DIAGNOSIS — N179 Acute kidney failure, unspecified: Secondary | ICD-10-CM | POA: Diagnosis not present

## 2021-06-27 DIAGNOSIS — E538 Deficiency of other specified B group vitamins: Secondary | ICD-10-CM | POA: Diagnosis not present

## 2021-06-27 DIAGNOSIS — D509 Iron deficiency anemia, unspecified: Secondary | ICD-10-CM | POA: Diagnosis not present

## 2021-06-27 DIAGNOSIS — T7840XD Allergy, unspecified, subsequent encounter: Secondary | ICD-10-CM | POA: Diagnosis not present

## 2021-06-27 DIAGNOSIS — I10 Essential (primary) hypertension: Secondary | ICD-10-CM | POA: Diagnosis not present

## 2021-06-28 ENCOUNTER — Other Ambulatory Visit: Payer: Self-pay

## 2021-06-28 MED ORDER — POTASSIUM CHLORIDE CRYS ER 20 MEQ PO TBCR
EXTENDED_RELEASE_TABLET | ORAL | 5 refills | Status: DC
  Filled 2021-06-28: qty 60, 30d supply, fill #0
  Filled 2021-07-30: qty 60, 30d supply, fill #1
  Filled 2021-08-26: qty 60, 30d supply, fill #2
  Filled 2021-09-13: qty 60, 30d supply, fill #3
  Filled 2021-10-17: qty 60, 30d supply, fill #4
  Filled 2021-11-11: qty 60, 30d supply, fill #5

## 2021-07-01 ENCOUNTER — Other Ambulatory Visit: Payer: Self-pay

## 2021-07-01 MED ORDER — POTASSIUM CHLORIDE CRYS ER 20 MEQ PO TBCR
EXTENDED_RELEASE_TABLET | ORAL | 5 refills | Status: DC
  Filled 2021-07-01 – 2022-01-16 (×2): qty 60, 30d supply, fill #0

## 2021-07-09 ENCOUNTER — Other Ambulatory Visit: Payer: Self-pay

## 2021-07-17 ENCOUNTER — Other Ambulatory Visit: Payer: Self-pay

## 2021-07-17 MED ORDER — ALPRAZOLAM 0.25 MG PO TABS
ORAL_TABLET | ORAL | 3 refills | Status: DC
  Filled 2021-07-17: qty 60, 30d supply, fill #0
  Filled 2021-07-23: qty 60, fill #0
  Filled 2021-07-24: qty 60, 30d supply, fill #0
  Filled 2021-08-21: qty 60, 30d supply, fill #1
  Filled 2021-09-19: qty 60, 30d supply, fill #2
  Filled 2021-10-18: qty 60, 30d supply, fill #3

## 2021-07-17 MED ORDER — BUTALBITAL-APAP-CAFFEINE 50-300-40 MG PO CAPS
ORAL_CAPSULE | ORAL | 2 refills | Status: DC
  Filled 2021-07-17 – 2021-07-22 (×2): qty 90, 30d supply, fill #0
  Filled 2021-08-19: qty 10, 3d supply, fill #1
  Filled ????-??-??: fill #1

## 2021-07-18 ENCOUNTER — Other Ambulatory Visit: Payer: Self-pay

## 2021-07-19 ENCOUNTER — Other Ambulatory Visit: Payer: Self-pay

## 2021-07-22 ENCOUNTER — Other Ambulatory Visit: Payer: Self-pay

## 2021-07-22 MED ORDER — BUTALBITAL-APAP-CAFF-COD 50-325-40-30 MG PO CAPS
ORAL_CAPSULE | ORAL | 2 refills | Status: DC
  Filled 2021-07-22: qty 90, 15d supply, fill #0

## 2021-07-23 ENCOUNTER — Other Ambulatory Visit: Payer: Self-pay

## 2021-07-24 ENCOUNTER — Other Ambulatory Visit: Payer: Self-pay

## 2021-07-25 ENCOUNTER — Other Ambulatory Visit: Payer: Self-pay

## 2021-07-26 ENCOUNTER — Other Ambulatory Visit: Payer: Self-pay

## 2021-07-30 ENCOUNTER — Other Ambulatory Visit: Payer: Self-pay

## 2021-08-02 ENCOUNTER — Other Ambulatory Visit: Payer: Self-pay

## 2021-08-06 ENCOUNTER — Other Ambulatory Visit: Payer: Self-pay

## 2021-08-08 ENCOUNTER — Other Ambulatory Visit: Payer: Self-pay

## 2021-08-13 ENCOUNTER — Other Ambulatory Visit: Payer: Self-pay

## 2021-08-16 ENCOUNTER — Other Ambulatory Visit: Payer: Self-pay

## 2021-08-19 ENCOUNTER — Other Ambulatory Visit: Payer: Self-pay

## 2021-08-21 ENCOUNTER — Other Ambulatory Visit: Payer: Self-pay

## 2021-08-21 MED ORDER — BUTALBITAL-APAP-CAFFEINE 50-325-40 MG PO TABS
ORAL_TABLET | ORAL | 0 refills | Status: DC
  Filled 2021-08-21: qty 90, 30d supply, fill #0

## 2021-08-22 ENCOUNTER — Other Ambulatory Visit: Payer: Self-pay

## 2021-08-27 ENCOUNTER — Other Ambulatory Visit: Payer: Self-pay

## 2021-08-30 ENCOUNTER — Other Ambulatory Visit: Payer: Self-pay

## 2021-09-02 ENCOUNTER — Other Ambulatory Visit: Payer: Self-pay

## 2021-09-03 ENCOUNTER — Other Ambulatory Visit: Payer: Self-pay

## 2021-09-04 ENCOUNTER — Other Ambulatory Visit: Payer: Self-pay

## 2021-09-07 ENCOUNTER — Other Ambulatory Visit: Payer: Self-pay

## 2021-09-09 ENCOUNTER — Other Ambulatory Visit: Payer: Self-pay

## 2021-09-09 MED ORDER — BUTALBITAL-APAP-CAFF-COD 50-325-40-30 MG PO CAPS: ORAL_CAPSULE | ORAL | 2 refills | Status: DC

## 2021-09-10 ENCOUNTER — Other Ambulatory Visit: Payer: Self-pay

## 2021-09-10 MED ORDER — BUTALBITAL-APAP-CAFFEINE 50-325-40 MG PO TABS
ORAL_TABLET | ORAL | 2 refills | Status: DC
Start: 2021-09-10 — End: 2023-02-25
  Filled 2021-09-10: qty 90, 15d supply, fill #0
  Filled 2021-09-19: qty 90, fill #0
  Filled 2021-09-19: qty 90, 30d supply, fill #0
  Filled 2021-10-18: qty 90, 30d supply, fill #1
  Filled 2021-11-15: qty 90, 30d supply, fill #2

## 2021-09-13 ENCOUNTER — Other Ambulatory Visit: Payer: Self-pay | Admitting: Student in an Organized Health Care Education/Training Program

## 2021-09-13 ENCOUNTER — Other Ambulatory Visit: Payer: Self-pay

## 2021-09-15 ENCOUNTER — Other Ambulatory Visit: Payer: Self-pay

## 2021-09-16 ENCOUNTER — Other Ambulatory Visit: Payer: Self-pay

## 2021-09-18 ENCOUNTER — Other Ambulatory Visit: Payer: Self-pay

## 2021-09-18 MED ORDER — CARVEDILOL 6.25 MG PO TABS
6.2500 mg | ORAL_TABLET | Freq: Two times a day (BID) | ORAL | 5 refills | Status: DC
  Filled 2021-09-18: qty 60, 30d supply, fill #0

## 2021-09-18 MED ORDER — FLUOXETINE HCL 20 MG PO CAPS
ORAL_CAPSULE | ORAL | 5 refills | Status: DC
  Filled 2021-09-18: qty 30, 30d supply, fill #0
  Filled 2021-10-14: qty 30, 30d supply, fill #1
  Filled 2021-11-11: qty 30, 30d supply, fill #2
  Filled 2021-12-13: qty 30, 30d supply, fill #3
  Filled 2022-01-16: qty 30, 30d supply, fill #4
  Filled 2022-03-04: qty 30, 30d supply, fill #5

## 2021-09-19 ENCOUNTER — Other Ambulatory Visit: Payer: Self-pay

## 2021-10-03 ENCOUNTER — Other Ambulatory Visit: Payer: Self-pay

## 2021-10-07 ENCOUNTER — Other Ambulatory Visit: Payer: Self-pay

## 2021-10-07 MED ORDER — ARIPIPRAZOLE 2 MG PO TABS
ORAL_TABLET | ORAL | 2 refills | Status: DC
  Filled 2021-10-07: qty 30, 30d supply, fill #0
  Filled 2021-11-11: qty 30, 30d supply, fill #1
  Filled 2021-12-13: qty 30, 30d supply, fill #2

## 2021-10-14 ENCOUNTER — Other Ambulatory Visit: Payer: Self-pay

## 2021-10-15 ENCOUNTER — Other Ambulatory Visit: Payer: Self-pay

## 2021-10-17 ENCOUNTER — Other Ambulatory Visit: Payer: Self-pay

## 2021-10-18 ENCOUNTER — Other Ambulatory Visit: Payer: Self-pay

## 2021-11-07 ENCOUNTER — Other Ambulatory Visit: Payer: Self-pay

## 2021-11-08 ENCOUNTER — Other Ambulatory Visit: Payer: Self-pay

## 2021-11-11 ENCOUNTER — Other Ambulatory Visit: Payer: Self-pay

## 2021-11-12 ENCOUNTER — Other Ambulatory Visit: Payer: Self-pay

## 2021-11-12 ENCOUNTER — Other Ambulatory Visit (HOSPITAL_COMMUNITY): Payer: Self-pay

## 2021-11-14 ENCOUNTER — Other Ambulatory Visit: Payer: Self-pay

## 2021-11-14 DIAGNOSIS — I10 Essential (primary) hypertension: Secondary | ICD-10-CM | POA: Diagnosis not present

## 2021-11-14 DIAGNOSIS — E118 Type 2 diabetes mellitus with unspecified complications: Secondary | ICD-10-CM | POA: Diagnosis not present

## 2021-11-14 DIAGNOSIS — E538 Deficiency of other specified B group vitamins: Secondary | ICD-10-CM | POA: Diagnosis not present

## 2021-11-14 DIAGNOSIS — Z1231 Encounter for screening mammogram for malignant neoplasm of breast: Secondary | ICD-10-CM | POA: Diagnosis not present

## 2021-11-14 MED ORDER — ALPRAZOLAM 0.25 MG PO TABS
ORAL_TABLET | ORAL | 3 refills | Status: DC
  Filled 2021-11-15: qty 60, 30d supply, fill #0
  Filled 2021-12-13: qty 60, 30d supply, fill #1
  Filled 2022-01-10: qty 60, 30d supply, fill #2
  Filled 2022-02-11: qty 60, 30d supply, fill #3

## 2021-11-15 ENCOUNTER — Other Ambulatory Visit: Payer: Self-pay

## 2021-11-18 ENCOUNTER — Other Ambulatory Visit: Payer: Self-pay

## 2021-12-10 ENCOUNTER — Other Ambulatory Visit: Payer: Self-pay

## 2021-12-13 ENCOUNTER — Other Ambulatory Visit: Payer: Self-pay

## 2021-12-16 ENCOUNTER — Other Ambulatory Visit: Payer: Self-pay

## 2021-12-16 MED ORDER — BUTALBITAL-APAP-CAFFEINE 50-325-40 MG PO TABS
1.0000 | ORAL_TABLET | Freq: Three times a day (TID) | ORAL | 1 refills | Status: DC | PRN
  Filled 2021-12-19: qty 10, 4d supply, fill #0
  Filled 2021-12-19: qty 80, 26d supply, fill #0
  Filled 2022-01-10: qty 90, 30d supply, fill #1

## 2021-12-19 ENCOUNTER — Other Ambulatory Visit: Payer: Self-pay

## 2021-12-26 ENCOUNTER — Other Ambulatory Visit: Payer: Self-pay

## 2021-12-30 ENCOUNTER — Other Ambulatory Visit: Payer: Self-pay

## 2022-01-08 ENCOUNTER — Other Ambulatory Visit: Payer: Self-pay

## 2022-01-10 ENCOUNTER — Other Ambulatory Visit: Payer: Self-pay

## 2022-01-13 ENCOUNTER — Other Ambulatory Visit: Payer: Self-pay

## 2022-01-14 ENCOUNTER — Other Ambulatory Visit: Payer: Self-pay

## 2022-01-16 ENCOUNTER — Other Ambulatory Visit: Payer: Self-pay

## 2022-01-16 MED ORDER — ARIPIPRAZOLE 2 MG PO TABS
1.0000 mg | ORAL_TABLET | Freq: Every day | ORAL | 2 refills | Status: DC
  Filled 2022-01-16: qty 30, 30d supply, fill #0
  Filled 2022-03-04: qty 30, 30d supply, fill #1
  Filled 2022-04-02: qty 30, 30d supply, fill #2

## 2022-01-28 ENCOUNTER — Other Ambulatory Visit: Payer: Self-pay

## 2022-01-28 ENCOUNTER — Emergency Department: Payer: 59

## 2022-01-28 ENCOUNTER — Inpatient Hospital Stay
Admission: EM | Admit: 2022-01-28 | Discharge: 2022-02-05 | DRG: 871 | Disposition: A | Discharge status: Home / Self Care | Payer: 59 | Attending: Internal Medicine | Admitting: Internal Medicine

## 2022-01-28 DIAGNOSIS — F419 Anxiety disorder, unspecified: Secondary | ICD-10-CM | POA: Diagnosis present

## 2022-01-28 DIAGNOSIS — K5730 Diverticulosis of large intestine without perforation or abscess without bleeding: Secondary | ICD-10-CM | POA: Diagnosis not present

## 2022-01-28 DIAGNOSIS — J449 Chronic obstructive pulmonary disease, unspecified: Secondary | ICD-10-CM | POA: Diagnosis present

## 2022-01-28 DIAGNOSIS — N390 Urinary tract infection, site not specified: Secondary | ICD-10-CM | POA: Diagnosis not present

## 2022-01-28 DIAGNOSIS — E1129 Type 2 diabetes mellitus with other diabetic kidney complication: Secondary | ICD-10-CM | POA: Diagnosis present

## 2022-01-28 DIAGNOSIS — J9811 Atelectasis: Secondary | ICD-10-CM | POA: Diagnosis not present

## 2022-01-28 DIAGNOSIS — R6520 Severe sepsis without septic shock: Secondary | ICD-10-CM | POA: Diagnosis not present

## 2022-01-28 DIAGNOSIS — N1831 Chronic kidney disease, stage 3a: Secondary | ICD-10-CM | POA: Diagnosis present

## 2022-01-28 DIAGNOSIS — E871 Hypo-osmolality and hyponatremia: Secondary | ICD-10-CM | POA: Diagnosis present

## 2022-01-28 DIAGNOSIS — Z803 Family history of malignant neoplasm of breast: Secondary | ICD-10-CM

## 2022-01-28 DIAGNOSIS — R109 Unspecified abdominal pain: Secondary | ICD-10-CM | POA: Diagnosis not present

## 2022-01-28 DIAGNOSIS — R6510 Systemic inflammatory response syndrome (SIRS) of non-infectious origin without acute organ dysfunction: Secondary | ICD-10-CM | POA: Diagnosis not present

## 2022-01-28 DIAGNOSIS — I2489 Other forms of acute ischemic heart disease: Secondary | ICD-10-CM | POA: Diagnosis present

## 2022-01-28 DIAGNOSIS — Z888 Allergy status to other drugs, medicaments and biological substances status: Secondary | ICD-10-CM

## 2022-01-28 DIAGNOSIS — B9620 Unspecified Escherichia coli [E. coli] as the cause of diseases classified elsewhere: Secondary | ICD-10-CM | POA: Diagnosis present

## 2022-01-28 DIAGNOSIS — M069 Rheumatoid arthritis, unspecified: Secondary | ICD-10-CM | POA: Diagnosis present

## 2022-01-28 DIAGNOSIS — Z79899 Other long term (current) drug therapy: Secondary | ICD-10-CM

## 2022-01-28 DIAGNOSIS — Z1152 Encounter for screening for COVID-19: Secondary | ICD-10-CM

## 2022-01-28 DIAGNOSIS — R531 Weakness: Secondary | ICD-10-CM

## 2022-01-28 DIAGNOSIS — N3000 Acute cystitis without hematuria: Secondary | ICD-10-CM | POA: Diagnosis not present

## 2022-01-28 DIAGNOSIS — Z8719 Personal history of other diseases of the digestive system: Secondary | ICD-10-CM | POA: Diagnosis not present

## 2022-01-28 DIAGNOSIS — I2510 Atherosclerotic heart disease of native coronary artery without angina pectoris: Secondary | ICD-10-CM | POA: Diagnosis present

## 2022-01-28 DIAGNOSIS — Z820 Family history of epilepsy and other diseases of the nervous system: Secondary | ICD-10-CM

## 2022-01-28 DIAGNOSIS — E872 Acidosis, unspecified: Secondary | ICD-10-CM | POA: Diagnosis not present

## 2022-01-28 DIAGNOSIS — Z7984 Long term (current) use of oral hypoglycemic drugs: Secondary | ICD-10-CM

## 2022-01-28 DIAGNOSIS — R1084 Generalized abdominal pain: Secondary | ICD-10-CM | POA: Diagnosis not present

## 2022-01-28 DIAGNOSIS — E7800 Pure hypercholesterolemia, unspecified: Secondary | ICD-10-CM | POA: Diagnosis not present

## 2022-01-28 DIAGNOSIS — E1122 Type 2 diabetes mellitus with diabetic chronic kidney disease: Secondary | ICD-10-CM | POA: Diagnosis present

## 2022-01-28 DIAGNOSIS — A419 Sepsis, unspecified organism: Secondary | ICD-10-CM | POA: Diagnosis not present

## 2022-01-28 DIAGNOSIS — I252 Old myocardial infarction: Secondary | ICD-10-CM

## 2022-01-28 DIAGNOSIS — Z6828 Body mass index (BMI) 28.0-28.9, adult: Secondary | ICD-10-CM

## 2022-01-28 DIAGNOSIS — E861 Hypovolemia: Secondary | ICD-10-CM | POA: Diagnosis present

## 2022-01-28 DIAGNOSIS — F418 Other specified anxiety disorders: Secondary | ICD-10-CM | POA: Diagnosis present

## 2022-01-28 DIAGNOSIS — K219 Gastro-esophageal reflux disease without esophagitis: Secondary | ICD-10-CM | POA: Diagnosis present

## 2022-01-28 DIAGNOSIS — F32A Depression, unspecified: Secondary | ICD-10-CM | POA: Diagnosis present

## 2022-01-28 DIAGNOSIS — I129 Hypertensive chronic kidney disease with stage 1 through stage 4 chronic kidney disease, or unspecified chronic kidney disease: Secondary | ICD-10-CM | POA: Diagnosis not present

## 2022-01-28 DIAGNOSIS — R7989 Other specified abnormal findings of blood chemistry: Secondary | ICD-10-CM | POA: Diagnosis present

## 2022-01-28 DIAGNOSIS — N281 Cyst of kidney, acquired: Secondary | ICD-10-CM | POA: Diagnosis not present

## 2022-01-28 DIAGNOSIS — K529 Noninfective gastroenteritis and colitis, unspecified: Secondary | ICD-10-CM | POA: Diagnosis present

## 2022-01-28 DIAGNOSIS — K560 Paralytic ileus: Secondary | ICD-10-CM | POA: Diagnosis present

## 2022-01-28 DIAGNOSIS — K6389 Other specified diseases of intestine: Secondary | ICD-10-CM | POA: Diagnosis not present

## 2022-01-28 DIAGNOSIS — E876 Hypokalemia: Secondary | ICD-10-CM | POA: Diagnosis not present

## 2022-01-28 DIAGNOSIS — R001 Bradycardia, unspecified: Secondary | ICD-10-CM | POA: Diagnosis not present

## 2022-01-28 DIAGNOSIS — Z8249 Family history of ischemic heart disease and other diseases of the circulatory system: Secondary | ICD-10-CM

## 2022-01-28 DIAGNOSIS — K760 Fatty (change of) liver, not elsewhere classified: Secondary | ICD-10-CM | POA: Diagnosis not present

## 2022-01-28 DIAGNOSIS — E43 Unspecified severe protein-calorie malnutrition: Secondary | ICD-10-CM | POA: Diagnosis present

## 2022-01-28 DIAGNOSIS — Z87891 Personal history of nicotine dependence: Secondary | ICD-10-CM

## 2022-01-28 DIAGNOSIS — R197 Diarrhea, unspecified: Secondary | ICD-10-CM | POA: Diagnosis present

## 2022-01-28 DIAGNOSIS — N179 Acute kidney failure, unspecified: Secondary | ICD-10-CM | POA: Diagnosis not present

## 2022-01-28 DIAGNOSIS — R918 Other nonspecific abnormal finding of lung field: Secondary | ICD-10-CM | POA: Diagnosis not present

## 2022-01-28 DIAGNOSIS — G894 Chronic pain syndrome: Secondary | ICD-10-CM | POA: Diagnosis present

## 2022-01-28 DIAGNOSIS — Z801 Family history of malignant neoplasm of trachea, bronchus and lung: Secondary | ICD-10-CM

## 2022-01-28 DIAGNOSIS — K7689 Other specified diseases of liver: Secondary | ICD-10-CM | POA: Diagnosis not present

## 2022-01-28 DIAGNOSIS — E875 Hyperkalemia: Secondary | ICD-10-CM

## 2022-01-28 DIAGNOSIS — R0902 Hypoxemia: Secondary | ICD-10-CM | POA: Diagnosis not present

## 2022-01-28 DIAGNOSIS — Z9102 Food additives allergy status: Secondary | ICD-10-CM

## 2022-01-28 DIAGNOSIS — Z7982 Long term (current) use of aspirin: Secondary | ICD-10-CM

## 2022-01-28 DIAGNOSIS — J029 Acute pharyngitis, unspecified: Secondary | ICD-10-CM | POA: Diagnosis present

## 2022-01-28 DIAGNOSIS — I10 Essential (primary) hypertension: Secondary | ICD-10-CM | POA: Diagnosis present

## 2022-01-28 HISTORY — DX: Hyperkalemia: E87.5

## 2022-01-28 HISTORY — DX: Severe sepsis without septic shock: R65.20

## 2022-01-28 LAB — BASIC METABOLIC PANEL
Anion gap: 15 (ref 5–15)
BUN: 81 mg/dL — ABNORMAL HIGH (ref 8–23)
CO2: 8 mmol/L — ABNORMAL LOW (ref 22–32)
Calcium: 7.9 mg/dL — ABNORMAL LOW (ref 8.9–10.3)
Chloride: 110 mmol/L (ref 98–111)
Creatinine, Ser: 3.56 mg/dL — ABNORMAL HIGH (ref 0.44–1.00)
GFR, Estimated: 14 mL/min — ABNORMAL LOW (ref >60)
Glucose, Bld: 134 mg/dL — ABNORMAL HIGH (ref 70–99)
Potassium: 6.5 mmol/L (ref 3.5–5.1)
Sodium: 133 mmol/L — ABNORMAL LOW (ref 135–145)

## 2022-01-28 LAB — COMPREHENSIVE METABOLIC PANEL
ALT: 9 U/L (ref 0–44)
AST: 10 U/L — ABNORMAL LOW (ref 15–41)
Albumin: 3.3 g/dL — ABNORMAL LOW (ref 3.5–5.0)
Alkaline Phosphatase: 148 U/L — ABNORMAL HIGH (ref 38–126)
Anion gap: 16 — ABNORMAL HIGH (ref 5–15)
BUN: 85 mg/dL — ABNORMAL HIGH (ref 8–23)
CO2: 8 mmol/L — ABNORMAL LOW (ref 22–32)
Calcium: 8 mg/dL — ABNORMAL LOW (ref 8.9–10.3)
Chloride: 105 mmol/L (ref 98–111)
Creatinine, Ser: 3.74 mg/dL — ABNORMAL HIGH (ref 0.44–1.00)
GFR, Estimated: 13 mL/min — ABNORMAL LOW (ref >60)
Glucose, Bld: 202 mg/dL — ABNORMAL HIGH (ref 70–99)
Potassium: >7.5 mmol/L (ref 3.5–5.1)
Sodium: 129 mmol/L — ABNORMAL LOW (ref 135–145)
Total Bilirubin: 0.7 mg/dL (ref 0.3–1.2)
Total Protein: 7.9 g/dL (ref 6.5–8.1)

## 2022-01-28 LAB — TROPONIN I (HIGH SENSITIVITY)
Troponin I (High Sensitivity): 22 ng/L — ABNORMAL HIGH (ref <18)
Troponin I (High Sensitivity): 26 ng/L — ABNORMAL HIGH (ref <18)

## 2022-01-28 LAB — PROTIME-INR
INR: 1.5 — ABNORMAL HIGH (ref 0.8–1.2)
Prothrombin Time: 17.9 seconds — ABNORMAL HIGH (ref 11.4–15.2)

## 2022-01-28 LAB — CBC WITH DIFFERENTIAL/PLATELET
Abs Immature Granulocytes: 0.35 10*3/uL — ABNORMAL HIGH (ref 0.00–0.07)
Basophils Absolute: 0.2 10*3/uL — ABNORMAL HIGH (ref 0.0–0.1)
Basophils Relative: 1 %
Eosinophils Absolute: 0 10*3/uL (ref 0.0–0.5)
Eosinophils Relative: 0 %
HCT: 43 % (ref 36.0–46.0)
Hemoglobin: 12.8 g/dL (ref 12.0–15.0)
Immature Granulocytes: 1 %
Lymphocytes Relative: 3 %
Lymphs Abs: 0.7 10*3/uL (ref 0.7–4.0)
MCH: 26.2 pg (ref 26.0–34.0)
MCHC: 29.8 g/dL — ABNORMAL LOW (ref 30.0–36.0)
MCV: 88.1 fL (ref 80.0–100.0)
Monocytes Absolute: 3.1 10*3/uL — ABNORMAL HIGH (ref 0.1–1.0)
Monocytes Relative: 11 %
Neutro Abs: 23.5 10*3/uL — ABNORMAL HIGH (ref 1.7–7.7)
Neutrophils Relative %: 84 %
Platelets: 560 10*3/uL — ABNORMAL HIGH (ref 150–400)
RBC: 4.88 MIL/uL (ref 3.87–5.11)
RDW: 17 % — ABNORMAL HIGH (ref 11.5–15.5)
Smear Review: INCREASED
WBC: 27.8 10*3/uL — ABNORMAL HIGH (ref 4.0–10.5)
nRBC: 0 % (ref 0.0–0.2)

## 2022-01-28 LAB — RESP PANEL BY RT-PCR (RSV, FLU A&B, COVID)  RVPGX2
Influenza A by PCR: NEGATIVE
Influenza B by PCR: NEGATIVE
Resp Syncytial Virus by PCR: NEGATIVE
SARS Coronavirus 2 by RT PCR: NEGATIVE

## 2022-01-28 LAB — LACTIC ACID, PLASMA: Lactic Acid, Venous: 1.1 mmol/L (ref 0.5–1.9)

## 2022-01-28 LAB — CREATININE, SERUM
Creatinine, Ser: 3.08 mg/dL — ABNORMAL HIGH (ref 0.44–1.00)
GFR, Estimated: 16 mL/min — ABNORMAL LOW (ref >60)

## 2022-01-28 LAB — CBG MONITORING, ED: Glucose-Capillary: 160 mg/dL — ABNORMAL HIGH (ref 70–99)

## 2022-01-28 LAB — APTT: aPTT: 30 seconds (ref 24–36)

## 2022-01-28 LAB — LIPASE, BLOOD: Lipase: 28 U/L (ref 11–51)

## 2022-01-28 LAB — POTASSIUM: Potassium: 5.6 mmol/L — ABNORMAL HIGH (ref 3.5–5.1)

## 2022-01-28 MED ORDER — HYDRALAZINE HCL 20 MG/ML IJ SOLN: 5.0000 mg | Freq: Three times a day (TID) | INTRAMUSCULAR | Status: DC | PRN

## 2022-01-28 MED ORDER — FENTANYL CITRATE PF 50 MCG/ML IJ SOSY
25.0000 ug | PREFILLED_SYRINGE | INTRAMUSCULAR | Status: AC | PRN
  Administered 2022-01-28 – 2022-01-30 (×3): 25 ug via INTRAVENOUS
  Filled 2022-01-28 (×3): qty 1

## 2022-01-28 MED ORDER — MORPHINE SULFATE (PF) 4 MG/ML IV SOLN
4.0000 mg | Freq: Once | INTRAVENOUS | Status: AC
  Administered 2022-01-28: 4 mg via INTRAVENOUS
  Filled 2022-01-28: qty 1

## 2022-01-28 MED ORDER — ACETAMINOPHEN 650 MG RE SUPP: 650.0000 mg | Freq: Four times a day (QID) | RECTAL | Status: DC | PRN

## 2022-01-28 MED ORDER — SENNOSIDES-DOCUSATE SODIUM 8.6-50 MG PO TABS: 1.0000 | ORAL_TABLET | Freq: Every evening | ORAL | Status: DC | PRN

## 2022-01-28 MED ORDER — MENTHOL 3 MG MT LOZG: 1.0000 | LOZENGE | OROMUCOSAL | Status: DC | PRN

## 2022-01-28 MED ORDER — SODIUM ZIRCONIUM CYCLOSILICATE 10 G PO PACK
10.0000 g | PACK | Freq: Once | ORAL | Status: AC
  Administered 2022-01-28: 10 g via ORAL
  Filled 2022-01-28: qty 1

## 2022-01-28 MED ORDER — SODIUM CHLORIDE 0.9 % IV BOLUS
1000.0000 mL | Freq: Once | INTRAVENOUS | Status: AC
  Administered 2022-01-28: 1000 mL via INTRAVENOUS

## 2022-01-28 MED ORDER — HEPARIN SODIUM (PORCINE) 5000 UNIT/ML IJ SOLN
5000.0000 [IU] | Freq: Three times a day (TID) | INTRAMUSCULAR | Status: DC
  Administered 2022-01-29 – 2022-01-31 (×7): 5000 [IU] via SUBCUTANEOUS
  Filled 2022-01-28 (×7): qty 1

## 2022-01-28 MED ORDER — FENTANYL CITRATE PF 50 MCG/ML IJ SOSY
50.0000 ug | PREFILLED_SYRINGE | Freq: Once | INTRAMUSCULAR | Status: AC
  Administered 2022-01-28: 50 ug via INTRAVENOUS
  Filled 2022-01-28: qty 1

## 2022-01-28 MED ORDER — INSULIN ASPART 100 UNIT/ML IJ SOLN: 0.0000 [IU] | Freq: Every day | INTRAMUSCULAR | Status: DC

## 2022-01-28 MED ORDER — ONDANSETRON HCL 4 MG/2ML IJ SOLN: 4.0000 mg | Freq: Four times a day (QID) | INTRAMUSCULAR | Status: AC | PRN

## 2022-01-28 MED ORDER — FLUOXETINE HCL 10 MG PO CAPS
20.0000 mg | ORAL_CAPSULE | Freq: Every morning | ORAL | Status: DC
  Administered 2022-01-29 – 2022-02-05 (×8): 20 mg via ORAL
  Filled 2022-01-28: qty 2
  Filled 2022-01-28 (×3): qty 1
  Filled 2022-01-28 (×2): qty 2
  Filled 2022-01-28 (×3): qty 1

## 2022-01-28 MED ORDER — NYSTATIN 100000 UNIT/ML MT SUSP
5.0000 mL | Freq: Four times a day (QID) | OROMUCOSAL | Status: AC
  Administered 2022-01-28 – 2022-01-29 (×4): 500000 [IU] via ORAL
  Filled 2022-01-28 (×4): qty 5

## 2022-01-28 MED ORDER — MORPHINE SULFATE (PF) 2 MG/ML IV SOLN
2.0000 mg | INTRAVENOUS | Status: AC | PRN
  Administered 2022-01-29 – 2022-01-30 (×3): 2 mg via INTRAVENOUS
  Filled 2022-01-28 (×4): qty 1

## 2022-01-28 MED ORDER — ALBUTEROL SULFATE (2.5 MG/3ML) 0.083% IN NEBU
10.0000 mg | INHALATION_SOLUTION | Freq: Once | RESPIRATORY_TRACT | Status: AC
  Administered 2022-01-28: 10 mg via RESPIRATORY_TRACT
  Filled 2022-01-28: qty 12

## 2022-01-28 MED ORDER — DEXTROSE 50 % IV SOLN
1.0000 | Freq: Once | INTRAVENOUS | Status: AC
  Administered 2022-01-29: 50 mL via INTRAVENOUS
  Filled 2022-01-28: qty 50

## 2022-01-28 MED ORDER — SODIUM BICARBONATE 8.4 % IV SOLN
Freq: Once | INTRAVENOUS | Status: AC
  Filled 2022-01-28: qty 1000

## 2022-01-28 MED ORDER — ALPRAZOLAM 0.25 MG PO TABS: 0.2500 mg | ORAL_TABLET | Freq: Two times a day (BID) | ORAL | Status: DC | PRN

## 2022-01-28 MED ORDER — SODIUM CHLORIDE 0.9 % IV SOLN
2.0000 g | Freq: Once | INTRAVENOUS | Status: AC
  Administered 2022-01-28: 2 g via INTRAVENOUS
  Filled 2022-01-28: qty 12.5

## 2022-01-28 MED ORDER — SODIUM BICARBONATE 8.4 % IV SOLN
100.0000 meq | Freq: Once | INTRAVENOUS | Status: AC
  Administered 2022-01-28: 100 meq via INTRAVENOUS
  Filled 2022-01-28: qty 100

## 2022-01-28 MED ORDER — METRONIDAZOLE 500 MG/100ML IV SOLN
500.0000 mg | Freq: Once | INTRAVENOUS | Status: AC
  Administered 2022-01-28: 500 mg via INTRAVENOUS
  Filled 2022-01-28: qty 100

## 2022-01-28 MED ORDER — CALCIUM GLUCONATE 10 % IV SOLN
1.0000 g | Freq: Once | INTRAVENOUS | Status: AC
  Administered 2022-01-28: 1 g via INTRAVENOUS
  Filled 2022-01-28: qty 10

## 2022-01-28 MED ORDER — INSULIN ASPART 100 UNIT/ML IV SOLN
5.0000 [IU] | Freq: Once | INTRAVENOUS | Status: AC
  Administered 2022-01-29: 5 [IU] via INTRAVENOUS
  Filled 2022-01-28: qty 0.05

## 2022-01-28 MED ORDER — INSULIN ASPART 100 UNIT/ML IV SOLN
10.0000 [IU] | Freq: Once | INTRAVENOUS | Status: AC
  Administered 2022-01-28: 10 [IU] via INTRAVENOUS
  Filled 2022-01-28: qty 0.1

## 2022-01-28 MED ORDER — ONDANSETRON HCL 4 MG PO TABS: 4.0000 mg | ORAL_TABLET | Freq: Four times a day (QID) | ORAL | Status: AC | PRN

## 2022-01-28 MED ORDER — SODIUM CHLORIDE 0.9 % IV SOLN: INTRAVENOUS | Status: DC

## 2022-01-28 MED ORDER — ACETAMINOPHEN 325 MG PO TABS
650.0000 mg | ORAL_TABLET | Freq: Four times a day (QID) | ORAL | Status: DC | PRN
  Administered 2022-01-30 (×2): 650 mg via ORAL
  Filled 2022-01-28 (×2): qty 2

## 2022-01-28 MED ORDER — BUTALBITAL-APAP-CAFFEINE 50-325-40 MG PO TABS: 1.0000 | ORAL_TABLET | Freq: Four times a day (QID) | ORAL | Status: DC | PRN

## 2022-01-28 MED ORDER — SODIUM CHLORIDE 0.9 % IV SOLN: INTRAVENOUS | Status: AC

## 2022-01-28 MED ORDER — INSULIN ASPART 100 UNIT/ML IJ SOLN
0.0000 [IU] | Freq: Three times a day (TID) | INTRAMUSCULAR | Status: DC
  Administered 2022-01-29: 2 [IU] via SUBCUTANEOUS
  Administered 2022-01-30: 1 [IU] via SUBCUTANEOUS
  Administered 2022-01-30: 2 [IU] via SUBCUTANEOUS
  Administered 2022-01-31: 3 [IU] via SUBCUTANEOUS
  Administered 2022-01-31: 1 [IU] via SUBCUTANEOUS
  Administered 2022-02-02 – 2022-02-04 (×5): 2 [IU] via SUBCUTANEOUS
  Administered 2022-02-04 – 2022-02-05 (×3): 1 [IU] via SUBCUTANEOUS
  Filled 2022-01-28 (×13): qty 1

## 2022-01-28 NOTE — H&P (Addendum)
History and Physical   Whitney Bernard EOF:[id number] DOB: 07-06-1960 DOA: 01/28/2022  PCP: Macarthur Critchley, MD  Patient coming from: Home via EMS  I have personally briefly reviewed patient's old medical records in [place].  Chief Concern: Abdominal pain, diarrhea  HPI: Whitney Bernard is a 61 year old female with history of pancreatitis, CKD, CAD with prior history of MI, depression, hyperlipidemia, hypertension, rheumatoid arthritis, COPD, non-insulin-dependent diabetes mellitus type 2, who presents to the ED for chief concerns of abdominal pain and diarrhea.  Initial vitals in the emergency department showed temperature 97.6, respiration rate of 20, heart rate of 104, blood pressure 85/65, responded to IV fluid bolus and current blood pressure is 132/89, SpO2 of 89% and currently 100% on room air.  Initial serum sodium was 129, potassium 7.5, chloride of 105, bicarb of 8, BUN of 85, serum creatinine of 3.74, nonfasting blood glucose 202, alk phos was elevated at 148, EGFR of 13.  Patient was treated with albuterol nebulizer 10 mg one-time dose, calcium gluconate 1 g IV, insulin aspart 10 units IV, one-time dose of Lokelma.  Patient was also given sodium chloride 2 L bolus.  On recheck patient's serum sodium is 133, potassium improved to 6.5, chloride of 110, bicarb of 8, BUN of 81, serum creatinine of 3.56, and blood glucose level was 134, EGFR improved to 14.  EDP discussed case with nephrologist who recommends 1 more dose of Lokelma, start patient on sodium bicarb and dextrose infusion.  Patient also received metronidazole IV, cefepime per pharmacy, morphine 4 mg IV, fentanyl 50 mcg IV. --------------------------- At bedside she was able to tell me her name, age, the current calendar year and the current location.  She reports that she has been having diarrhea, too many times to count since Saturday, 01/25/2022.  She reports a recent family friend was recently at her home and was  sick with a respiratory illness.  She endorses weakness and near syncope.  She has had poor p.o. intake over the weekend.  She denies fever, nausea, vomiting, chest pain, swelling of her lower extremity, dysuria, hematuria, syncope. She denies blood in her stool and recent personal antibiotic use.  She denies recent hospitalization and/or recent exposure to nursing home.  She endorses compliance with her home medication including her blood pressure medication and her home potassium supplementation.  She thought that she would be low on her potassium level and therefore has been taking it as prescribed.  Social history: She lives at home. She is a former tobacco user and quit many years ago. She denies EtOH and recreational drug use.  ROS: Constitutional: no weight change, no fever ENT/Mouth: no sore throat, no rhinorrhea Eyes: no eye pain, no vision changes Cardiovascular: no chest pain, no dyspnea,  no edema, no palpitations Respiratory: no cough, no sputum, no wheezing Gastrointestinal: no nausea, no vomiting, + diarrhea, no constipation Genitourinary: no urinary incontinence, no dysuria, no hematuria Musculoskeletal: no arthralgias, no myalgias Skin: no skin lesions, no pruritus, Neuro: + weakness, no loss of consciousness, no syncope, + near syncope Psych: no anxiety, no depression, + decrease appetite Heme/Lymph: no bruising, no bleeding  ED Course: Discussed with emergency medicine provider, patient requiring hospitalization for chief concerns of hyperkalemia and acute kidney injury.  Assessment/Plan  Principal Problem:   Hyperkalemia Active Problems:   AKI (acute kidney injury) ([place])   SIRS (systemic inflammatory response syndrome) (HCC)   Diarrhea   Depression with anxiety   COPD (chronic obstructive  pulmonary disease) ([place])   GERD (gastroesophageal reflux disease)   Generalized weakness   Hypertension   Type II diabetes mellitus with renal manifestations (HCC)    Protein-calorie malnutrition, severe   Elevated troponin   Sore throat   Assessment and Plan:  * Hyperkalemia - Presumed secondary to acute kidney injury secondary to prerenal in setting of GI loss and continued potassium supplementation use - Scheduled recheck of potassium level at 2200 - Continue bicarb gtt.in dextrose at 100 mL/h - Continue telemetry cardiac monitoring  SIRS (systemic inflammatory response syndrome) (HCC) - Patient meets SIRS criteria with increased heart rate, marked leukocytosis, organ involvement is renal and respiratory - Lactic acid was 1.1 in the emergency department - Source of SIRS/sepsis workup in progress - Will check C. difficile by PCR, GI panel - Blood cultures x 2 are in process, urine cultures in process  AKI (acute kidney injury) ([place]) - Differential diagnosis include prerenal versus intrarenal - Nephrology has been consulted by EDP, we appreciate further recommendations from nephrology service - On CKD 3A - In setting of diarrhea and markedly elevated leukocytosis, we will need to check stat C. difficile and GI panel - C. difficile testing and GI panel ordered - No antidiarrheal medication until the above has resulted negative - Status post sodium chloride 2 L bolus - Continue bicarb gtt.in dextrose at 100 mL/h - Check BMP in the a.m.  Diarrhea With marked leukocytosis and acute kidney injury - C. difficile screening and GI panel have been ordered, discussed with nursing  COPD (chronic obstructive pulmonary disease) ([place]) - Patient does not appear to be in acute exacerbation at my evaluation  Depression with anxiety - Fluoxetine 20 mg every morning resumed - Alprazolam 0.25 mg p.o. twice daily as needed for anxiety ordered  Hypertension - Holding home antihypertensive medication, Coreg 6.25 mg p.o. twice daily, lisinopril 20 mg daily, furosemide 40 mg p.o. twice daily, spironolactone 25 mg daily due to hypotension, acute kidney injury,  hyperkalemia -AM team to resume home antihypertensive medication as appropriate - Hydralazine 5 mg IV every 8 hours as needed for hypertension SBP greater than 180, 5 days ordered  Type II diabetes mellitus with renal manifestations ([place]) - Non-insulin-dependent - Holding home metformin due to acute kidney injury - Insulin SSI with HS coverage ordered, renal dosing  Sore throat With dry mucosa - Pharynx difficult to assessment, class III/IV -Possible mild thrush - Nystatin suspension 4 times daily, 1 day ordered - Cepacol (lozenges) as needed for sore throat ordered  Elevated troponin - I suspect this is demand ischemia in setting of acute kidney injury secondary to GI loss - Continue monitor on telemetry  Protein-calorie malnutrition, severe - Registered dietitian has been consulted  Chart reviewed.   DVT prophylaxis: Heparin Code Status: full code Diet: heart healthy, as tolerated Family Communication: no Disposition Plan: guarded prognosis Consults called: none at this time Admission status: Inpatient, telemetry cardiac  Past Medical History:  Diagnosis Date   Acute pancreatitis    CAD (coronary artery disease)    a. 08/2017 Neg MV; b. 01/2019 CTA Chest: Mild cor Ca2+; c. 02/2019 NSTEMI/PCI: LCX 95p/m (2.75x15 Resolute Onyx DES); d. 05/2019 Cath: LM nl, LAD nl, LCX patent stent, RCA 30ost/p-->Med rx.   Chronic pain syndrome    CKD (chronic kidney disease), stage III (HCC)    COPD (chronic obstructive pulmonary disease) (HCC)    Depression    Diabetes mellitus without complication (HCC)    ZJQBHALP(379.0)    migraines  History of echocardiogram    a. 04/2018 Echo: EF 55-60%. No significant valvular dzs; b. 02/2019 Echo: EF 60-65%. Nl RV fxn.   Hypercholesteremia    Hypertension    Hyperthyroidism    Hypokalemia    Migraine    Narcotic abuse ([place])    RA (rheumatoid arthritis) ([place])    Past Surgical History:  Procedure Laterality Date   ANTERIOR CERVICAL  DECOMP/DISCECTOMY FUSION N/A 11/26/2012   Procedure: ANTERIOR CERVICAL DECOMPRESSION/DISCECTOMY FUSION 2 LEVELS;  Surgeon: Faythe Ghee, MD;  Location: [facility];  Service: Neurosurgery;  Laterality: N/A;  ANTERIOR CERVICAL DECOMPRESSION/DISCECTOMY FUSION 2 LEVELS   CERVICAL POLYPECTOMY     CHOLECYSTECTOMY     CORONARY STENT INTERVENTION N/A 02/23/2019   Procedure: CORONARY STENT INTERVENTION;  Surgeon: Nelva Bush, MD;  Location: [facility];  Service: Cardiovascular;  Laterality: N/A;   ESOPHAGOGASTRODUODENOSCOPY N/A 12/26/2014   Procedure: ESOPHAGOGASTRODUODENOSCOPY (EGD);  Surgeon: Lucilla Lame, MD;  Location: [facility] END[facility];  Service: Endoscopy;  Laterality: N/A;   FRACTURE SURGERY     LEFT HEART CATH AND CORONARY ANGIOGRAPHY N/A 02/23/2019   Procedure: LEFT HEART CATH AND CORONARY ANGIOGRAPHY;  Surgeon: Minna Merritts, MD;  Location: [facility];  Service: Cardiovascular;  Laterality: N/A;   LEFT HEART CATH AND CORONARY ANGIOGRAPHY N/A 06/06/2019   Procedure: LEFT HEART CATH AND CORONARY ANGIOGRAPHY;  Surgeon: Wellington Hampshire, MD;  Location: [facility];  Service: Cardiovascular;  Laterality: N/A;   NECK SURGERY     ORIF WRIST FRACTURE Right 03/28/2020   Procedure: OPEN REDUCTION INTERNAL FIXATION OF RIGHT DISTAL RADIUS FRACTURE;  Surgeon: Corky Mull, MD;  Location: [facility] ORS;  Service: Orthopedics;  Laterality: Right;   TUBAL LIGATION     WISDOM TOOTH EXTRACTION     Social History:  reports that she quit smoking about 9 years ago. Her smoking use included cigarettes. She has a 19.00 pack-year smoking history. She has never used smokeless tobacco. She reports that she does not currently use alcohol. She reports that she does not use drugs.  Allergies  Allergen Reactions   Almond Oil Hives   Atorvastatin Other (See Comments)    myalgias   Rosuvastatin Other (See Comments)    Muscle pain   Family History  Problem Relation Age of Onset   CAD  Father    Alzheimer's disease Other    Breast cancer Paternal Aunt        another aunt- Polycythemia vera.    Breast cancer Cousin    Lung cancer Paternal Uncle    Family history: Family history reviewed and not pertinent  Prior to Admission medications   Medication Sig Start Date End Date Taking? Authorizing Provider  ALPRAZolam Duanne Moron) 0.25 MG tablet take 1 tablet by oral route 2 times every day 07/17/21     ALPRAZolam (XANAX) 0.25 MG tablet Take 1 tablet by mouth 2 times every day 11/14/21     ALPRAZolam (XANAX) 0.5 MG tablet take 1 tablet by oral route 2 times every day 06/27/21     ARIPiprazole (ABILIFY) 2 MG tablet Take 0.5 tablets (1 mg total) by mouth daily for 2 weeks, then increase to 1 tablet daily 01/16/22     aspirin EC 81 MG EC tablet Take 1 tablet (81 mg total) by mouth daily. 06/07/19   Jennye Boroughs, MD  busPIRone (BUSPAR) 10 MG tablet take 1 tablet by oral route 3 times every day 11/08/20     butalbital-acetaminophen-caffeine (FIORICET) 50-325-40 MG  tablet take 1 - 2 tablet by oral route  every 4 hours as needed not to exceed 6 tablets per 24hrs 09/10/21     butalbital-acetaminophen-caffeine (FIORICET) 50-325-40 MG tablet Take 1 tablet by mouth every 8 hours as needed 12/19/21     carvedilol (COREG) 6.25 MG tablet Take 1 tablet (6.25 mg total) by mouth 2 (two) times daily. 06/19/21 07/20/21  Richarda Osmond, MD  carvedilol (COREG) 6.25 MG tablet Take 1 tablet (6.25 mg total) by mouth 2 (two) times daily. 09/18/21     FLUoxetine (PROZAC) 20 MG capsule Take 1 capsule by mouth every day in the morning 09/18/21     furosemide (LASIX) 40 MG tablet TAKE 1 TABLET BY MOUTH TWICE DAILY 05/11/21     ibuprofen (ADVIL) 200 MG tablet Take 600 mg by mouth every 4 (four) hours as needed for mild pain or moderate pain.    [provider]  lisinopril (ZESTRIL) 20 MG tablet take 1 tablet by oral route  every day 07/09/20     metFORMIN (GLUCOPHAGE) 500 MG tablet take 1 tablet by mouth every day  with a meal 07/05/20     Multiple Vitamins-Minerals (MULTIVITAMIN WITH MINERALS) tablet Take 1 tablet by mouth daily.    [provider]  potassium chloride SA (KLOR-CON M) 20 MEQ tablet Take by mouth. 06/28/21     potassium chloride SA (KLOR-CON M20) 20 MEQ tablet take 1 tablet by mouth 2 times a day with food 07/01/21     potassium chloride SA (KLOR-CON) 20 MEQ tablet Take 1 tablet (20 mEq total) by mouth 2 (two) times daily. 06/07/19   Jennye Boroughs, MD  spironolactone (ALDACTONE) 25 MG tablet Take 0.5 tablets (12.5 mg total) by mouth daily. 06/20/21 07/20/21  Richarda Osmond, MD   Physical Exam: Vitals:   01/28/22 1739 01/28/22 1741 01/28/22 1900 01/28/22 1944  BP:   132/89 104/67  Pulse: (!) 117 (!) 114 89 (!) 124  Resp: _0 Temp:    98 F (36.7 C)  TempSrc:    Oral  SpO2: 100% 97% 94% 94%  Weight:      Height:       Constitutional: appears age-appropriate, NAD, calm, comfortable Eyes: PERRL, lids and conjunctivae normal ENMT: Mucous membranes are dry. Posterior pharynx clear of any exudate or lesions. Age-appropriate dentition. Hearing appropriate Neck: normal, supple, no masses, no thyromegaly Respiratory: clear to auscultation bilaterally, no wheezing, no crackles. Normal respiratory effort. No accessory muscle use.  Cardiovascular: Regular rate and rhythm, no murmurs / rubs / gallops. No extremity edema. 2+ pedal pulses. No carotid bruits.  Abdomen: Obese abdomen, no tenderness, no masses palpated, no hepatosplenomegaly. Bowel sounds positive.  Musculoskeletal: no clubbing / cyanosis. No joint deformity upper and lower extremities. Good ROM, no contractures, no atrophy. Normal muscle tone.  Skin: no rashes, lesions, ulcers. No induration Neurologic: Sensation intact. Strength 5/5 in all 4.  Psychiatric: Normal judgment and insight. Alert and oriented x 3. Normal mood.   EKG: independently reviewed, showing sinus tachycardia with rate of 117, QTc 448  Chest  x-ray on Admission: I personally reviewed and I agree with radiologist reading as below.  CT CHEST ABDOMEN PELVIS WO CONTRAST  Result Date: 01/28/2022 CLINICAL DATA:  Abdominal pain and distension with diarrhea the last 4 days. EXAM: CT CHEST, ABDOMEN AND PELVIS WITHOUT CONTRAST TECHNIQUE: Multidetector CT imaging of the chest, abdomen and pelvis was performed following the standard protocol without IV contrast. RADIATION DOSE REDUCTION:  This exam was performed according to the departmental dose-optimization program which includes automated exposure control, adjustment of the mA and/or kV according to patient size and/or use of iterative reconstruction technique. COMPARISON:  06/20/2021 and 06/15/2021 FINDINGS: CT CHEST FINDINGS Cardiovascular: Circumflex coronary artery atherosclerosis. Mild atherosclerotic calcification of the descending thoracic aorta. Mild cardiomegaly. Mediastinum/Nodes: Right upper paratracheal node 0.7 cm in short axis on image 10 series 2, within normal limits. No pathologic adenopathy observed. Lungs/Pleura: Mild dependent atelectasis or scarring posteriorly in both upper lobes. Mild atelectasis in both lower lobes and in the lingula. Moderate atelectasis in the right middle lobe. Musculoskeletal: Lower cervical plate and screw fixator. Degenerative arthropathy of the glenohumeral joints. Mild thoracic spondylosis. CT ABDOMEN PELVIS FINDINGS Hepatobiliary: Cholecystectomy. Chronic calcification posteriorly in the right hepatic lobe, likely postinflammatory. Diffuse hepatic steatosis. Pancreas: Unremarkable Spleen: Unremarkable Adrenals/Urinary Tract: Mild fullness of both adrenal glands without a discrete mass. Renal contours unremarkable, no urinary tract calculi observed. Urinary bladder appears normal. Stomach/Bowel: Sigmoid colon diverticulosis. Relatively empty distal large bowel with an air-fluid level at the splenic flexure noted, no well-defined formed stool in the colon.  Similar to the 06/15/2021 exam there is a short tubular structure extending from the vicinity of the cecum presumably representing coiled appendix, this is of normal caliber proximally, but the distal portion is difficult to assess due to the coiling, but not substantially different from 06/15/2021. No dilated small bowel to indicate small bowel obstruction. Vascular/Lymphatic: Atherosclerosis is present, including aortoiliac atherosclerotic disease. No pathologic adenopathy. Reproductive: Unremarkable Other: No supplemental non-categorized findings. Musculoskeletal: Unremarkable IMPRESSION: 1. Air fluid level in the splenic flexure the colon with relatively empty distal colon, cannot exclude diarrheal process. 2. Sigmoid colon diverticulosis without active diverticulitis. 3. Scattered mostly subsegmental atelectasis in the lungs. 4. Mild cardiomegaly. 5. Diffuse hepatic steatosis. 6. A tubular structure extending from the vicinity of the cecum presumably represents the coiled appendix, this is of normal caliber proximally, but the distal portion is difficult to assess due to the coiling, but this is not substantially different from 06/15/2021. 7. Aortic and circumflex coronary artery atherosclerosis. Aortic Atherosclerosis (ICD10-I70.0). Electronically Signed   By: Van Clines M.D.   On: 01/28/2022 15:52   DG Chest Port 1 View  Result Date: 01/28/2022 CLINICAL DATA:  Questionable sepsis - evaluate for abnormality EXAM: PORTABLE CHEST 1 VIEW COMPARISON:  Chest x-ray 06/19/2021, 01/28/2022 FINDINGS: The heart and mediastinal contours are unchanged. Lingular linear atelectasis versus scarring. Right streaky airspace opacities consistent with atelectasis and elevated right hemidiaphragm. No focal consolidation. No pulmonary edema. No pleural effusion. No pneumothorax. No acute osseous abnormality.  Cervical spine surgical hardware. IMPRESSION: No active disease. Electronically Signed   By: Iven Finn  M.D.   On: 01/28/2022 15:48    Labs on Admission: I have personally reviewed following labs  CBC: Recent Labs  Lab 01/28/22 1437  WBC 27.8*  NEUTROABS 23.5*  HGB 12.8  HCT 43.0  MCV 88.1  PLT 678*   Basic Metabolic Panel: Recent Labs  Lab 01/28/22 1437 01/28/22 1751  NA 129* 133*  K >7.5* 6.5*  CL 105 110  CO2 8* 8*  GLUCOSE 202* 134*  BUN 85* 81*  CREATININE 3.74* 3.56*  CALCIUM 8.0* 7.9*   GFR: Estimated Creatinine Clearance: 16 mL/min (A) (by C-G formula based on SCr of 3.56 mg/dL (H)).  Liver Function Tests: Recent Labs  Lab 01/28/22 1437  AST 10*  ALT 9  ALKPHOS 148*  BILITOT 0.7  PROT 7.9  ALBUMIN  3.3*   Recent Labs  Lab 01/28/22 1437  LIPASE 28   Coagulation Profile: Recent Labs  Lab 01/28/22 1749  INR 1.5*   Urine analysis:    Component Value Date/Time   COLORURINE YELLOW (A) 06/15/2021 1900   APPEARANCEUR CLEAR (A) 06/15/2021 1900   APPEARANCEUR Clear 12/03/2013 1556   LABSPEC 1.013 06/15/2021 1900   LABSPEC 1.024 12/03/2013 1556   PHURINE 5.0 06/15/2021 1900   GLUCOSEU NEGATIVE 06/15/2021 1900   GLUCOSEU Negative 12/03/2013 1556   HGBUR NEGATIVE 06/15/2021 1900   BILIRUBINUR NEGATIVE 06/15/2021 1900   BILIRUBINUR Negative 12/03/2013 1556   KETONESUR NEGATIVE 06/15/2021 1900   PROTEINUR 100 (A) 06/15/2021 1900   NITRITE NEGATIVE 06/15/2021 1900   LEUKOCYTESUR NEGATIVE 06/15/2021 1900   LEUKOCYTESUR Negative 12/03/2013 1556   CRITICAL CARE Performed by: Dr. Tobie Poet  Total critical care time: 35 minutes  Critical care time was exclusive of separately billable procedures and treating other patients.  Critical care was necessary to treat or prevent imminent or life-threatening deterioration.  Critical care was time spent personally by me on the following activities: development of treatment plan with patient and/or surrogate as well as nursing, discussions with consultants, evaluation of patient's response to treatment, examination  of patient, obtaining history from patient or surrogate, ordering and performing treatments and interventions, ordering and review of laboratory studies, ordering and review of radiographic studies, pulse oximetry and re-evaluation of patient's condition.  This document was prepared using Dragon Voice Recognition software and may include unintentional dictation errors.  Dr. Tobie Poet [facility]  If 7PM-7AM, please contact overnight-coverage provider If 7AM-7PM, please contact day coverage provider [email or web address]  01/28/2022, 9:02 PM

## 2022-01-28 NOTE — Assessment & Plan Note (Addendum)
Non-insulin-dependent. A1c pending. Hold metformin due to AKI Sliding scale Novolog

## 2022-01-28 NOTE — ED Provider Notes (Signed)
[facility] Provider Note    Event Date/Time   First MD Initiated Contact with Patient 01/28/22 1523     (approximate)   History   Abdominal Pain   HPI  Whitney Bernard is a 61 y.o. female who presents to the emergency department with abdominal pain.  Patient states that she started having abdominal pain mild abdominal pain and diarrhea over the past couple of days but states that it significantly worsened this afternoon.  Severe pain to her upper abdomen associate with nausea.  Denies any blood in her stool.  No melena.  History of cholecystectomy.  Denies any significant alcohol use.  Denies any fever or chills.     Physical Exam   Triage Vital Signs: ED Triage Vitals  Enc Vitals Group     BP 01/28/22 1427 95/65     Pulse Rate 01/28/22 1427 (!) 104     Resp 01/28/22 1427 20     Temp 01/28/22 1427 97.6 F (36.4 C)     Temp Source 01/28/22 1427 Oral     SpO2 01/28/22 1427 (!) 89 %     Weight 01/28/22 1440 164 lb (74.4 kg)     Height 01/28/22 1440 5\' 4"  (1.626 m)     Head Circumference --      Peak Flow --      Pain Score --      Pain Loc --      Pain Edu? --      Excl. in GC? --     Most recent vital signs: Vitals:   01/28/22 2130 01/28/22 2145  BP: 136/86   Pulse:    Resp: (!) 26 19  Temp:    SpO2:  91%    Physical Exam  IMPRESSION / MDM / ASSESSMENT AND PLAN / ED COURSE  I reviewed the triage vital signs and the nursing notes.  On arrival to the emergency department patient was tachycardic and hypoxic to 89%.  Repeat blood pressure 85/69.  Given her tachycardia and low blood pressure concern for sepsis.  Complaints of severe abdominal pain so differential includes AAA, dissection  CT noncontrasted scan ordered given the patient had significantly elevated creatinine which is new from her normal kidney function.  Initiated code sepsis.  Blood and urine cultures obtained.  Felt that 30 cc/kg may be detrimental to the patient given her  hypoxia so we will give 1 L of IV fluids and will reevaluate.  Started on IV Zosyn to cover for an intra-abdominal infectious process.  No recent antibiotic use.  EKG  I, 2146, the attending physician, personally viewed and interpreted this ECG.   Rate: 123  Rhythm: Normal sinus  Axis: Normal  Intervals: Right bundle branch block PR 222 QTc 49  ST&T Change: Nonspecific  No tachycardic or bradycardic dysrhythmias while on cardiac telemetry. Findings concerning for hyperkalemia  RADIOLOGY I independently reviewed imaging, my interpretation of imaging: CT scan without contrast of chest abdomen and pelvis without an obvious obstructive process.  No obvious signs of dissection.  No free fluid.  Showed signs of diarrhea.  Labs (all labs ordered are listed, but only abnormal results are displayed) Labs interpreted as -  Significantly elevated creatinine, elevated BUN, potassium elevated above 7.  Leukocytosis of 27  Labs Reviewed  CBC WITH DIFFERENTIAL/PLATELET - Abnormal; Notable for the following components:      Result Value   WBC 27.8 (*)    MCHC 29.8 (*)  RDW 17.0 (*)    Platelets 560 (*)    Neutro Abs 23.5 (*)    Monocytes Absolute 3.1 (*)    Basophils Absolute 0.2 (*)    Abs Immature Granulocytes 0.35 (*)    All other components within normal limits  COMPREHENSIVE METABOLIC PANEL - Abnormal; Notable for the following components:   Sodium 129 (*)    Potassium >7.5 (*)    CO2 8 (*)    Glucose, Bld 202 (*)    BUN 85 (*)    Creatinine, Ser 3.74 (*)    Calcium 8.0 (*)    Albumin 3.3 (*)    AST 10 (*)    Alkaline Phosphatase 148 (*)    GFR, Estimated 13 (*)    Anion gap 16 (*)    All other components within normal limits  PROTIME-INR - Abnormal; Notable for the following components:   Prothrombin Time 17.9 (*)    INR 1.5 (*)    All other components within normal limits  BASIC METABOLIC PANEL - Abnormal; Notable for the following components:   Sodium 133 (*)     Potassium 6.5 (*)    CO2 8 (*)    Glucose, Bld 134 (*)    BUN 81 (*)    Creatinine, Ser 3.56 (*)    Calcium 7.9 (*)    GFR, Estimated 14 (*)    All other components within normal limits  CREATININE, SERUM - Abnormal; Notable for the following components:   Creatinine, Ser 3.08 (*)    GFR, Estimated 16 (*)    All other components within normal limits  POTASSIUM - Abnormal; Notable for the following components:   Potassium 5.6 (*)    All other components within normal limits  CBG MONITORING, ED - Abnormal; Notable for the following components:   Glucose-Capillary 160 (*)    All other components within normal limits  TROPONIN I (HIGH SENSITIVITY) - Abnormal; Notable for the following components:   Troponin I (High Sensitivity) 22 (*)    All other components within normal limits  TROPONIN I (HIGH SENSITIVITY) - Abnormal; Notable for the following components:   Troponin I (High Sensitivity) 26 (*)    All other components within normal limits  RESP PANEL BY RT-PCR (RSV, FLU A&B, COVID)  RVPGX2  CULTURE, BLOOD (ROUTINE X 2)  CULTURE, BLOOD (ROUTINE X 2)  URINE CULTURE  GASTROINTESTINAL PANEL BY PCR, STOOL (REPLACES STOOL CULTURE)  C DIFFICILE QUICK SCREEN W PCR REFLEX    LIPASE, BLOOD  LACTIC ACID, PLASMA  APTT  URINALYSIS, COMPLETE (UACMP) WITH MICROSCOPIC  BASIC METABOLIC PANEL  CBC  HEMOGLOBIN A1C  BASIC METABOLIC PANEL  POTASSIUM  POTASSIUM  POTASSIUM  POTASSIUM  POTASSIUM      Patient was temporized with calcium, Lokelma, albuterol, bicarb infusion, IV fluids.  Repeat potassium with mild improvement/temporized.  Repeat EKG improved when compared to initial EKG with shortened intervals.  Blood pressure significantly improved on sepsis reevaluation.  Do not feel that further IV fluids are necessary at this time.  Consulted nephrology and discussed the patient's case with Dr. Cherylann Ratel, recommended another dose of Lokelma and 2 A of bicarb in addition to the bicarb  infusion.  Did not recommend emergent dialysis at this time.  Consulted hospitalist for admission sepsis and acute kidney injury with hyperkalemia   PROCEDURES:  Critical Care performed: Yes  .Critical Care  Performed by: Corena Herter, MD Authorized by: Corena Herter, MD   Critical care provider statement:  Critical care time (minutes):  45   Critical care time was exclusive of:  Separately billable procedures and treating other patients   Critical care was necessary to treat or prevent imminent or life-threatening deterioration of the following conditions:  Endocrine crisis   Critical care was time spent personally by me on the following activities:  Development of treatment plan with patient or surrogate, discussions with consultants, evaluation of patient's response to treatment, examination of patient, ordering and review of laboratory studies, ordering and review of radiographic studies, ordering and performing treatments and interventions, pulse oximetry, re-evaluation of patient's condition and review of old charts   Patient's presentation is most consistent with acute presentation with potential threat to life or bodily function.   MEDICATIONS ORDERED IN ED: Medications  ALPRAZolam (XANAX) tablet 0.25 mg (has no administration in time range)  acetaminophen (TYLENOL) tablet 650 mg (has no administration in time range)    Or  acetaminophen (TYLENOL) suppository 650 mg (has no administration in time range)  ondansetron (ZOFRAN) tablet 4 mg (has no administration in time range)    Or  ondansetron (ZOFRAN) injection 4 mg (has no administration in time range)  heparin injection 5,000 Units (has no administration in time range)  senna-docusate (Senokot-S) tablet 1 tablet (has no administration in time range)  butalbital-acetaminophen-caffeine (FIORICET) 50-325-40 MG per tablet 1 tablet (has no administration in time range)  FLUoxetine (PROZAC) capsule 20 mg (has no  administration in time range)  hydrALAZINE (APRESOLINE) injection 5 mg (has no administration in time range)  insulin aspart (novoLOG) injection 0-9 Units (has no administration in time range)  insulin aspart (novoLOG) injection 0-5 Units ( Subcutaneous Not Given 01/28/22 2100)  nystatin (MYCOSTATIN) 100000 UNIT/ML suspension 500,000 Units (500,000 Units Oral Given 01/28/22 2154)  menthol-cetylpyridinium (CEPACOL) lozenge 3 mg (has no administration in time range)  morphine (PF) 2 MG/ML injection 2 mg (has no administration in time range)  fentaNYL (SUBLIMAZE) injection 25 mcg (25 mcg Intravenous Given 01/28/22 2202)  0.9 %  sodium chloride infusion (has no administration in time range)  insulin aspart (novoLOG) injection 5 Units (has no administration in time range)    And  dextrose 50 % solution 50 mL (has no administration in time range)  sodium chloride 0.9 % bolus 1,000 mL (0 mLs Intravenous Stopped 01/28/22 1753)  sodium zirconium cyclosilicate (LOKELMA) packet 10 g (10 g Oral Given 01/28/22 1613)  calcium gluconate inj 10% (1 g) URGENT USE ONLY! (1 g Intravenous Given 01/28/22 1559)  albuterol (PROVENTIL) (2.5 MG/3ML) 0.083% nebulizer solution 10 mg (10 mg Nebulization Given 01/28/22 1604)  insulin aspart (novoLOG) injection 10 Units (10 Units Intravenous Given 01/28/22 1611)  sodium chloride 0.9 % bolus 1,000 mL (0 mLs Intravenous Stopped 01/28/22 1753)  ceFEPIme (MAXIPIME) 2 g in sodium chloride 0.9 % 100 mL IVPB (0 g Intravenous Stopped 01/28/22 1832)  metroNIDAZOLE (FLAGYL) IVPB 500 mg (0 mg Intravenous Stopped 01/28/22 1933)  sodium bicarbonate 150 mEq in dextrose 5 % 1,150 mL infusion ( Intravenous Stopped 01/28/22 2153)  fentaNYL (SUBLIMAZE) injection 50 mcg (50 mcg Intravenous Given 01/28/22 1837)  sodium zirconium cyclosilicate (LOKELMA) packet 10 g (10 g Oral Given 01/28/22 2037)  sodium bicarbonate injection 100 mEq (100 mEq Intravenous Given 01/28/22 1932)  morphine (PF) 4  MG/ML injection 4 mg (4 mg Intravenous Given 01/28/22 1944)    FINAL CLINICAL IMPRESSION(S) / ED DIAGNOSES   Final diagnoses:  Abdominal pain, unspecified abdominal location  AKI (acute kidney injury) (HCC)  Hyperkalemia  Rx / DC Orders   ED Discharge Orders     None        Note:  This document was prepared using Dragon voice recognition software and may include unintentional dictation errors.   Corena Herter, MD 01/28/22 [phone]

## 2022-01-28 NOTE — Assessment & Plan Note (Addendum)
Presented hypotensive. Holding home antihypertensives PRN hydralazine for now

## 2022-01-28 NOTE — Assessment & Plan Note (Signed)
Due to demand ischemia in setting of hypotension, AKI. No chest pain or acute changes on EKG.

## 2022-01-28 NOTE — ED Notes (Signed)
Urine sample sent to lab

## 2022-01-28 NOTE — Assessment & Plan Note (Addendum)
Pt met SIRS criteria on admission with tachycardia, leukocytosis, organ involvement is renal and respiratory.   Found to have UTI Sepsis physiology resolved.  --Completed antibiotics for UTI  --Started on Cipro/Flagyl 12/17 --Blood cultures negative, final.

## 2022-01-28 NOTE — ED Notes (Addendum)
Phlebotomy at bedside. Staff notified by this RN that not only is whatever was hemolyzed needed but also 1 more set of blood cultures. Antibiotics at bedside and to be hung once 2nd set of cultures collected.

## 2022-01-28 NOTE — Sepsis Progress Note (Signed)
Sepsis protocol monitored by eLink 

## 2022-01-28 NOTE — ED Notes (Signed)
Pt up to bedside toilet attempting to provide urine sample.

## 2022-01-28 NOTE — Assessment & Plan Note (Addendum)
Presented with marked leukocytosis and acute kidney injury. High suspicion for C diff or infectious etiology. Reha resolved chest after admission -- Canceled C diff and GI pathogen panel --Avoid anti-diarrheals given ileus

## 2022-01-28 NOTE — Progress Notes (Signed)
CODE SEPSIS - PHARMACY COMMUNICATION  **Broad Spectrum Antibiotics should be administered within 1 hour of Sepsis diagnosis**  Time Code Sepsis Called/Page Received: 1527  Antibiotics Ordered: Cefepime  Time of 1st antibiotic administration: 1755  Additional action taken by pharmacy: Messaged nurse  If necessary, Name of Provider/Nurse Contacted: Claudia Desanctis, RN   Barrie Folk ,PharmD Clinical Pharmacist  01/28/2022  3:34 PM

## 2022-01-28 NOTE — ED Notes (Signed)
Lab at bedside

## 2022-01-28 NOTE — Assessment & Plan Note (Signed)
Stable, not exacerbated.  Bronchodilators

## 2022-01-28 NOTE — Consult Note (Signed)
PHARMACY -  BRIEF ANTIBIOTIC NOTE   Pharmacy has received consult(s) for cefepime dosing from an ED provider.  The patient's profile has been reviewed for ht/wt/allergies/indication/available labs.    One time order(s) placed for Cefepime 2 grams  Further antibiotics/pharmacy consults should be ordered by admitting physician if indicated.                       Thank you, Barrie Folk 01/28/2022  3:32 PM

## 2022-01-28 NOTE — ED Notes (Signed)
MD Paduchowski informed of pt's critical K+ result of >7.5. pt taken to ED 11 and placed on monitor at this time. RN Greta Doom informed .

## 2022-01-28 NOTE — ED Notes (Signed)
Lab called to notify some labs hemolyzed. Notified them that may need phlebotomy's assistance; staff stated they will send phlebotomy soon.

## 2022-01-28 NOTE — Assessment & Plan Note (Signed)
With dry mucosa.  Pharynx difficult to visulaize.  --Started on Nystatin empirically on admisison - Cepacol (lozenges) as needed for sore throat

## 2022-01-28 NOTE — Assessment & Plan Note (Addendum)
History of CKD stage 3a Suspect prerenal azotemia given diarrhea and hypotension. Nephrology consulted in the ED. Off bicarb drip. Off IV fluids Avoid nephrotoxins, hypotension, IV contrast. Monitor BMP daily.

## 2022-01-28 NOTE — ED Triage Notes (Signed)
First Nurse Note Pt via EMS from home. Pt c/o abd pain, abd distention, and diarrhea for the past 4 days. Pt has a hx of anemia and cholecystectomy. EMS reports RA 88%, placed pt on 2L [place] and O2 increased. Denies hx of liver disease.  128/68 BP  100 HR

## 2022-01-28 NOTE — ED Provider Triage Note (Signed)
  Emergency Medicine Provider Triage Evaluation Note  Whitney Bernard , a 61 y.o.female,  was evaluated in triage.  Pt complains of abdominal pain.  Patient reports abdominal pain/distention and diarrhea x 4 days.  She is also been feeling some central chest pain as well.   Review of Systems  Positive: Chest pain, abdominal pain, diarrhea Negative: Denies fever, chest pain, vomiting  Physical Exam   Vitals:   01/28/22 1427 01/28/22 1435  BP: 95/65 (!) 85/65  Pulse: (!) 104   Resp: 20   Temp: 97.6 F (36.4 C)   SpO2: (!) 89%    Gen:   Awake, appears very uncomfortable and in pain. Resp:  Normal effort  MSK:   Moves extremities without difficulty  Other:    Medical Decision Making  Given the patient's initial medical screening exam, the following diagnostic evaluation has been ordered. The patient will be placed in the appropriate treatment space, once one is available, to complete the evaluation and treatment. I have discussed the plan of care with the patient and I have advised the patient that an ED physician or mid-level practitioner will reevaluate their condition after the test results have been received, as the results may give them additional insight into the type of treatment they may need.    Diagnostics: Labs, UA, EKG, CT angiogram  Treatments: none immediately   Varney Daily, [place] 01/28/22 1447

## 2022-01-28 NOTE — ED Notes (Signed)
Pt to [facility] now from imaging.

## 2022-01-28 NOTE — ED Notes (Signed)
Pt A&Ox4 currently.

## 2022-01-28 NOTE — Assessment & Plan Note (Signed)
Continue home fluoxetine, alprazolam PRN

## 2022-01-28 NOTE — Assessment & Plan Note (Signed)
Dietitian consulted ?

## 2022-01-28 NOTE — ED Notes (Signed)
Patient transported to CT 

## 2022-01-28 NOTE — Assessment & Plan Note (Addendum)
Initial K was 7.5, treated aggressively in the ED. Due to renal failure.  Normal on day of discharge

## 2022-01-28 NOTE — ED Notes (Signed)
All blood obtained by neighbor RN/provider Mumma.

## 2022-01-28 NOTE — Hospital Course (Signed)
Ms. Whitney Bernard is a 61 year old female with history of pancreatitis, CKD, CAD with prior history of MI, depression, hyperlipidemia, hypertension, rheumatoid arthritis, COPD, non-insulin-dependent diabetes mellitus type 2, who presents to the ED for chief concerns of abdominal pain and diarrhea.  Initial vitals in the emergency department showed temperature 97.6, respiration rate of 20, heart rate of 104, blood pressure 85/65, responded to IV fluid bolus and current blood pressure is 132/89, SpO2 of 89% and currently 100% on room air.  Initial serum sodium was 129, potassium 7.5, chloride of 105, bicarb of 8, BUN of 85, serum creatinine of 3.74, nonfasting blood glucose 202, alk phos was elevated at 148, EGFR of 13.  Patient was treated with albuterol nebulizer 10 mg one-time dose, calcium gluconate 1 g IV, insulin aspart 10 units IV, one-time dose of Lokelma.  Patient was also given sodium chloride 2 L bolus.  On recheck patient's serum sodium is 133, potassium improved to 6.5, chloride of 110, bicarb of 8, BUN of 81, serum creatinine of 3.56, and blood glucose level was 134, EGFR improved to 14.  EDP discussed case with nephrologist who recommends 1 more dose of Lokelma, start patient on sodium bicarb and dextrose infusion.  Patient also received metronidazole IV, cefepime per pharmacy, morphine 4 mg IV, fentanyl 50 mcg IV.

## 2022-01-29 DIAGNOSIS — R197 Diarrhea, unspecified: Secondary | ICD-10-CM

## 2022-01-29 DIAGNOSIS — N179 Acute kidney failure, unspecified: Secondary | ICD-10-CM | POA: Diagnosis not present

## 2022-01-29 DIAGNOSIS — E875 Hyperkalemia: Secondary | ICD-10-CM | POA: Diagnosis not present

## 2022-01-29 DIAGNOSIS — R6510 Systemic inflammatory response syndrome (SIRS) of non-infectious origin without acute organ dysfunction: Secondary | ICD-10-CM | POA: Diagnosis not present

## 2022-01-29 LAB — HEMOGLOBIN A1C
Hgb A1c MFr Bld: 7.4 % — ABNORMAL HIGH (ref 4.8–5.6)
Mean Plasma Glucose: 166 mg/dL

## 2022-01-29 LAB — CBG MONITORING, ED
Glucose-Capillary: 118 mg/dL — ABNORMAL HIGH (ref 70–99)
Glucose-Capillary: 121 mg/dL — ABNORMAL HIGH (ref 70–99)
Glucose-Capillary: 135 mg/dL — ABNORMAL HIGH (ref 70–99)
Glucose-Capillary: 146 mg/dL — ABNORMAL HIGH (ref 70–99)
Glucose-Capillary: 186 mg/dL — ABNORMAL HIGH (ref 70–99)

## 2022-01-29 LAB — BASIC METABOLIC PANEL
Anion gap: 11 (ref 5–15)
Anion gap: 8 (ref 5–15)
BUN: 72 mg/dL — ABNORMAL HIGH (ref 8–23)
BUN: 64 mg/dL — ABNORMAL HIGH (ref 8–23)
CO2: 15 mmol/L — ABNORMAL LOW (ref 22–32)
CO2: 17 mmol/L — ABNORMAL LOW (ref 22–32)
Calcium: 6.8 mg/dL — ABNORMAL LOW (ref 8.9–10.3)
Calcium: 7.3 mg/dL — ABNORMAL LOW (ref 8.9–10.3)
Chloride: 109 mmol/L (ref 98–111)
Chloride: 118 mmol/L — ABNORMAL HIGH (ref 98–111)
Creatinine, Ser: 2.9 mg/dL — ABNORMAL HIGH (ref 0.44–1.00)
Creatinine, Ser: 2.38 mg/dL — ABNORMAL HIGH (ref 0.44–1.00)
GFR, Estimated: 18 mL/min — ABNORMAL LOW (ref >60)
GFR, Estimated: 22 mL/min — ABNORMAL LOW (ref >60)
Glucose, Bld: 313 mg/dL — ABNORMAL HIGH (ref 70–99)
Glucose, Bld: 137 mg/dL — ABNORMAL HIGH (ref 70–99)
Potassium: 5.1 mmol/L (ref 3.5–5.1)
Potassium: 4.8 mmol/L (ref 3.5–5.1)
Sodium: 135 mmol/L (ref 135–145)
Sodium: 143 mmol/L (ref 135–145)

## 2022-01-29 LAB — CBC
HCT: 37.3 % (ref 36.0–46.0)
Hemoglobin: 11.2 g/dL — ABNORMAL LOW (ref 12.0–15.0)
MCH: 25.9 pg — ABNORMAL LOW (ref 26.0–34.0)
MCHC: 30 g/dL (ref 30.0–36.0)
MCV: 86.3 fL (ref 80.0–100.0)
Platelets: 436 10*3/uL — ABNORMAL HIGH (ref 150–400)
RBC: 4.32 MIL/uL (ref 3.87–5.11)
RDW: 17.2 % — ABNORMAL HIGH (ref 11.5–15.5)
WBC: 21.1 10*3/uL — ABNORMAL HIGH (ref 4.0–10.5)
nRBC: 0 % (ref 0.0–0.2)

## 2022-01-29 LAB — ALBUMIN: Albumin: 2.6 g/dL — ABNORMAL LOW (ref 3.5–5.0)

## 2022-01-29 LAB — POTASSIUM
Potassium: 4.1 mmol/L (ref 3.5–5.1)
Potassium: 3.9 mmol/L (ref 3.5–5.1)

## 2022-01-29 NOTE — Progress Notes (Signed)
Progress Note   Patient: Whitney Bernard KXF:[id number] DOB: 04-29-1960 DOA: 01/28/2022     1 DOS: the patient was seen and examined on 01/29/2022   Brief hospital course: Ms. Whitney Bernard is a 61 year old female with history of pancreatitis, CKD, CAD with prior history of MI, depression, hyperlipidemia, hypertension, rheumatoid arthritis, COPD, non-insulin-dependent diabetes mellitus type 2, who presented to the ED on 01/28/2022 for evaluation of abdominal pain and diarrhea.  Patient was tachypneic, tachycardic, hypotensive but afebrile in the ED. BP responded well to IV fluids.   Labs notable for Na 129, K 7.5, Cr 3.74. High anion gap metabolic acidosis.  WBC was 27.8k.    Hyperkalemia was aggressively treated and improved. Started on IV fluids and sodium level improved. EDP discussed case with nephrologist who recommends 1 more dose of Lokelma, starting sodium bicarb and dextrose infusion. Treated in the ED with metronidazole IV, cefepime, morphine 4 mg IV, fentanyl 50 mcg IV.  Admitted to medicine service for further evaluation and management. Stool studies were ordered to determine if infectious etiology.  Assessment and Plan: * Hyperkalemia Initial K was 7.5, treated aggressively in the ED. Due to renal failure. K normalized, 4.8 this AM. Monitor BMP.  SIRS (systemic inflammatory response syndrome) (HCC) Pt met SIRS criteria on admission with tachycardia, leukocytosis, organ involvement is renal and respiratory.   So far no source of infection but stool studies pending. --Follow C diff and GI panel --Follow cultures --No further antibiotics for now (given IV abx in ED)   AKI (acute kidney injury) ([place]) History of CKD stage 3a Suspect prerenal azotemia given diarrhea and hypotension. Nephrology consulted in the ED. Off bicarb drip. Continue NS @ 125 cc/hr for now. Avoid nephrotoxins, hypotension, IV contrast. Monitor BMP daily.   Diarrhea Presented with marked  leukocytosis and acute kidney injury. High suspicion for C diff or infectious etiology. --Follow pending C diff and GI pathogen panel --Enteric precautions for now --Avoid anti-diarrheals for now until stool studies result  GERD (gastroesophageal reflux disease) Appears not on PPI or H2 blocker.  COPD (chronic obstructive pulmonary disease) (HCC) Stable, not exacerbated.  Bronchodilators  Depression with anxiety Continue home fluoxetine, alprazolam PRN  Generalized weakness Due to acute illness, hypotension. PT evaluation if needed  Hypertension Presented hypotensive. Holding home antihypertensives PRN hydralazine for now  Type II diabetes mellitus with renal manifestations (HCC) Non-insulin-dependent. A1c pending. Hold metformin due to AKI Sliding scale Novolog  Sore throat With dry mucosa.  Pharynx difficult to visulaize.  --Started on Nystatin empirically on admisison - Cepacol (lozenges) as needed for sore throat   Elevated troponin Due to demand ischemia in setting of hypotension, AKI. No chest pain or acute changes on EKG.  Protein-calorie malnutrition, severe Dietitian consulted        Subjective: Pt seen in the ED holding for a bed. Reports feeling about the same with ongoing abdominal pain and episodes of diarrhea have continued. No fever or chills.  No other acute complaints.   Physical Exam: Vitals:   01/29/22 0848 01/29/22 1100 01/29/22 1145 01/29/22 1400  BP:  (!) 139/51  135/75  Pulse: (!) 106 (!) 108 100 93  Resp: 15 (!) _0 Temp:      TempSrc:      SpO2: 100% 100% 100% 100%  Weight:      Height:       General exam: awake, alert, no acute distress, mildly ill-appearing HEENT: moist mucus membranes, hearing grossly normal  Respiratory system: CTAB, no wheezes, rales or rhonchi, normal respiratory effort. Cardiovascular system: normal S1/S2, RRR, no pedal edema.   Gastrointestinal system: protuberant abdomen, mildly tender no  rebound or guarding, +bowel sounds. Central nervous system: A&O x3. no gross focal neurologic deficits, normal speech Extremities: moves all, no edema, normal tone Skin: dry, intact, normal temperature Psychiatry: normal mood, congruent affect, judgement and insight appear normal  Data Reviewed:  Labs notable for -- K normalized 4.8, Cl 118, bicarb 17, gap normalized 8, Cr improving 2.38, Ca 7.3 with hypoalbuminemia, WBC improving 21.1k, Hbg 11.2, platelets 436k  Family Communication: None  Disposition: Status is: Inpatient Remains inpatient appropriate because: Ongoing evaluation, persistent abdmoinal pain and diarrhea, renal function remains significantly impaired   Planned Discharge Destination: Home    Time spent: 40 minutes  Author: Ezekiel Slocumb, DO 01/29/2022 3:01 PM  For on call review [email or web address].

## 2022-01-29 NOTE — Assessment & Plan Note (Signed)
Appears not on PPI or H2 blocker.

## 2022-01-29 NOTE — ED Notes (Signed)
Pt provided with shasta per request, no further needs at this time. WCTM.  

## 2022-01-29 NOTE — ED Notes (Signed)
Pt with large BM. Pt cleaned, new brief placed, new purewick placed, pt positioned in bed for comfort. Pt provided with shasta per request, no further needs at this time. Call light in reach, WCTM.

## 2022-01-29 NOTE — Assessment & Plan Note (Signed)
Due to acute illness, hypotension. PT evaluation if needed

## 2022-01-29 NOTE — ED Notes (Signed)
Multiple attempts made to obtain labs without success. Lab contacted for assistance at this time

## 2022-01-30 ENCOUNTER — Encounter: Payer: Self-pay | Admitting: Internal Medicine

## 2022-01-30 DIAGNOSIS — R109 Unspecified abdominal pain: Secondary | ICD-10-CM | POA: Diagnosis present

## 2022-01-30 DIAGNOSIS — N3000 Acute cystitis without hematuria: Secondary | ICD-10-CM

## 2022-01-30 DIAGNOSIS — R197 Diarrhea, unspecified: Secondary | ICD-10-CM | POA: Diagnosis not present

## 2022-01-30 DIAGNOSIS — N390 Urinary tract infection, site not specified: Secondary | ICD-10-CM | POA: Diagnosis present

## 2022-01-30 DIAGNOSIS — E875 Hyperkalemia: Secondary | ICD-10-CM | POA: Diagnosis not present

## 2022-01-30 DIAGNOSIS — R1084 Generalized abdominal pain: Secondary | ICD-10-CM

## 2022-01-30 LAB — COMPREHENSIVE METABOLIC PANEL
ALT: 8 U/L (ref 0–44)
AST: 10 U/L — ABNORMAL LOW (ref 15–41)
Albumin: 2.8 g/dL — ABNORMAL LOW (ref 3.5–5.0)
Alkaline Phosphatase: 121 U/L (ref 38–126)
Anion gap: 9 (ref 5–15)
BUN: 40 mg/dL — ABNORMAL HIGH (ref 8–23)
CO2: 14 mmol/L — ABNORMAL LOW (ref 22–32)
Calcium: 8.1 mg/dL — ABNORMAL LOW (ref 8.9–10.3)
Chloride: 114 mmol/L — ABNORMAL HIGH (ref 98–111)
Creatinine, Ser: 1.32 mg/dL — ABNORMAL HIGH (ref 0.44–1.00)
GFR, Estimated: 45 mL/min — ABNORMAL LOW (ref >60)
Glucose, Bld: 127 mg/dL — ABNORMAL HIGH (ref 70–99)
Potassium: 3.6 mmol/L (ref 3.5–5.1)
Sodium: 137 mmol/L (ref 135–145)
Total Bilirubin: 0.5 mg/dL (ref 0.3–1.2)
Total Protein: 6.6 g/dL (ref 6.5–8.1)

## 2022-01-30 LAB — CBC
HCT: 39.3 % (ref 36.0–46.0)
Hemoglobin: 12.2 g/dL (ref 12.0–15.0)
MCH: 25.9 pg — ABNORMAL LOW (ref 26.0–34.0)
MCHC: 31 g/dL (ref 30.0–36.0)
MCV: 83.4 fL (ref 80.0–100.0)
Platelets: 446 10*3/uL — ABNORMAL HIGH (ref 150–400)
RBC: 4.71 MIL/uL (ref 3.87–5.11)
RDW: 17.2 % — ABNORMAL HIGH (ref 11.5–15.5)
WBC: 19.2 10*3/uL — ABNORMAL HIGH (ref 4.0–10.5)
nRBC: 0 % (ref 0.0–0.2)

## 2022-01-30 LAB — CBG MONITORING, ED
Glucose-Capillary: 172 mg/dL — ABNORMAL HIGH (ref 70–99)
Glucose-Capillary: 107 mg/dL — ABNORMAL HIGH (ref 70–99)

## 2022-01-30 LAB — MAGNESIUM: Magnesium: 1.4 mg/dL — ABNORMAL LOW (ref 1.7–2.4)

## 2022-01-30 LAB — GLUCOSE, CAPILLARY
Glucose-Capillary: 122 mg/dL — ABNORMAL HIGH (ref 70–99)
Glucose-Capillary: 146 mg/dL — ABNORMAL HIGH (ref 70–99)

## 2022-01-30 LAB — URINE CULTURE: Culture: >=100000 — AB

## 2022-01-30 MED ORDER — MORPHINE SULFATE (PF) 2 MG/ML IV SOLN
2.0000 mg | INTRAVENOUS | Status: AC | PRN
  Administered 2022-01-30 – 2022-01-31 (×3): 2 mg via INTRAVENOUS
  Filled 2022-01-30 (×3): qty 1

## 2022-01-30 MED ORDER — MAGNESIUM SULFATE 4 GM/100ML IV SOLN
4.0000 g | Freq: Once | INTRAVENOUS | Status: AC
  Administered 2022-01-30: 4 g via INTRAVENOUS
  Filled 2022-01-30: qty 100

## 2022-01-30 MED ORDER — ADULT MULTIVITAMIN W/MINERALS CH
1.0000 | ORAL_TABLET | Freq: Every day | ORAL | Status: DC
  Administered 2022-01-30 – 2022-02-05 (×7): 1 via ORAL
  Filled 2022-01-30 (×7): qty 1

## 2022-01-30 MED ORDER — SODIUM CHLORIDE 0.9 % IV SOLN
1.0000 g | Freq: Three times a day (TID) | INTRAVENOUS | Status: DC
  Administered 2022-01-30 – 2022-02-01 (×6): 1 g via INTRAVENOUS
  Filled 2022-01-30 (×7): qty 1000

## 2022-01-30 MED ORDER — SODIUM BICARBONATE 650 MG PO TABS
650.0000 mg | ORAL_TABLET | Freq: Three times a day (TID) | ORAL | Status: AC
  Administered 2022-01-30 (×3): 650 mg via ORAL
  Filled 2022-01-30 (×3): qty 1

## 2022-01-30 MED ORDER — GLUCERNA SHAKE PO LIQD
237.0000 mL | Freq: Three times a day (TID) | ORAL | Status: DC
  Administered 2022-01-30 – 2022-02-05 (×13): 237 mL via ORAL

## 2022-01-30 MED ORDER — PROSOURCE PLUS PO LIQD
30.0000 mL | Freq: Three times a day (TID) | ORAL | Status: DC
  Administered 2022-01-30 – 2022-02-05 (×14): 30 mL via ORAL

## 2022-01-30 NOTE — Progress Notes (Addendum)
Progress Note   Patient: Whitney Bernard YKD:[id number] DOB: 07/26/1960 DOA: 01/28/2022     2 DOS: the patient was seen and examined on 01/30/2022   Brief hospital course: Ms. Clorissa Gruenberg is a 61 year old female with history of pancreatitis, CKD, CAD with prior history of MI, depression, hyperlipidemia, hypertension, rheumatoid arthritis, COPD, non-insulin-dependent diabetes mellitus type 2, who presented to the ED on 01/28/2022 for evaluation of abdominal pain and diarrhea.  Patient was tachypneic, tachycardic, hypotensive but afebrile in the ED. BP responded well to IV fluids.   Labs notable for Na 129, K 7.5, Cr 3.74. High anion gap metabolic acidosis.  WBC was 27.8k.    Hyperkalemia was aggressively treated and improved. Started on IV fluids and sodium level improved. EDP discussed case with nephrologist who recommends 1 more dose of Lokelma, starting sodium bicarb and dextrose infusion. Treated in the ED with metronidazole IV, cefepime, morphine 4 mg IV, fentanyl 50 mcg IV.  Admitted to medicine service for further evaluation and management. Stool studies were ordered to determine if infectious etiology.  Assessment and Plan: * Hyperkalemia Initial K was 7.5, treated aggressively in the ED. Due to renal failure. K normalized, 4.8 this AM. Monitor BMP.  UTI (urinary tract infection) Urine culture on admission with pan-sensitive E coli.  UTI could explain patient's symptoms, but seem out of proportion, concern for acute GI illness / infection. --Start IV ampicillin  --Follow stool studies   Abdominal pain Abdominal distention. CT abd/pelvis was unrevealing as to cause. ? If developing ascites. Pt denies hx of liver disease. U/S pending for tomorrow AM (NPO at midnight) Supportive care. Follow pending stool studies. Starting antibiotic for UTI today.  Hypomagnesemia Mg 1.4 this AM. Replacing with 4 g IV Mg-sulfate. Monitor Mg, replace PRN  Severe sepsis ([place]) Pt met SIRS  criteria on admission with tachycardia, leukocytosis, organ involvement is renal and respiratory.   Found to have UTI, also ? GI infection --Follow C diff and GI panel --Started antibiotics for UTI today (12/14)   AKI (acute kidney injury) ([place]) History of CKD stage 3a Suspect prerenal azotemia given diarrhea and hypotension. Nephrology consulted in the ED. Off bicarb drip. Continue NS @ 125 cc/hr for now. Avoid nephrotoxins, hypotension, IV contrast. Monitor BMP daily.   Diarrhea Presented with marked leukocytosis and acute kidney injury. High suspicion for C diff or infectious etiology. --Follow pending C diff and GI pathogen panel --Enteric precautions for now --Avoid anti-diarrheals for now until stool studies result  Sore throat With dry mucosa.  Pharynx difficult to visulaize.  --Started on Nystatin empirically on admisison - Cepacol (lozenges) as needed for sore throat   Generalized weakness Due to acute illness, hypotension. PT evaluation if needed  Elevated troponin Due to demand ischemia in setting of hypotension, AKI. No chest pain or acute changes on EKG.  GERD (gastroesophageal reflux disease) Appears not on PPI or H2 blocker.  COPD (chronic obstructive pulmonary disease) (HCC) Stable, not exacerbated.  Bronchodilators  Depression with anxiety Continue home fluoxetine, alprazolam PRN  Hypertension Presented hypotensive. Holding home antihypertensives PRN hydralazine for now  Type II diabetes mellitus with renal manifestations (HCC) Non-insulin-dependent. A1c pending. Hold metformin due to AKI Sliding scale Novolog  Protein-calorie malnutrition, severe Dietitian consulted        Subjective: Pt seen in the ED still holding for a bed this AM. She is having severe diffuse abdominal pain. Reports ongoing diarrhea.  Eating little to no food due to the pain.  Physical Exam: Vitals:   01/30/22 1200 01/30/22 1208 01/30/22 1300 01/30/22 1402   BP: (!) 166/101 (!) 166/101 (!) 181/105 (!) 168/110  Pulse: 89 89 (!) 104 (!) 105  Resp:  (!) 22  18  Temp:  98.3 F (36.8 C)  97.9 F (36.6 C)  TempSrc:  Oral    SpO2: 98% 98% 98% 100%  Weight:      Height:       General exam: awake, alert, no acute distress, appears in pain (wincing), ill-appearing HEENT: moist mucus membranes, hearing grossly normal  Respiratory system: CTAB, no wheezes, rales or rhonchi, normal respiratory effort. Cardiovascular system: normal S1/S2, RRR, no pedal edema.   Gastrointestinal system: abdomen is distended and markedly tender on even light palpation, +bowel sounds. Central nervous system: A&O x3. no gross focal neurologic deficits, normal speech Extremities: moves all, no edema, normal tone Skin: dry, intact, normal temperature Psychiatry: normal mood, congruent affect, judgement and insight appear normal  Data Reviewed:  Labs notable for --  Cl 114, bicarb 14 from 17 (gap normal at 9), glucose 127, BUN 40, Cr improved 1.32 from 2.38, Ca 8.1, Mg 1.4, albumin 2.8, AST low 10, GFR 45, WBC 19.2 from 21.1, platelets 446k.  Urine culture grew pan-sensitive E. Coli  PENDING --- C diff and GI pathogen panel.    Family Communication: None  Disposition: Status is: Inpatient Remains inpatient appropriate because: Persistent abdmoinal pain and diarrhea, ongoing evaluation, requiring IV medications.   Planned Discharge Destination: Home    Time spent: 40 minutes  Author: Ezekiel Slocumb, DO 01/30/2022 3:14 PM  For on call review [email or web address].

## 2022-01-30 NOTE — Assessment & Plan Note (Addendum)
Mg 1.4 on 12/14 was replaced. Monitor Mg, replace PRN. 12/17: Mg 1.6, IV replacement ordered.

## 2022-01-30 NOTE — Assessment & Plan Note (Addendum)
Urine culture on admission with pan-sensitive E coli.  UTI could explain patient's symptoms, but seem out of proportion, concern for acute GI illness / infection. -- Continue IV ampicillin

## 2022-01-30 NOTE — Progress Notes (Signed)
Initial Nutrition Assessment  DOCUMENTATION CODES:   Not applicable  INTERVENTION:   -Liberalize diet to carb modified -MVI with minerals daily -Glucerna Shake po TID, each supplement provides 220 kcal and 10 grams of protein  -30 ml Prosource Plus TID, each supplement provides 100 kcals and 15 grams protein  NUTRITION DIAGNOSIS:   Increased nutrient needs related to poor appetite as evidenced by meal completion < 25%.  GOAL:   Patient will meet greater than or equal to 90% of their needs  MONITOR:   PO intake, Supplement acceptance  REASON FOR ASSESSMENT:   Consult Assessment of nutrition requirement/status  ASSESSMENT:   Pt with history of pancreatitis, CKD, CAD with prior history of MI, depression, hyperlipidemia, hypertension, rheumatoid arthritis, COPD, non-insulin-dependent diabetes mellitus type 2, who presents for chief concerns of abdominal pain and diarrhea.  Pt admitted with hyperkalemia and SIRS.   Reviewed I/O's: +849 ml x 24 hours  UOP: 900 ml x 24 hours  Spoke with pt at bedside, who was tearful at time of visit. Pt complains of stomach pain, which is impacting her intake. Per pt, she only has been able to keep down cola today.    Pt currently on a heart healthy diet. Plan NPO at midnight for ultrasound tomorrow. Pt with significant abdominal distention.   Reviewed wt hx; no wt loss noted over the past 2 years.   Medications reviewed and include sodium bicarbonate.   Lab Results  Component Value Date   HGBA1C 7.4 (H) 01/29/2022   PTA DM medications are 500 mg meformin daily.   Labs reviewed: CBGS: 107-172 (inpatient orders for glycemic control are 0-5 units insulin aspart TID with meals and 0-5 units insulin aspart daily at bedtime).    NUTRITION - FOCUSED PHYSICAL EXAM:  Flowsheet Row Most Recent Value  Orbital Region No depletion  Upper Arm Region No depletion  Thoracic and Lumbar Region No depletion  Buccal Region No depletion  Temple  Region No depletion  Clavicle Bone Region No depletion  Clavicle and Acromion Bone Region No depletion  Scapular Bone Region No depletion  Dorsal Hand No depletion  Patellar Region No depletion  Anterior Thigh Region No depletion  Posterior Calf Region No depletion  Edema (RD Assessment) None  Hair Reviewed  Eyes Reviewed  Mouth Reviewed  Skin Reviewed  Nails Reviewed       Diet Order:   Diet Order             Diet NPO time specified  Diet effective midnight           Diet Heart Room service appropriate? Yes; Fluid consistency: Thin  Diet effective now                   EDUCATION NEEDS:   Education needs have been addressed  Skin:  Skin Assessment: Reviewed RN Assessment  Last BM:  01/29/22  Height:   Ht Readings from Last 1 Encounters:  01/28/22 5\' 4"  (1.626 m)    Weight:   Wt Readings from Last 1 Encounters:  01/28/22 74.4 kg    Ideal Body Weight:  54.5 kg  BMI:  Body mass index is 28.15 kg/m.  Estimated Nutritional Needs:   Kcal:  1650-1850  Protein:  85-100 grams  Fluid:  > 1.6 L    14/12/23, RD, LDN, CDCES Registered Dietitian II Certified Diabetes Care and Education Specialist Please refer to [facility] for RD and/or RD on-call/weekend/after hours pager

## 2022-01-30 NOTE — Assessment & Plan Note (Addendum)
Abdominal distention due to Ileus.  Initial CT abd/pelvis was unrevealing as to cause.  Concern for ascites but no hx of liver disease, only trace ascites on U/S. Stool studies canceled as patient no longer having diarrhea. Now with ileus - mgmt as outlined. Pain control as needed  12/17 - started Cipro/Flagyl after repeat CT findings of enteritis of terminal ileum. --Continue Cipro/Flagyl, and his diet was able to be advanced, patient will be discharged on total of 10 days of antibiotics as per surgery recommendations.  Diet advanced and she is able to tolerate food without abdominal pain, nausea vomiting or diarrhea

## 2022-01-31 ENCOUNTER — Inpatient Hospital Stay: Payer: 59

## 2022-01-31 DIAGNOSIS — K560 Paralytic ileus: Secondary | ICD-10-CM | POA: Diagnosis not present

## 2022-01-31 DIAGNOSIS — E872 Acidosis, unspecified: Secondary | ICD-10-CM | POA: Diagnosis present

## 2022-01-31 DIAGNOSIS — R1084 Generalized abdominal pain: Secondary | ICD-10-CM | POA: Diagnosis not present

## 2022-01-31 LAB — BASIC METABOLIC PANEL
Anion gap: 9 (ref 5–15)
BUN: 30 mg/dL — ABNORMAL HIGH (ref 8–23)
CO2: 18 mmol/L — ABNORMAL LOW (ref 22–32)
Calcium: 8.8 mg/dL — ABNORMAL LOW (ref 8.9–10.3)
Chloride: 108 mmol/L (ref 98–111)
Creatinine, Ser: 1.14 mg/dL — ABNORMAL HIGH (ref 0.44–1.00)
GFR, Estimated: 54 mL/min — ABNORMAL LOW (ref >60)
Glucose, Bld: 154 mg/dL — ABNORMAL HIGH (ref 70–99)
Potassium: 3.4 mmol/L — ABNORMAL LOW (ref 3.5–5.1)
Sodium: 135 mmol/L (ref 135–145)

## 2022-01-31 LAB — CBC WITH DIFFERENTIAL/PLATELET
Abs Immature Granulocytes: 0.32 10*3/uL — ABNORMAL HIGH (ref 0.00–0.07)
Basophils Absolute: 0.1 10*3/uL (ref 0.0–0.1)
Basophils Relative: 0 %
Eosinophils Absolute: 0 10*3/uL (ref 0.0–0.5)
Eosinophils Relative: 0 %
HCT: 41.5 % (ref 36.0–46.0)
Hemoglobin: 13.2 g/dL (ref 12.0–15.0)
Immature Granulocytes: 2 %
Lymphocytes Relative: 6 %
Lymphs Abs: 1.3 10*3/uL (ref 0.7–4.0)
MCH: 26.4 pg (ref 26.0–34.0)
MCHC: 31.8 g/dL (ref 30.0–36.0)
MCV: 83 fL (ref 80.0–100.0)
Monocytes Absolute: 2 10*3/uL — ABNORMAL HIGH (ref 0.1–1.0)
Monocytes Relative: 10 %
Neutro Abs: 17.4 10*3/uL — ABNORMAL HIGH (ref 1.7–7.7)
Neutrophils Relative %: 82 %
Platelets: 509 10*3/uL — ABNORMAL HIGH (ref 150–400)
RBC: 5 MIL/uL (ref 3.87–5.11)
RDW: 16.5 % — ABNORMAL HIGH (ref 11.5–15.5)
WBC: 21.2 10*3/uL — ABNORMAL HIGH (ref 4.0–10.5)
nRBC: 0 % (ref 0.0–0.2)

## 2022-01-31 LAB — URINALYSIS, COMPLETE (UACMP) WITH MICROSCOPIC
Bacteria, UA: NONE SEEN
Bilirubin Urine: NEGATIVE
Glucose, UA: NEGATIVE mg/dL
Hgb urine dipstick: NEGATIVE
Ketones, ur: 5 mg/dL — AB
Leukocytes,Ua: NEGATIVE
Nitrite: NEGATIVE
Protein, ur: 100 mg/dL — AB
Specific Gravity, Urine: 1.014 (ref 1.005–1.030)
Squamous Epithelial / HPF: NONE SEEN (ref 0–5)
pH: 6 (ref 5.0–8.0)

## 2022-01-31 LAB — GLUCOSE, CAPILLARY
Glucose-Capillary: 144 mg/dL — ABNORMAL HIGH (ref 70–99)
Glucose-Capillary: 210 mg/dL — ABNORMAL HIGH (ref 70–99)
Glucose-Capillary: 144 mg/dL — ABNORMAL HIGH (ref 70–99)
Glucose-Capillary: 94 mg/dL (ref 70–99)

## 2022-01-31 LAB — BLOOD GAS, VENOUS
Acid-base deficit: 4.7 mmol/L — ABNORMAL HIGH (ref 0.0–2.0)
Bicarbonate: 19.5 mmol/L — ABNORMAL LOW (ref 20.0–28.0)
O2 Saturation: 89.5 %
Patient temperature: 37
pCO2, Ven: 33 mmHg — ABNORMAL LOW (ref 44–60)
pH, Ven: 7.38 (ref 7.25–7.43)
pO2, Ven: 56 mmHg — ABNORMAL HIGH (ref 32–45)

## 2022-01-31 LAB — MAGNESIUM: Magnesium: 2.1 mg/dL (ref 1.7–2.4)

## 2022-01-31 LAB — PROCALCITONIN: Procalcitonin: 0.21 ng/mL

## 2022-01-31 MED ORDER — MORPHINE SULFATE (PF) 2 MG/ML IV SOLN
2.0000 mg | INTRAVENOUS | Status: AC | PRN
  Administered 2022-01-31 – 2022-02-01 (×6): 2 mg via INTRAVENOUS
  Filled 2022-01-31 (×6): qty 1

## 2022-01-31 MED ORDER — SENNOSIDES-DOCUSATE SODIUM 8.6-50 MG PO TABS
1.0000 | ORAL_TABLET | Freq: Two times a day (BID) | ORAL | Status: DC
  Administered 2022-01-31 – 2022-02-05 (×7): 1 via ORAL
  Filled 2022-01-31 (×11): qty 1

## 2022-01-31 MED ORDER — BISACODYL 5 MG PO TBEC: 5.0000 mg | DELAYED_RELEASE_TABLET | Freq: Every day | ORAL | Status: DC | PRN

## 2022-01-31 MED ORDER — ARIPIPRAZOLE 2 MG PO TABS
2.0000 mg | ORAL_TABLET | Freq: Every day | ORAL | Status: DC
  Administered 2022-01-31 – 2022-02-05 (×6): 2 mg via ORAL
  Filled 2022-01-31 (×6): qty 1

## 2022-01-31 MED ORDER — POLYETHYLENE GLYCOL 3350 17 G PO PACK
17.0000 g | PACK | Freq: Every day | ORAL | Status: DC
  Administered 2022-01-31 – 2022-02-03 (×3): 17 g via ORAL
  Filled 2022-01-31 (×6): qty 1

## 2022-01-31 MED ORDER — ALPRAZOLAM 0.25 MG PO TABS
0.2500 mg | ORAL_TABLET | Freq: Two times a day (BID) | ORAL | Status: DC
  Administered 2022-01-31 – 2022-02-05 (×11): 0.25 mg via ORAL
  Filled 2022-01-31 (×11): qty 1

## 2022-01-31 MED ORDER — HYDRALAZINE HCL 25 MG PO TABS
25.0000 mg | ORAL_TABLET | Freq: Four times a day (QID) | ORAL | Status: DC | PRN
  Administered 2022-02-01: 25 mg via ORAL
  Filled 2022-01-31: qty 1

## 2022-01-31 MED ORDER — POTASSIUM CHLORIDE CRYS ER 20 MEQ PO TBCR
40.0000 meq | EXTENDED_RELEASE_TABLET | Freq: Once | ORAL | Status: AC
  Administered 2022-01-31: 40 meq via ORAL
  Filled 2022-01-31: qty 2

## 2022-01-31 MED ORDER — KETOROLAC TROMETHAMINE 15 MG/ML IJ SOLN
15.0000 mg | Freq: Four times a day (QID) | INTRAMUSCULAR | Status: AC | PRN
  Administered 2022-01-31 – 2022-02-05 (×16): 15 mg via INTRAVENOUS
  Filled 2022-01-31 (×16): qty 1

## 2022-01-31 MED ORDER — SODIUM BICARBONATE 650 MG PO TABS
650.0000 mg | ORAL_TABLET | Freq: Three times a day (TID) | ORAL | Status: AC
  Administered 2022-01-31 (×3): 650 mg via ORAL
  Filled 2022-01-31 (×3): qty 1

## 2022-01-31 MED ORDER — ENOXAPARIN SODIUM 40 MG/0.4ML IJ SOSY
40.0000 mg | PREFILLED_SYRINGE | Freq: Every day | INTRAMUSCULAR | Status: DC
  Administered 2022-01-31 – 2022-02-04 (×5): 40 mg via SUBCUTANEOUS
  Filled 2022-01-31 (×5): qty 0.4

## 2022-01-31 NOTE — Assessment & Plan Note (Signed)
Initially with hyperkalemia in setting of AKI. K today normal.  Monitor BMP & replace K PRN

## 2022-01-31 NOTE — Assessment & Plan Note (Addendum)
Resolved. Initially in the setting of renal failure and diarrhea.  Patient is off bicarb drip.  Given PO oral bicarb x 2 days. Bicarb has normalized.

## 2022-01-31 NOTE — Assessment & Plan Note (Addendum)
Abdominal x-ray obtained today 12/15 due to progressive abdominal distention.  It showed diffuse small bowel distention consistent with adynamic ileus.  Suspect caused by patient taking excessive Imodium. 12/16: upright KUB shows persistent to slightly worsened small bowel dilated loops, ileus vs SBO 12/17: surgery repeating CT scan today given return of bowel function but persistent pain and distention. Started antibiotics for CT evidence of enteritis of terminal ileum. --General surgery following -- Diet advanced to fulls, advancement per surgery --Bowel regimen per orders --Monitor abdominal exam and bowel function closely

## 2022-01-31 NOTE — Progress Notes (Addendum)
Progress Note   Patient: Whitney Bernard CNO:[id number] DOB: June 11, 1960 DOA: 01/28/2022     3 DOS: the patient was seen and examined on 01/31/2022   Brief hospital course: Ms. Whitney Bernard is a 61 year old female with history of pancreatitis, CKD, CAD with prior history of MI, depression, hyperlipidemia, hypertension, rheumatoid arthritis, COPD, non-insulin-dependent diabetes mellitus type 2, who presented to the ED on 01/28/2022 for evaluation of abdominal pain and diarrhea.  Patient was tachypneic, tachycardic, hypotensive but afebrile in the ED. BP responded well to IV fluids.   Labs notable for Na 129, K 7.5, Cr 3.74. High anion gap metabolic acidosis.  WBC was 27.8k.    Hyperkalemia was aggressively treated and improved. Started on IV fluids and sodium level improved. EDP discussed case with nephrologist who recommends 1 more dose of Lokelma, starting sodium bicarb and dextrose infusion. Treated in the ED with metronidazole IV, cefepime, morphine 4 mg IV, fentanyl 50 mcg IV.  Admitted to medicine service for further evaluation and management. Stool studies were ordered to determine if infectious etiology.  Assessment and Plan: * Hyperkalemia Initial K was 7.5, treated aggressively in the ED. Due to renal failure. K normalized, 4.8 this AM. Monitor BMP.  Normal anion gap metabolic acidosis Initially in the setting of renal failure and diarrhea.  Patient is off bicarb drip.  Placed on oral bicarb yesterday, today with improvement. Bicarb 18, up from 14.  pH is compensated. --Continue oral bicarb supplement today and repeat BMP tomorrow  Adynamic ileus ([place]) Abdominal x-ray obtained today 12/15 due to progressive abdominal distention.  It showed diffuse small bowel distention consistent with adynamic ileus.  Suspect caused by patient taking excessive Imodium. -- Revert diet to clear liquids if worsened distention or abdominal pain after lunch today --Bowel regimen per orders --Monitor  abdominal exam and bowel function closely  UTI (urinary tract infection) Urine culture on admission with pan-sensitive E coli.  UTI could explain patient's symptoms, but seem out of proportion, concern for acute GI illness / infection. -- Continue IV ampicillin   Abdominal pain Abdominal distention. Ileus CT abd/pelvis was unrevealing as to cause. ? If developing ascites. Pt denies hx of liver disease. Abdominal ultrasound with trace ascites but no acute findings Supportive care. Stool studies canceled as patient no longer having diarrhea, now with ileus On antibiotics for UTI.  Hypomagnesemia Mg 1.4 on 12/14 was replaced. Monitor Mg, replace PRN  Severe sepsis ([place]) Pt met SIRS criteria on admission with tachycardia, leukocytosis, organ involvement is renal and respiratory.   Found to have UTI --Started antibiotics for UTI (12/14)   AKI (acute kidney injury) ([place]) History of CKD stage 3a Suspect prerenal azotemia given diarrhea and hypotension. Nephrology consulted in the ED. Off bicarb drip. Off IV fluids Avoid nephrotoxins, hypotension, IV contrast. Monitor BMP daily.   Diarrhea Presented with marked leukocytosis and acute kidney injury. High suspicion for C diff or infectious etiology. Reha resolved chest after admission -- Canceled C diff and GI pathogen panel --Avoid anti-diarrheals given ileus  Sore throat With dry mucosa.  Pharynx difficult to visulaize.  --Started on Nystatin empirically on admisison - Cepacol (lozenges) as needed for sore throat   Generalized weakness Due to acute illness, hypotension. PT evaluation if needed  Elevated troponin Due to demand ischemia in setting of hypotension, AKI. No chest pain or acute changes on EKG.  GERD (gastroesophageal reflux disease) Appears not on PPI or H2 blocker.  COPD (chronic obstructive pulmonary disease) (HCC) Stable, not  exacerbated.  Bronchodilators  Depression with anxiety Continue home  fluoxetine, alprazolam PRN  Hypertension Presented hypotensive. Holding home antihypertensives PRN hydralazine for now  Type II diabetes mellitus with renal manifestations (HCC) Non-insulin-dependent. A1c pending. Hold metformin due to AKI Sliding scale Novolog  Protein-calorie malnutrition, severe Dietitian consulted  Hypokalemia Initially with hyperkalemia in setting of AKI. K today 3.4.   Replacing 40 mEq oral K-Cl Monitor BMP        Subjective: Patient awake resting in bed when seen on rounds today.  She has ongoing generalized abdominal pain but denies nausea vomiting.  Is passing gas but has not had any bowel movement past couple of days.  Diarrhea resolved just after admission.  RN today reported that patient's husband informed her patient had taken between 63 and 30 tablets of Imodium in the a couple of days prior to admission.  Physical Exam: Vitals:   01/30/22 1402 01/30/22 1715 01/30/22 2113 01/31/22 0810  BP: (!) 168/110 (!) 163/101 (!) 159/96 (!) 158/97  Pulse: (!) 105 (!) 110 (!) 110 (!) 107  Resp: 18  20   Temp: 97.9 F (36.6 C) (!) 97.5 F (36.4 C) 97.7 F (36.5 C) 97.7 F (36.5 C)  TempSrc:      SpO2: 100% 100% 100% 100%  Weight:      Height:       General exam: awake, alert, no acute distress HEENT: moist mucus membranes, hearing grossly normal  Respiratory system: On room air, normal respiratory effort. Cardiovascular system: RRR, no pedal edema.   Gastrointestinal system: Abdomen remains distended, mildly tender but improved, unable to appreciate bowel sounds auscultation. Central nervous system: A&O x3. no gross focal neurologic deficits, normal speech Extremities: moves all, no edema, normal tone Skin: dry, intact, normal temperature Psychiatry: normal mood, congruent affect, judgement and insight appear normal  Data Reviewed:  Labs notable for --  VBG pH 7.38, pCO2 low 33, pO2 mildly low at 56, 19.5.  BMP with normal sodium, potassium  3.4, bicarb improved from 14 up to 18, glucose 154, BUN 30, creatinine improved 1.14, calcium 8.8.  Procalcitonin 0.21.  CBC with leukocytosis 21.2 up slightly, platelets 509  Abdominal ultrasound --negative for acute findings, trace ascites, prior cholecystectomy  Abdominal xray today -- adynamic ileus  Urine culture grew pan-sensitive E. Coli  Cancelled --- C diff and GI pathogen panel - no diarrhea since day of admission    Family Communication: None  Disposition: Status is: Inpatient Remains inpatient appropriate because: Persistent abdmoinal pain and diarrhea, ongoing evaluation, requiring IV medications.   Planned Discharge Destination: Home    Time spent: 40 minutes  Author: Ezekiel Slocumb, DO 01/31/2022 2:24 PM  For on call review [email or web address].

## 2022-01-31 NOTE — Plan of Care (Signed)
  Problem: Education: Goal: Knowledge of General Education information will improve Description: Including pain rating scale, medication(s)/side effects and non-pharmacologic comfort measures Outcome: Progressing   Problem: Health Behavior/Discharge Planning: Goal: Ability to manage health-related needs will improve Outcome: Progressing   Problem: Clinical Measurements: Goal: Diagnostic test results will improve Outcome: Progressing   Problem: Nutrition: Goal: Adequate nutrition will be maintained Outcome: Progressing   Problem: Coping: Goal: Level of anxiety will decrease Outcome: Progressing   Problem: Pain Managment: Goal: General experience of comfort will improve Outcome: Progressing   

## 2022-01-31 NOTE — Progress Notes (Signed)
       CROSS COVER NOTE  NAME: Whitney Bernard MRN: [id number] DOB : 04/18/1960   HPI/Events of Note   Nurse reports patient is miserable with abdominal pain. Previously ordered morphine no longer available  Assessment and  Interventions   Assessment: Serious acidosis on chem panel yesterday. No longer on bicarb. No prior blood gas and Mag of 1.4 yesterday replaced 4 gms Plan: Added VBG, MAG BMP and CBC with diff for this morning stat Morphine 2 mg for 3 doses reordered     Donnie Mesa NP [facility]

## 2022-02-01 ENCOUNTER — Encounter: Payer: Self-pay | Admitting: Internal Medicine

## 2022-02-01 ENCOUNTER — Inpatient Hospital Stay: Payer: 59

## 2022-02-01 DIAGNOSIS — R1084 Generalized abdominal pain: Secondary | ICD-10-CM | POA: Diagnosis not present

## 2022-02-01 DIAGNOSIS — E876 Hypokalemia: Secondary | ICD-10-CM

## 2022-02-01 DIAGNOSIS — K560 Paralytic ileus: Secondary | ICD-10-CM | POA: Diagnosis not present

## 2022-02-01 DIAGNOSIS — E872 Acidosis, unspecified: Secondary | ICD-10-CM | POA: Diagnosis not present

## 2022-02-01 LAB — CBC
HCT: 38.2 % (ref 36.0–46.0)
Hemoglobin: 11.7 g/dL — ABNORMAL LOW (ref 12.0–15.0)
MCH: 25.5 pg — ABNORMAL LOW (ref 26.0–34.0)
MCHC: 30.6 g/dL (ref 30.0–36.0)
MCV: 83.2 fL (ref 80.0–100.0)
Platelets: 426 10*3/uL — ABNORMAL HIGH (ref 150–400)
RBC: 4.59 MIL/uL (ref 3.87–5.11)
RDW: 16.2 % — ABNORMAL HIGH (ref 11.5–15.5)
WBC: 17.4 10*3/uL — ABNORMAL HIGH (ref 4.0–10.5)
nRBC: 0 % (ref 0.0–0.2)

## 2022-02-01 LAB — BASIC METABOLIC PANEL
Anion gap: 6 (ref 5–15)
BUN: 30 mg/dL — ABNORMAL HIGH (ref 8–23)
CO2: 20 mmol/L — ABNORMAL LOW (ref 22–32)
Calcium: 8.6 mg/dL — ABNORMAL LOW (ref 8.9–10.3)
Chloride: 109 mmol/L (ref 98–111)
Creatinine, Ser: 1.02 mg/dL — ABNORMAL HIGH (ref 0.44–1.00)
GFR, Estimated: >60 mL/min (ref >60)
Glucose, Bld: 112 mg/dL — ABNORMAL HIGH (ref 70–99)
Potassium: 3.4 mmol/L — ABNORMAL LOW (ref 3.5–5.1)
Sodium: 135 mmol/L (ref 135–145)

## 2022-02-01 LAB — GLUCOSE, CAPILLARY
Glucose-Capillary: 102 mg/dL — ABNORMAL HIGH (ref 70–99)
Glucose-Capillary: 116 mg/dL — ABNORMAL HIGH (ref 70–99)
Glucose-Capillary: 130 mg/dL — ABNORMAL HIGH (ref 70–99)

## 2022-02-01 LAB — PHOSPHORUS: Phosphorus: 3.6 mg/dL (ref 2.5–4.6)

## 2022-02-01 MED ORDER — CARVEDILOL 3.125 MG PO TABS
6.2500 mg | ORAL_TABLET | Freq: Two times a day (BID) | ORAL | Status: DC
  Administered 2022-02-01 – 2022-02-02 (×3): 6.25 mg via ORAL
  Filled 2022-02-01 (×3): qty 2

## 2022-02-01 MED ORDER — SODIUM CHLORIDE 0.9 % IV SOLN
1.0000 g | Freq: Four times a day (QID) | INTRAVENOUS | Status: AC
  Administered 2022-02-01 – 2022-02-02 (×5): 1 g via INTRAVENOUS
  Filled 2022-02-01 (×5): qty 1000

## 2022-02-01 MED ORDER — POTASSIUM CHLORIDE CRYS ER 20 MEQ PO TBCR
40.0000 meq | EXTENDED_RELEASE_TABLET | Freq: Once | ORAL | Status: AC
  Administered 2022-02-01: 40 meq via ORAL
  Filled 2022-02-01: qty 2

## 2022-02-01 NOTE — Progress Notes (Addendum)
Progress Note   Patient: Whitney Bernard VWU:[id number] DOB: [birth year] DOA: 01/28/2022     4 DOS: the patient was seen and examined on 02/01/2022   Brief hospital course: Ms. Carisma Troupe is a 61 year old female with history of pancreatitis, CKD, CAD with prior history of MI, depression, hyperlipidemia, hypertension, rheumatoid arthritis, COPD, non-insulin-dependent diabetes mellitus type 2, who presented to the ED on 01/28/2022 for evaluation of abdominal pain and diarrhea.  Patient was tachypneic, tachycardic, hypotensive but afebrile in the ED. BP responded well to IV fluids.   Labs notable for Na 129, K 7.5, Cr 3.74. High anion gap metabolic acidosis.  WBC was 27.8k.    Hyperkalemia was aggressively treated and improved. Started on IV fluids and sodium level improved. EDP discussed case with nephrologist who recommends 1 more dose of Lokelma, starting sodium bicarb and dextrose infusion. Treated in the ED with metronidazole IV, cefepime, morphine 4 mg IV, fentanyl 50 mcg IV.  Admitted to medicine service for further evaluation and management. Stool studies were ordered to determine if infectious etiology.  Assessment and Plan: * Adynamic ileus ([place]) Abdominal x-ray obtained today 12/15 due to progressive abdominal distention.  It showed diffuse small bowel distention consistent with adynamic ileus.  Suspect caused by patient taking excessive Imodium. 12/16: upright KUB shows persistent to slightly worsened small bowel dilated loops, ileus vs SBO -- Clear liquid diet for now --Bowel regimen per orders --Monitor abdominal exam and bowel function closely --Will consult general surgery  Normal anion gap metabolic acidosis Initially in the setting of renal failure and diarrhea.  Patient is off bicarb drip.  Placed on oral bicarb yesterday, today with improvement. 12/15: bicarb 18, up from 14.  pH is compensated. 12/16: bicarb improved to 20 --Hold further oral bicarb supplement today    UTI (urinary tract infection) Urine culture on admission with pan-sensitive E coli.  UTI could explain patient's symptoms, but seem out of proportion, concern for acute GI illness / infection. -- Continue IV ampicillin   Abdominal pain Abdominal distention due to Ileus Initial CT abd/pelvis was unrevealing as to cause.  Concern for ascites but no hx of liver disease, only trace ascites on U/S. Found to have ileus. Stool studies canceled as patient no longer having diarrhea. Now with ileus - mgmt as outlined. On antibiotics for UTI.  Hypomagnesemia Mg 1.4 on 12/14 was replaced. Monitor Mg, replace PRN  Severe sepsis ([place]) Pt met SIRS criteria on admission with tachycardia, leukocytosis, organ involvement is renal and respiratory.   Found to have UTI --Started antibiotics for UTI (12/14)   AKI (acute kidney injury) ([place]) History of CKD stage 3a AKI now resolved. Suspect prerenal azotemia given diarrhea and hypotension.  Nephrology consulted in the ED. Off bicarb drip.  Off IV fluids. Avoid nephrotoxins, hypotension, IV contrast. Monitor BMP daily.   Diarrhea Presented with marked leukocytosis and acute kidney injury. High suspicion for C diff or infectious etiology. Reha resolved chest after admission -- Canceled C diff and GI pathogen panel --Avoid anti-diarrheals given ileus  Hyperkalemia-resolved as of 02/01/2022 Initial K was 7.5, treated aggressively in the ED. Due to renal failure. K normalized, 4.8 this AM. Monitor BMP.  Sore throat With dry mucosa.  Pharynx difficult to visulaize.  --Started on Nystatin empirically on admisison - Cepacol (lozenges) as needed for sore throat   Generalized weakness Due to acute illness, hypotension. PT evaluation if needed  Elevated troponin Due to demand ischemia in setting of hypotension, AKI. No chest  pain or acute changes on EKG.  GERD (gastroesophageal reflux disease) Appears not on PPI or H2 blocker.  COPD  (chronic obstructive pulmonary disease) (HCC) Stable, not exacerbated.  Bronchodilators  Depression with anxiety Continue home fluoxetine, alprazolam PRN  Hypertension Presented hypotensive, so home antihypertensives were held. BP's have now been elevated, in setting of persistent abdominal pain / ileus. --hold home lisinopril, Lasix while using Toradol --start Coreg 6.25 mg BID --PRN hydralazine   Type II diabetes mellitus with renal manifestations (HCC) Non-insulin-dependent. A1c pending. Hold metformin due to AKI Sliding scale Novolog  Protein-calorie malnutrition, severe Dietitian consulted  Hypokalemia Initially with hyperkalemia in setting of AKI. K again today 3.4, despite replacement yesterday.   Replacing again today with 40 mEq oral K-Cl Monitor BMP        Subjective: Patient awake resting in bed when seen on rounds today.  She reports feeling slightly better today.  Passing gas but still no BM.  No nausea/vomiting and tolerating meals okay.  No other acute complaints.   Physical Exam: Vitals:   02/01/22 0023 02/01/22 0214 02/01/22 0416 02/01/22 0806  BP: (!) 188/93 (!) 183/88 (!) 179/107 (!) 165/100  Pulse: 91 77 98 84  Resp: _0 Temp: (!) 97.5 F (36.4 C) 97.9 F (36.6 C)  97.8 F (36.6 C)  TempSrc:      SpO2: 100% 100% 99% 100%  Weight:      Height:       General exam: awake, alert, no acute distress HEENT: moist mucus membranes, hearing grossly normal  Respiratory system: On room air, normal respiratory effort. Cardiovascular system: RRR, no pedal edema.   Gastrointestinal system: distended abdomen, somewhat less tender on palpation, unable to appreciate bowel sounds Central nervous system: A&O x3. no gross focal neurologic deficits, normal speech Extremities: moves all, no edema, normal tone Psychiatry: normal mood, congruent affect, judgement and insight appear normal  Data Reviewed:  Labs notable for --   K 3.4, bicarb 20 from  18, glucose 112, BUN 30, Cr improved 1.02, WBC improved 17.4k, Hbg 11.7, platelets 426k  Abdominal ultrasound --negative for acute findings, trace ascites, prior cholecystectomy  Abdominal xrays -- 12/15 -- adynamic ileus -- 12/16 -- persistent to slightly worsened dilation of small bowel loops  Urine culture grew pan-sensitive E. Coli  Cancelled --- C diff and GI pathogen panel - no diarrhea since day of admission    Family Communication: None  Disposition: Status is: Inpatient Remains inpatient appropriate because: Persistent abdmoinal pain and diarrhea, ongoing evaluation, requiring IV medications.   Planned Discharge Destination: Home    Time spent: 40 minutes  Author: Ezekiel Slocumb, DO 02/01/2022 1:40 PM  For on call review [email or web address].

## 2022-02-01 NOTE — Progress Notes (Signed)
PHARMACY NOTE:  ANTIMICROBIAL RENAL DOSAGE ADJUSTMENT  Current antimicrobial regimen includes a mismatch between antimicrobial dosage and estimated renal function.  As per policy approved by the Pharmacy & Therapeutics and Medical Executive Committees, the antimicrobial dosage will be adjusted accordingly.  Current antimicrobial dosage:  ampicillin 1 gram IV every 8 hours  Indication: UTI  Renal Function:  Estimated Creatinine Clearance: 55.8 mL/min (A) (by C-G formula based on SCr of 1.02 mg/dL (H)).    Antimicrobial dosage has been changed to:  ampicillin 1 gram IV every 6 hours   Thank you for allowing pharmacy to be a part of this patient's care.  Lowella Bandy, [facility] 02/01/2022 7:54 AM

## 2022-02-01 NOTE — Consult Note (Signed)
Date of Consultation:  02/01/2022  Requesting Physician:  Esaw Grandchild, DO  Reason for Consultation:  Ileus  History of Present Illness: Whitney Bernard is a 61 y.o. female admitted on 01/28/22 with abdominal pain and diarrhea.  She has history of pancreatitis, CKD, CAD with prior MI, depression HLP, HTN, RA, COPD, DM.  CT scan on 12/12 did not show any evidence of bowel obstruction and thought was a diarrheal illness process.  She had taken imodium at home and her diarrhea stopped in the hospital.  However, afterwards, she started developing abdominal distention and her last bowel movement was 2 days ago.  She's still having flatus and denies any nausea, but she still has abdominal pain which is diffuse.  KUB has been obtained yesterday and today, which show progression of dilated loops of small bowel concerning for ileus, though SBO could not be ruled out.   Past Medical History: Past Medical History:  Diagnosis Date   Acute pancreatitis    CAD (coronary artery disease)    a. 08/2017 Neg MV; b. 01/2019 CTA Chest: Mild cor Ca2+; c. 02/2019 NSTEMI/PCI: LCX 95p/m (2.75x15 Resolute Onyx DES); d. 05/2019 Cath: LM nl, LAD nl, LCX patent stent, RCA 30ost/p-->Med rx.   Chronic pain syndrome    CKD (chronic kidney disease), stage III (HCC)    COPD (chronic obstructive pulmonary disease) (HCC)    Depression    Diabetes mellitus without complication (HCC)    Headache(784.0)    migraines   History of echocardiogram    a. 04/2018 Echo: EF 55-60%. No significant valvular dzs; b. 02/2019 Echo: EF 60-65%. Nl RV fxn.   Hypercholesteremia    Hyperkalemia    Hypertension    Hyperthyroidism    Hypokalemia    Migraine    Narcotic abuse (HCC)    RA (rheumatoid arthritis) (HCC)      Past Surgical History: Past Surgical History:  Procedure Laterality Date   ANTERIOR CERVICAL DECOMP/DISCECTOMY FUSION N/A 11/26/2012   Procedure: ANTERIOR CERVICAL DECOMPRESSION/DISCECTOMY FUSION 2 LEVELS;  Surgeon: Reinaldo Meeker, MD;  Location: [facility];  Service: Neurosurgery;  Laterality: N/A;  ANTERIOR CERVICAL DECOMPRESSION/DISCECTOMY FUSION 2 LEVELS   CERVICAL POLYPECTOMY     CHOLECYSTECTOMY     CORONARY STENT INTERVENTION N/A 02/23/2019   Procedure: CORONARY STENT INTERVENTION;  Surgeon: Yvonne Kendall, MD;  Location: [facility] INVASIVE CV LAB;  Service: Cardiovascular;  Laterality: N/A;   ESOPHAGOGASTRODUODENOSCOPY N/A 12/26/2014   Procedure: ESOPHAGOGASTRODUODENOSCOPY (EGD);  Surgeon: Midge Minium, MD;  Location: [facility] ENDOSCOPY;  Service: Endoscopy;  Laterality: N/A;   FRACTURE SURGERY     LEFT HEART CATH AND CORONARY ANGIOGRAPHY N/A 02/23/2019   Procedure: LEFT HEART CATH AND CORONARY ANGIOGRAPHY;  Surgeon: Antonieta Iba, MD;  Location: [facility] INVASIVE CV LAB;  Service: Cardiovascular;  Laterality: N/A;   LEFT HEART CATH AND CORONARY ANGIOGRAPHY N/A 06/06/2019   Procedure: LEFT HEART CATH AND CORONARY ANGIOGRAPHY;  Surgeon: Iran Ouch, MD;  Location: [facility] INVASIVE CV LAB;  Service: Cardiovascular;  Laterality: N/A;   NECK SURGERY     ORIF WRIST FRACTURE Right 03/28/2020   Procedure: OPEN REDUCTION INTERNAL FIXATION OF RIGHT DISTAL RADIUS FRACTURE;  Surgeon: Christena Flake, MD;  Location: [facility] ORS;  Service: Orthopedics;  Laterality: Right;   TUBAL LIGATION     WISDOM TOOTH EXTRACTION      Home Medications: Prior to Admission medications   Medication Sig Start Date End Date Taking? Authorizing Provider  ALPRAZolam Prudy Feeler) 0.25 MG tablet Take 1  tablet by mouth 2 times every day 11/14/21  Yes   ARIPiprazole (ABILIFY) 2 MG tablet Take 0.5 tablets (1 mg total) by mouth daily for 2 weeks, then increase to 1 tablet daily 01/16/22  Yes   aspirin EC 81 MG EC tablet Take 1 tablet (81 mg total) by mouth daily. 06/07/19  Yes Lurene Shadow, MD  butalbital-acetaminophen-caffeine (FIORICET) [phone] MG tablet take 1 - 2 tablet by oral route  every 4 hours as needed not to exceed 6 tablets per 24hrs 09/10/21  Yes    FLUoxetine (PROZAC) 20 MG capsule Take 1 capsule by mouth every day in the morning 09/18/21  Yes   furosemide (LASIX) 40 MG tablet TAKE 1 TABLET BY MOUTH TWICE DAILY 05/11/21  Yes   ibuprofen (ADVIL) 200 MG tablet Take 600 mg by mouth every 4 (four) hours as needed for mild pain or moderate pain.   Yes [provider]  potassium chloride SA (KLOR-CON) 20 MEQ tablet Take 1 tablet (20 mEq total) by mouth 2 (two) times daily. 06/07/19  Yes Lurene Shadow, MD  ALPRAZolam Prudy Feeler) 0.5 MG tablet take 1 tablet by oral route 2 times every day Patient not taking: Reported on 01/28/2022 06/27/21     busPIRone (BUSPAR) 10 MG tablet take 1 tablet by oral route 3 times every day Patient not taking: Reported on 01/28/2022 11/08/20     carvedilol (COREG) 6.25 MG tablet Take 1 tablet (6.25 mg total) by mouth 2 (two) times daily. 06/19/21 07/20/21  Leeroy Bock, MD  carvedilol (COREG) 6.25 MG tablet Take 1 tablet (6.25 mg total) by mouth 2 (two) times daily. Patient not taking: Reported on 01/28/2022 09/18/21     lisinopril (ZESTRIL) 20 MG tablet take 1 tablet by oral route  every day Patient not taking: Reported on 01/28/2022 07/09/20     metFORMIN (GLUCOPHAGE) 500 MG tablet take 1 tablet by mouth every day with a meal Patient not taking: Reported on 01/28/2022 07/05/20     Multiple Vitamins-Minerals (MULTIVITAMIN WITH MINERALS) tablet Take 1 tablet by mouth daily. Patient not taking: Reported on 01/28/2022    [provider]  potassium chloride SA (KLOR-CON M) 20 MEQ tablet Take by mouth. Patient not taking: Reported on 01/28/2022 06/28/21     spironolactone (ALDACTONE) 25 MG tablet Take 0.5 tablets (12.5 mg total) by mouth daily. 06/20/21 07/20/21  Leeroy Bock, MD    Allergies: Allergies  Allergen Reactions   Almond Oil Hives   Atorvastatin Other (See Comments)    myalgias   Rosuvastatin Other (See Comments)    Muscle pain    Social History:  reports that she quit smoking about 9  years ago. Her smoking use included cigarettes. She has a 19.00 pack-year smoking history. She has never used smokeless tobacco. She reports that she does not currently use alcohol. She reports that she does not use drugs.   Family History: Family History  Problem Relation Age of Onset   CAD Father    Alzheimer's disease Other    Breast cancer Paternal Aunt        another aunt- Polycythemia vera.    Breast cancer Cousin    Lung cancer Paternal Uncle     Review of Systems: Review of Systems  Constitutional:  Negative for chills and fever.  Respiratory:  Negative for shortness of breath.   Cardiovascular:  Negative for chest pain.  Gastrointestinal:  Positive for abdominal pain, constipation and diarrhea. Negative for nausea and vomiting.  Genitourinary:  Negative for dysuria.    Physical Exam BP (!) 179/93 (BP Location: Right Arm)   Pulse 70   Temp (!) 97.4 F (36.3 C)   Resp 19   Ht 5\' 4"  (1.626 m)   Wt 74.4 kg   SpO2 100%   BMI 28.15 kg/m  CONSTITUTIONAL: No acute distress HEENT:  Normocephalic, atraumatic, extraocular motion intact. RESPIRATORY:  Normal respiratory effort without pathologic use of accessory muscles. CARDIOVASCULAR: Regular rhythm and rate. GI: The abdomen is soft, distended, tympanic to percussion, with diffuse mild tenderness.  No peritonitis.  MUSCULOSKELETAL:  Normal muscle strength and tone in all four extremities.  No peripheral edema or cyanosis. NEUROLOGIC:  Motor and sensation is grossly normal.  Cranial nerves are grossly intact. PSYCH:  Alert and oriented to person, place and time. Affect is normal.  Laboratory Analysis: Results for orders placed or performed during the hospital encounter of 01/28/22 (from the past 24 hour(s))  Glucose, capillary     Status: Abnormal   Collection Time: 01/31/22  5:03 PM  Result Value Ref Range   Glucose-Capillary 144 (H) 70 - 99 mg/dL  Glucose, capillary     Status: None   Collection Time: 01/31/22  8:30  PM  Result Value Ref Range   Glucose-Capillary 94 70 - 99 mg/dL   Comment 1 Notify RN   Basic metabolic panel     Status: Abnormal   Collection Time: 02/01/22  6:13 AM  Result Value Ref Range   Sodium 135 135 - 145 mmol/L   Potassium 3.4 (L) 3.5 - 5.1 mmol/L   Chloride 109 98 - 111 mmol/L   CO2 20 (L) 22 - 32 mmol/L   Glucose, Bld 112 (H) 70 - 99 mg/dL   BUN 30 (H) 8 - 23 mg/dL   Creatinine, Ser 02/03/22 (H) 0.44 - 1.00 mg/dL   Calcium 8.6 (L) 8.9 - 10.3 mg/dL   GFR, Estimated 2.42 >68 mL/min   Anion gap 6 5 - 15  Phosphorus     Status: None   Collection Time: 02/01/22  6:13 AM  Result Value Ref Range   Phosphorus 3.6 2.5 - 4.6 mg/dL  CBC     Status: Abnormal   Collection Time: 02/01/22  6:13 AM  Result Value Ref Range   WBC 17.4 (H) 4.0 - 10.5 K/uL   RBC 4.59 3.87 - 5.11 MIL/uL   Hemoglobin 11.7 (L) 12.0 - 15.0 g/dL   HCT 02/03/22 19.6 - 22.2 %   MCV 83.2 80.0 - 100.0 fL   MCH 25.5 (L) 26.0 - 34.0 pg   MCHC 30.6 30.0 - 36.0 g/dL   RDW 97.9 (H) 89.2 - 11.9 %   Platelets 426 (H) 150 - 400 K/uL   nRBC 0.0 0.0 - 0.2 %  Glucose, capillary     Status: Abnormal   Collection Time: 02/01/22  8:09 AM  Result Value Ref Range   Glucose-Capillary 102 (H) 70 - 99 mg/dL  Glucose, capillary     Status: Abnormal   Collection Time: 02/01/22 11:20 AM  Result Value Ref Range   Glucose-Capillary 116 (H) 70 - 99 mg/dL    Imaging: DG Abd 1 View  Result Date: 02/01/2022 CLINICAL DATA:  Abdominal pain, ileus EXAM: ABDOMEN - 1 VIEW COMPARISON:  01/21/2022 FINDINGS: There are numerous dilated loops of small bowel throughout the abdomen similar to slightly increased in caliber compared to the previous day's study. Multiple air-fluid levels. No gross free intraperitoneal air. IMPRESSION: Persistent dilated loops of small  bowel throughout the abdomen similar to slightly increased in caliber compared to the previous day's study. Although this may be secondary to ileus, small bowel obstruction is a concern.  Consider further evaluation with CT or small-bowel follow-through. Electronically Signed   By: Duanne Guess D.O.   On: 02/01/2022 09:14    Assessment and Plan: This is a 61 y.o. female with ileus vs possible SBO  --Patient is distended and with tenderness but no nausea/vomiting and is still having flatus.  I think this is likely ileus related to taking imodium for her diarrhea.  As a precaution, would recommend backing her diet down to clear liquids and continue with her bowel regimen medications to re-stimulate her bowels.  --Will reassess tomorrow with KUB vs CT scan depending on her clinical picture.  I spent 45 minutes dedicated to the care of this patient on the date of this encounter to include pre-visit review of records, face-to-face time with the patient discussing diagnosis and management, and any post-visit coordination of care.   Howie Ill, MD [facility]:  [phone]

## 2022-02-02 ENCOUNTER — Inpatient Hospital Stay: Payer: 59

## 2022-02-02 ENCOUNTER — Encounter: Payer: Self-pay | Admitting: Internal Medicine

## 2022-02-02 DIAGNOSIS — K560 Paralytic ileus: Secondary | ICD-10-CM | POA: Diagnosis not present

## 2022-02-02 DIAGNOSIS — R531 Weakness: Secondary | ICD-10-CM | POA: Diagnosis not present

## 2022-02-02 DIAGNOSIS — R1084 Generalized abdominal pain: Secondary | ICD-10-CM | POA: Diagnosis not present

## 2022-02-02 LAB — CBC
HCT: 35.1 % — ABNORMAL LOW (ref 36.0–46.0)
Hemoglobin: 10.8 g/dL — ABNORMAL LOW (ref 12.0–15.0)
MCH: 26 pg (ref 26.0–34.0)
MCHC: 30.8 g/dL (ref 30.0–36.0)
MCV: 84.4 fL (ref 80.0–100.0)
Platelets: 394 10*3/uL (ref 150–400)
RBC: 4.16 MIL/uL (ref 3.87–5.11)
RDW: 15.8 % — ABNORMAL HIGH (ref 11.5–15.5)
WBC: 18.4 10*3/uL — ABNORMAL HIGH (ref 4.0–10.5)
nRBC: 0 % (ref 0.0–0.2)

## 2022-02-02 LAB — MAGNESIUM: Magnesium: 1.6 mg/dL — ABNORMAL LOW (ref 1.7–2.4)

## 2022-02-02 LAB — GLUCOSE, CAPILLARY
Glucose-Capillary: 104 mg/dL — ABNORMAL HIGH (ref 70–99)
Glucose-Capillary: 174 mg/dL — ABNORMAL HIGH (ref 70–99)
Glucose-Capillary: 153 mg/dL — ABNORMAL HIGH (ref 70–99)
Glucose-Capillary: 135 mg/dL — ABNORMAL HIGH (ref 70–99)

## 2022-02-02 LAB — CULTURE, BLOOD (ROUTINE X 2)
Culture: NO GROWTH
Culture: NO GROWTH
Special Requests: ADEQUATE

## 2022-02-02 LAB — BASIC METABOLIC PANEL
Anion gap: 6 (ref 5–15)
BUN: 26 mg/dL — ABNORMAL HIGH (ref 8–23)
CO2: 20 mmol/L — ABNORMAL LOW (ref 22–32)
Calcium: 8.1 mg/dL — ABNORMAL LOW (ref 8.9–10.3)
Chloride: 107 mmol/L (ref 98–111)
Creatinine, Ser: 0.97 mg/dL (ref 0.44–1.00)
GFR, Estimated: >60 mL/min (ref >60)
Glucose, Bld: 146 mg/dL — ABNORMAL HIGH (ref 70–99)
Potassium: 3.5 mmol/L (ref 3.5–5.1)
Sodium: 133 mmol/L — ABNORMAL LOW (ref 135–145)

## 2022-02-02 MED ORDER — CARVEDILOL 12.5 MG PO TABS
12.5000 mg | ORAL_TABLET | Freq: Two times a day (BID) | ORAL | Status: DC
  Administered 2022-02-02 – 2022-02-05 (×6): 12.5 mg via ORAL
  Filled 2022-02-02 (×6): qty 1

## 2022-02-02 MED ORDER — MAGNESIUM SULFATE 2 GM/50ML IV SOLN
2.0000 g | Freq: Once | INTRAVENOUS | Status: AC
  Administered 2022-02-02: 2 g via INTRAVENOUS
  Filled 2022-02-02: qty 50

## 2022-02-02 MED ORDER — IOHEXOL 9 MG/ML PO SOLN
500.0000 mL | ORAL | Status: AC
  Administered 2022-02-02 (×2): 500 mL via ORAL

## 2022-02-02 MED ORDER — POTASSIUM CHLORIDE CRYS ER 20 MEQ PO TBCR
40.0000 meq | EXTENDED_RELEASE_TABLET | Freq: Once | ORAL | Status: AC
  Administered 2022-02-02: 40 meq via ORAL
  Filled 2022-02-02: qty 2

## 2022-02-02 MED ORDER — MORPHINE SULFATE (PF) 2 MG/ML IV SOLN
2.0000 mg | INTRAVENOUS | Status: AC | PRN
  Administered 2022-02-02 – 2022-02-03 (×6): 2 mg via INTRAVENOUS
  Filled 2022-02-02 (×6): qty 1

## 2022-02-02 MED ORDER — ACETAMINOPHEN 650 MG RE SUPP: 650.0000 mg | Freq: Four times a day (QID) | RECTAL | Status: AC | PRN

## 2022-02-02 MED ORDER — METRONIDAZOLE 500 MG/100ML IV SOLN
500.0000 mg | Freq: Two times a day (BID) | INTRAVENOUS | Status: DC
  Administered 2022-02-02 – 2022-02-05 (×6): 500 mg via INTRAVENOUS
  Filled 2022-02-02 (×7): qty 100

## 2022-02-02 MED ORDER — ACETAMINOPHEN 325 MG PO TABS
650.0000 mg | ORAL_TABLET | Freq: Four times a day (QID) | ORAL | Status: AC | PRN
  Administered 2022-02-02: 650 mg via ORAL
  Filled 2022-02-02 (×2): qty 2

## 2022-02-02 MED ORDER — IOHEXOL 300 MG/ML  SOLN
100.0000 mL | Freq: Once | INTRAMUSCULAR | Status: AC | PRN
  Administered 2022-02-02: 100 mL via INTRAVENOUS

## 2022-02-02 MED ORDER — CIPROFLOXACIN IN D5W 400 MG/200ML IV SOLN
400.0000 mg | Freq: Two times a day (BID) | INTRAVENOUS | Status: DC
  Administered 2022-02-02 – 2022-02-05 (×6): 400 mg via INTRAVENOUS
  Filled 2022-02-02 (×7): qty 200

## 2022-02-02 NOTE — Progress Notes (Addendum)
Progress Note   Patient: Whitney Bernard SEG:[id number] DOB: 1961-02-06 DOA: 01/28/2022     5 DOS: the patient was seen and examined on 02/02/2022   Brief hospital course: Ms. Whitney Bernard is a 61 year old female with history of pancreatitis, CKD, CAD with prior history of MI, depression, hyperlipidemia, hypertension, rheumatoid arthritis, COPD, non-insulin-dependent diabetes mellitus type 2, who presented to the ED on 01/28/2022 for evaluation of abdominal pain and diarrhea.  Patient was tachypneic, tachycardic, hypotensive but afebrile in the ED. BP responded well to IV fluids.   Labs notable for Na 129, K 7.5, Cr 3.74. High anion gap metabolic acidosis.  WBC was 27.8k.    Hyperkalemia was aggressively treated and improved. Started on IV fluids and sodium level improved. EDP discussed case with nephrologist who recommends 1 more dose of Lokelma, starting sodium bicarb and dextrose infusion. Treated in the ED with metronidazole IV, cefepime, morphine 4 mg IV, fentanyl 50 mcg IV.  Admitted to medicine service for further evaluation and management. Stool studies were ordered to determine if infectious etiology.  Assessment and Plan:  12/17 afternoon addendum -- CT results reviewed, discussed with surgery team.  Pt has diffuse wall thickening at the terminal ileum concerning for an infectious enteritis.  Starting Cipro/Flagyl.  * Adynamic ileus ([place]) Abdominal x-ray obtained today 12/15 due to progressive abdominal distention.  It showed diffuse small bowel distention consistent with adynamic ileus.  Suspect caused by patient taking excessive Imodium. 12/16: upright KUB shows persistent to slightly worsened small bowel dilated loops, ileus vs SBO 12/17: surgery repeating CT scan today given return of bowel function but persistent pain and distention -- Clear liquid diet for now, advancement per surgery --Bowel regimen per orders --Monitor abdominal exam and bowel function closely --Will  consult general surgery  Normal anion gap metabolic acidosis Initially in the setting of renal failure and diarrhea.  Patient is off bicarb drip.  Placed on oral bicarb yesterday, today with improvement. 12/15: bicarb 18, up from 14.  pH is compensated. 12/16: bicarb improved to 20 --Hold further oral bicarb supplement today   UTI (urinary tract infection) Urine culture on admission with pan-sensitive E coli.  UTI could explain patient's symptoms, but seem out of proportion, concern for acute GI illness / infection. -- Completed 3 days IV ampicillin   Abdominal pain Abdominal distention due to Ileus Initial CT abd/pelvis was unrevealing as to cause.  Concern for ascites but no hx of liver disease, only trace ascites on U/S. Found to have ileus. Stool studies canceled as patient no longer having diarrhea. Now with ileus - mgmt as outlined. On antibiotics for UTI. Pain control as needed  Hypomagnesemia Mg 1.4 on 12/14 was replaced. Monitor Mg, replace PRN. 12/17: Mg 1.6, IV replacement ordered.  Severe sepsis ([place]) Pt met SIRS criteria on admission with tachycardia, leukocytosis, organ involvement is renal and respiratory.   Found to have UTI --Started antibiotics for UTI (12/14)   AKI (acute kidney injury) ([place]) History of CKD stage 3a AKI now resolved. Suspect prerenal azotemia given diarrhea and hypotension.  Nephrology consulted in the ED. Off bicarb drip.  Off IV fluids. Avoid nephrotoxins, hypotension, IV contrast. Monitor BMP daily.   Diarrhea Presented with marked leukocytosis and acute kidney injury. High suspicion for C diff or infectious etiology. Reha resolved chest after admission -- Canceled C diff and GI pathogen panel --Avoid anti-diarrheals given ileus  Hyperkalemia-resolved as of 02/01/2022 Initial K was 7.5, treated aggressively in the ED. Due to  renal failure. K normalized, 4.8 this AM. Monitor BMP.  Sore throat With dry mucosa.  Pharynx  difficult to visulaize.  --Started on Nystatin empirically on admisison - Cepacol (lozenges) as needed for sore throat   Generalized weakness Due to acute illness, hypotension. PT evaluation if needed  Elevated troponin Due to demand ischemia in setting of hypotension, AKI. No chest pain or acute changes on EKG.  GERD (gastroesophageal reflux disease) Appears not on PPI or H2 blocker.  COPD (chronic obstructive pulmonary disease) (HCC) Stable, not exacerbated.  Bronchodilators  Depression with anxiety Continue home fluoxetine, alprazolam PRN  Hypertension Presented hypotensive, so home antihypertensives were held. BP's have now been elevated, in setting of persistent abdominal pain / ileus. --hold home lisinopril, Lasix while using Toradol --start Coreg 6.25 mg BID --PRN hydralazine   Type II diabetes mellitus with renal manifestations (HCC) Non-insulin-dependent. A1c pending. Hold metformin due to AKI Sliding scale Novolog  Protein-calorie malnutrition, severe Dietitian consulted  Hypokalemia Initially with hyperkalemia in setting of AKI. K again today 3.4, despite replacement yesterday.   Replacing again today with 40 mEq oral K-Cl Monitor BMP        Subjective: Patient awake resting in bed when seen on rounds today.  She is now having BM's, but remains distended and with episodes of significant abdominal pain.  Requesting IV medication for breakthrough pain.  No N/V.  Physical Exam: Vitals:   02/01/22 0806 02/01/22 1446 02/02/22 0010 02/02/22 0753  BP: (!) 165/100 (!) 179/93 (!) 155/80 (!) 178/87  Pulse: 84 70 68 77  Resp: _0 Temp: 97.8 F (36.6 C) (!) 97.4 F (36.3 C) (!) 97.5 F (36.4 C) 97.7 F (36.5 C)  TempSrc:      SpO2: 100% 100% 100% 98%  Weight:      Height:       General exam: awake, alert, no acute distress HEENT: moist mucus membranes, hearing grossly normal  Respiratory system: On room air, normal respiratory effort. Lungs  clear no wheezes or rhonchi Cardiovascular system: RRR, no pedal edema.   Gastrointestinal system: persistent abdominal distention with mild tenderness, unable to appreciate bowel sounds Central nervous system: A&O x3. no gross focal neurologic deficits, normal speech Extremities: moves all, no edema, normal tone Psychiatry: normal mood, congruent affect, judgement and insight appear normal  Data Reviewed:  Labs notable for --   Na 133, bicarb 20 x 2, glucose 146, BUN 26, Cr normalized 0.97, Mg 1.6. WBC up slightly 17.4 >> 18.4, Hbg stable 10.8  Pending -- repeat CT abdomen/pelvis 12/17  Abdominal xrays -- 12/15 -- adynamic ileus -- 12/16 -- persistent to slightly worsened dilation of small bowel loops  Abdominal ultrasound --negative for acute findings, trace ascites, prior cholecystectomy  Urine culture grew pan-sensitive E. Coli  Cancelled --- C diff and GI pathogen panel - no diarrhea since day of admission    Family Communication: None  Disposition: Status is: Inpatient Remains inpatient appropriate because: Persistent abdmoinal pain and diarrhea, ongoing evaluation, requiring IV medications.   Planned Discharge Destination: Home    Time spent: 40 minutes  Author: Ezekiel Slocumb, DO 02/02/2022 3:03 PM  For on call review [email or web address].

## 2022-02-02 NOTE — Progress Notes (Signed)
02/02/2022  Subjective: No acute events overnight.  Patient reports that she had 2 large bowel movements.  Reports that her pain is about stable to maybe a little bit improved.  She is not sure if her distention is better or not.  Her white blood cell count is still elevated a little more compared to yesterday at 18.4.  Vital signs: Temp:  [97.4 F (36.3 C)-97.7 F (36.5 C)] 97.7 F (36.5 C) (12/17 0753) Pulse Rate:  [68-77] 77 (12/17 0753) Resp:  [17-19] 17 (12/17 0753) BP: (155-179)/(80-93) 178/87 (12/17 0753) SpO2:  [98 %-100 %] 98 % (12/17 0753)   Intake/Output: 12/16 0701 - 12/17 0700 In: 100 [IV Piggyback:100] Out: 1000 [Urine:1000] Last BM Date : 02/01/22  Physical Exam: Constitutional: No acute distress Abdomen: Soft, distended, with tenderness to palpation in the umbilical and epigastric areas.  No diffuse peritonitis.  Labs:  Recent Labs    02/01/22 0613 02/02/22 0440  WBC 17.4* 18.4*  HGB 11.7* 10.8*  HCT 38.2 35.1*  PLT 426* 394   Recent Labs    02/01/22 0613 02/02/22 0440  NA 135 133*  K 3.4* 3.5  CL 109 107  CO2 20* 20*  GLUCOSE 112* 146*  BUN 30* 26*  CREATININE 1.02* 0.97  CALCIUM 8.6* 8.1*   No results for input(s): "LABPROT", "INR" in the last 72 hours.  Imaging: No results found.  Assessment/Plan: This is a 61 y.o. female with potential ileus versus SBO.  - The patient had 2 large bowel movements yesterday but still is distended on exam and still having abdominal pain with high white blood cell count.  Initially thought to follow white blood cell count source was UTI but she is currently on the appropriate antibiotics without improvement.  Given the persistent pain and white blood cell count, will repeat CT scan of the abdomen pelvis with oral contrast to further evaluate for any potential intra-abdominal source. - For now continue clear liquid diet. - Further recs after CT scan is done.   I spent 35 minutes dedicated to the care of this  patient on the date of this encounter to include pre-visit review of records, face-to-face time with the patient discussing diagnosis and management, and any post-visit coordination of care.  Howie Ill, MD [facility]

## 2022-02-02 NOTE — Plan of Care (Signed)
  Problem: Nutritional: Goal: Maintenance of adequate nutrition will improve Outcome: Progressing Goal: Progress toward achieving an optimal weight will improve Outcome: Progressing   Problem: Tissue Perfusion: Goal: Adequacy of tissue perfusion will improve Outcome: Progressing   Problem: Elimination: Goal: Will not experience complications related to bowel motility Outcome: Progressing Goal: Will not experience complications related to urinary retention Outcome: Progressing   Problem: Pain Managment: Goal: General experience of comfort will improve Outcome: Progressing

## 2022-02-02 NOTE — Progress Notes (Signed)
Pharmacy Antibiotic Note  Whitney Bernard is a 61 y.o. female w/ PMH of pancreatitis, RA, CKD, CAD with prior history of MI, depression, HLD, HTN, COPD, DM admitted on 01/28/2022 with abdominal pain and diarrhea from UTI/infectious enteritis   Pharmacy has been consulted for ciprofloxacin dosing.  Plan: start ciprofloxacin 400 mg IV every 12 hours ---follow renal function for needed dose adjustments  Height: 5\' 4"  (162.6 cm) Weight: 74.4 kg (164 lb) IBW/kg (Calculated) : 54.7  Temp (24hrs), Avg:97.6 F (36.4 C), Min:97.5 F (36.4 C), Max:97.7 F (36.5 C)  Recent Labs  Lab 01/28/22 1527 01/28/22 1751 01/29/22 0604 01/30/22 0626 01/31/22 0646 02/01/22 0613 02/02/22 0440  WBC  --   --  21.1* 19.2* 21.2* 17.4* 18.4*  CREATININE  --    < > 2.38* 1.32* 1.14* 1.02* 0.97  LATICACIDVEN 1.1  --   --   --   --   --   --    < > = values in this interval not displayed.    Estimated Creatinine Clearance: 58.7 mL/min (by C-G formula based on SCr of 0.97 mg/dL).    Allergies  Allergen Reactions   Almond Oil Hives   Atorvastatin Other (See Comments)    myalgias   Rosuvastatin Other (See Comments)    Muscle pain    Antimicrobials this admission: 12/14 ampicillin >> 12/17 12/17 ciprofloxacin >>  12/17 metronidazole >>  Microbiology results: 12/12 BCx: NG 12/12 UCx: E coli (pan-S)   Thank you for allowing pharmacy to be a part of this patient's care.  1/18 02/02/2022 3:05 PM

## 2022-02-03 ENCOUNTER — Encounter: Payer: Self-pay | Admitting: Internal Medicine

## 2022-02-03 ENCOUNTER — Other Ambulatory Visit: Payer: Self-pay

## 2022-02-03 DIAGNOSIS — E871 Hypo-osmolality and hyponatremia: Secondary | ICD-10-CM | POA: Diagnosis present

## 2022-02-03 DIAGNOSIS — K560 Paralytic ileus: Secondary | ICD-10-CM | POA: Diagnosis not present

## 2022-02-03 LAB — CBC
HCT: 33.9 % — ABNORMAL LOW (ref 36.0–46.0)
Hemoglobin: 10.6 g/dL — ABNORMAL LOW (ref 12.0–15.0)
MCH: 26.4 pg (ref 26.0–34.0)
MCHC: 31.3 g/dL (ref 30.0–36.0)
MCV: 84.3 fL (ref 80.0–100.0)
Platelets: 386 10*3/uL (ref 150–400)
RBC: 4.02 MIL/uL (ref 3.87–5.11)
RDW: 15.9 % — ABNORMAL HIGH (ref 11.5–15.5)
WBC: 18.5 10*3/uL — ABNORMAL HIGH (ref 4.0–10.5)
nRBC: 0 % (ref 0.0–0.2)

## 2022-02-03 LAB — GLUCOSE, CAPILLARY
Glucose-Capillary: 151 mg/dL — ABNORMAL HIGH (ref 70–99)
Glucose-Capillary: 115 mg/dL — ABNORMAL HIGH (ref 70–99)
Glucose-Capillary: 144 mg/dL — ABNORMAL HIGH (ref 70–99)
Glucose-Capillary: 144 mg/dL — ABNORMAL HIGH (ref 70–99)

## 2022-02-03 LAB — BASIC METABOLIC PANEL
Anion gap: 6 (ref 5–15)
BUN: 17 mg/dL (ref 8–23)
CO2: 21 mmol/L — ABNORMAL LOW (ref 22–32)
Calcium: 8.3 mg/dL — ABNORMAL LOW (ref 8.9–10.3)
Chloride: 104 mmol/L (ref 98–111)
Creatinine, Ser: 0.86 mg/dL (ref 0.44–1.00)
GFR, Estimated: >60 mL/min (ref >60)
Glucose, Bld: 184 mg/dL — ABNORMAL HIGH (ref 70–99)
Potassium: 3.6 mmol/L (ref 3.5–5.1)
Sodium: 131 mmol/L — ABNORMAL LOW (ref 135–145)

## 2022-02-03 LAB — MAGNESIUM: Magnesium: 1.7 mg/dL (ref 1.7–2.4)

## 2022-02-03 MED ORDER — MORPHINE SULFATE (PF) 2 MG/ML IV SOLN: 2.0000 mg | INTRAVENOUS | Status: DC | PRN

## 2022-02-03 MED ORDER — MAGNESIUM SULFATE 2 GM/50ML IV SOLN
2.0000 g | Freq: Once | INTRAVENOUS | Status: AC
  Administered 2022-02-03: 2 g via INTRAVENOUS
  Filled 2022-02-03: qty 50

## 2022-02-03 MED ORDER — SODIUM CHLORIDE 0.9 % IV SOLN: INTRAVENOUS | Status: DC

## 2022-02-03 MED ORDER — OXYCODONE HCL 5 MG PO TABS
5.0000 mg | ORAL_TABLET | ORAL | Status: DC | PRN
  Administered 2022-02-03 – 2022-02-05 (×10): 10 mg via ORAL
  Filled 2022-02-03 (×10): qty 2

## 2022-02-03 NOTE — Progress Notes (Addendum)
Progress Note   Patient: Whitney Bernard GXQ:[id number] DOB: [birth year] DOA: 01/28/2022     6 DOS: the patient was seen and examined on 02/03/2022   Brief hospital course: Whitney Bernard is a 61 year old female with history of pancreatitis, CKD, CAD with prior history of MI, depression, hyperlipidemia, hypertension, rheumatoid arthritis, COPD, non-insulin-dependent diabetes mellitus type 2, who presented to the ED on 01/28/2022 for evaluation of abdominal pain and diarrhea.  Patient was tachypneic, tachycardic, hypotensive but afebrile in the ED. BP responded well to IV fluids.   Labs notable for Na 129, K 7.5, Cr 3.74. High anion gap metabolic acidosis.  WBC was 27.8k.    Hyperkalemia was aggressively treated and improved. Started on IV fluids and sodium level improved. EDP discussed case with nephrologist who recommends 1 more dose of Lokelma, starting sodium bicarb and dextrose infusion. Treated in the ED with metronidazole IV, cefepime, morphine 4 mg IV, fentanyl 50 mcg IV.  Admitted to medicine service for further evaluation and management. Stool studies were ordered to determine if infectious etiology.  Assessment and Plan:  12/17 afternoon addendum -- CT results reviewed, discussed with surgery team.  Pt has diffuse wall thickening at the terminal ileum concerning for an infectious enteritis.  Starting Cipro/Flagyl.  * Adynamic ileus ([place]) Abdominal x-ray obtained today 12/15 due to progressive abdominal distention.  It showed diffuse small bowel distention consistent with adynamic ileus.  Suspect caused by patient taking excessive Imodium. 12/16: upright KUB shows persistent to slightly worsened small bowel dilated loops, ileus vs SBO 12/17: surgery repeating CT scan today given return of bowel function but persistent pain and distention. Started antibiotics for CT evidence of enteritis of terminal ileum. -- Clear liquid diet for now, advancement per surgery --Bowel regimen per  orders --Monitor abdominal exam and bowel function closely --General surgery following  Normal anion gap metabolic acidosis Initially in the setting of renal failure and diarrhea.  Patient is off bicarb drip.  Placed on oral bicarb yesterday, today with improvement. 12/15: bicarb 18, up from 14.  pH is compensated. 12/16: bicarb improved to 20 12/18: bicarb 21, improving --Hold further oral bicarb supplement   UTI (urinary tract infection) Urine culture on admission with pan-sensitive E coli.  UTI could explain patient's symptoms, but seem out of proportion, concern for acute GI illness / infection. -- Completed 3 days IV ampicillin   Abdominal pain Abdominal distention due to Ileus Initial CT abd/pelvis was unrevealing as to cause.  Concern for ascites but no hx of liver disease, only trace ascites on U/S. Stool studies canceled as patient no longer having diarrhea. Now with ileus - mgmt as outlined. Pain control as needed  12/17 - started Cipro/Flagyl after repeat CT findings of enteritis of terminal ileum.  Hypomagnesemia Mg 1.4 on 12/14 was replaced. Monitor Mg, replace PRN. 12/17: Mg 1.6, IV replacement ordered.  AKI (acute kidney injury) ([place]) History of CKD stage 3a AKI now resolved. Suspect prerenal azotemia given diarrhea and hypotension.  Nephrology consulted in the ED. Off bicarb drip.  Off IV fluids. Avoid nephrotoxins, hypotension, IV contrast. Monitor BMP daily.   Diarrhea Presented with marked leukocytosis and acute kidney injury. High suspicion for C diff or infectious etiology. Reha resolved chest after admission -- Canceled C diff and GI pathogen panel --Avoid anti-diarrheals given ileus  Severe sepsis (HCC)-resolved as of 02/03/2022 Pt met SIRS criteria on admission with tachycardia, leukocytosis, organ involvement is renal and respiratory.   Found to have UTI Sepsis  physiology resolved.  --Completed antibiotics for UTI  --Started on Cipro/Flagyl  12/17 --Blood cultures negative, final.  Hyperkalemia-resolved as of 02/01/2022 Initial K was 7.5, treated aggressively in the ED. Due to renal failure. K normalized, 4.8 this AM. Monitor BMP.  Sore throat With dry mucosa.  Pharynx difficult to visulaize.  --Started on Nystatin empirically on admisison - Cepacol (lozenges) as needed for sore throat   Generalized weakness Due to acute illness, hypotension. PT evaluation if needed  Elevated troponin Due to demand ischemia in setting of hypotension, AKI. No chest pain or acute changes on EKG.  GERD (gastroesophageal reflux disease) Appears not on PPI or H2 blocker.  COPD (chronic obstructive pulmonary disease) (HCC) Stable, not exacerbated.  Bronchodilators  Depression with anxiety Continue home fluoxetine, alprazolam PRN  Hypertension Presented hypotensive, so home antihypertensives were held. BP's have now been elevated, in setting of persistent abdominal pain / ileus. --holding home lisinopril, Lasix while using Toradol --start Coreg 6.25 mg BID, increased to 12.5 mg --PRN hydralazine  --Pain contributing, continue analgesics PRN  Type II diabetes mellitus with renal manifestations (HCC) Non-insulin-dependent. A1c pending. Hold metformin due to AKI Sliding scale Novolog  Hyponatremia Na 131 this AM, with limited PO intake due to ileus.  Likely hypovolemic. Start fluids with NS @ 75 cc/hr today. Repeat BMP in AM.  Protein-calorie malnutrition, severe Dietitian consulted  Hypokalemia Initially with hyperkalemia in setting of AKI. K today normal.  Monitor BMP & replace K PRN        Subjective: Patient awake resting in bed when seen on rounds today.  She continues to have BM's. Tolerating clear liquids.  Continues to have significant pain at times but overall feels somewhat better today.  Antibiotics started yesterday.   Physical Exam: Vitals:   02/02/22 0753 02/02/22 1530 02/02/22 2144 02/03/22 0814   BP: (!) 178/87 (!) 156/82 (!) 166/79 (!) 146/70  Pulse: 77 83 62 71  Resp: _0 Temp: 97.7 F (36.5 C) (!) 97.4 F (36.3 C) 98.7 F (37.1 C) 98 F (36.7 C)  TempSrc:      SpO2: 98% 100% 100% 97%  Weight:      Height:       General exam: awake, alert, no acute distress HEENT: moist mucus membranes, hearing grossly normal  Respiratory system: On room air, normal respiratory effort. Lungs clear no wheezes or rhonchi Cardiovascular system: RRR, no pedal edema.   Gastrointestinal system: abdomen is less distended, mildly tender, positive bowel sounds Central nervous system: A&O x3. no gross focal neurologic deficits, normal speech Extremities: moves all, no edema, normal tone Psychiatry: normal mood, congruent affect, judgement and insight appear normal  Data Reviewed:  Labs notable for --   Na 133 >> 131, glucose 184, Ca 8.3, WBC 18.5 from 18.4, Hbg stable 10.8  Abdominal xrays -- 12/15 -- adynamic ileus -- 12/16 -- persistent to slightly worsened dilation of small bowel loops -- 12/17 -- repeat CT abd/pelvis showed thickening of terminal ileum with proximal small bowel dilation, concerning for infection/inflammation - enteritis.  Abdominal ultrasound --negative for acute findings, trace ascites, prior cholecystectomy  Urine culture grew pan-sensitive E. Coli  Cancelled --- C diff and GI pathogen panel - no diarrhea since day of admission    Family Communication: None  Disposition: Status is: Inpatient Remains inpatient appropriate because: Persistent abdmoinal pain and diarrhea, ongoing evaluation, requiring IV medications.   Planned Discharge Destination: Home    Time spent: 40 minutes  Author: Floyce Stakes  Arbutus Ped, DO 02/03/2022 2:18 PM  For on call review [email or web address].

## 2022-02-03 NOTE — Plan of Care (Signed)
  Problem: Fluid Volume: Goal: Ability to maintain a balanced intake and output will improve Outcome: Progressing   Problem: Metabolic: Goal: Ability to maintain appropriate glucose levels will improve Outcome: Progressing   

## 2022-02-03 NOTE — Assessment & Plan Note (Signed)
Resolved with IV fluids, Na 131 >> 135 today With limited PO intake due to ileus, likely hypovolemic. Stop fluids and monitor for stability. Repeat BMP in AM.

## 2022-02-03 NOTE — Progress Notes (Signed)
[facility] SURGICAL PROGRESS NOTE (cpt [phone])  Hospital Day(s): 6.   Interval History: Patient seen and examined, no acute events or new complaints overnight. Patient reports improvement in her abdominal discomfort. She denies fever, chills, nausea, emesis. Leukocytosis stable; 18.5K. Hgb stable to 10.6. Renal function normal; sCr - 0.86; UO - 2251 ccs. Mild hyponatremia to 1313 otherwise no significant electrolyte derangements. She is on CLD; tolerating well. Now on Cipro + Flagyl for possible enteritis. She does have recorded BM and is passing flatus.   Review of Systems:  Constitutional: denies fever, chills  HEENT: denies cough or congestion  Respiratory: denies any shortness of breath  Cardiovascular: denies chest pain or palpitations  Gastrointestinal: + abdominal pain (improving); denied N/V Genitourinary: denies burning with urination or urinary frequency Musculoskeletal: denies pain, decreased motor or sensation  Vital signs in last 24 hours: [min-max] current  Temp:  [97.4 F (36.3 C)-98.7 F (37.1 C)] 98.7 F (37.1 C) (12/17 2144) Pulse Rate:  [62-83] 62 (12/17 2144) Resp:  [17-20] 20 (12/17 2144) BP: (156-178)/(79-87) 166/79 (12/17 2144) SpO2:  [98 %-100 %] 100 % (12/17 2144)     Height: 5\' 4"  (162.6 cm) Weight: 74.4 kg BMI (Calculated): 28.14   Intake/Output last 2 shifts:  12/17 0701 - 12/18 0700 In: 1200 [P.O.:1200] Out: 2252 [Urine:2251; Stool:1]   Physical Exam:  Constitutional: alert, cooperative and no distress  HENT: normocephalic without obvious abnormality  Eyes: PERRL, EOM's grossly intact and symmetric  Respiratory: breathing non-labored at rest  Cardiovascular: regular rate and sinus rhythm  Gastrointestinal: Soft, abdominal pain significantly improved; mild distension, no rebound/guarding Musculoskeletal: no edema or wounds, motor and sensation grossly intact, NT    Labs:     Latest Ref Rng & Units 02/03/2022    5:17 AM  02/02/2022    4:40 AM 02/01/2022    6:13 AM  CBC  WBC 4.0 - 10.5 K/uL 18.5  18.4  17.4   Hemoglobin 12.0 - 15.0 g/dL 02/03/2022  60.4  54.0   Hematocrit 36.0 - 46.0 % 33.9  35.1  38.2   Platelets 150 - 400 K/uL 386  394  426       Latest Ref Rng & Units 02/03/2022    5:17 AM 02/02/2022    4:40 AM 02/01/2022    6:13 AM  CMP  Glucose 70 - 99 mg/dL 02/03/2022  191  478   BUN 8 - 23 mg/dL 17  26  30    Creatinine 0.44 - 1.00 mg/dL 295   6.21   Sodium 135 - 145 mmol/L 131  133  135   Potassium 3.5 - 5.1 mmol/L 3.6  3.5  3.4   Chloride 98 - 111 mmol/L 104  107  109   CO2 22 - 32 mmol/L 21  20  20    Calcium 8.9 - 10.3 mg/dL 8.3  8.1  8.6     Imaging studies: No new pertinent imaging studies   Assessment/Plan: (ICD-10's: K55.609) 61 y.o. female with possible enteritis vs ileus vs SBO   - Recommend continue CLD for now; Likely begin advancing diet as tolerated tomorrow  - Continue Abx (Cipro / Flagyl)  - No role for NGT at this time - No need for surgical intervention - Monitor abdominal examination; on-going bowel function - Pain control prn; antiemetics prn - Monitor leukocytosis; stable  - Mobilize  - Further management per primary service; we will follow    - Discharge Planning: Monitor leukocytosis, diet advancement tomorrow. Potentially  home tomorrow (12/19) afternoon vs Wednesday    All of the above findings and recommendations were discussed with the patient, and the medical team, and all of patient's questions were answered to her expressed satisfaction.  -- Lynden Oxford, PA-C [facility] 02/03/2022, 7:11 AM M-F: 7am - 4pm

## 2022-02-04 ENCOUNTER — Inpatient Hospital Stay: Payer: 59

## 2022-02-04 ENCOUNTER — Other Ambulatory Visit: Payer: Self-pay

## 2022-02-04 DIAGNOSIS — K560 Paralytic ileus: Secondary | ICD-10-CM | POA: Diagnosis not present

## 2022-02-04 DIAGNOSIS — R1084 Generalized abdominal pain: Secondary | ICD-10-CM | POA: Diagnosis not present

## 2022-02-04 LAB — BASIC METABOLIC PANEL
Anion gap: 4 — ABNORMAL LOW (ref 5–15)
BUN: 15 mg/dL (ref 8–23)
CO2: 24 mmol/L (ref 22–32)
Calcium: 8.4 mg/dL — ABNORMAL LOW (ref 8.9–10.3)
Chloride: 107 mmol/L (ref 98–111)
Creatinine, Ser: 0.89 mg/dL (ref 0.44–1.00)
GFR, Estimated: >60 mL/min (ref >60)
Glucose, Bld: 125 mg/dL — ABNORMAL HIGH (ref 70–99)
Potassium: 4 mmol/L (ref 3.5–5.1)
Sodium: 135 mmol/L (ref 135–145)

## 2022-02-04 LAB — CBC
HCT: 33.9 % — ABNORMAL LOW (ref 36.0–46.0)
Hemoglobin: 10.4 g/dL — ABNORMAL LOW (ref 12.0–15.0)
MCH: 26.3 pg (ref 26.0–34.0)
MCHC: 30.7 g/dL (ref 30.0–36.0)
MCV: 85.6 fL (ref 80.0–100.0)
Platelets: 370 10*3/uL (ref 150–400)
RBC: 3.96 MIL/uL (ref 3.87–5.11)
RDW: 16.2 % — ABNORMAL HIGH (ref 11.5–15.5)
WBC: 15.2 10*3/uL — ABNORMAL HIGH (ref 4.0–10.5)
nRBC: 0 % (ref 0.0–0.2)

## 2022-02-04 LAB — MAGNESIUM: Magnesium: 1.9 mg/dL (ref 1.7–2.4)

## 2022-02-04 LAB — GLUCOSE, CAPILLARY
Glucose-Capillary: 105 mg/dL — ABNORMAL HIGH (ref 70–99)
Glucose-Capillary: 164 mg/dL — ABNORMAL HIGH (ref 70–99)
Glucose-Capillary: 138 mg/dL — ABNORMAL HIGH (ref 70–99)
Glucose-Capillary: 112 mg/dL — ABNORMAL HIGH (ref 70–99)

## 2022-02-04 MED ORDER — CIPROFLOXACIN HCL 500 MG PO TABS
500.0000 mg | ORAL_TABLET | Freq: Two times a day (BID) | ORAL | 0 refills | Status: AC
  Filled 2022-02-04: qty 14, 7d supply, fill #0

## 2022-02-04 MED ORDER — METRONIDAZOLE 500 MG PO TABS
500.0000 mg | ORAL_TABLET | Freq: Two times a day (BID) | ORAL | 0 refills | Status: AC
  Filled 2022-02-04: qty 14, 7d supply, fill #0

## 2022-02-04 MED ORDER — OXYCODONE HCL 5 MG PO TABS
5.0000 mg | ORAL_TABLET | ORAL | 0 refills | Status: DC | PRN
  Filled 2022-02-04: qty 30, 3d supply, fill #0

## 2022-02-04 MED ORDER — SENNOSIDES-DOCUSATE SODIUM 8.6-50 MG PO TABS
1.0000 | ORAL_TABLET | Freq: Two times a day (BID) | ORAL | 0 refills | Status: DC
  Filled 2022-02-04: qty 60, 30d supply, fill #0

## 2022-02-04 MED ORDER — POLYETHYLENE GLYCOL 3350 17 G PO PACK
17.0000 g | PACK | Freq: Every day | ORAL | 0 refills | Status: DC
  Filled 2022-02-04: qty 14, 14d supply, fill #0

## 2022-02-04 NOTE — Plan of Care (Signed)
  Problem: Fluid Volume: Goal: Ability to maintain a balanced intake and output will improve Outcome: Progressing   Problem: Nutritional: Goal: Maintenance of adequate nutrition will improve Outcome: Progressing   Problem: Skin Integrity: Goal: Risk for impaired skin integrity will decrease Outcome: Progressing   Problem: Activity: Goal: Risk for activity intolerance will decrease Outcome: Progressing   Problem: Nutrition: Goal: Adequate nutrition will be maintained Outcome: Progressing   Problem: Pain Managment: Goal: General experience of comfort will improve Outcome: Progressing   Problem: Safety: Goal: Ability to remain free from injury will improve Outcome: Progressing   Problem: Skin Integrity: Goal: Risk for impaired skin integrity will decrease Outcome: Progressing

## 2022-02-04 NOTE — Progress Notes (Signed)
[facility] SURGICAL PROGRESS NOTE (cpt [phone])  Hospital Day(s): 7.   Interval History: Patient seen and examined, no acute events or new complaints overnight. Patient reports she feels markedly better. She denied any abdominal pain, nausea, emesis. Leukocytosis improving; 15.2K. Renal function normal; sCr - 0.89; UO - 700 ccs ccs. No significant electrolyte derangements. She is on CLD; tolerating well. Now on Cipro + Flagyl for possible enteritis. She does have recorded BM and is passing flatus.   Review of Systems:  Constitutional: denies fever, chills  HEENT: denies cough or congestion  Respiratory: denies any shortness of breath  Cardiovascular: denies chest pain or palpitations  Gastrointestinal: + abdominal pain (improving); denied N/V Genitourinary: denies burning with urination or urinary frequency Musculoskeletal: denies pain, decreased motor or sensation  Vital signs in last 24 hours: [min-max] current  Temp:  [97.9 F (36.6 C)-98 F (36.7 C)] 97.9 F (36.6 C) (12/18 2256) Pulse Rate:  [70-77] 77 (12/18 2256) Resp:  [16] 16 (12/18 2256) BP: (146-148)/(70-76) 146/76 (12/18 2256) SpO2:  [97 %-100 %] 100 % (12/18 2256)     Height: 5\' 4"  (162.6 cm) Weight: 74.4 kg BMI (Calculated): 28.14   Intake/Output last 2 shifts:  12/18 0701 - 12/19 0700 In: 0  Out: 700 [Urine:700]   Physical Exam:  Constitutional: alert, cooperative and no distress  HENT: normocephalic without obvious abnormality  Eyes: PERRL, EOM's grossly intact and symmetric  Respiratory: breathing non-labored at rest  Cardiovascular: regular rate and sinus rhythm  Gastrointestinal: Soft, non-tender, previous distension resolved, no rebound/guarding Musculoskeletal: no edema or wounds, motor and sensation grossly intact, NT    Labs:     Latest Ref Rng & Units 02/04/2022    3:25 AM 02/03/2022    5:17 AM 02/02/2022    4:40 AM  CBC  WBC 4.0 - 10.5 K/uL 15.2  18.5  18.4   Hemoglobin 12.0 -  15.0 g/dL 02/04/2022  90.2  40.9   Hematocrit 36.0 - 46.0 % 33.9  33.9  35.1   Platelets 150 - 400 K/uL 370  386  394       Latest Ref Rng & Units 02/04/2022    3:25 AM 02/03/2022    5:17 AM 02/02/2022    4:40 AM  CMP  Glucose 70 - 99 mg/dL 02/04/2022  329  924   BUN 8 - 23 mg/dL 15  17  26    Creatinine 0.44 - 1.00 mg/dL 268   3.41   Sodium 135 - 145 mmol/L 135  131  133   Potassium 3.5 - 5.1 mmol/L 4.0  3.6  3.5   Chloride 98 - 111 mmol/L 107  104  107   CO2 22 - 32 mmol/L 24  21  20    Calcium 8.9 - 10.3 mg/dL 8.4  8.3  8.1     Imaging studies: No new pertinent imaging studies   Assessment/Plan: (ICD-10's: K60.609) 62 y.o. female with possible enteritis vs ileus vs SBO   - Advance diet today  - Continue Abx (Cipro / Flagyl); complete PO course at home - Monitor abdominal examination; on-going bowel function - Pain control prn; antiemetics prn - Monitor leukocytosis; improving - Mobilize  - Further management per primary service   - Discharge Planning: Advancing diet today. If tolerates advancement, okay to discharge home this afternoon vs tomorrow morning from surgical perspective. We will remain available as needed.   All of the above findings and recommendations were discussed with the patient, and the medical  team, and all of patient's questions were answered to her expressed satisfaction.  -- Lynden Oxford, PA-C [facility] 02/04/2022, 7:35 AM M-F: 7am - 4pm

## 2022-02-04 NOTE — Progress Notes (Signed)
Progress Note   Patient: Whitney Bernard WCH:[id number] DOB: [birth year] DOA: 01/28/2022     7 DOS: the patient was seen and examined on 02/04/2022   Brief hospital course: Ms. Talon Witting is a 61 year old female with history of pancreatitis, CKD, CAD with prior history of MI, depression, hyperlipidemia, hypertension, rheumatoid arthritis, COPD, non-insulin-dependent diabetes mellitus type 2, who presented to the ED on 01/28/2022 for evaluation of abdominal pain and diarrhea.  Patient was tachypneic, tachycardic, hypotensive but afebrile in the ED. BP responded well to IV fluids.   Labs notable for Na 129, K 7.5, Cr 3.74. High anion gap metabolic acidosis.  WBC was 27.8k.    Hyperkalemia was aggressively treated and improved. Started on IV fluids and sodium level improved. EDP discussed case with nephrologist who recommends 1 more dose of Lokelma, starting sodium bicarb and dextrose infusion. Treated in the ED with metronidazole IV, cefepime, morphine 4 mg IV, fentanyl 50 mcg IV.  Admitted to medicine service for further evaluation and management. Stool studies were ordered to determine if infectious etiology.  Assessment and Plan:  * Adynamic ileus ([place]) Abdominal x-ray obtained today 12/15 due to progressive abdominal distention.  It showed diffuse small bowel distention consistent with adynamic ileus.  Suspect caused by patient taking excessive Imodium. 12/16: upright KUB shows persistent to slightly worsened small bowel dilated loops, ileus vs SBO 12/17: surgery repeating CT scan today given return of bowel function but persistent pain and distention. Started antibiotics for CT evidence of enteritis of terminal ileum. --General surgery following -- Diet advanced to fulls, advancement per surgery --Bowel regimen per orders --Monitor abdominal exam and bowel function closely  Normal anion gap metabolic acidosis Resolved. Initially in the setting of renal failure and diarrhea.  Patient is  off bicarb drip.  Given PO oral bicarb x 2 days. Bicarb has normalized.  UTI (urinary tract infection) Urine culture on admission with pan-sensitive E coli.  UTI could explain patient's symptoms, but seem out of proportion, concern for acute GI illness / infection. -- Completed 3 days IV ampicillin   Abdominal pain Abdominal distention due to Ileus Initial CT abd/pelvis was unrevealing as to cause.  Concern for ascites but no hx of liver disease, only trace ascites on U/S. Stool studies canceled as patient no longer having diarrhea. Now with ileus - mgmt as outlined. Pain control as needed  12/17 - started Cipro/Flagyl after repeat CT findings of enteritis of terminal ileum. --Continue Cipro/Flagyl  Hypomagnesemia Mg 1.4 on 12/14 was replaced. Monitor Mg, replace PRN. 12/17: Mg 1.6, IV replacement ordered.  AKI (acute kidney injury) ([place]) History of CKD stage 3a AKI now resolved. Suspect prerenal azotemia given diarrhea and hypotension.  Nephrology consulted in the ED. Off bicarb drip.  Off IV fluids. Avoid nephrotoxins, hypotension, IV contrast. Monitor BMP daily.   Diarrhea Presented with marked leukocytosis and acute kidney injury. High suspicion for C diff or infectious etiology. Reha resolved chest after admission -- Canceled C diff and GI pathogen panel --Avoid anti-diarrheals given ileus  Severe sepsis (HCC)-resolved as of 02/03/2022 Pt met SIRS criteria on admission with tachycardia, leukocytosis, organ involvement is renal and respiratory.   Found to have UTI Sepsis physiology resolved.  --Completed antibiotics for UTI  --Started on Cipro/Flagyl 12/17 --Blood cultures negative, final.  Hyperkalemia-resolved as of 02/01/2022 Initial K was 7.5, treated aggressively in the ED. Due to renal failure. K normalized, 4.8 this AM. Monitor BMP.  Sore throat With dry mucosa.  Pharynx difficult to  visulaize.  --Started on Nystatin empirically on admisison - Cepacol  (lozenges) as needed for sore throat   Generalized weakness Due to acute illness, hypotension. PT evaluation if needed  Elevated troponin Due to demand ischemia in setting of hypotension, AKI. No chest pain or acute changes on EKG.  GERD (gastroesophageal reflux disease) Appears not on PPI or H2 blocker.  COPD (chronic obstructive pulmonary disease) (HCC) Stable, not exacerbated.  Bronchodilators  Depression with anxiety Continue home fluoxetine, alprazolam PRN  Hypertension Presented hypotensive, so home antihypertensives were held. BP's have now been elevated, in setting of persistent abdominal pain / ileus. --holding home lisinopril, Lasix while using Toradol --start Coreg 6.25 mg BID, increased to 12.5 mg --PRN hydralazine  --Pain contributing, continue analgesics PRN  Type II diabetes mellitus with renal manifestations (HCC) Non-insulin-dependent. A1c pending. Hold metformin due to AKI Sliding scale Novolog  Hyponatremia Resolved with IV fluids, Na 131 >> 135 today With limited PO intake due to ileus, likely hypovolemic. Stop fluids and monitor for stability. Repeat BMP in AM.  Protein-calorie malnutrition, severe Dietitian consulted  Hypokalemia Initially with hyperkalemia in setting of AKI. K today normal.  Monitor BMP & replace K PRN        Subjective: Patient awake resting in bed when seen on rounds today.  She reports feeling overall better.  She was advanced to full liquids per surgery team.  She developed abdominal "rumbling" shortly after starting her meal, so she didn't want to continue.  Still has abdominal pain, no N/V and distention is better.  Having BM's and flatus.   Physical Exam: Vitals:   02/03/22 0814 02/03/22 1611 02/03/22 2256 02/04/22 0812  BP: (!) 146/70 (!) 148/76 (!) 146/76 (!) 158/85  Pulse: 71 70 77 67  Resp:   16   Temp: 98 F (36.7 C)  97.9 F (36.6 C) 98.4 F (36.9 C)  TempSrc:      SpO2: 97%  100% 100%  Weight:       Height:       General exam: awake, alert, no acute distress HEENT: moist mucus membranes, hearing grossly normal  Respiratory system: On room air, normal respiratory effort. Lungs clear no wheezes or rhonchi Cardiovascular system: RRR, no pedal edema.   Gastrointestinal system: abdomen is less distended, mildly tender, positive bowel sounds Central nervous system: A&O x3. no gross focal neurologic deficits, normal speech Extremities: moves all, no edema, normal tone Psychiatry: normal mood, congruent affect, judgement and insight appear normal  Data Reviewed:  Labs notable for --   sodium normalized with fluids 131 >>135.  Glucose 125. Normal renal function.  WBC improving 18.5 >> 15.2k, Hbg stable 10.4  Abdominal xrays -- 12/15 -- adynamic ileus -- 12/16 -- persistent to slightly worsened dilation of small bowel loops -- 12/17 -- repeat CT abd/pelvis showed thickening of terminal ileum with proximal small bowel dilation, concerning for infection/inflammation - enteritis.  Abdominal ultrasound --negative for acute findings, trace ascites, prior cholecystectomy  Urine culture grew pan-sensitive E. Coli  C diff and GI pathogen panel cancelled - no diarrhea since day of admission    Family Communication: None  Disposition: Status is: Inpatient Remains inpatient appropriate because: Persistent abdmoinal pain and diarrhea, ongoing evaluation, requiring IV medications.   Planned Discharge Destination: Home    Time spent: 35 minutes  Author: Ezekiel Slocumb, DO 02/04/2022 2:52 PM  For on call review [email or web address].

## 2022-02-04 NOTE — Plan of Care (Signed)
  Problem: Fluid Volume: Goal: Ability to maintain a balanced intake and output will improve Outcome: Progressing   Problem: Nutritional: Goal: Maintenance of adequate nutrition will improve Outcome: Progressing   Problem: Skin Integrity: Goal: Risk for impaired skin integrity will decrease 02/04/2022 0348 by Laverta Baltimore, RN Outcome: Progressing 02/04/2022 0343 by Laverta Baltimore, RN Outcome: Progressing   Problem: Tissue Perfusion: Goal: Adequacy of tissue perfusion will improve Outcome: Progressing   Problem: Education: Goal: Knowledge of General Education information will improve Description: Including pain rating scale, medication(s)/side effects and non-pharmacologic comfort measures Outcome: Progressing   Problem: Activity: Goal: Risk for activity intolerance will decrease Outcome: Progressing   Problem: Nutrition: Goal: Adequate nutrition will be maintained 02/04/2022 0348 by Laverta Baltimore, RN Outcome: Progressing 02/04/2022 0343 by Laverta Baltimore, RN Outcome: Progressing   Problem: Coping: Goal: Level of anxiety will decrease Outcome: Progressing   Problem: Elimination: Goal: Will not experience complications related to bowel motility Outcome: Progressing Goal: Will not experience complications related to urinary retention Outcome: Progressing   Problem: Pain Managment: Goal: General experience of comfort will improve 02/04/2022 0348 by Laverta Baltimore, RN Outcome: Progressing 02/04/2022 0343 by Laverta Baltimore, RN Outcome: Progressing   Problem: Safety: Goal: Ability to remain free from injury will improve Outcome: Progressing   Problem: Skin Integrity: Goal: Risk for impaired skin integrity will decrease Outcome: Progressing

## 2022-02-04 NOTE — TOC Initial Note (Signed)
Transition of Care [facility]) - Initial/Assessment Note    Patient Details  Name: Whitney Bernard MRN: [id number] Date of Birth: 09-23-1960  [facility]) CM/SW Contact:    Quin Hoop, LCSW Phone Number: 02/04/2022, 8:22 AM  Clinical Narrative:                 Initial assessment completed by CSW.  Pt lives at home with husband, Herschel [place] and their two sons.  Pt PCP is Dr. Bernette Mayers. [facility] transports her to her appointments.  She currently uses the employee pharmacy and she has no concerns about being able to afford her medications.    Expected Discharge Plan:  (unknown at this time) Barriers to Discharge: Continued Medical Work up   Patient Goals and CMS Choice Patient states their goals for this hospitalization and ongoing recovery are:: home      Expected Discharge Plan and Services Expected Discharge Plan:  (unknown at this time)       Living arrangements for the past 2 months: Single Family Home                                      Prior Living Arrangements/Services Living arrangements for the past 2 months: Single Family Home                     Activities of Daily Living Home Assistive Devices/Equipment: None ADL Screening (condition at time of admission) Patient's cognitive ability adequate to safely complete daily activities?: Yes Is the patient deaf or have difficulty hearing?: No Does the patient have difficulty seeing, even when wearing glasses/contacts?: No Does the patient have difficulty concentrating, remembering, or making decisions?: No Patient able to express need for assistance with ADLs?: Yes Does the patient have difficulty dressing or bathing?: Yes Independently performs ADLs?: Yes (appropriate for developmental age) Does the patient have difficulty walking or climbing stairs?: Yes Weakness of Legs: Both Weakness of Arms/Hands: None  Permission Sought/Granted                  Emotional Assessment Appearance::  (unable to  assess)       Alcohol / Substance Use: Not Applicable Psych Involvement: No (comment)  Admission diagnosis:  Hyperkalemia [E87.5] AKI (acute kidney injury) ([place]) [N17.9] Abdominal pain, unspecified abdominal location [R10.9] Patient Active Problem List   Diagnosis Date Noted   Hyponatremia 02/03/2022   Adynamic ileus ([place]) 01/31/2022   Normal anion gap metabolic acidosis [id number]   Hypomagnesemia 01/30/2022   Abdominal pain 01/30/2022   UTI (urinary tract infection) 01/30/2022   Sore throat 01/28/2022   Uremia    Acute cystitis without hematuria    Acute urinary retention    AKI (acute kidney injury) ([place])    Generalized weakness 06/15/2021   Anticholinergic syndrome, accidental or unintentional, initial encounter 06/15/2021   AMS (altered mental status) 06/15/2021   Hypertensive urgency 03/28/2020   Microcytic anemia 11/02/2019   CAD (coronary artery disease) 06/06/2019   Depression 06/06/2019   Unstable angina (HCC)    COPD (chronic obstructive pulmonary disease) ([place])    Hypertension    CKD (chronic kidney disease), stage IIIa    Chest pain, unspecified    NSTEMI (non-ST elevated myocardial infarction) ([place])    Elevated troponin    GERD (gastroesophageal reflux disease)    Type II diabetes mellitus with renal manifestations ([place])    Chest pain, non-cardiac  08/23/2017   Protein-calorie malnutrition, severe 12/26/2014   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

## 2022-02-04 NOTE — Plan of Care (Signed)
  Problem: Fluid Volume: Goal: Ability to maintain a balanced intake and output will improve Outcome: Progressing   Problem: Nutritional: Goal: Maintenance of adequate nutrition will improve Outcome: Progressing Goal: Progress toward achieving an optimal weight will improve Outcome: Progressing   Problem: Tissue Perfusion: Goal: Adequacy of tissue perfusion will improve Outcome: Progressing   Problem: Nutrition: Goal: Adequate nutrition will be maintained Outcome: Progressing   Problem: Pain Managment: Goal: General experience of comfort will improve Outcome: Progressing

## 2022-02-05 ENCOUNTER — Other Ambulatory Visit: Payer: Self-pay

## 2022-02-05 DIAGNOSIS — N3000 Acute cystitis without hematuria: Secondary | ICD-10-CM | POA: Diagnosis not present

## 2022-02-05 DIAGNOSIS — K560 Paralytic ileus: Secondary | ICD-10-CM | POA: Diagnosis not present

## 2022-02-05 DIAGNOSIS — N179 Acute kidney failure, unspecified: Secondary | ICD-10-CM | POA: Diagnosis not present

## 2022-02-05 LAB — CBC
HCT: 31.6 % — ABNORMAL LOW (ref 36.0–46.0)
Hemoglobin: 9.6 g/dL — ABNORMAL LOW (ref 12.0–15.0)
MCH: 26.4 pg (ref 26.0–34.0)
MCHC: 30.4 g/dL (ref 30.0–36.0)
MCV: 86.8 fL (ref 80.0–100.0)
Platelets: 406 10*3/uL — ABNORMAL HIGH (ref 150–400)
RBC: 3.64 MIL/uL — ABNORMAL LOW (ref 3.87–5.11)
RDW: 16.7 % — ABNORMAL HIGH (ref 11.5–15.5)
WBC: 16.1 10*3/uL — ABNORMAL HIGH (ref 4.0–10.5)
nRBC: 0 % (ref 0.0–0.2)

## 2022-02-05 LAB — BASIC METABOLIC PANEL
Anion gap: 7 (ref 5–15)
BUN: 18 mg/dL (ref 8–23)
CO2: 21 mmol/L — ABNORMAL LOW (ref 22–32)
Calcium: 8.2 mg/dL — ABNORMAL LOW (ref 8.9–10.3)
Chloride: 107 mmol/L (ref 98–111)
Creatinine, Ser: 0.98 mg/dL (ref 0.44–1.00)
GFR, Estimated: >60 mL/min (ref >60)
Glucose, Bld: 121 mg/dL — ABNORMAL HIGH (ref 70–99)
Potassium: 4.2 mmol/L (ref 3.5–5.1)
Sodium: 135 mmol/L (ref 135–145)

## 2022-02-05 LAB — GLUCOSE, CAPILLARY
Glucose-Capillary: 121 mg/dL — ABNORMAL HIGH (ref 70–99)
Glucose-Capillary: 147 mg/dL — ABNORMAL HIGH (ref 70–99)

## 2022-02-05 LAB — PROCALCITONIN: Procalcitonin: <0.1 ng/mL

## 2022-02-05 MED ORDER — PROSOURCE PLUS PO LIQD: 30.0000 mL | Freq: Three times a day (TID) | ORAL | Status: AC

## 2022-02-05 MED ORDER — CARVEDILOL 6.25 MG PO TABS
6.2500 mg | ORAL_TABLET | Freq: Two times a day (BID) | ORAL | 0 refills | Status: DC
  Filled 2022-02-05: qty 60, 30d supply, fill #0

## 2022-02-05 MED ORDER — ONDANSETRON HCL 4 MG PO TABS
4.0000 mg | ORAL_TABLET | Freq: Every day | ORAL | 1 refills | Status: AC | PRN
  Filled 2022-02-05: qty 30, 30d supply, fill #0

## 2022-02-05 NOTE — Discharge Summary (Signed)
Physician Discharge Summary   Patient: Whitney Bernard MRN: [id number] DOB: [birth year]  Admit date:     01/28/2022  Discharge date: 02/05/22  Discharge Physician: Annita Brod   PCP: Macarthur Critchley, MD   Recommendations at discharge:   New medication: Cipro 500 p.o. twice daily x 7 days New medication: Flagyl 500 mg p.o. twice daily x 7 days New medication: Oxy IR 5 mg 1 to 2 tablets every 4 hours as needed for severe pain New medication: MiraLAX 17 g p.o. daily New medication: Senokot 1 tab p.o. twice daily New medication: Zofran 4 mg daily as needed for nausea and vomiting. Patient will follow-up with general surgery as needed.  Discharge Diagnoses: Active Problems:   Abdominal pain   Normal anion gap metabolic acidosis   Elevated troponin   Generalized weakness   Depression with anxiety   COPD (chronic obstructive pulmonary disease) (HCC)   GERD (gastroesophageal reflux disease)   Hypertension   Type II diabetes mellitus with renal manifestations (HCC)   Protein-calorie malnutrition, severe  Principal Problem (Resolved):   Adynamic ileus ([place]) Resolved Problems:   Diarrhea   Hyperkalemia   AKI (acute kidney injury) ([place])   Severe sepsis (HCC)   Hypomagnesemia   UTI (urinary tract infection)   Sore throat   Hypokalemia   Hyponatremia  Hospital Course: Ms. Whitney Bernard is a 61 year old female with history of pancreatitis, CKD, CAD with prior history of MI, depression, hyperlipidemia, hypertension, rheumatoid arthritis, COPD, non-insulin-dependent diabetes mellitus type 2, who presented to the ED on 01/28/2022 for evaluation of abdominal pain and diarrhea.  Patient was tachypneic, tachycardic, hypotensive but afebrile in the ED. BP responded well to IV fluids.   Labs notable for Na 129, K 7.5, Cr 3.74. High anion gap metabolic acidosis.  WBC was 27.8k.    Hyperkalemia was aggressively treated and improved. Started on IV fluids and sodium level improved. EDP  discussed case with nephrologist who recommends 1 more dose of Lokelma, starting sodium bicarb and dextrose infusion. Treated in the ED with metronidazole IV, cefepime, morphine 4 mg IV, fentanyl 50 mcg IV.  Admitted to medicine service for further evaluation and management. Stool studies were ordered to determine if infectious etiology.  Assessment and Plan: * Adynamic ileus (HCC)-resolved as of 02/05/2022 Abdominal x-ray obtained today 12/15 due to progressive abdominal distention.  It showed diffuse small bowel distention consistent with adynamic ileus.  Suspect caused by patient taking excessive Imodium. 12/16: upright KUB shows persistent to slightly worsened small bowel dilated loops, ileus vs SBO 12/17: surgery repeating CT scan today given return of bowel function but persistent pain and distention. Started antibiotics for CT evidence of enteritis of terminal ileum. --General surgery following and diet advanced and by 12/20, tolerating solid p.o.  Normal anion gap metabolic acidosis Resolved. Initially in the setting of renal failure and diarrhea.  Patient is off bicarb drip.  Given PO oral bicarb x 2 days. Bicarb has normalized.  Abdominal pain Abdominal distention due to Ileus.  Initial CT abd/pelvis was unrevealing as to cause.  Concern for ascites but no hx of liver disease, only trace ascites on U/S. Stool studies canceled as patient no longer having diarrhea. Now with ileus - mgmt as outlined. Pain control as needed  12/17 - started Cipro/Flagyl after repeat CT findings of enteritis of terminal ileum. --Continue Cipro/Flagyl, and his diet was able to be advanced, patient will be discharged on total of 10 days of antibiotics as per surgery  recommendations.  Diet advanced and she is able to tolerate food without abdominal pain, nausea vomiting or diarrhea  UTI (urinary tract infection)-resolved as of 02/05/2022 Urine culture on admission with pan-sensitive E coli.  UTI could  explain patient's symptoms, but seem out of proportion, concern for acute GI illness / infection. -- Completed 3 days IV ampicillin   Hypomagnesemia-resolved as of 02/05/2022 Mg 1.4 on 12/14 was replaced. Monitor Mg, replace PRN. 12/17: Mg 1.6, IV replacement ordered.  Severe sepsis (HCC)-resolved as of 02/03/2022 Pt met SIRS criteria on admission with tachycardia, leukocytosis, organ involvement is renal and respiratory.   Found to have UTI Sepsis physiology resolved.  --Completed antibiotics for UTI  --Started on Cipro/Flagyl 12/17 --Blood cultures negative, final.  AKI (acute kidney injury) (HCC)-resolved as of 02/05/2022 History of CKD stage 3a AKI now resolved with IV fluids. Suspect prerenal azotemia given diarrhea and hypotension.  Nephrology consulted in the ED. initially placed on bicarb drip  Hyperkalemia-resolved as of 02/01/2022 Initial K was 7.5, treated aggressively in the ED. Due to renal failure.  Normal on day of discharge  Diarrhea-resolved as of 02/05/2022 Presented with marked leukocytosis and acute kidney injury.  Symptoms resolved following admission.  Infectious workup negative.  Generalized weakness Due to acute illness, hypotension. PT evaluation if needed  Elevated troponin Due to demand ischemia in setting of hypotension, AKI. No chest pain or acute changes on EKG.  Sore throat-resolved as of 02/05/2022 With dry mucosa.  Pharynx difficult to visulaize.  --Started on Nystatin empirically on admisison - Cepacol (lozenges) as needed for sore throat   GERD (gastroesophageal reflux disease) Appears not on PPI or H2 blocker.  COPD (chronic obstructive pulmonary disease) (HCC) Stable, not exacerbated.  Bronchodilators  Depression with anxiety Continue home fluoxetine, alprazolam PRN  Hypertension Presented hypotensive, so home antihypertensives were held. BP's have now been elevated, in setting of persistent abdominal pain / ileus. --holding  home lisinopril, Lasix while using Toradol --start Coreg 6.25 mg BID, increased to 12.5 mg --PRN hydralazine  --Pain contributing, continue analgesics PRN  Type II diabetes mellitus with renal manifestations (HCC) Non-insulin-dependent. A1c pending. Hold metformin due to AKI Sliding scale Novolog  Protein-calorie malnutrition, severe Dietitian consulted  Hyponatremia-resolved as of 02/05/2022 Resolved with IV fluids, Na 131 >> 135 today With limited PO intake due to ileus, likely hypovolemic. Stop fluids and monitor for stability. Repeat BMP in AM.  Hypokalemia-resolved as of 02/05/2022 Initially with hyperkalemia in setting of AKI. K today normal.  Monitor BMP & replace K PRN         Consultants:  -General surgery -Nephrology Procedures performed: None Disposition: Home Diet recommendation:  Discharge Diet Orders (From admission, onward)     Start     Ordered   02/05/22 0000  Diet - low sodium heart healthy        02/05/22 1216           Cardiac diet DISCHARGE MEDICATION: Allergies as of 02/05/2022       Reactions   Almond Oil Hives   Atorvastatin Other (See Comments)   myalgias   Rosuvastatin Other (See Comments)   Muscle pain        Medication List     STOP taking these medications    multivitamin with minerals tablet   spironolactone 25 MG tablet Commonly known as: ALDACTONE       TAKE these medications    (feeding supplement) PROSource Plus liquid Take 30 mLs by mouth 3 (three) times daily between  meals.   ALPRAZolam 0.25 MG tablet Commonly known as: XANAX Take 1 tablet by mouth 2 times every day   ARIPiprazole 2 MG tablet Commonly known as: ABILIFY Take 0.5 tablets (1 mg total) by mouth daily for 2 weeks, then increase to 1 tablet daily   aspirin EC 81 MG tablet Take 1 tablet (81 mg total) by mouth daily.   butalbital-acetaminophen-caffeine 50-325-40 MG tablet Commonly known as: FIORICET Take 1 to 2 tablets by mouth  every 4 hours as needed . Not to exceed 6 tablets in 24 hours (take 1 - 2 tablet by oral route  every 4 hours as needed not to exceed 6 tablets per 24hrs)   carvedilol 6.25 MG tablet Commonly known as: COREG Take 1 tablet (6.25 mg total) by mouth 2 (two) times daily.   ciprofloxacin 500 MG tablet Commonly known as: Cipro Take 1 tablet (500 mg total) by mouth 2 (two) times daily for 7 days.   FLUoxetine 20 MG capsule Commonly known as: PROZAC Take 1 capsule by mouth every day in the morning   furosemide 40 MG tablet Commonly known as: LASIX TAKE 1 TABLET BY MOUTH TWICE DAILY   ibuprofen 200 MG tablet Commonly known as: ADVIL Take 600 mg by mouth every 4 (four) hours as needed for mild pain or moderate pain.   lisinopril 20 MG tablet Commonly known as: ZESTRIL Take 1 tablet by mouth once daily (take 1 tablet by oral route  every day)   metroNIDAZOLE 500 MG tablet Commonly known as: Flagyl Take 1 tablet (500 mg total) by mouth 2 (two) times daily for 7 days.   ondansetron 4 MG tablet Commonly known as: Zofran Take 1 tablet (4 mg total) by mouth daily as needed for nausea or vomiting.   oxyCODONE 5 MG immediate release tablet Commonly known as: Oxy IR/ROXICODONE Take 1-2 tablets (5-10 mg total) by mouth every 4 (four) hours as needed for moderate pain or severe pain.   polyethylene glycol 17 g packet Commonly known as: MIRALAX / GLYCOLAX Take 17 g by mouth daily. Hold if loose or frequent stools.   potassium chloride SA 20 MEQ tablet Commonly known as: KLOR-CON M Take 1 tablet (20 mEq total) by mouth 2 (two) times daily.   senna-docusate 8.6-50 MG tablet Commonly known as: Senokot-S Take 1 tablet by mouth 2 (two) times daily. Hold if loose or frequent stools.        Follow-up Information     [facility] Follow up.   Specialty: General Surgery Why: As needed Contact information: [place] [phone]               Discharge Exam: Danley Danker Weights   01/28/22 1440  Weight: 74.4 kg   General: Alert and oriented x 3, no acute distress Cardiovascular: Regular rate and rhythm, S1-S2 Abdomen: Soft, distended, nontender, hypoactive bowel sounds  Condition at discharge: good  The results of significant diagnostics from this hospitalization (including imaging, microbiology, ancillary and laboratory) are listed below for reference.   Imaging Studies: DG Abd 2 Views  Result Date: 02/04/2022 CLINICAL DATA:  Ileus EXAM: ABDOMEN - 2 VIEW COMPARISON:  CT 02/02/2022 FINDINGS: Persistent small bowel dilation, not significantly changed from recent CT. Normal caliber colon. No evidence of French peroneal gas. IMPRESSION: Persistent small bowel obstruction or ileus. Electronically Signed   By: Maurine Simmering M.D.   On: 02/04/2022 08:41   CT ABDOMEN PELVIS W CONTRAST  Result  Date: 02/02/2022 CLINICAL DATA:  Abdominal pain, clinical suspicion for bowel obstruction EXAM: CT ABDOMEN AND PELVIS WITH CONTRAST TECHNIQUE: Multidetector CT imaging of the abdomen and pelvis was performed using the standard protocol following bolus administration of intravenous contrast. RADIATION DOSE REDUCTION: This exam was performed according to the departmental dose-optimization program which includes automated exposure control, adjustment of the mA and/or kV according to patient size and/or use of iterative reconstruction technique. CONTRAST:  169m OMNIPAQUE IOHEXOL 300 MG/ML  SOLN COMPARISON:  01/28/2022 FINDINGS: Lower chest: Small linear densities in the lower lung fields may suggest minimal scarring or minimal subsegmental atelectasis. Hepatobiliary: There is decreased density in liver in comparison to the spleen suggesting possible fatty infiltration. No focal abnormalities are seen. Surgical clips are seen in gallbladder fossa. There is no significant dilation of bile ducts. Pancreas: There is fatty  infiltration. No focal abnormalities are seen. Spleen: Unremarkable. Adrenals/Urinary Tract: There is mild hyperplasia of adrenals without significant interval change. There is no hydronephrosis. There are no renal or ureteral stones. 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 There are possible few tiny subcentimeter renal cysts on both sides. Ureters are not dilated. Urinary bladder is unremarkable. Stomach/Bowel: Small hiatal hernia is seen. There is mild wall thickening in the lower thoracic esophagus, possibly suggesting reflux esophagitis. Stomach is not distended. Small bowel loops are dilated measuring up to 4.1 cm. There is no dilation of terminal ileum. There is mild diffuse wall thickening in terminal ileum. Zone of transition appears to be in right mid abdomen. Appendix is not distinctly seen. There is stranding and fluid in mesentery adjacent to the terminal ileum. Liquid stool is seen in the lumen of ascending and transverse colon. Scattered diverticula are seen in left colon without signs of focal diverticulitis. Vascular/Lymphatic: Scattered atherosclerotic plaques and calcifications are seen in aorta and its major branches. Reproductive: Unremarkable. Other: There is no pneumoperitoneum.  Minimal ascites is present. Musculoskeletal: No acute findings are seen. IMPRESSION: There is abnormal dilation of small-bowel loops and incomplete distention of distal/terminal ileum. There is mild diffuse wall thickening in the distal/terminal ileum. Findings suggest inflammatory or infectious enteritis with partial small bowel obstruction. Zone of transition of the small bowel diameter is seen in right mid abdomen. There is no hydronephrosis. Diverticulosis of colon without signs of focal acute diverticulitis. Small hiatal hernia. Wall thickening in the lower thoracic esophagus may suggest reflux esophagitis. 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 There are few subcentimeter renal cysts. Other findings as described in the body of the report. Electronically Signed   By: PElmer PickerM.D.   On: 02/02/2022 14:34   DG Abd 1 View  Result Date: 02/01/2022 CLINICAL DATA:  Abdominal pain, ileus EXAM: ABDOMEN - 1 VIEW COMPARISON:  01/21/2022 FINDINGS: There are numerous dilated loops of small bowel throughout the abdomen similar to slightly increased in caliber compared to the previous day's study. Multiple air-fluid levels. No gross free intraperitoneal air. IMPRESSION: Persistent dilated loops of small bowel throughout the abdomen similar to slightly increased in caliber compared to the previous day's study. Although this may be secondary to ileus, small bowel obstruction is a concern. Consider further evaluation with CT or small-bowel follow-through. Electronically Signed   By: NDavina PokeD.O.   On: 02/01/2022 09:14   UKoreaAbdomen Complete  Result Date: 01/31/2022 CLINICAL DATA:  Abdominal distension.  Prior cholecystectomy. EXAM: ABDOMEN ULTRASOUND COMPLETE COMPARISON:  CT  abdomen 01/28/2022. FINDINGS: Gallbladder: Status post cholecystectomy. Common bile duct: Diameter: 5 mm. No intrahepatic biliary dilatation. Liver: The hepatic echogenicity appears mildly increased and slightly heterogeneous. No focal lesions are identified. Portal vein is patent on color Doppler imaging with normal direction of blood flow towards the liver. IVC: No abnormality visualized. Pancreas: Largely obscured by bowel gas and suboptimally evaluated. Spleen: Size and appearance within normal limits. Right Kidney: Length: 9.0 cm. Echogenicity within normal limits. No mass or hydronephrosis visualized. Left Kidney: Length: 10.5 cm. Echogenicity within normal limits. No mass or hydronephrosis visualized. Abdominal aorta: No aneurysm visualized. Other findings: Trace ascites. IMPRESSION: 1. No acute process identified. 2. Trace  ascites. 3. Status post cholecystectomy. Electronically Signed   By: Richardean Sale M.D.   On: 01/31/2022 09:50   DG Abd 1 View  Result Date: 01/31/2022 CLINICAL DATA:  Abdominal pain and distension. History of pancreatitis, chronic kidney disease and diabetes. EXAM: ABDOMEN - 1 VIEW COMPARISON:  Radiographs 06/04/2013.  CT 01/28/2022. FINDINGS: 0822 hours. Two supine views of the abdomen. There is mild diffuse small bowel distension. There is air within the colon which is normal in caliber. No significant colonic distension. Cholecystectomy clips and scattered benign abdominal calcifications are unchanged. The bones appear unremarkable. No supine evidence of free intraperitoneal air. IMPRESSION: Mild diffuse small bowel distension, most consistent with adynamic ileus. If concern of early bowel obstruction, recommend radiographic follow-up with erect views or CT. Electronically Signed   By: Richardean Sale M.D.   On: 01/31/2022 08:58   CT CHEST ABDOMEN PELVIS WO CONTRAST  Result Date: 01/28/2022 CLINICAL DATA:  Abdominal pain and distension with diarrhea the last 4 days. EXAM: CT CHEST, ABDOMEN AND PELVIS WITHOUT CONTRAST TECHNIQUE: Multidetector CT imaging of the chest, abdomen and pelvis was performed following the standard protocol without IV contrast. RADIATION DOSE REDUCTION: This exam was performed according to the departmental dose-optimization program which includes automated exposure control, adjustment of the mA and/or kV according to patient size and/or use of iterative reconstruction technique. COMPARISON:  06/20/2021 and 06/15/2021 FINDINGS: CT CHEST FINDINGS Cardiovascular: Circumflex coronary artery atherosclerosis. Mild atherosclerotic calcification of the descending thoracic aorta. Mild cardiomegaly. Mediastinum/Nodes: Right upper paratracheal node 0.7 cm in short axis on image 10 series 2, within normal limits. No pathologic adenopathy observed. Lungs/Pleura: Mild dependent atelectasis  or scarring posteriorly in both upper lobes. Mild atelectasis in both lower lobes and in the lingula. Moderate atelectasis in the right middle lobe. Musculoskeletal: Lower cervical plate and screw fixator. Degenerative arthropathy of the glenohumeral joints. Mild thoracic spondylosis. CT ABDOMEN PELVIS FINDINGS Hepatobiliary: Cholecystectomy. Chronic calcification posteriorly in the right hepatic lobe, likely postinflammatory. Diffuse hepatic steatosis. Pancreas: Unremarkable Spleen: Unremarkable Adrenals/Urinary Tract: Mild fullness of both adrenal glands without a discrete mass. Renal contours unremarkable, no urinary tract calculi observed. Urinary bladder appears normal. Stomach/Bowel: Sigmoid colon diverticulosis. Relatively empty distal large bowel with an air-fluid level at the splenic flexure noted, no well-defined formed stool in the colon. Similar to the 06/15/2021 exam there is a short tubular structure extending from the vicinity of the cecum presumably representing coiled appendix, this is of normal caliber proximally, but the distal portion is difficult to assess due to the coiling, but not substantially different from 06/15/2021. No dilated small bowel to indicate small bowel obstruction. Vascular/Lymphatic: Atherosclerosis is present, including aortoiliac atherosclerotic disease. No pathologic adenopathy. Reproductive: Unremarkable Other: No supplemental non-categorized findings. Musculoskeletal: Unremarkable IMPRESSION: 1. Air fluid level in the splenic flexure the colon with relatively empty distal  colon, cannot exclude diarrheal process. 2. Sigmoid colon diverticulosis without active diverticulitis. 3. Scattered mostly subsegmental atelectasis in the lungs. 4. Mild cardiomegaly. 5. Diffuse hepatic steatosis. 6. A tubular structure extending from the vicinity of the cecum presumably represents the coiled appendix, this is of normal caliber proximally, but the distal portion is difficult to assess  due to the coiling, but this is not substantially different from 06/15/2021. 7. Aortic and circumflex coronary artery atherosclerosis. Aortic Atherosclerosis (ICD10-I70.0). Electronically Signed   By: Van Clines M.D.   On: 01/28/2022 15:52   DG Chest Port 1 View  Result Date: 01/28/2022 CLINICAL DATA:  Questionable sepsis - evaluate for abnormality EXAM: PORTABLE CHEST 1 VIEW COMPARISON:  Chest x-ray 06/19/2021, 01/28/2022 FINDINGS: The heart and mediastinal contours are unchanged. Lingular linear atelectasis versus scarring. Right streaky airspace opacities consistent with atelectasis and elevated right hemidiaphragm. No focal consolidation. No pulmonary edema. No pleural effusion. No pneumothorax. No acute osseous abnormality.  Cervical spine surgical hardware. IMPRESSION: No active disease. Electronically Signed   By: Iven Finn M.D.   On: 01/28/2022 15:48    Microbiology: Results for orders placed or performed during the hospital encounter of 01/28/22  Blood Culture (routine x 2)     Status: None   Collection Time: 01/28/22  3:27 PM   Specimen: BLOOD  Result Value Ref Range Status   Specimen Description BLOOD BLOOD LEFT ARM  Final   Special Requests   Final    BOTTLES DRAWN AEROBIC AND ANAEROBIC Blood Culture adequate volume   Culture   Final    NO GROWTH 5 DAYS Performed at [facility], [address] [place], [place] [address]    Report Status 02/02/2022 FINAL  Final  Urine Culture     Status: Abnormal   Collection Time: 01/28/22  5:31 PM   Specimen: Urine, Random  Result Value Ref Range Status   Specimen Description   Final    URINE, RANDOM Performed at [facility], [address], [place], [place] [address]    Special Requests   Final    NONE Performed at [facility], [address] [place], [place] [address]    Culture >=100,000 COLONIES/mL ESCHERICHIA COLI (A)  Final   Report Status 01/30/2022 FINAL  Final   Organism ID,  Bacteria ESCHERICHIA COLI (A)  Final      Susceptibility   Escherichia coli - MIC*    AMPICILLIN <=2 SENSITIVE Sensitive     CEFAZOLIN <=4 SENSITIVE Sensitive     CEFEPIME <=0.12 SENSITIVE Sensitive     CEFTRIAXONE <=0.25 SENSITIVE Sensitive     CIPROFLOXACIN <=0.25 SENSITIVE Sensitive     GENTAMICIN <=1 SENSITIVE Sensitive     IMIPENEM <=0.25 SENSITIVE Sensitive     NITROFURANTOIN <=16 SENSITIVE Sensitive     TRIMETH/SULFA <=20 SENSITIVE Sensitive     AMPICILLIN/SULBACTAM <=2 SENSITIVE Sensitive     PIP/TAZO <=4 SENSITIVE Sensitive     * >=100,000 COLONIES/mL ESCHERICHIA COLI  Blood Culture (routine x 2)     Status: None   Collection Time: 01/28/22  5:49 PM   Specimen: BLOOD  Result Value Ref Range Status   Specimen Description BLOOD BLOOD RIGHT HAND [facility]  Final   Special Requests   Final    BOTTLES DRAWN AEROBIC ONLY Blood Culture results may not be optimal due to an inadequate volume of blood received in culture bottles   Culture   Final    NO GROWTH 5 DAYS Performed at [facility], [place], [place] [address]    Report Status 02/02/2022 FINAL  Final  Resp panel by RT-PCR (RSV, Flu A&B, Covid) Anterior Nasal Swab     Status: None   Collection Time: 01/28/22  6:40 PM   Specimen: Anterior Nasal Swab  Result Value Ref Range Status   SARS Coronavirus 2 by RT PCR NEGATIVE NEGATIVE Final    Comment: (NOTE) SARS-CoV-2 target nucleic acids are NOT DETECTED.  The SARS-CoV-2 RNA is generally detectable in upper respiratory specimens during the acute phase of infection. The lowest concentration of SARS-CoV-2 viral copies this assay can detect is 138 copies/mL. A negative result does not preclude SARS-Cov-2 infection and should not be used as the sole basis for treatment or other patient management decisions. A negative result may occur with  improper specimen collection/handling, submission of specimen other than nasopharyngeal swab, presence of viral  mutation(s) within the areas targeted by this assay, and inadequate number of viral copies(<138 copies/mL). A negative result must be combined with clinical observations, patient history, and epidemiological information. The expected result is Negative.  Fact Sheet for Patients:  EntrepreneurPulse.com.au  Fact Sheet for Healthcare Providers:  IncredibleEmployment.be  This test is no t yet approved or cleared by the [place] FDA and  has been authorized for detection and/or diagnosis of SARS-CoV-2 by FDA under an Emergency Use Authorization (EUA). This EUA will remain  in effect (meaning this test can be used) for the duration of the COVID-19 declaration under Section 564(b)(1) of the Act, 21 U.S.C.section 360bbb-3(b)(1), unless the authorization is terminated  or revoked sooner.       Influenza A by PCR NEGATIVE NEGATIVE Final   Influenza B by PCR NEGATIVE NEGATIVE Final    Comment: (NOTE) The Xpert Xpress SARS-CoV-2/FLU/RSV plus assay is intended as an aid in the diagnosis of influenza from Nasopharyngeal swab specimens and should not be used as a sole basis for treatment. Nasal washings and aspirates are unacceptable for Xpert Xpress SARS-CoV-2/FLU/RSV testing.  Fact Sheet for Patients: EntrepreneurPulse.com.au  Fact Sheet for Healthcare Providers: IncredibleEmployment.be  This test is not yet approved or cleared by the [place] FDA and has been authorized for detection and/or diagnosis of SARS-CoV-2 by FDA under an Emergency Use Authorization (EUA). This EUA will remain in effect (meaning this test can be used) for the duration of the COVID-19 declaration under Section 564(b)(1) of the Act, 21 U.S.C. section 360bbb-3(b)(1), unless the authorization is terminated or revoked.     Resp Syncytial Virus by PCR NEGATIVE NEGATIVE Final    Comment: (NOTE) Fact Sheet for  Patients: EntrepreneurPulse.com.au  Fact Sheet for Healthcare Providers: IncredibleEmployment.be  This test is not yet approved or cleared by the [place] FDA and has been authorized for detection and/or diagnosis of SARS-CoV-2 by FDA under an Emergency Use Authorization (EUA). This EUA will remain in effect (meaning this test can be used) for the duration of the COVID-19 declaration under Section 564(b)(1) of the Act, 21 U.S.C. section 360bbb-3(b)(1), unless the authorization is terminated or revoked.  Performed at [facility], [address] [place], [place] [address]     Labs: CBC: Recent Labs  Lab 01/31/22 [phone] 02/01/22 [phone] 02/02/22 0440 02/03/22 0517 02/04/22 0325 02/05/22 0635  WBC 21.2* 17.4* 18.4* 18.5* 15.2* 16.1*  NEUTROABS 17.4*  --   --   --   --   --   HGB 13.2 11.7* 10.8* 10.6* 10.4* 9.6*  HCT 41.5 38.2 35.1* 33.9* 33.9* 31.6*  MCV 83.0 83.2 84.4 84.3 85.6 86.8  PLT 509* 426* 394 386 370 607*   Basic Metabolic Panel: Recent Labs  Lab 01/30/22 0626 01/31/22 0646 02/01/22 0613 02/02/22 0440 02/03/22 0517 02/04/22 0325 02/05/22 0635  NA 137 135 135 133* 131* 135 135  K 3.6 3.4* 3.4* 3.5 3.6 4.0 4.2  CL 114* 108 109 107 104 107 107  CO2 14* 18* 20* 20* 21* 24 21*  GLUCOSE 127* 154* 112* 146* 184* 125* 121*  BUN 40* 30* 30* 26* _0 CREATININE 1.32* 1.14* 1.02* 0.97 0.86 0.89 0.98  CALCIUM 8.1* 8.8* 8.6* 8.1* 8.3* 8.4* 8.2*  MG 1.4* 2.1  --  1.6* 1.7 1.9  --   PHOS  --   --  3.6  --   --   --   --    Liver Function Tests: Recent Labs  Lab 01/30/22 0626  AST 10*  ALT 8  ALKPHOS 121  BILITOT 0.5  PROT 6.6  ALBUMIN 2.8*   CBG: Recent Labs  Lab 02/04/22 1205 02/04/22 1702 02/04/22 2140 02/05/22 0731 02/05/22 1143  GLUCAP 164* 138* 112* 121* 147*    Discharge time spent: less than 30 minutes.  Signed: Annita Brod, MD [facility] 02/05/2022

## 2022-02-05 NOTE — Progress Notes (Signed)
[facility] SURGICAL PROGRESS NOTE (cpt [phone])  Hospital Day(s): 8.   Interval History: Patient seen and examined, no acute events or new complaints overnight. Patient reports she feels great. She denied any abdominal pain, nausea, emesis. Leukocytosis stable; 16.1K. Renal function normal; sCr - 0.98; UO - 400 ccs + unmeasured. No significant electrolyte derangements. Now on Cipro + Flagyl for possible enteritis. She does have recorded BM and is passing flatus. Now on soft diet; tolerating well.   Review of Systems:  Constitutional: denies fever, chills  HEENT: denies cough or congestion  Respiratory: denies any shortness of breath  Cardiovascular: denies chest pain or palpitations  Gastrointestinal: denied abdominal pain; denied N/V Genitourinary: denies burning with urination or urinary frequency Musculoskeletal: denies pain, decreased motor or sensation  Vital signs in last 24 hours: [min-max] current  Temp:  [97.8 F (36.6 C)-98.9 F (37.2 C)] 98.9 F (37.2 C) (12/20 0730) Pulse Rate:  [66-82] 82 (12/20 0730) Resp:  [16-20] 16 (12/20 0730) BP: (122-158)/(67-85) 129/79 (12/20 0730) SpO2:  [99 %-100 %] 100 % (12/20 0730)     Height: 5\' 4"  (162.6 cm) Weight: 74.4 kg BMI (Calculated): 28.14   Intake/Output last 2 shifts:  12/19 0701 - 12/20 0700 In: 2920 [P.O.:120; I.V.:1500; IV Piggyback:1300] Out: 400 [Urine:400]   Physical Exam:  Constitutional: alert, cooperative and no distress  HENT: normocephalic without obvious abnormality  Eyes: PERRL, EOM's grossly intact and symmetric  Respiratory: breathing non-labored at rest  Cardiovascular: regular rate and sinus rhythm  Gastrointestinal: Soft, non-tender, still mildly distended, no rebound/guarding Musculoskeletal: no edema or wounds, motor and sensation grossly intact, NT    Labs:     Latest Ref Rng & Units 02/05/2022    6:35 AM 02/04/2022    3:25 AM 02/03/2022    5:17 AM  CBC  WBC 4.0 - 10.5 K/uL 16.1   15.2  18.5   Hemoglobin 12.0 - 15.0 g/dL 9.6  02/05/2022  00.7   Hematocrit 36.0 - 46.0 % 31.6  33.9  33.9   Platelets 150 - 400 K/uL 406  370  386       Latest Ref Rng & Units 02/04/2022    3:25 AM 02/03/2022    5:17 AM 02/02/2022    4:40 AM  CMP  Glucose 70 - 99 mg/dL 02/04/2022  633  354   BUN 8 - 23 mg/dL 15  17  26    Creatinine 0.44 - 1.00 mg/dL 562   5.63   Sodium 135 - 145 mmol/L 135  131  133   Potassium 3.5 - 5.1 mmol/L 4.0  3.6  3.5   Chloride 98 - 111 mmol/L 107  104  107   CO2 22 - 32 mmol/L 24  21  20    Calcium 8.9 - 10.3 mg/dL 8.4  8.3  8.1     Imaging studies: No new pertinent imaging studies   Assessment/Plan: (ICD-10's: K61.609) 61 y.o. female with possible enteritis vs ileus vs SBO   - Okay to continue soft diet; recommend following this at home; will provide information regarding this   - Continue Abx (Cipro / Flagyl); complete PO course at home - Monitor abdominal examination; on-going bowel function - Pain control prn; antiemetics prn - Mobilize  - Further management per primary service   - Discharge Planning:  Slight bump in leukocytosis but otherwise doing well clinically with ROBF and toleration of diet advancement. From surgical perspective, I think it is reasonable to DC home with PO  Abx for 7-10 total (IV + PO). Recommend following a softer diet for the next few days (will leave handout). She does not need to follow up with [place], but I will leave our contact information. Please call with questions.[email or web address].   All of the above findings and recommendations were discussed with the patient, and the medical team, and all of patient's questions were answered to her expressed satisfaction.  -- Lynden Oxford, PA-C [facility] 02/05/2022, 7:37 AM M-F: 7am - 4pm

## 2022-02-05 NOTE — Progress Notes (Signed)
Reviewed discharge instructions with pt. Pt verbalized understanding. Pt discharged with all personal belongings. Staff wheeled pt out. Pt transported to home via family/friend vehicle.

## 2022-02-05 NOTE — Progress Notes (Signed)
Nutrition Follow-up  DOCUMENTATION CODES:   Not applicable  INTERVENTION:   -Continue MVI with minerals daily -Continue Glucerna Shake po TID, each supplement provides 220 kcal and 10 grams of protein  -Continue 30 ml Prosource Plus TID, each supplement provides 100 kcals and 15 grams protein  NUTRITION DIAGNOSIS:   Increased nutrient needs related to poor appetite as evidenced by meal completion < 25%.  Ongoing  GOAL:   Patient will meet greater than or equal to 90% of their needs  Progressing   MONITOR:   PO intake, Supplement acceptance  REASON FOR ASSESSMENT:   Consult Assessment of nutrition requirement/status  ASSESSMENT:   Pt with history of pancreatitis, CKD, CAD with prior history of MI, depression, hyperlipidemia, hypertension, rheumatoid arthritis, COPD, non-insulin-dependent diabetes mellitus type 2, who presents for chief concerns of abdominal pain and diarrhea.  Reviewed I/O's: +2.5 L x 24 hours and -1.3 L since admission  UOP: 400 ml x 24 hours   Per general surgery notes, CT of abdomen and pelvis on 02/02/22 suggestive of enteritis.   Pt currently on a soft diet. No meal completions data available to assess at this time. Pt is taking supplements. Per general surgery notes, likely d/c home today.   Medications reviewed and include miralax, senokot, and cipro.   Labs reviewed: CBGS: 112-138 (inpatient orders for glycemic control are 0-5 units insulin aspart daily at bedtime and 0-9 units insulin aspart TID with meals).    Diet Order:   Diet Order             DIET SOFT Room service appropriate? Yes; Fluid consistency: Thin  Diet effective now                   EDUCATION NEEDS:   Education needs have been addressed  Skin:  Skin Assessment: Reviewed RN Assessment  Last BM:  02/04/22  Height:   Ht Readings from Last 1 Encounters:  01/28/22 5\' 4"  (1.626 m)    Weight:   Wt Readings from Last 1 Encounters:  01/28/22 74.4 kg     Ideal Body Weight:  54.5 kg  BMI:  Body mass index is 28.15 kg/m.  Estimated Nutritional Needs:   Kcal:  1650-1850  Protein:  85-100 grams  Fluid:  > 1.6 L    14/12/23, RD, LDN, CDCES Registered Dietitian II Certified Diabetes Care and Education Specialist Please refer to [facility] for RD and/or RD on-call/weekend/after hours pager

## 2022-02-11 ENCOUNTER — Other Ambulatory Visit: Payer: Self-pay

## 2022-02-12 ENCOUNTER — Other Ambulatory Visit: Payer: Self-pay

## 2022-02-13 ENCOUNTER — Other Ambulatory Visit: Payer: Self-pay

## 2022-02-13 MED ORDER — BUTALBITAL-APAP-CAFFEINE 50-325-40 MG PO TABS
1.0000 | ORAL_TABLET | Freq: Three times a day (TID) | ORAL | 1 refills | Status: DC | PRN
  Filled 2022-02-13: qty 90, 30d supply, fill #0
  Filled 2022-03-17: qty 90, 30d supply, fill #1

## 2022-02-14 ENCOUNTER — Other Ambulatory Visit: Payer: Self-pay

## 2022-03-04 ENCOUNTER — Other Ambulatory Visit: Payer: Self-pay

## 2022-03-05 ENCOUNTER — Other Ambulatory Visit: Payer: Self-pay

## 2022-03-06 ENCOUNTER — Other Ambulatory Visit: Payer: Self-pay

## 2022-03-07 ENCOUNTER — Other Ambulatory Visit: Payer: Self-pay

## 2022-03-11 ENCOUNTER — Other Ambulatory Visit: Payer: Self-pay

## 2022-03-11 MED ORDER — ALPRAZOLAM 0.5 MG PO TABS
0.5000 mg | ORAL_TABLET | Freq: Every day | ORAL | 0 refills | Status: DC
  Filled 2022-03-12: qty 30, 30d supply, fill #0

## 2022-03-12 ENCOUNTER — Other Ambulatory Visit: Payer: Self-pay

## 2022-03-13 ENCOUNTER — Other Ambulatory Visit: Payer: Self-pay

## 2022-03-17 ENCOUNTER — Other Ambulatory Visit: Payer: Self-pay

## 2022-03-31 ENCOUNTER — Other Ambulatory Visit: Payer: Self-pay

## 2022-03-31 MED ORDER — ALPRAZOLAM 0.5 MG PO TABS
0.5000 mg | ORAL_TABLET | Freq: Every day | ORAL | 0 refills | Status: DC
  Filled 2022-04-09: qty 30, 30d supply, fill #0

## 2022-03-31 MED ORDER — FLUOXETINE HCL 20 MG PO CAPS
20.0000 mg | ORAL_CAPSULE | Freq: Every morning | ORAL | 5 refills | Status: DC
  Filled 2022-03-31: qty 30, 30d supply, fill #0
  Filled 2022-05-05: qty 30, 30d supply, fill #1

## 2022-03-31 MED ORDER — BUTALBITAL-APAP-CAFFEINE 50-325-40 MG PO TABS
1.0000 | ORAL_TABLET | Freq: Three times a day (TID) | ORAL | 1 refills | Status: DC | PRN
  Filled 2022-04-14: qty 90, 30d supply, fill #0
  Filled 2022-05-12: qty 90, 30d supply, fill #1

## 2022-04-01 ENCOUNTER — Other Ambulatory Visit: Payer: Self-pay

## 2022-04-01 MED ORDER — FLUOXETINE HCL 20 MG PO CAPS
20.0000 mg | ORAL_CAPSULE | Freq: Every morning | ORAL | 5 refills | Status: DC
  Filled 2022-06-03: qty 30, 30d supply, fill #0
  Filled 2022-08-14: qty 30, 30d supply, fill #1
  Filled 2022-09-11: qty 30, 30d supply, fill #2
  Filled 2022-10-09: qty 30, 30d supply, fill #3
  Filled 2022-11-06: qty 30, 30d supply, fill #4
  Filled 2022-12-02 (×2): qty 30, 30d supply, fill #5

## 2022-04-02 ENCOUNTER — Other Ambulatory Visit: Payer: Self-pay

## 2022-04-04 ENCOUNTER — Other Ambulatory Visit: Payer: Self-pay

## 2022-04-04 DIAGNOSIS — G43909 Migraine, unspecified, not intractable, without status migrainosus: Secondary | ICD-10-CM | POA: Insufficient documentation

## 2022-04-04 DIAGNOSIS — G894 Chronic pain syndrome: Secondary | ICD-10-CM | POA: Insufficient documentation

## 2022-04-04 DIAGNOSIS — F419 Anxiety disorder, unspecified: Secondary | ICD-10-CM | POA: Insufficient documentation

## 2022-04-04 DIAGNOSIS — I252 Old myocardial infarction: Secondary | ICD-10-CM | POA: Insufficient documentation

## 2022-04-04 MED ORDER — BUPRENORPHINE HCL-NALOXONE HCL 8-2 MG SL FILM
1.0000 | ORAL_FILM | Freq: Two times a day (BID) | SUBLINGUAL | 0 refills | Status: DC
  Filled 2022-04-04: qty 28, 14d supply, fill #0

## 2022-04-04 MED ORDER — NALOXONE HCL 4 MG/0.1ML NA LIQD
1.0000 | NASAL | 1 refills | Status: AC | PRN
  Filled 2022-04-04: qty 2, 30d supply, fill #0

## 2022-04-07 ENCOUNTER — Other Ambulatory Visit: Payer: Self-pay

## 2022-04-09 ENCOUNTER — Other Ambulatory Visit: Payer: Self-pay

## 2022-04-10 ENCOUNTER — Other Ambulatory Visit: Payer: Self-pay

## 2022-04-14 ENCOUNTER — Other Ambulatory Visit: Payer: Self-pay

## 2022-04-28 ENCOUNTER — Other Ambulatory Visit: Payer: Self-pay

## 2022-04-28 DIAGNOSIS — G894 Chronic pain syndrome: Secondary | ICD-10-CM | POA: Diagnosis not present

## 2022-04-28 DIAGNOSIS — F419 Anxiety disorder, unspecified: Secondary | ICD-10-CM | POA: Diagnosis not present

## 2022-04-28 DIAGNOSIS — F1190 Opioid use, unspecified, uncomplicated: Secondary | ICD-10-CM | POA: Diagnosis not present

## 2022-04-28 MED ORDER — BUPRENORPHINE HCL-NALOXONE HCL 8-2 MG SL FILM
1.0000 | ORAL_FILM | Freq: Two times a day (BID) | SUBLINGUAL | 0 refills | Status: DC
  Filled 2022-04-28: qty 14, 7d supply, fill #0

## 2022-04-29 ENCOUNTER — Other Ambulatory Visit: Payer: Self-pay

## 2022-05-01 DIAGNOSIS — F1190 Opioid use, unspecified, uncomplicated: Secondary | ICD-10-CM | POA: Diagnosis not present

## 2022-05-01 DIAGNOSIS — G894 Chronic pain syndrome: Secondary | ICD-10-CM | POA: Diagnosis not present

## 2022-05-02 ENCOUNTER — Other Ambulatory Visit: Payer: Self-pay

## 2022-05-02 MED ORDER — BUPRENORPHINE HCL-NALOXONE HCL 8-2 MG SL FILM
1.0000 | ORAL_FILM | Freq: Two times a day (BID) | SUBLINGUAL | 0 refills | Status: DC
  Filled 2022-05-02: qty 14, 7d supply, fill #0

## 2022-05-05 ENCOUNTER — Other Ambulatory Visit: Payer: Self-pay

## 2022-05-05 MED ORDER — ARIPIPRAZOLE 2 MG PO TABS
2.0000 mg | ORAL_TABLET | Freq: Every day | ORAL | 5 refills | Status: DC
  Filled 2022-05-05: qty 30, 30d supply, fill #0
  Filled 2022-06-03: qty 30, 30d supply, fill #1
  Filled 2022-07-06: qty 30, 30d supply, fill #2
  Filled 2022-08-06: qty 30, 30d supply, fill #3
  Filled 2022-09-02: qty 30, 30d supply, fill #4
  Filled 2022-10-09: qty 30, 30d supply, fill #5

## 2022-05-06 ENCOUNTER — Other Ambulatory Visit: Payer: Self-pay

## 2022-05-06 MED ORDER — ALPRAZOLAM 0.5 MG PO TABS
0.5000 mg | ORAL_TABLET | Freq: Every day | ORAL | 0 refills | Status: DC
  Filled 2022-05-07: qty 30, 30d supply, fill #0

## 2022-05-07 ENCOUNTER — Other Ambulatory Visit: Payer: Self-pay

## 2022-05-08 ENCOUNTER — Other Ambulatory Visit: Payer: Self-pay

## 2022-05-08 MED ORDER — BUPRENORPHINE HCL-NALOXONE HCL 8-2 MG SL FILM
1.0000 | ORAL_FILM | Freq: Two times a day (BID) | SUBLINGUAL | 0 refills | Status: DC
  Filled 2022-05-12: qty 28, 14d supply, fill #0

## 2022-05-12 ENCOUNTER — Other Ambulatory Visit: Payer: Self-pay

## 2022-05-21 ENCOUNTER — Other Ambulatory Visit: Payer: Self-pay

## 2022-05-21 DIAGNOSIS — Z79899 Other long term (current) drug therapy: Secondary | ICD-10-CM | POA: Diagnosis not present

## 2022-05-21 DIAGNOSIS — G894 Chronic pain syndrome: Secondary | ICD-10-CM | POA: Diagnosis not present

## 2022-05-21 MED ORDER — BUPRENORPHINE HCL-NALOXONE HCL 8-2 MG SL FILM
1.0000 | ORAL_FILM | Freq: Two times a day (BID) | SUBLINGUAL | 0 refills | Status: DC
  Filled 2022-05-26: qty 60, 30d supply, fill #0

## 2022-05-22 ENCOUNTER — Other Ambulatory Visit: Payer: Self-pay

## 2022-05-26 ENCOUNTER — Other Ambulatory Visit (HOSPITAL_COMMUNITY): Payer: Self-pay

## 2022-05-26 ENCOUNTER — Other Ambulatory Visit: Payer: Self-pay

## 2022-05-27 ENCOUNTER — Other Ambulatory Visit: Payer: Self-pay

## 2022-06-03 ENCOUNTER — Other Ambulatory Visit: Payer: Self-pay

## 2022-06-04 ENCOUNTER — Other Ambulatory Visit: Payer: Self-pay

## 2022-06-05 ENCOUNTER — Other Ambulatory Visit: Payer: Self-pay

## 2022-06-05 MED ORDER — ALPRAZOLAM 0.5 MG PO TABS
0.5000 mg | ORAL_TABLET | Freq: Every day | ORAL | 0 refills | Status: DC
  Filled 2022-06-05: qty 30, 30d supply, fill #0

## 2022-06-06 ENCOUNTER — Other Ambulatory Visit: Payer: Self-pay

## 2022-06-11 ENCOUNTER — Other Ambulatory Visit (HOSPITAL_COMMUNITY): Payer: Self-pay

## 2022-06-11 ENCOUNTER — Other Ambulatory Visit: Payer: Self-pay

## 2022-06-11 MED ORDER — BUTALBITAL-APAP-CAFFEINE 50-325-40 MG PO TABS
1.0000 | ORAL_TABLET | Freq: Three times a day (TID) | ORAL | 1 refills | Status: DC | PRN
  Filled 2022-06-11: qty 90, 30d supply, fill #0
  Filled 2022-07-07: qty 90, 30d supply, fill #1

## 2022-06-11 MED ORDER — CYCLOBENZAPRINE HCL 10 MG PO TABS
10.0000 mg | ORAL_TABLET | Freq: Three times a day (TID) | ORAL | 0 refills | Status: DC
  Filled 2022-06-11: qty 90, 30d supply, fill #0

## 2022-06-17 ENCOUNTER — Other Ambulatory Visit: Payer: Self-pay

## 2022-06-17 MED ORDER — BUPRENORPHINE HCL-NALOXONE HCL 8-2 MG SL FILM
1.0000 | ORAL_FILM | Freq: Two times a day (BID) | SUBLINGUAL | 0 refills | Status: DC
  Filled 2022-06-26: qty 14, 7d supply, fill #0

## 2022-06-19 ENCOUNTER — Other Ambulatory Visit: Payer: Self-pay

## 2022-06-19 MED ORDER — ALPRAZOLAM 0.5 MG PO TABS
0.5000 mg | ORAL_TABLET | Freq: Two times a day (BID) | ORAL | 3 refills | Status: DC | PRN
  Filled 2022-06-19: qty 60, 30d supply, fill #0
  Filled 2022-07-17: qty 60, 30d supply, fill #1
  Filled 2022-08-14: qty 60, 30d supply, fill #2
  Filled 2022-09-11: qty 60, 30d supply, fill #3

## 2022-06-20 ENCOUNTER — Other Ambulatory Visit: Payer: Self-pay

## 2022-06-23 DIAGNOSIS — I10 Essential (primary) hypertension: Secondary | ICD-10-CM | POA: Diagnosis not present

## 2022-06-23 DIAGNOSIS — R600 Localized edema: Secondary | ICD-10-CM | POA: Insufficient documentation

## 2022-06-26 ENCOUNTER — Other Ambulatory Visit (HOSPITAL_COMMUNITY): Payer: Self-pay

## 2022-06-26 ENCOUNTER — Other Ambulatory Visit: Payer: Self-pay

## 2022-06-26 MED ORDER — SODIUM BICARBONATE 650 MG PO TABS
ORAL_TABLET | ORAL | 11 refills | Status: AC
  Filled 2022-06-26: qty 120, 30d supply, fill #0

## 2022-07-02 ENCOUNTER — Other Ambulatory Visit: Payer: Self-pay

## 2022-07-02 DIAGNOSIS — I10 Essential (primary) hypertension: Secondary | ICD-10-CM | POA: Diagnosis not present

## 2022-07-02 DIAGNOSIS — G894 Chronic pain syndrome: Secondary | ICD-10-CM | POA: Diagnosis not present

## 2022-07-02 DIAGNOSIS — Z79899 Other long term (current) drug therapy: Secondary | ICD-10-CM | POA: Diagnosis not present

## 2022-07-02 MED ORDER — BUPRENORPHINE HCL-NALOXONE HCL 8-2 MG SL FILM
1.0000 | ORAL_FILM | Freq: Two times a day (BID) | SUBLINGUAL | 0 refills | Status: DC
  Filled 2022-07-02: qty 60, 30d supply, fill #0

## 2022-07-03 ENCOUNTER — Other Ambulatory Visit: Payer: Self-pay

## 2022-07-07 ENCOUNTER — Other Ambulatory Visit: Payer: Self-pay

## 2022-07-08 ENCOUNTER — Other Ambulatory Visit: Payer: Self-pay

## 2022-07-09 ENCOUNTER — Other Ambulatory Visit: Payer: Self-pay

## 2022-07-10 ENCOUNTER — Other Ambulatory Visit: Payer: Self-pay

## 2022-07-16 ENCOUNTER — Other Ambulatory Visit: Payer: Self-pay

## 2022-07-17 ENCOUNTER — Other Ambulatory Visit: Payer: Self-pay

## 2022-07-31 ENCOUNTER — Other Ambulatory Visit: Payer: Self-pay

## 2022-07-31 MED ORDER — BUPRENORPHINE HCL-NALOXONE HCL 8-2 MG SL FILM
ORAL_FILM | SUBLINGUAL | 0 refills | Status: DC
  Filled 2022-07-31: qty 22, 11d supply, fill #0

## 2022-08-01 ENCOUNTER — Other Ambulatory Visit: Payer: Self-pay

## 2022-08-04 ENCOUNTER — Other Ambulatory Visit: Payer: Self-pay

## 2022-08-06 ENCOUNTER — Other Ambulatory Visit: Payer: Self-pay

## 2022-08-07 ENCOUNTER — Other Ambulatory Visit: Payer: Self-pay

## 2022-08-07 MED ORDER — BUTALBITAL-APAP-CAFFEINE 50-325-40 MG PO TABS
1.0000 | ORAL_TABLET | ORAL | 2 refills | Status: DC | PRN
  Filled 2022-08-07: qty 90, 15d supply, fill #0
  Filled 2022-09-04: qty 90, 15d supply, fill #1
  Filled 2022-10-27: qty 90, 15d supply, fill #2

## 2022-08-11 ENCOUNTER — Other Ambulatory Visit: Payer: Self-pay

## 2022-08-11 DIAGNOSIS — G894 Chronic pain syndrome: Secondary | ICD-10-CM | POA: Diagnosis not present

## 2022-08-11 DIAGNOSIS — F1190 Opioid use, unspecified, uncomplicated: Secondary | ICD-10-CM | POA: Diagnosis not present

## 2022-08-11 MED ORDER — BUPRENORPHINE HCL-NALOXONE HCL 8-2 MG SL FILM
1.0000 | ORAL_FILM | Freq: Two times a day (BID) | SUBLINGUAL | 1 refills | Status: DC
  Filled 2022-08-11: qty 60, 30d supply, fill #0
  Filled 2022-09-11: qty 60, 30d supply, fill #1

## 2022-08-13 ENCOUNTER — Other Ambulatory Visit: Payer: Self-pay

## 2022-08-14 ENCOUNTER — Other Ambulatory Visit: Payer: Self-pay

## 2022-08-27 ENCOUNTER — Other Ambulatory Visit: Payer: Self-pay

## 2022-08-27 MED ORDER — BUTALBITAL-APAP-CAFFEINE 50-325-40 MG PO TABS
1.0000 | ORAL_TABLET | Freq: Three times a day (TID) | ORAL | 1 refills | Status: DC | PRN
  Filled 2022-08-27 – 2022-09-29 (×2): qty 90, 30d supply, fill #0

## 2022-09-01 ENCOUNTER — Other Ambulatory Visit: Payer: Self-pay

## 2022-09-04 ENCOUNTER — Other Ambulatory Visit: Payer: Self-pay

## 2022-09-08 ENCOUNTER — Other Ambulatory Visit: Payer: Self-pay

## 2022-09-10 ENCOUNTER — Other Ambulatory Visit: Payer: Self-pay

## 2022-09-11 ENCOUNTER — Other Ambulatory Visit: Payer: Self-pay

## 2022-09-25 ENCOUNTER — Other Ambulatory Visit: Payer: Self-pay

## 2022-09-25 DIAGNOSIS — Z0000 Encounter for general adult medical examination without abnormal findings: Secondary | ICD-10-CM | POA: Diagnosis not present

## 2022-09-25 DIAGNOSIS — E118 Type 2 diabetes mellitus with unspecified complications: Secondary | ICD-10-CM | POA: Diagnosis not present

## 2022-09-25 DIAGNOSIS — Z1231 Encounter for screening mammogram for malignant neoplasm of breast: Secondary | ICD-10-CM | POA: Diagnosis not present

## 2022-09-25 DIAGNOSIS — N1831 Chronic kidney disease, stage 3a: Secondary | ICD-10-CM | POA: Diagnosis not present

## 2022-09-25 MED ORDER — ARIPIPRAZOLE 2 MG PO TABS
2.0000 mg | ORAL_TABLET | Freq: Every day | ORAL | 5 refills | Status: DC
  Filled 2023-06-22: qty 30, 30d supply, fill #0
  Filled 2023-07-16: qty 30, 30d supply, fill #1

## 2022-09-29 ENCOUNTER — Other Ambulatory Visit: Payer: Self-pay

## 2022-09-30 ENCOUNTER — Other Ambulatory Visit: Payer: Self-pay

## 2022-10-01 ENCOUNTER — Other Ambulatory Visit: Payer: Self-pay

## 2022-10-02 ENCOUNTER — Other Ambulatory Visit: Payer: Self-pay

## 2022-10-03 ENCOUNTER — Other Ambulatory Visit: Payer: Self-pay

## 2022-10-07 ENCOUNTER — Other Ambulatory Visit: Payer: Self-pay

## 2022-10-07 MED ORDER — ALPRAZOLAM 0.5 MG PO TABS
0.5000 mg | ORAL_TABLET | Freq: Two times a day (BID) | ORAL | 3 refills | Status: DC
  Filled 2022-10-10 (×2): qty 60, 30d supply, fill #0
  Filled 2022-11-07: qty 60, 30d supply, fill #1
  Filled 2022-12-05 (×2): qty 60, 30d supply, fill #2
  Filled 2023-01-02: qty 60, 30d supply, fill #3

## 2022-10-08 ENCOUNTER — Other Ambulatory Visit: Payer: Self-pay

## 2022-10-08 DIAGNOSIS — G894 Chronic pain syndrome: Secondary | ICD-10-CM | POA: Diagnosis not present

## 2022-10-08 DIAGNOSIS — Z79899 Other long term (current) drug therapy: Secondary | ICD-10-CM | POA: Diagnosis not present

## 2022-10-08 MED ORDER — BUPRENORPHINE HCL-NALOXONE HCL 8-2 MG SL FILM
1.0000 | ORAL_FILM | Freq: Two times a day (BID) | SUBLINGUAL | 1 refills | Status: DC
  Filled 2022-10-08 – 2022-10-09 (×2): qty 60, 30d supply, fill #0
  Filled 2022-11-07: qty 60, 30d supply, fill #1

## 2022-10-09 ENCOUNTER — Other Ambulatory Visit: Payer: Self-pay

## 2022-10-10 ENCOUNTER — Other Ambulatory Visit: Payer: Self-pay

## 2022-10-21 ENCOUNTER — Other Ambulatory Visit: Payer: Self-pay

## 2022-10-23 ENCOUNTER — Other Ambulatory Visit: Payer: Self-pay

## 2022-10-27 ENCOUNTER — Other Ambulatory Visit: Payer: Self-pay

## 2022-11-03 ENCOUNTER — Other Ambulatory Visit: Payer: Self-pay

## 2022-11-06 ENCOUNTER — Other Ambulatory Visit: Payer: Self-pay

## 2022-11-07 ENCOUNTER — Other Ambulatory Visit: Payer: Self-pay

## 2022-11-10 ENCOUNTER — Other Ambulatory Visit: Payer: Self-pay

## 2022-11-11 ENCOUNTER — Other Ambulatory Visit: Payer: Self-pay

## 2022-11-11 MED ORDER — LISINOPRIL 20 MG PO TABS
20.0000 mg | ORAL_TABLET | Freq: Every day | ORAL | 0 refills | Status: DC
  Filled 2022-11-11: qty 90, 90d supply, fill #0

## 2022-11-13 ENCOUNTER — Other Ambulatory Visit: Payer: Self-pay

## 2022-11-18 ENCOUNTER — Other Ambulatory Visit: Payer: Self-pay

## 2022-11-18 MED ORDER — ARIPIPRAZOLE 2 MG PO TABS
2.0000 mg | ORAL_TABLET | Freq: Every day | ORAL | 5 refills | Status: DC
  Filled 2022-11-18: qty 30, 30d supply, fill #0
  Filled 2022-12-05 – 2022-12-24 (×3): qty 30, 30d supply, fill #1
  Filled 2023-02-01: qty 30, 30d supply, fill #2
  Filled 2023-02-26: qty 30, 30d supply, fill #3
  Filled 2023-04-14: qty 30, 30d supply, fill #4
  Filled 2023-04-23 – 2023-05-25 (×2): qty 30, 30d supply, fill #5

## 2022-11-20 ENCOUNTER — Other Ambulatory Visit: Payer: Self-pay

## 2022-11-21 ENCOUNTER — Other Ambulatory Visit: Payer: Self-pay

## 2022-11-28 ENCOUNTER — Other Ambulatory Visit: Payer: Self-pay

## 2022-12-02 ENCOUNTER — Other Ambulatory Visit: Payer: Self-pay

## 2022-12-04 ENCOUNTER — Other Ambulatory Visit: Payer: Self-pay

## 2022-12-05 ENCOUNTER — Other Ambulatory Visit: Payer: Self-pay

## 2022-12-08 ENCOUNTER — Other Ambulatory Visit: Payer: Self-pay

## 2022-12-08 DIAGNOSIS — Z0000 Encounter for general adult medical examination without abnormal findings: Secondary | ICD-10-CM | POA: Diagnosis not present

## 2022-12-08 DIAGNOSIS — Z23 Encounter for immunization: Secondary | ICD-10-CM | POA: Diagnosis not present

## 2022-12-08 DIAGNOSIS — G43809 Other migraine, not intractable, without status migrainosus: Secondary | ICD-10-CM | POA: Diagnosis not present

## 2022-12-08 DIAGNOSIS — G894 Chronic pain syndrome: Secondary | ICD-10-CM | POA: Diagnosis not present

## 2022-12-08 DIAGNOSIS — Z79899 Other long term (current) drug therapy: Secondary | ICD-10-CM | POA: Diagnosis not present

## 2022-12-08 MED ORDER — BUPRENORPHINE HCL-NALOXONE HCL 8-2 MG SL FILM
1.0000 | ORAL_FILM | Freq: Two times a day (BID) | SUBLINGUAL | 1 refills | Status: DC
  Filled 2022-12-08: qty 60, 30d supply, fill #0
  Filled 2023-01-02 – 2023-01-05 (×2): qty 60, 30d supply, fill #1

## 2022-12-08 MED ORDER — BUTALBITAL-APAP-CAFFEINE 50-325-40 MG PO TABS
1.0000 | ORAL_TABLET | Freq: Three times a day (TID) | ORAL | 0 refills | Status: DC | PRN
  Filled 2022-12-08: qty 90, 30d supply, fill #0

## 2022-12-15 ENCOUNTER — Other Ambulatory Visit: Payer: Self-pay

## 2022-12-24 ENCOUNTER — Other Ambulatory Visit: Payer: Self-pay

## 2022-12-26 ENCOUNTER — Other Ambulatory Visit: Payer: Self-pay

## 2022-12-30 ENCOUNTER — Other Ambulatory Visit: Payer: Self-pay

## 2023-01-01 ENCOUNTER — Other Ambulatory Visit: Payer: Self-pay

## 2023-01-02 ENCOUNTER — Other Ambulatory Visit: Payer: Self-pay

## 2023-01-05 ENCOUNTER — Other Ambulatory Visit: Payer: Self-pay

## 2023-01-06 ENCOUNTER — Other Ambulatory Visit: Payer: Self-pay

## 2023-01-07 ENCOUNTER — Other Ambulatory Visit: Payer: Self-pay

## 2023-01-08 ENCOUNTER — Other Ambulatory Visit: Payer: Self-pay

## 2023-01-09 ENCOUNTER — Other Ambulatory Visit: Payer: Self-pay

## 2023-01-12 ENCOUNTER — Other Ambulatory Visit: Payer: Self-pay

## 2023-01-14 ENCOUNTER — Other Ambulatory Visit: Payer: Self-pay

## 2023-01-16 ENCOUNTER — Other Ambulatory Visit: Payer: Self-pay

## 2023-01-16 DIAGNOSIS — F4320 Adjustment disorder, unspecified: Secondary | ICD-10-CM | POA: Diagnosis not present

## 2023-01-16 DIAGNOSIS — I10 Essential (primary) hypertension: Secondary | ICD-10-CM | POA: Diagnosis not present

## 2023-01-16 DIAGNOSIS — G894 Chronic pain syndrome: Secondary | ICD-10-CM | POA: Diagnosis not present

## 2023-01-16 MED ORDER — FLUOXETINE HCL 20 MG PO CAPS
20.0000 mg | ORAL_CAPSULE | Freq: Every day | ORAL | 2 refills | Status: DC
  Filled 2023-01-16: qty 30, 30d supply, fill #0
  Filled 2023-02-26: qty 30, 30d supply, fill #1
  Filled 2023-03-25: qty 30, 30d supply, fill #2

## 2023-01-16 MED ORDER — CARVEDILOL 6.25 MG PO TABS
6.2500 mg | ORAL_TABLET | Freq: Two times a day (BID) | ORAL | 2 refills | Status: DC
  Filled 2023-01-16: qty 60, 30d supply, fill #0
  Filled 2023-07-16: qty 60, 30d supply, fill #1

## 2023-01-19 ENCOUNTER — Other Ambulatory Visit: Payer: Self-pay

## 2023-01-28 ENCOUNTER — Other Ambulatory Visit: Payer: Self-pay

## 2023-01-28 MED ORDER — BUPRENORPHINE HCL-NALOXONE HCL 8-2 MG SL FILM
1.0000 | ORAL_FILM | Freq: Two times a day (BID) | SUBLINGUAL | 0 refills | Status: DC
  Filled 2023-02-02: qty 60, 30d supply, fill #0

## 2023-02-02 ENCOUNTER — Other Ambulatory Visit: Payer: Self-pay

## 2023-02-12 ENCOUNTER — Other Ambulatory Visit: Payer: Self-pay

## 2023-02-12 ENCOUNTER — Other Ambulatory Visit (HOSPITAL_COMMUNITY): Payer: Self-pay

## 2023-02-12 DIAGNOSIS — F419 Anxiety disorder, unspecified: Secondary | ICD-10-CM | POA: Diagnosis not present

## 2023-02-12 DIAGNOSIS — Z79899 Other long term (current) drug therapy: Secondary | ICD-10-CM | POA: Diagnosis not present

## 2023-02-12 DIAGNOSIS — G43809 Other migraine, not intractable, without status migrainosus: Secondary | ICD-10-CM | POA: Diagnosis not present

## 2023-02-12 DIAGNOSIS — G894 Chronic pain syndrome: Secondary | ICD-10-CM | POA: Diagnosis not present

## 2023-02-12 DIAGNOSIS — F1190 Opioid use, unspecified, uncomplicated: Secondary | ICD-10-CM | POA: Diagnosis not present

## 2023-02-12 DIAGNOSIS — I10 Essential (primary) hypertension: Secondary | ICD-10-CM | POA: Diagnosis not present

## 2023-02-12 MED ORDER — BUPRENORPHINE HCL-NALOXONE HCL 8-2 MG SL FILM
1.0000 | ORAL_FILM | Freq: Two times a day (BID) | SUBLINGUAL | 1 refills | Status: DC
  Filled 2023-03-02 – 2023-03-03 (×3): qty 60, 30d supply, fill #0
  Filled 2023-04-01: qty 60, 30d supply, fill #1

## 2023-02-12 MED ORDER — BUTALBITAL-APAP-CAFFEINE 50-325-40 MG PO TABS
1.0000 | ORAL_TABLET | Freq: Three times a day (TID) | ORAL | 0 refills | Status: DC | PRN
  Filled 2023-02-12: qty 90, 30d supply, fill #0

## 2023-02-25 ENCOUNTER — Encounter: Payer: Self-pay | Admitting: Family Medicine

## 2023-02-25 ENCOUNTER — Other Ambulatory Visit: Payer: Self-pay

## 2023-02-25 ENCOUNTER — Ambulatory Visit: Payer: Commercial Managed Care - PPO | Admitting: Family Medicine

## 2023-02-25 VITALS — BP 171/80 | HR 98 | Temp 98.1°F | Resp 18 | Ht 64.0 in | Wt 164.0 lb

## 2023-02-25 DIAGNOSIS — G43709 Chronic migraine without aura, not intractable, without status migrainosus: Secondary | ICD-10-CM

## 2023-02-25 DIAGNOSIS — F419 Anxiety disorder, unspecified: Secondary | ICD-10-CM | POA: Diagnosis not present

## 2023-02-25 DIAGNOSIS — I2510 Atherosclerotic heart disease of native coronary artery without angina pectoris: Secondary | ICD-10-CM

## 2023-02-25 DIAGNOSIS — I10 Essential (primary) hypertension: Secondary | ICD-10-CM | POA: Diagnosis not present

## 2023-02-25 DIAGNOSIS — Z1231 Encounter for screening mammogram for malignant neoplasm of breast: Secondary | ICD-10-CM | POA: Diagnosis not present

## 2023-02-25 DIAGNOSIS — N1831 Chronic kidney disease, stage 3a: Secondary | ICD-10-CM

## 2023-02-25 DIAGNOSIS — Z1211 Encounter for screening for malignant neoplasm of colon: Secondary | ICD-10-CM

## 2023-02-25 DIAGNOSIS — Z131 Encounter for screening for diabetes mellitus: Secondary | ICD-10-CM | POA: Diagnosis not present

## 2023-02-25 DIAGNOSIS — M542 Cervicalgia: Secondary | ICD-10-CM | POA: Diagnosis not present

## 2023-02-25 DIAGNOSIS — Z23 Encounter for immunization: Secondary | ICD-10-CM | POA: Diagnosis not present

## 2023-02-25 MED ORDER — ALPRAZOLAM 0.5 MG PO TABS
0.5000 mg | ORAL_TABLET | Freq: Two times a day (BID) | ORAL | 3 refills | Status: DC
  Filled 2023-02-25: qty 60, 30d supply, fill #0
  Filled 2023-03-25: qty 60, 30d supply, fill #1
  Filled 2023-04-16 – 2023-04-22 (×7): qty 60, 30d supply, fill #2
  Filled 2023-05-20: qty 60, 30d supply, fill #3

## 2023-02-25 MED ORDER — BUTALBITAL-APAP-CAFFEINE 50-325-40 MG PO TABS
1.0000 | ORAL_TABLET | ORAL | 2 refills | Status: DC | PRN
  Filled 2023-02-25 – 2023-03-09 (×3): qty 90, 15d supply, fill #0
  Filled 2023-03-23: qty 90, 15d supply, fill #1
  Filled 2023-04-06: qty 90, 15d supply, fill #2
  Filled ????-??-??: fill #0

## 2023-02-25 NOTE — Assessment & Plan Note (Signed)
 She was a smoker.  Prevnar 20 today

## 2023-02-25 NOTE — Assessment & Plan Note (Signed)
 She had an MI in 2021 and had a stent placed.  Has not seen cardiology in years.  Is on atorvastatin 20 mg daily.  Will check CMP and lipids today
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
## 2023-02-25 NOTE — Assessment & Plan Note (Signed)
 Has been seen by Dr. Thedore Mins who reviewed her labs and ask her to come back in a year.  Still off of lisinopril and Lasix

## 2023-02-25 NOTE — Assessment & Plan Note (Signed)
 She has a history of elevated blood glucose.  Checking A1c and CMP today

## 2023-02-25 NOTE — Assessment & Plan Note (Signed)
 She takes Abilify 2 mg daily and Xanax 0.5 twice daily.  Feels like her anxiety is fairly well-controlled on this regimen.  PHQ-9 score 3 GAD-7 scored 7

## 2023-02-25 NOTE — Assessment & Plan Note (Signed)
 Was taking lisinopril and Lasix.  Developed AKI and was hospitalized.  Will start amlodipine 5 mg today.  Advise she may have some swelling in her feet.

## 2023-02-26 ENCOUNTER — Other Ambulatory Visit: Payer: Self-pay

## 2023-02-26 LAB — MICROALBUMIN / CREATININE URINE RATIO
Creatinine, Urine: 161.5 mg/dL
Microalb/Creat Ratio: 130 mg/g{creat} — ABNORMAL HIGH (ref 0–29)
Microalbumin, Urine: 209.5 ug/mL

## 2023-02-26 LAB — COMPREHENSIVE METABOLIC PANEL
ALT: 18 [IU]/L (ref 0–32)
AST: 17 [IU]/L (ref 0–40)
Albumin: 4.5 g/dL (ref 3.9–4.9)
Alkaline Phosphatase: 131 [IU]/L — ABNORMAL HIGH (ref 44–121)
BUN/Creatinine Ratio: 16 (ref 12–28)
BUN: 21 mg/dL (ref 8–27)
Bilirubin Total: 0.2 mg/dL (ref 0.0–1.2)
CO2: 15 mmol/L — ABNORMAL LOW (ref 20–29)
Calcium: 10 mg/dL (ref 8.7–10.3)
Chloride: 104 mmol/L (ref 96–106)
Creatinine, Ser: 1.33 mg/dL — ABNORMAL HIGH (ref 0.57–1.00)
Globulin, Total: 3.1 g/dL (ref 1.5–4.5)
Glucose: 251 mg/dL — ABNORMAL HIGH (ref 70–99)
Potassium: 4.5 mmol/L (ref 3.5–5.2)
Sodium: 137 mmol/L (ref 134–144)
Total Protein: 7.6 g/dL (ref 6.0–8.5)
eGFR: 45 mL/min/{1.73_m2} — ABNORMAL LOW (ref >59)

## 2023-02-26 LAB — LIPID PANEL
Chol/HDL Ratio: 8.8 {ratio} — ABNORMAL HIGH (ref 0.0–4.4)
Cholesterol, Total: 334 mg/dL — ABNORMAL HIGH (ref 100–199)
HDL: 38 mg/dL — ABNORMAL LOW (ref >39)
LDL Chol Calc (NIH): 192 mg/dL — ABNORMAL HIGH (ref 0–99)
Triglycerides: 500 mg/dL — ABNORMAL HIGH (ref 0–149)
VLDL Cholesterol Cal: 104 mg/dL — ABNORMAL HIGH (ref 5–40)

## 2023-02-26 LAB — HEMOGLOBIN A1C
Est. average glucose Bld gHb Est-mCnc: 220 mg/dL
Hgb A1c MFr Bld: 9.3 % — ABNORMAL HIGH (ref 4.8–5.6)

## 2023-02-27 ENCOUNTER — Other Ambulatory Visit: Payer: Self-pay

## 2023-03-02 ENCOUNTER — Ambulatory Visit (INDEPENDENT_AMBULATORY_CARE_PROVIDER_SITE_OTHER): Payer: Commercial Managed Care - PPO | Admitting: Family Medicine

## 2023-03-02 ENCOUNTER — Other Ambulatory Visit: Payer: Self-pay

## 2023-03-02 ENCOUNTER — Encounter: Payer: Self-pay | Admitting: Family Medicine

## 2023-03-02 VITALS — BP 122/82 | HR 97 | Temp 98.0°F | Resp 18 | Ht 64.0 in | Wt 165.0 lb

## 2023-03-02 DIAGNOSIS — R809 Proteinuria, unspecified: Secondary | ICD-10-CM | POA: Diagnosis not present

## 2023-03-02 DIAGNOSIS — E1129 Type 2 diabetes mellitus with other diabetic kidney complication: Secondary | ICD-10-CM

## 2023-03-02 DIAGNOSIS — E7800 Pure hypercholesterolemia, unspecified: Secondary | ICD-10-CM | POA: Diagnosis not present

## 2023-03-02 DIAGNOSIS — M1711 Unilateral primary osteoarthritis, right knee: Secondary | ICD-10-CM | POA: Insufficient documentation

## 2023-03-02 MED ORDER — DAPAGLIFLOZIN PROPANEDIOL 10 MG PO TABS
10.0000 mg | ORAL_TABLET | Freq: Every day | ORAL | 5 refills | Status: DC
  Filled 2023-03-03: qty 30, 30d supply, fill #0
  Filled 2023-05-28: qty 30, 30d supply, fill #1
  Filled 2023-07-16: qty 30, 30d supply, fill #2

## 2023-03-02 MED ORDER — DAPAGLIFLOZIN PROPANEDIOL 10 MG PO TABS
10.0000 mg | ORAL_TABLET | Freq: Every day | ORAL | 5 refills | Status: DC
  Filled 2023-03-02: qty 30, 30d supply, fill #0

## 2023-03-02 MED ORDER — LISINOPRIL 2.5 MG PO TABS
2.5000 mg | ORAL_TABLET | Freq: Every day | ORAL | 5 refills | Status: DC
  Filled 2023-03-02: qty 30, 30d supply, fill #0

## 2023-03-02 MED ORDER — GABAPENTIN 300 MG PO CAPS
300.0000 mg | ORAL_CAPSULE | Freq: Two times a day (BID) | ORAL | 5 refills | Status: AC
  Filled 2023-03-02: qty 60, 30d supply, fill #0
  Filled 2023-04-01: qty 60, 30d supply, fill #1
  Filled 2023-04-30: qty 60, 30d supply, fill #2

## 2023-03-02 NOTE — Progress Notes (Signed)
   Established Patient Office Visit  Subjective   Patient ID: Whitney Bernard, female    DOB: [birth year]  Age: 63 y.o. MRN: [id number]  Chief Complaint  Patient presents with   Medical Management of Chronic Issues    HPI She is here for follow-up of her labs.  Her hemoglobin A1c was 9.3 up from 7.4 a year ago.  Random blood sugar 251, total cholesterol 334, Triggs 500, LDL 192.  Creatinine 1.33, GFR 45     ROS    Objective:     BP 122/82 (BP Location: Left Arm, Patient Position: Sitting, Cuff Size: Normal)   Pulse 97   Temp 98 F (36.7 C) (Oral)   Resp 18   Ht 5' 4 (1.626 m)   Wt 165 lb (74.8 kg)   SpO2 97%   BMI 28.32 kg/m    Physical Exam Vitals reviewed.  Constitutional:      Appearance: Normal appearance.  HENT:     Head: Normocephalic.  Eyes:     General:        Right eye: No discharge.        Left eye: No discharge.  Cardiovascular:     Rate and Rhythm: Normal rate.  Pulmonary:     Effort: Pulmonary effort is normal.  Neurological:     Mental Status: She is alert and oriented to person, place, and time.  Psychiatric:        Mood and Affect: Mood normal.        Behavior: Behavior normal.        Thought Content: Thought content normal.        Judgment: Judgment normal.          No results found for any visits on 03/02/23.    The ASCVD Risk score (Arnett DK, et al., 2019) failed to calculate for the following reasons:   Risk score cannot be calculated because patient has a medical history suggesting prior/existing ASCVD    Assessment & Plan:  Type 2 diabetes mellitus with diabetic microalbuminuria, without long-term current use of insulin  (HCC) Assessment & Plan: A1c 9.3.  Up from 7.4 one year ago.  She is off metformin  secondary to her renal function.  Creatinine 1.33 EGFR 45.  Will try Farxiga  10 mg daily urine albumin creatinine ratio 130.  Will start lisinopril  2.5 mg daily.  Please see the eye doctor this year.  Discussed avoidance of  concentrated sweets and carbohydrates.  Discussed increasing exercise to a daily walk.  81 mg aspirin  daily.   Hypercholesterolemia Assessment & Plan: She is on atorvastatin  20 and LDL is 192, triglycerides 499, total cholesterol 334.  Expect she is going to need atorvastatin  40 but will wait and see how she improves with improvement in her diabetes.   Primary osteoarthritis of right knee Assessment & Plan: Both knees have effusion and are warm but the right seems worse than the left.  Has full range of motion just pain.  Advised to try Salonpas or Voltaren gel and will also try gabapentin  300 mg twice daily.   Other orders -     Dapagliflozin  Propanediol; Take 1 tablet (10 mg total) by mouth daily before breakfast.  Dispense: 30 tablet; Refill: 5 -     Lisinopril ; Take 1 tablet (2.5 mg total) by mouth daily.  Dispense: 30 tablet; Refill: 5     No follow-ups on file.    Trinda Harlacher K Marquon Alcala, MD

## 2023-03-02 NOTE — Assessment & Plan Note (Signed)
 Both knees have effusion and are warm but the right seems worse than the left.  Has full range of motion just pain.  Advised to try Salonpas or Voltaren gel and will also try gabapentin 300 mg twice daily.

## 2023-03-02 NOTE — Assessment & Plan Note (Signed)
 She is on atorvastatin 20 and LDL is 192, triglycerides 010, total cholesterol 334.  Expect she is going to need atorvastatin 40 but will wait and see how she improves with improvement in her diabetes.

## 2023-03-02 NOTE — Assessment & Plan Note (Addendum)
 A1c 9.3.  Up from 7.4 one year ago.  She is off metformin  secondary to her renal function.  Creatinine 1.33 EGFR 45.  Will try Farxiga  10 mg daily urine albumin creatinine ratio 130.  Will start lisinopril  2.5 mg daily.  Please see the eye doctor this year.  Discussed avoidance of concentrated sweets and carbohydrates.  Discussed increasing exercise to a daily walk.  81 mg aspirin  daily.

## 2023-03-03 ENCOUNTER — Other Ambulatory Visit: Payer: Self-pay

## 2023-03-06 ENCOUNTER — Other Ambulatory Visit: Payer: Self-pay

## 2023-03-09 ENCOUNTER — Other Ambulatory Visit: Payer: Self-pay

## 2023-03-16 ENCOUNTER — Other Ambulatory Visit: Payer: Self-pay

## 2023-03-23 ENCOUNTER — Other Ambulatory Visit: Payer: Self-pay

## 2023-03-23 ENCOUNTER — Other Ambulatory Visit (HOSPITAL_COMMUNITY): Payer: Self-pay

## 2023-03-24 ENCOUNTER — Other Ambulatory Visit: Payer: Self-pay

## 2023-03-25 ENCOUNTER — Other Ambulatory Visit: Payer: Self-pay

## 2023-04-01 ENCOUNTER — Other Ambulatory Visit: Payer: Self-pay

## 2023-04-02 ENCOUNTER — Other Ambulatory Visit: Payer: Self-pay

## 2023-04-06 ENCOUNTER — Other Ambulatory Visit: Payer: Self-pay

## 2023-04-07 ENCOUNTER — Other Ambulatory Visit: Payer: Self-pay

## 2023-04-13 ENCOUNTER — Other Ambulatory Visit: Payer: Self-pay

## 2023-04-13 ENCOUNTER — Emergency Department: Payer: Commercial Managed Care - PPO

## 2023-04-13 ENCOUNTER — Emergency Department
Admission: EM | Admit: 2023-04-13 | Discharge: 2023-04-13 | Disposition: A | Discharge status: Home / Self Care | Payer: Commercial Managed Care - PPO | Attending: Emergency Medicine | Admitting: Emergency Medicine

## 2023-04-13 DIAGNOSIS — E039 Hypothyroidism, unspecified: Secondary | ICD-10-CM | POA: Diagnosis not present

## 2023-04-13 DIAGNOSIS — N189 Chronic kidney disease, unspecified: Secondary | ICD-10-CM | POA: Insufficient documentation

## 2023-04-13 DIAGNOSIS — I129 Hypertensive chronic kidney disease with stage 1 through stage 4 chronic kidney disease, or unspecified chronic kidney disease: Secondary | ICD-10-CM | POA: Insufficient documentation

## 2023-04-13 DIAGNOSIS — R079 Chest pain, unspecified: Secondary | ICD-10-CM

## 2023-04-13 DIAGNOSIS — R1084 Generalized abdominal pain: Secondary | ICD-10-CM | POA: Diagnosis not present

## 2023-04-13 DIAGNOSIS — R0789 Other chest pain: Secondary | ICD-10-CM | POA: Diagnosis not present

## 2023-04-13 DIAGNOSIS — E1122 Type 2 diabetes mellitus with diabetic chronic kidney disease: Secondary | ICD-10-CM | POA: Insufficient documentation

## 2023-04-13 DIAGNOSIS — R072 Precordial pain: Secondary | ICD-10-CM | POA: Insufficient documentation

## 2023-04-13 DIAGNOSIS — R110 Nausea: Secondary | ICD-10-CM | POA: Insufficient documentation

## 2023-04-13 DIAGNOSIS — I10 Essential (primary) hypertension: Secondary | ICD-10-CM | POA: Diagnosis not present

## 2023-04-13 LAB — CBC
HCT: 37.8 % (ref 36.0–46.0)
Hemoglobin: 11.7 g/dL — ABNORMAL LOW (ref 12.0–15.0)
MCH: 26.7 pg (ref 26.0–34.0)
MCHC: 31 g/dL (ref 30.0–36.0)
MCV: 86.1 fL (ref 80.0–100.0)
Platelets: 409 10*3/uL — ABNORMAL HIGH (ref 150–400)
RBC: 4.39 MIL/uL (ref 3.87–5.11)
RDW: 15.3 % (ref 11.5–15.5)
WBC: 11.4 10*3/uL — ABNORMAL HIGH (ref 4.0–10.5)
nRBC: 0 % (ref 0.0–0.2)

## 2023-04-13 LAB — BASIC METABOLIC PANEL
Anion gap: 11 (ref 5–15)
BUN: 17 mg/dL (ref 8–23)
CO2: 20 mmol/L — ABNORMAL LOW (ref 22–32)
Calcium: 9.4 mg/dL (ref 8.9–10.3)
Chloride: 104 mmol/L (ref 98–111)
Creatinine, Ser: 1.44 mg/dL — ABNORMAL HIGH (ref 0.44–1.00)
GFR, Estimated: 41 mL/min — ABNORMAL LOW (ref >60)
Glucose, Bld: 212 mg/dL — ABNORMAL HIGH (ref 70–99)
Potassium: 4.7 mmol/L (ref 3.5–5.1)
Sodium: 135 mmol/L (ref 135–145)

## 2023-04-13 LAB — TROPONIN I (HIGH SENSITIVITY): Troponin I (High Sensitivity): 6 ng/L (ref <18)

## 2023-04-13 MED ORDER — KETOROLAC TROMETHAMINE 15 MG/ML IJ SOLN
15.0000 mg | Freq: Once | INTRAMUSCULAR | Status: AC
  Administered 2023-04-13: 15 mg via INTRAVENOUS
  Filled 2023-04-13: qty 1

## 2023-04-13 MED ORDER — ONDANSETRON HCL 4 MG/2ML IJ SOLN
4.0000 mg | Freq: Once | INTRAMUSCULAR | Status: AC
  Administered 2023-04-13: 4 mg via INTRAVENOUS
  Filled 2023-04-13: qty 2

## 2023-04-13 MED ORDER — ONDANSETRON 4 MG PO TBDP
4.0000 mg | ORAL_TABLET | Freq: Three times a day (TID) | ORAL | 0 refills | Status: AC | PRN
  Filled 2023-04-13: qty 20, 7d supply, fill #0

## 2023-04-13 MED ORDER — ALUM & MAG HYDROXIDE-SIMETH 200-200-20 MG/5ML PO SUSP
30.0000 mL | Freq: Once | ORAL | Status: AC
  Administered 2023-04-13: 30 mL via ORAL
  Filled 2023-04-13: qty 30

## 2023-04-13 NOTE — ED Notes (Addendum)
 Patient back from x-ray. Monitor applied.

## 2023-04-13 NOTE — ED Triage Notes (Addendum)
 Patient states left sided chest pain that started 1.5 hours ago while sitting on the couch. Chest pain 6/10 after 2 SL NTG.

## 2023-04-13 NOTE — ED Notes (Signed)
 Patient amb to bathroom. Back to bed without incident and monitor applied.

## 2023-04-13 NOTE — ED Notes (Signed)
 This RN to bedside to answer call bell. Placed back on monitors. Reports feeling shob. 100% on RA. RR even and unlabored. Primary RN notified.

## 2023-04-13 NOTE — ED Notes (Signed)
 ED Provider at bedside.

## 2023-04-13 NOTE — ED Provider Notes (Signed)
 [facility] Provider Note   Event Date/Time   First MD Initiated Contact with Patient 04/13/23 1515     (approximate) History  Chest Pain  HPI Whitney Bernard is a 63 y.o. female with a past medical history of hypertension, hypercholesterolemia, depression, hypothyroidism, chronic pain syndrome with narcotic abuse, CKD, type 2 diabetes who presents via EMS after experiencing lower substernal chest pain with associated epigastric pain and nausea.  Patient was given 2 sprays of nitroglycerin with EMS that did not improve her symptoms.  Patient has not had any vomiting.  Patient denies any recent travel or sick contacts. ROS: Patient currently denies any vision changes, tinnitus, difficulty speaking, facial droop, sore throat, shortness of breath, vomiting/diarrhea, dysuria, or weakness/numbness/paresthesias in any extremity   Physical Exam  Triage Vital Signs: ED Triage Vitals [04/13/23 1501]  Encounter Vitals Group     BP (!) 168/93     Systolic BP Percentile      Diastolic BP Percentile      Pulse Rate 85     Resp 16     Temp 97.8 F (36.6 C)     Temp Source Oral     SpO2 100 %     Weight 160 lb (72.6 kg)     Height 5\' 4"  (1.626 m)     Head Circumference      Peak Flow      Pain Score 6     Pain Loc      Pain Education      Exclude from Growth Chart    Most recent vital signs: Vitals:   04/13/23 1800 04/13/23 1900  BP: 139/71 (!) 153/72  Pulse: 80 71  Resp: 13 12  Temp:    SpO2: 100% 100%   General: Awake, oriented x4. CV:  Good peripheral perfusion.  No MGR Resp:  Normal effort.  Clear to auscultation bilaterally Abd:  No distention.  Other:  Elderly overweight Caucasian female resting comfortably in no acute distress ED Results / Procedures / Treatments  Labs (all labs ordered are listed, but only abnormal results are displayed) Labs Reviewed  BASIC METABOLIC PANEL - Abnormal; Notable for the following components:      Result Value   CO2  20 (*)    Glucose, Bld 212 (*)    Creatinine, Ser 1.44 (*)    GFR, Estimated 41 (*)    All other components within normal limits  CBC - Abnormal; Notable for the following components:   WBC 11.4 (*)    Hemoglobin 11.7 (*)    Platelets 409 (*)    All other components within normal limits  TROPONIN I (HIGH SENSITIVITY)   EKG ED ECG REPORT I, Merwyn Katos, the attending physician, personally viewed and interpreted this ECG. Date: 04/13/2023 EKG Time: 1504 Rate: 82 Rhythm: normal sinus rhythm QRS Axis: normal Intervals: normal ST/T Wave abnormalities: normal Narrative Interpretation: no evidence of acute ischemia RADIOLOGY ED MD interpretation: 2 view chest x-ray interpreted by me shows no evidence of acute abnormalities including no pneumonia, pneumothorax, or widened mediastinum.  Incidentally found stable enlarged cardiac silhouette -Agree with radiology assessment Official radiology report(s): DG Chest 2 View Result Date: 04/13/2023 CLINICAL DATA:  Chest pain for several hours, nausea, history of myocardial infarction EXAM: CHEST - 2 VIEW COMPARISON:  01/28/2022 FINDINGS: Frontal and lateral views of the chest demonstrate a stable enlarged cardiac silhouette. No acute airspace disease, effusion, or pneumothorax. Stable eventration of the right hemidiaphragm. No acute  bony abnormalities. IMPRESSION: 1. Stable enlarged cardiac silhouette. 2. No acute airspace disease. Electronically Signed   By: Sharlet Salina M.D.   On: 04/13/2023 16:34   PROCEDURES: Critical Care performed: No .1-3 Lead EKG Interpretation  Performed by: Merwyn Katos, MD Authorized by: Merwyn Katos, MD     Interpretation: normal     ECG rate:  71   ECG rate assessment: normal     Rhythm: sinus rhythm     Ectopy: none     Conduction: normal    MEDICATIONS ORDERED IN ED: Medications  alum & mag hydroxide-simeth (MAALOX/MYLANTA) 200-200-20 MG/5ML suspension 30 mL (30 mLs Oral Given 04/13/23 1542)   ondansetron (ZOFRAN) injection 4 mg (4 mg Intravenous Given 04/13/23 1543)  ketorolac (TORADOL) 15 MG/ML injection 15 mg (15 mg Intravenous Given 04/13/23 1744)   IMPRESSION / MDM / ASSESSMENT AND PLAN / ED COURSE  I reviewed the triage vital signs and the nursing notes.                             The patient is on the cardiac monitor to evaluate for evidence of arrhythmia and/or significant heart rate changes. Patient's presentation is most consistent with acute presentation with potential threat to life or bodily function. Workup: ECG, CXR, CBC, BMP, Troponin Findings: ECG: No overt evidence of STEMI. No evidence of Brugada's sign, delta wave, epsilon wave, significantly prolonged QTc, or malignant arrhythmia HS Troponin: Negative x1 Other Labs unremarkable for emergent problems. CXR: Without PTX, PNA, or widened mediastinum Last Stress Test:  2021 Last Heart Catheterization:  2024 HEART Score: 2021  Given History, Exam, and Workup I have low suspicion for ACS, Pneumothorax, Pneumonia, Pulmonary Embolus, Tamponade, Aortic Dissection or other emergent problem as a cause for this presentation.   Reassesment: Prior to discharge patient's pain was controlled and they were well appearing.  Disposition:  Discharge. Strict return precautions discussed with patient with full understanding. Advised patient to follow up promptly with primary care provider    FINAL CLINICAL IMPRESSION(S) / ED DIAGNOSES   Final diagnoses:  Chest pain, unspecified type  Nausea   Rx / DC Orders   ED Discharge Orders          Ordered    Ambulatory referral to Cardiology       Comments: If you have not heard from the Cardiology office within the next 72 hours please call [phone].   04/13/23 2054    ondansetron (ZOFRAN-ODT) 4 MG disintegrating tablet  Every 8 hours PRN        04/13/23 2054           Note:  This document was prepared using Dragon voice recognition software and may include  unintentional dictation errors.   Merwyn Katos, MD 04/13/23 2055

## 2023-04-14 ENCOUNTER — Other Ambulatory Visit: Payer: Self-pay

## 2023-04-14 ENCOUNTER — Other Ambulatory Visit (HOSPITAL_COMMUNITY): Payer: Self-pay

## 2023-04-16 ENCOUNTER — Other Ambulatory Visit: Payer: Self-pay

## 2023-04-17 ENCOUNTER — Other Ambulatory Visit: Payer: Self-pay

## 2023-04-17 DIAGNOSIS — G894 Chronic pain syndrome: Secondary | ICD-10-CM | POA: Diagnosis not present

## 2023-04-17 DIAGNOSIS — Z79899 Other long term (current) drug therapy: Secondary | ICD-10-CM | POA: Diagnosis not present

## 2023-04-17 MED ORDER — NALOXONE HCL 4 MG/0.1ML NA LIQD
NASAL | 1 refills | Status: AC
  Filled 2023-04-17: qty 2, 30d supply, fill #0

## 2023-04-20 ENCOUNTER — Other Ambulatory Visit: Payer: Self-pay

## 2023-04-21 ENCOUNTER — Other Ambulatory Visit: Payer: Self-pay

## 2023-04-21 MED ORDER — BUPRENORPHINE HCL-NALOXONE HCL 8-2 MG SL FILM
ORAL_FILM | Freq: Two times a day (BID) | SUBLINGUAL | 1 refills | Status: DC
  Filled 2023-04-30: qty 60, 30d supply, fill #0
  Filled 2023-05-28: qty 60, 30d supply, fill #1

## 2023-04-22 ENCOUNTER — Other Ambulatory Visit: Payer: Self-pay

## 2023-04-23 ENCOUNTER — Other Ambulatory Visit: Payer: Self-pay

## 2023-04-27 ENCOUNTER — Other Ambulatory Visit: Payer: Self-pay | Admitting: Family Medicine

## 2023-04-27 ENCOUNTER — Other Ambulatory Visit: Payer: Self-pay

## 2023-04-28 ENCOUNTER — Other Ambulatory Visit: Payer: Self-pay

## 2023-04-29 ENCOUNTER — Other Ambulatory Visit: Payer: Self-pay | Admitting: Family Medicine

## 2023-04-29 ENCOUNTER — Other Ambulatory Visit: Payer: Self-pay

## 2023-04-29 NOTE — Telephone Encounter (Unsigned)
 Copied from CRM [id number]. Topic: Clinical - Medication Refill >> Apr 29, 2023  2:04 PM Martha Clan wrote: Most Recent Primary Care Visit:  Provider: Alease Medina  Department: [facility]  Visit Type: OFFICE VISIT  Date: 03/02/2023  Medication: butalbital-acetaminophen-caffeine (FIORICET) 50-325-40 MG tablet [[id number]] Rx #: [id number]  FLUoxetine (PROZAC) 20 MG capsule [[id number]]   Has the patient contacted their pharmacy? Yes (Agent: If no, request that the patient contact the pharmacy for the refill. If patient does not wish to contact the pharmacy document the reason why and proceed with request.) (Agent: If yes, when and what did the pharmacy advise?)  Is this the correct pharmacy for this prescription? Yes If no, delete pharmacy and type the correct one.  This is the patient's preferred pharmacy:  [facility] [address] [place] [address] Phone: [phone] Fax: [phone]   Has the prescription been filled recently? No  Is the patient out of the medication? Yes  Has the patient been seen for an appointment in the last year OR does the patient have an upcoming appointment? Yes  Can we respond through MyChart? No  Agent: Please be advised that Rx refills may take up to 3 business days. We ask that you follow-up with your pharmacy.

## 2023-04-30 ENCOUNTER — Other Ambulatory Visit: Payer: Self-pay

## 2023-05-01 ENCOUNTER — Other Ambulatory Visit (HOSPITAL_BASED_OUTPATIENT_CLINIC_OR_DEPARTMENT_OTHER): Payer: Self-pay

## 2023-05-01 ENCOUNTER — Other Ambulatory Visit: Payer: Self-pay

## 2023-05-01 ENCOUNTER — Other Ambulatory Visit (HOSPITAL_COMMUNITY): Payer: Self-pay

## 2023-05-01 ENCOUNTER — Other Ambulatory Visit: Payer: Self-pay | Admitting: Family Medicine

## 2023-05-01 ENCOUNTER — Telehealth: Payer: Self-pay | Admitting: Family Medicine

## 2023-05-01 DIAGNOSIS — F3340 Major depressive disorder, recurrent, in remission, unspecified: Secondary | ICD-10-CM

## 2023-05-01 DIAGNOSIS — G43709 Chronic migraine without aura, not intractable, without status migrainosus: Secondary | ICD-10-CM

## 2023-05-01 MED ORDER — FLUOXETINE HCL 20 MG PO CAPS
20.0000 mg | ORAL_CAPSULE | Freq: Every morning | ORAL | 1 refills | Status: DC
  Filled 2023-05-01 (×2): qty 90, 90d supply, fill #0

## 2023-05-01 MED ORDER — BUTALBITAL-APAP-CAFFEINE 50-325-40 MG PO TABS
1.0000 | ORAL_TABLET | ORAL | 2 refills | Status: DC | PRN
  Filled 2023-05-01 (×2): qty 90, 15d supply, fill #0
  Filled 2023-05-14: qty 90, 15d supply, fill #1
  Filled 2023-05-25 – 2023-05-26 (×2): qty 90, 15d supply, fill #2

## 2023-05-01 NOTE — Telephone Encounter (Signed)
 Copied from CRM [id number]. Topic: General - Other >> Apr 30, 2023 10:09 AM Whitney Bernard wrote: Reason for CRM: Pls call patient [phone] checking status of refill from 3 days ago butalbital-acetaminophen-caffeine (BAC) 50-325-40 MG tablet FLUoxetine (PROZAC) 20 MG cap

## 2023-05-01 NOTE — Telephone Encounter (Signed)
 Copied from CRM [id number]. Topic: Clinical - Prescription Issue >> May 01, 2023  9:17 AM Marland Kitchen D wrote: Patient still haven't gotten her prescriptions the pharmacy said they haven't received it from the doctor

## 2023-05-12 ENCOUNTER — Other Ambulatory Visit: Payer: Self-pay

## 2023-05-14 ENCOUNTER — Other Ambulatory Visit: Payer: Self-pay

## 2023-05-14 ENCOUNTER — Other Ambulatory Visit (HOSPITAL_COMMUNITY): Payer: Self-pay

## 2023-05-18 ENCOUNTER — Other Ambulatory Visit: Payer: Self-pay

## 2023-05-18 ENCOUNTER — Emergency Department
Admission: EM | Admit: 2023-05-18 | Discharge: 2023-05-18 | Disposition: A | Discharge status: Home / Self Care | Attending: Emergency Medicine | Admitting: Emergency Medicine

## 2023-05-18 ENCOUNTER — Emergency Department

## 2023-05-18 DIAGNOSIS — R110 Nausea: Secondary | ICD-10-CM | POA: Insufficient documentation

## 2023-05-18 DIAGNOSIS — R079 Chest pain, unspecified: Secondary | ICD-10-CM | POA: Diagnosis not present

## 2023-05-18 DIAGNOSIS — R0789 Other chest pain: Secondary | ICD-10-CM | POA: Diagnosis not present

## 2023-05-18 DIAGNOSIS — N189 Chronic kidney disease, unspecified: Secondary | ICD-10-CM | POA: Diagnosis not present

## 2023-05-18 DIAGNOSIS — I10 Essential (primary) hypertension: Secondary | ICD-10-CM | POA: Diagnosis not present

## 2023-05-18 DIAGNOSIS — I2510 Atherosclerotic heart disease of native coronary artery without angina pectoris: Secondary | ICD-10-CM | POA: Insufficient documentation

## 2023-05-18 DIAGNOSIS — R1030 Lower abdominal pain, unspecified: Secondary | ICD-10-CM | POA: Diagnosis not present

## 2023-05-18 DIAGNOSIS — R112 Nausea with vomiting, unspecified: Secondary | ICD-10-CM | POA: Diagnosis not present

## 2023-05-18 LAB — CBC
HCT: 38.1 % (ref 36.0–46.0)
Hemoglobin: 11.5 g/dL — ABNORMAL LOW (ref 12.0–15.0)
MCH: 26.2 pg (ref 26.0–34.0)
MCHC: 30.2 g/dL (ref 30.0–36.0)
MCV: 86.8 fL (ref 80.0–100.0)
Platelets: 396 10*3/uL (ref 150–400)
RBC: 4.39 MIL/uL (ref 3.87–5.11)
RDW: 14.5 % (ref 11.5–15.5)
WBC: 9.7 10*3/uL (ref 4.0–10.5)
nRBC: 0 % (ref 0.0–0.2)

## 2023-05-18 LAB — BASIC METABOLIC PANEL WITH GFR
Anion gap: 10 (ref 5–15)
BUN: 14 mg/dL (ref 8–23)
CO2: 17 mmol/L — ABNORMAL LOW (ref 22–32)
Calcium: 9.5 mg/dL (ref 8.9–10.3)
Chloride: 109 mmol/L (ref 98–111)
Creatinine, Ser: 1.23 mg/dL — ABNORMAL HIGH (ref 0.44–1.00)
GFR, Estimated: 49 mL/min — ABNORMAL LOW (ref >60)
Glucose, Bld: 169 mg/dL — ABNORMAL HIGH (ref 70–99)
Potassium: 3.7 mmol/L (ref 3.5–5.1)
Sodium: 136 mmol/L (ref 135–145)

## 2023-05-18 LAB — TROPONIN I (HIGH SENSITIVITY): Troponin I (High Sensitivity): 6 ng/L (ref <18)

## 2023-05-18 NOTE — ED Provider Notes (Signed)
 [facility] Provider Note   Event Date/Time   First MD Initiated Contact with Patient 05/18/23 [phone]     (approximate) History  Chest Pain  HPI SHACOLA SCHUSSLER is a 63 y.o. female with a past medical history of CAD, CKD, narcotic abuse, chronic pain disorder, and currently on a pain contract with Suboxone who presents via EMS after complaining of chest pain that started approximately 6 hours prior to arrival and described as sudden, 6/10 pressure in the central chest that is nonradiating and associated with nausea. ROS: Patient currently denies any vision changes, tinnitus, difficulty speaking, facial droop, sore throat, shortness of breath, abdominal pain, vomiting/diarrhea, dysuria, or weakness/numbness/paresthesias in any extremity   Physical Exam  Triage Vital Signs: ED Triage Vitals  Encounter Vitals Group     BP 05/18/23 1455 (!) 173/88     Systolic BP Percentile --      Diastolic BP Percentile --      Pulse Rate 05/18/23 1455 85     Resp 05/18/23 1455 18     Temp 05/18/23 1455 98 F (36.7 C)     Temp src --      SpO2 05/18/23 1455 100 %     Weight 05/18/23 1618 160 lb 0.9 oz (72.6 kg)     Height 05/18/23 1618 5\' 4"  (1.626 m)     Head Circumference --      Peak Flow --      Pain Score 05/18/23 1455 4     Pain Loc --      Pain Education --      Exclude from Growth Chart --    Most recent vital signs: Vitals:   05/18/23 1455  BP: (!) 173/88  Pulse: 85  Resp: 18  Temp: 98 F (36.7 C)  SpO2: 100%   General: Awake, oriented x4. CV:  Good peripheral perfusion.  Resp:  Normal effort.  Abd:  No distention.  Other:  Elderly overweight Caucasian female resting comfortably in no acute distress ED Results / Procedures / Treatments  Labs (all labs ordered are listed, but only abnormal results are displayed) Labs Reviewed  BASIC METABOLIC PANEL WITH GFR - Abnormal; Notable for the following components:      Result Value   CO2 17 (*)    Glucose, Bld  169 (*)    Creatinine, Ser 1.23 (*)    GFR, Estimated 49 (*)    All other components within normal limits  CBC - Abnormal; Notable for the following components:   Hemoglobin 11.5 (*)    All other components within normal limits  TROPONIN I (HIGH SENSITIVITY)  TROPONIN I (HIGH SENSITIVITY)   EKG ED ECG REPORT I, Merwyn Katos, the attending physician, personally viewed and interpreted this ECG. Date: 05/18/2023 EKG Time: 1452 Rate: 82 Rhythm: normal sinus rhythm QRS Axis: normal Intervals: normal ST/T Wave abnormalities: normal Narrative Interpretation: no evidence of acute ischemia RADIOLOGY ED MD interpretation: 2 view chest x-ray interpreted by me shows no evidence of acute abnormalities including no pneumonia, pneumothorax, or widened mediastinum -Agree with radiology assessment Official radiology report(s): DG Chest 2 View Result Date: 05/18/2023 CLINICAL DATA:  Chest pain. EXAM: CHEST - 2 VIEW COMPARISON:  Chest radiograph dated 04/13/2023. FINDINGS: No focal consolidation, pleural effusion, or pneumothorax. Stable cardiac silhouette. No acute osseous pathology. IMPRESSION: No active cardiopulmonary disease. Electronically Signed   By: Elgie Collard M.D.   On: 05/18/2023 15:53   PROCEDURES: Critical Care performed: No Procedures MEDICATIONS  ORDERED IN ED: Medications - No data to display IMPRESSION / MDM / ASSESSMENT AND PLAN / ED COURSE  I reviewed the triage vital signs and the nursing notes.                             The patient is on the cardiac monitor to evaluate for evidence of arrhythmia and/or significant heart rate changes. Patient's presentation is most consistent with acute presentation with potential threat to life or bodily function. Patient is a 63 year old female with the above-stated past medical history who presents for sudden onset substernal chest pain that began approximately 6 hours prior to arrival Workup: ECG, CXR, CBC, BMP,  Troponin Findings: ECG: No overt evidence of STEMI. No evidence of Brugada's sign, delta wave, epsilon wave, significantly prolonged QTc, or malignant arrhythmia HS Troponin: Negative x1 Other Labs unremarkable for emergent problems. CXR: Without PTX, PNA, or widened mediastinum Last Stress Test:  2021 Last Heart Catheterization:  2024 HEART Score: 4  Given History, Exam, and Workup I have low suspicion for ACS, Pneumothorax, Pneumonia, Pulmonary Embolus, Tamponade, Aortic Dissection or other emergent problem as a cause for this presentation.   Reassesment: Prior to discharge patient's pain was controlled and they were well appearing. Patient encouraged to take her Suboxone that she is prescribed Disposition:  Discharge. Strict return precautions discussed with patient with full understanding. Advised patient to follow up promptly with primary care provider    FINAL CLINICAL IMPRESSION(S) / ED DIAGNOSES   Final diagnoses:  Chest pain, unspecified type  Nausea  Coronary artery disease involving native coronary artery of native heart without angina pectoris   Rx / DC Orders   ED Discharge Orders          Ordered    Ambulatory referral to Cardiology       Comments: If you have not heard from the Cardiology office within the next 72 hours please call [phone].   05/18/23 1644           Note:  This document was prepared using Dragon voice recognition software and may include unintentional dictation errors.   Merwyn Katos, MD 05/18/23 [phone]

## 2023-05-18 NOTE — ED Triage Notes (Signed)
 Pt comes via EMS from home with c/o cp. Pt states this started about 6 hrs ago. Pt states was sudden 6/10 pressure that is nonraidating. Pt states nausea.  EMS gave 4 zofra, 324 aspirin and 1 nitroglycerin and pain down to 4/10  Pt has hx of MI and stent pt also states left/right belly pain that is tender to touch.

## 2023-05-18 NOTE — Discharge Instructions (Addendum)
 Please use her home Suboxone for any continued pain. You may also use ibuprofen (Motrin) up to 800 mg every 8 hours, naproxen (Naprosyn) up to 500 mg every 12 hours, and/or acetaminophen (Tylenol) up to 4 g/day for any continued pain.  Please do not use this medication regimen for longer than 7 days

## 2023-05-18 NOTE — ED Notes (Signed)
 See triage note  Presents with chest and abd pain  States pain started this am  States pain was sudden onset   States pain was non radiating

## 2023-05-19 ENCOUNTER — Other Ambulatory Visit: Payer: Self-pay

## 2023-05-20 ENCOUNTER — Other Ambulatory Visit: Payer: Self-pay

## 2023-05-22 ENCOUNTER — Other Ambulatory Visit: Payer: Self-pay

## 2023-05-25 ENCOUNTER — Other Ambulatory Visit: Payer: Self-pay

## 2023-05-26 ENCOUNTER — Other Ambulatory Visit: Payer: Self-pay

## 2023-05-28 ENCOUNTER — Other Ambulatory Visit: Payer: Self-pay

## 2023-06-02 ENCOUNTER — Telehealth: Payer: Self-pay | Admitting: Family Medicine

## 2023-06-02 ENCOUNTER — Telehealth: Payer: Self-pay

## 2023-06-02 NOTE — Telephone Encounter (Signed)
 Was unable to reach patient via phone nor leave a message. Called patient's emergency contact (spouse) and requested that patient return call.

## 2023-06-02 NOTE — Telephone Encounter (Signed)
 Patient returned call to CMA .  Transferred call.

## 2023-06-02 NOTE — Telephone Encounter (Signed)
 Patient states that she will submit stool sample to Cologuard.

## 2023-06-02 NOTE — Telephone Encounter (Signed)
-----   Message from Donette Furlong sent at 06/02/2023 11:36 AM EDT ----- Please ask patient to do the Cologuard sample.  Thanks ----- Message ----- From: SYSTEM Sent: 05/26/2023  12:14 AM EDT To: Susan K Ziglar, MD

## 2023-06-09 ENCOUNTER — Other Ambulatory Visit: Payer: Self-pay

## 2023-06-09 ENCOUNTER — Encounter: Payer: Self-pay | Admitting: Family Medicine

## 2023-06-09 ENCOUNTER — Ambulatory Visit (INDEPENDENT_AMBULATORY_CARE_PROVIDER_SITE_OTHER): Admitting: Family Medicine

## 2023-06-09 VITALS — BP 180/123 | HR 83 | Temp 98.6°F | Resp 18 | Ht 64.0 in | Wt 162.0 lb

## 2023-06-09 DIAGNOSIS — F321 Major depressive disorder, single episode, moderate: Secondary | ICD-10-CM

## 2023-06-09 DIAGNOSIS — R809 Proteinuria, unspecified: Secondary | ICD-10-CM

## 2023-06-09 DIAGNOSIS — E1129 Type 2 diabetes mellitus with other diabetic kidney complication: Secondary | ICD-10-CM

## 2023-06-09 DIAGNOSIS — G43709 Chronic migraine without aura, not intractable, without status migrainosus: Secondary | ICD-10-CM | POA: Insufficient documentation

## 2023-06-09 DIAGNOSIS — E782 Mixed hyperlipidemia: Secondary | ICD-10-CM | POA: Insufficient documentation

## 2023-06-09 DIAGNOSIS — F418 Other specified anxiety disorders: Secondary | ICD-10-CM

## 2023-06-09 LAB — POCT GLYCOSYLATED HEMOGLOBIN (HGB A1C): Hemoglobin A1C: 8.1 % — AB (ref 4.0–5.6)

## 2023-06-09 MED ORDER — BUTALBITAL-APAP-CAFFEINE 50-325-40 MG PO TABS
1.0000 | ORAL_TABLET | ORAL | 2 refills | Status: DC | PRN
  Filled 2023-06-09: qty 90, 15d supply, fill #0
  Filled 2023-06-23: qty 90, 15d supply, fill #1
  Filled 2023-07-03 – 2023-07-06 (×2): qty 90, 15d supply, fill #2

## 2023-06-09 MED ORDER — METFORMIN HCL 500 MG PO TABS
500.0000 mg | ORAL_TABLET | Freq: Two times a day (BID) | ORAL | 3 refills | Status: DC
Start: 2023-06-09 — End: 2023-07-09
  Filled 2023-06-09: qty 180, 90d supply, fill #0

## 2023-06-09 MED ORDER — ATORVASTATIN CALCIUM 20 MG PO TABS
20.0000 mg | ORAL_TABLET | Freq: Every day | ORAL | 3 refills | Status: DC
  Filled 2023-06-09: qty 30, 30d supply, fill #0

## 2023-06-09 MED ORDER — FLUOXETINE HCL 40 MG PO CAPS
40.0000 mg | ORAL_CAPSULE | Freq: Every day | ORAL | 3 refills | Status: DC
  Filled 2023-06-09: qty 90, 90d supply, fill #0

## 2023-06-09 NOTE — Assessment & Plan Note (Signed)
 Was on metformin  but developed CKD and her metformin  was stopped.  Only taking Farxiga  for her DMT2 and her A1c is up to 8.3%  Checking renal function today. 04/2023 Creatinine 1.23,  Will try low dose metformin , 500mg  daily and keep close watch on renal function.

## 2023-06-09 NOTE — Assessment & Plan Note (Signed)
 She is currently on Abilify  2 mg daily and fluoxetine  20 mg daily.  Her motivation is down and her mood is low.  Will increase fluoxetine  to 40 mg daily and follow-up in a month.

## 2023-06-09 NOTE — Progress Notes (Signed)
 Established Patient Office Visit  Subjective   Patient ID: Whitney Bernard, female    DOB: 1960/06/04  Age: 63 y.o. 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  Both work-ups were benign.  She has a FOLLOW-UP appointment with Cardiology in May.  She does not take anything for GERD.   A1c today is 8.1%.  She is on Farxiga  10 mg daily.  She was on metformin  but this was stopped when she developed CKD.    Creatinine baseline 1.23.   She feels more sad and has less motivation.  Her mood has been down.  She is on fluoxetine  20 mg daily and Abilify  2mg  daily. She feels that the Abilify  greatly helped with her anxiety.  She denies SI and HI.  She would be interested in increasing her medication.  She is not seeing psychiatry.   Blood pressure is 188/113 recheck 180/123.  She admits she did not take her blood pressure medicine this morning.  She is on lisinopril  2.5 mg  and Coreg  6.25mg  twice daily. Her pain management provider, Dr. Zannie Hey, would like her to taper off of xanax .  She has a prescription for 0.5mg  BID.[name]  She does not take xanax  everyday. She has chronic, daily Has.  She controls her Has with butalbital  that she takes TID everyday.   She quit smoking about 3 years ago.      ROS    Objective:     BP (!) 180/123 (BP Location: Right Arm, Patient Position: Sitting, Cuff Size: Normal)   Pulse 83   Temp 98.6 F (37 C) (Oral)   Resp 18   Ht 5\' 4"  (1.626 m)   Wt 162 lb (73.5 kg)   SpO2 95%   BMI 27.81 kg/m    Physical Exam Vitals and nursing note reviewed.  Constitutional:      Appearance: Normal appearance.  HENT:     Head: Normocephalic and atraumatic.   Eyes:     Conjunctiva/sclera: Conjunctivae normal.  Cardiovascular:     Rate and Rhythm: Normal rate and regular rhythm.     Pulses:          Dorsalis pedis pulses are 2+ on the right side and 2+ on the left side.       Posterior tibial pulses are 2+ on the right side and 2+ on the left side.  Pulmonary:     Effort: Pulmonary effort is normal.     Breath sounds: Normal breath sounds.  Musculoskeletal:     Right lower leg: No edema.     Left lower leg: No edema.  Skin:    General: Skin is warm and dry.  Neurological:     Mental Status: She is alert and oriented to person, place, and time.  Psychiatric:        Mood and Affect: Mood normal.        Behavior: Behavior normal.        Thought Content: Thought content normal.        Judgment: Judgment normal.       Diabetic foot exam was performed with the following findings:   No data filed      Results  for orders placed or performed in visit on 06/09/23  POCT glycosylated hemoglobin (Hb A1C)  Result Value Ref Range   Hemoglobin A1C 8.1 (A) 4.0 - 5.6 %   HbA1c POC (<> result, manual entry)     HbA1c, POC (prediabetic range)     HbA1c, POC (controlled diabetic range)        The ASCVD Risk score (Arnett DK, et al., 2019) failed to calculate for the following reasons:   Risk score cannot be calculated because patient has a medical history suggesting prior/existing ASCVD    Assessment & Plan:  Type 2 diabetes mellitus with diabetic microalbuminuria, without long-term current use of insulin  (HCC) Assessment & Plan: Was on metformin  but developed CKD and her metformin  was stopped.  Only taking Farxiga  for her DMT2 and her A1c is up to 8.3%  Checking renal function today. 04/2023 Creatinine 1.23,  Will try low dose metformin , 500mg  daily and keep close watch on renal function.    Orders: -     POCT glycosylated hemoglobin (Hb A1C) -     Comprehensive metabolic panel with GFR -     Microalbumin / creatinine urine ratio -      metFORMIN  HCl; Take 1 tablet (500 mg total) by mouth 2 (two) times daily with a meal.  Dispense: 180 tablet; Refill: 3  Chronic migraine without aura without status migrainosus, not intractable -     Butalbital -APAP-Caffeine ; Take 1 tablet by mouth every 4 (four) hours as needed for headache.  Dispense: 90 tablet; Refill: 2  Mixed hyperlipidemia Assessment & Plan: She is on atorvastatin  20 mg daily.  The goal for her is LDL less than 70.  Orders: -     Atorvastatin  Calcium ; Take 1 tablet (20 mg total) by mouth daily.  Dispense: 30 tablet; Refill: 3 -     Lipid panel  Depression, major, single episode, moderate (HCC) -     FLUoxetine  HCl; Take 1 capsule (40 mg total) by mouth daily.  Dispense: 90 capsule; Refill: 3  Chronic migraine w/o aura w/o status migrainosus, not intractable Assessment & Plan: She is taking butalbital  3 times daily as a standing medication and this is how she controls her headaches.  Will need this to become a as needed medication.  Plan to work on this in the future with her   Depression with anxiety Assessment & Plan: She is currently on Abilify  2 mg daily and fluoxetine  20 mg daily.  Her motivation is down and her mood is low.  Will increase fluoxetine  to 40 mg daily and follow-up in a month.      Return in about 4 weeks (around 07/07/2023) for BP recheck, mood disorder.    Porshea Janowski K Desmon Hitchner, MD

## 2023-06-09 NOTE — Assessment & Plan Note (Signed)
 She is on atorvastatin  20 mg daily.  The goal for her is LDL less than 70.

## 2023-06-09 NOTE — Assessment & Plan Note (Signed)
 She is taking butalbital  3 times daily as a standing medication and this is how she controls her headaches.  Will need this to become a as needed medication.  Plan to work on this in the future with her

## 2023-06-10 ENCOUNTER — Telehealth: Payer: Self-pay | Admitting: Family Medicine

## 2023-06-10 ENCOUNTER — Other Ambulatory Visit: Payer: Self-pay | Admitting: Family Medicine

## 2023-06-10 ENCOUNTER — Other Ambulatory Visit: Payer: Self-pay

## 2023-06-10 DIAGNOSIS — I10 Essential (primary) hypertension: Secondary | ICD-10-CM

## 2023-06-10 DIAGNOSIS — E7800 Pure hypercholesterolemia, unspecified: Secondary | ICD-10-CM

## 2023-06-10 LAB — MICROALBUMIN / CREATININE URINE RATIO
Creatinine, Urine: 86.4 mg/dL
Microalb/Creat Ratio: 425 mg/g{creat} — ABNORMAL HIGH (ref 0–29)
Microalbumin, Urine: 367.2 ug/mL

## 2023-06-10 LAB — COMPREHENSIVE METABOLIC PANEL WITH GFR
ALT: 17 IU/L (ref 0–32)
AST: 12 IU/L (ref 0–40)
Albumin: 4.5 g/dL (ref 3.9–4.9)
Alkaline Phosphatase: 168 IU/L — ABNORMAL HIGH (ref 44–121)
BUN/Creatinine Ratio: 14 (ref 12–28)
BUN: 20 mg/dL (ref 8–27)
Bilirubin Total: <0.2 mg/dL (ref 0.0–1.2)
CO2: 17 mmol/L — ABNORMAL LOW (ref 20–29)
Calcium: 10.2 mg/dL (ref 8.7–10.3)
Chloride: 105 mmol/L (ref 96–106)
Creatinine, Ser: 1.42 mg/dL — ABNORMAL HIGH (ref 0.57–1.00)
Globulin, Total: 3.2 g/dL (ref 1.5–4.5)
Glucose: 162 mg/dL — ABNORMAL HIGH (ref 70–99)
Potassium: 4.7 mmol/L (ref 3.5–5.2)
Sodium: 136 mmol/L (ref 134–144)
Total Protein: 7.7 g/dL (ref 6.0–8.5)
eGFR: 41 mL/min/{1.73_m2} — ABNORMAL LOW (ref >59)

## 2023-06-10 LAB — LIPID PANEL
Chol/HDL Ratio: 6.2 ratio — ABNORMAL HIGH (ref 0.0–4.4)
Cholesterol, Total: 286 mg/dL — ABNORMAL HIGH (ref 100–199)
HDL: 46 mg/dL (ref >39)
LDL Chol Calc (NIH): 200 mg/dL — ABNORMAL HIGH (ref 0–99)
Triglycerides: 206 mg/dL — ABNORMAL HIGH (ref 0–149)
VLDL Cholesterol Cal: 40 mg/dL (ref 5–40)

## 2023-06-10 MED ORDER — ATORVASTATIN CALCIUM 40 MG PO TABS
40.0000 mg | ORAL_TABLET | Freq: Every day | ORAL | 3 refills | Status: AC
  Filled 2023-06-10: qty 90, 90d supply, fill #0

## 2023-06-10 MED ORDER — AMLODIPINE BESYLATE 10 MG PO TABS
10.0000 mg | ORAL_TABLET | Freq: Every day | ORAL | 11 refills | Status: AC
  Filled 2023-06-10: qty 30, 30d supply, fill #0
  Filled 2023-07-28: qty 30, 30d supply, fill #1
  Filled 2023-09-18: qty 30, 30d supply, fill #2
  Filled 2023-11-04: qty 30, 30d supply, fill #3
  Filled 2023-12-18: qty 30, 30d supply, fill #4
  Filled 2024-03-15: qty 90, 90d supply, fill #5

## 2023-06-10 NOTE — Telephone Encounter (Signed)
 Called patient and reviewed her labs.  She is taking atorvastatin  20mg  daily.  LDL is 200.  She would be willing to take a stronger dosage of lipitor.  Sent in a prescription for lipitor 40mg . Her kidney is leaking protein.  Please restart lisinopril  2.5mg  daily.  Please continue to take the Coreg  as well.   BP is not well controlled.  Start amodipine 10mg  daily.  FOLLOW-UP in office in a month to check BP.

## 2023-06-15 ENCOUNTER — Other Ambulatory Visit: Payer: Self-pay

## 2023-06-15 DIAGNOSIS — I10 Essential (primary) hypertension: Secondary | ICD-10-CM | POA: Diagnosis not present

## 2023-06-15 DIAGNOSIS — Z79899 Other long term (current) drug therapy: Secondary | ICD-10-CM | POA: Diagnosis not present

## 2023-06-15 DIAGNOSIS — G894 Chronic pain syndrome: Secondary | ICD-10-CM | POA: Diagnosis not present

## 2023-06-15 MED ORDER — BUPRENORPHINE HCL-NALOXONE HCL 8-2 MG SL FILM
1.0000 | ORAL_FILM | Freq: Two times a day (BID) | SUBLINGUAL | 1 refills | Status: DC
  Filled 2023-06-25: qty 60, 30d supply, fill #0
  Filled 2023-07-23: qty 60, 30d supply, fill #1

## 2023-06-16 ENCOUNTER — Other Ambulatory Visit: Payer: Self-pay

## 2023-06-16 ENCOUNTER — Other Ambulatory Visit: Payer: Self-pay | Admitting: Family Medicine

## 2023-06-16 DIAGNOSIS — F419 Anxiety disorder, unspecified: Secondary | ICD-10-CM

## 2023-06-17 ENCOUNTER — Other Ambulatory Visit: Payer: Self-pay

## 2023-06-18 ENCOUNTER — Other Ambulatory Visit: Payer: Self-pay

## 2023-06-18 ENCOUNTER — Other Ambulatory Visit: Payer: Self-pay | Admitting: Family Medicine

## 2023-06-18 DIAGNOSIS — F419 Anxiety disorder, unspecified: Secondary | ICD-10-CM

## 2023-06-18 NOTE — Telephone Encounter (Signed)
 Copied from CRM [id number]. Topic: Clinical - Red Word Triage >> Jun 18, 2023  5:14 PM Ethelle Herb L wrote: Red Word that prompted transfer to Nurse Triage: patient out of xanax , feeling a bit anxious - rx request denied

## 2023-06-18 NOTE — Telephone Encounter (Unsigned)
 Copied from CRM [id number]. Topic: Clinical - Medication Refill >> Jun 18, 2023 11:25 AM Lizabeth Riggs wrote: Most Recent Primary Care Visit:  Provider: ZIGLAR, SUSAN K  Department: [facility]  Visit Type: OFFICE VISIT  Date: 06/09/2023  Medication: ALPRAZolam  (XANAX ) 0.5 MG tablet   Has the patient contacted their pharmacy? Yes (Agent: If no, request that the patient contact the pharmacy for the refill. If patient does not wish to contact the pharmacy document the reason why and proceed with request.) (Agent: If yes, when and what did the pharmacy advise?) Pharmacy needs an order to refill  Is this the correct pharmacy for this prescription? Yes If no, delete pharmacy and type the correct one.  This is the patient's preferred pharmacy:  [facility] [address] [place] [address] Phone: [phone] Fax: [phone]   Has the prescription been filled recently? No  Is the patient out of the medication? Yes - She took her last pill yesterday (06/17/23)  Has the patient been seen for an appointment in the last year OR does the patient have an upcoming appointment? Yes  Can we respond through MyChart? No  Agent: Please be advised that Rx refills may take up to 3 business days. We ask that you follow-up with your pharmacy.

## 2023-06-19 ENCOUNTER — Other Ambulatory Visit: Payer: Self-pay

## 2023-06-19 MED ORDER — ALPRAZOLAM 0.5 MG PO TABS
0.5000 mg | ORAL_TABLET | Freq: Two times a day (BID) | ORAL | 3 refills | Status: DC
  Filled 2023-06-19: qty 60, 30d supply, fill #0
  Filled 2023-07-17: qty 60, 30d supply, fill #1
  Filled 2023-08-14: qty 60, 30d supply, fill #2
  Filled 2023-09-11: qty 60, 30d supply, fill #3

## 2023-06-19 NOTE — Telephone Encounter (Unsigned)
 Copied from CRM [id number]. Topic: Clinical - Prescription Issue >> Jun 19, 2023 11:06 AM [name] wrote: Reason for CRM: Patient stated that she called yesterday for an update on whether or not Dr. Ziglar will be approving the refill request for ALPRAZolam  (XANAX ). Patient stated that she has no heard back from anyone and she has to go to work today. Patient would like to know if the Rx refill will be approved. Please contact the patient to advise.

## 2023-06-19 NOTE — Telephone Encounter (Signed)
 Refill is on schedule.

## 2023-06-22 ENCOUNTER — Other Ambulatory Visit: Payer: Self-pay

## 2023-06-23 ENCOUNTER — Other Ambulatory Visit: Payer: Self-pay

## 2023-06-25 ENCOUNTER — Other Ambulatory Visit: Payer: Self-pay

## 2023-07-03 ENCOUNTER — Other Ambulatory Visit: Payer: Self-pay

## 2023-07-06 ENCOUNTER — Other Ambulatory Visit: Payer: Self-pay

## 2023-07-09 ENCOUNTER — Encounter: Payer: Self-pay | Admitting: Family Medicine

## 2023-07-09 ENCOUNTER — Ambulatory Visit (INDEPENDENT_AMBULATORY_CARE_PROVIDER_SITE_OTHER): Admitting: Family Medicine

## 2023-07-09 ENCOUNTER — Other Ambulatory Visit: Payer: Self-pay

## 2023-07-09 VITALS — BP 138/91 | HR 85 | Temp 97.8°F | Resp 18 | Ht 64.0 in | Wt 163.0 lb

## 2023-07-09 DIAGNOSIS — N1831 Chronic kidney disease, stage 3a: Secondary | ICD-10-CM | POA: Diagnosis not present

## 2023-07-09 DIAGNOSIS — Z1231 Encounter for screening mammogram for malignant neoplasm of breast: Secondary | ICD-10-CM | POA: Diagnosis not present

## 2023-07-09 DIAGNOSIS — E1129 Type 2 diabetes mellitus with other diabetic kidney complication: Secondary | ICD-10-CM | POA: Diagnosis not present

## 2023-07-09 DIAGNOSIS — F419 Anxiety disorder, unspecified: Secondary | ICD-10-CM | POA: Diagnosis not present

## 2023-07-09 DIAGNOSIS — R809 Proteinuria, unspecified: Secondary | ICD-10-CM

## 2023-07-09 DIAGNOSIS — F339 Major depressive disorder, recurrent, unspecified: Secondary | ICD-10-CM | POA: Diagnosis not present

## 2023-07-09 DIAGNOSIS — E2839 Other primary ovarian failure: Secondary | ICD-10-CM | POA: Diagnosis not present

## 2023-07-09 DIAGNOSIS — F418 Other specified anxiety disorders: Secondary | ICD-10-CM | POA: Diagnosis not present

## 2023-07-09 DIAGNOSIS — F3340 Major depressive disorder, recurrent, in remission, unspecified: Secondary | ICD-10-CM | POA: Diagnosis not present

## 2023-07-09 DIAGNOSIS — I214 Non-ST elevation (NSTEMI) myocardial infarction: Secondary | ICD-10-CM

## 2023-07-09 MED ORDER — GLIMEPIRIDE 1 MG PO TABS
1.0000 mg | ORAL_TABLET | Freq: Every day | ORAL | 3 refills | Status: AC
  Filled 2023-07-09: qty 30, 30d supply, fill #0

## 2023-07-09 MED ORDER — FLUOXETINE HCL 40 MG PO CAPS
40.0000 mg | ORAL_CAPSULE | Freq: Every day | ORAL | 3 refills | Status: AC
  Filled 2023-07-09 – 2023-09-30 (×2): qty 90, 90d supply, fill #0
  Filled 2023-12-18: qty 90, 90d supply, fill #1
  Filled 2024-03-23: qty 90, 90d supply, fill #2

## 2023-07-09 NOTE — Assessment & Plan Note (Signed)
 She had an increase in creatinine with trial of lisinopril  2.5 mg daily.  No longer taking ACE.

## 2023-07-09 NOTE — Assessment & Plan Note (Addendum)
 Ask her to get a bone mineral density scan.  Will check vitamin D  on next blood draw

## 2023-07-09 NOTE — Assessment & Plan Note (Signed)
 On 500mg  metformin  daily.  Had AKI 2 years ago.  Last Creatinine 1.23.  Stop metformin .  Take amaryl 1mg  daily instead.  Reviewed signs and symptoms of hypoglycemia: Dizziness, diaphoresis, confusion, headache and shakiness.

## 2023-07-09 NOTE — Progress Notes (Signed)
 Established Patient Office Visit  Subjective   Patient ID: Whitney Bernard, female    DOB: 10-12-1960  Age: 63 y.o. MRN: [id number]  Chief Complaint  Patient presents with   Hospitalization Follow-up    HPI delightful 63 year old with DMT2, CKD 3A, chronic migraines, DDD cervical spine and chronic neck pain (s/p 2 C-spine surgeries), CAD, ([facility] 2024), mixed hyperlipidemia, GERD and depression.      She is seeing a pain management doctor, Dr. Zannie Hey primarily for chronic neck pain.  She is s/p 2 C-spine surgeries.  She has started Suboxone  and it is helping with her pain.  Dr. Zannie Hey has asked her to taper off Xanax .  Her last fill was 06/19/2023 for #60.  She does not want her Xanax  refilled at this time. She is on fluoxetine  40 mg and Abilify  2 mg daily.  We increased her fluoxetine  from 20 to 40 mg at her last visit because she felt more sad and was less motivated.  She reports that her depression is better, her mood is improved, she is sleeping well and feels calmer.      Blood pressure today 162/97.  After letting her sit her blood pressure improved to 138/91.  She is not on an ACE or ARB because she has CKD 3A.  She is on Coreg  6.25 twice daily.      06/09/2023 A1c was 8.1%.  She is on Farxiga  10 mg daily.  Discussed the GLP-1 inhibitors today.  She does not tolerate nausea very well so she is not sure she would do well with these medications.  She is taking low-dose metformin  500 mg a day.  Her creatinine baseline is 1.23-1.42.  She is not checking her blood sugars.      She has not had a mammogram in a number of years.  When asked if she would seek treatment if she knew she had breast cancer, her answer was yes.  Under those circumstances it is best to get a mammogram.  Also ask her to get a DEXA scan since she has not had 1 of those in years.           ROS    Objective:     BP (!) 138/91 (BP Location: Left Arm, Patient Position: Sitting, Cuff Size: Normal)   Pulse 85   Temp 97.8  F (36.6 C) (Oral)   Resp 18   Ht 5\' 4"  (1.626 m)   Wt 163 lb (73.9 kg)   SpO2 96%   BMI 27.98 kg/m    Physical Exam Vitals reviewed.  Constitutional:      Appearance: Normal appearance.  HENT:     Head: Normocephalic.  Eyes:     General:        Right eye: No discharge.        Left eye: No discharge.  Cardiovascular:     Rate and Rhythm: Normal rate.  Pulmonary:     Effort: Pulmonary effort is normal.  Neurological:     Mental Status: She is alert and oriented to person, place, and time.  Psychiatric:        Mood and Affect: Mood normal.        Behavior: Behavior normal.        Thought Content: Thought content normal.        Judgment: Judgment normal.          No results found for any visits on 07/09/23.    The ASCVD Risk score (Arnett DK,  et al., 2019) failed to calculate for the following reasons:   Risk score cannot be calculated because patient has a medical history suggesting prior/existing ASCVD    Assessment & Plan:  Type 2 diabetes mellitus with diabetic microalbuminuria, without long-term current use of insulin  (HCC) Assessment & Plan: On 500mg  metformin  daily.  Had AKI 2 years ago.  Last Creatinine 1.23.  Stop metformin .  Take amaryl 1mg  daily instead.  Reviewed signs and symptoms of hypoglycemia: Dizziness, diaphoresis, confusion, headache and shakiness.  Orders: -     Glimepiride; Take 1 tablet (1 mg total) by mouth daily with breakfast.  Dispense: 30 tablet; Refill: 3  Depression, recurrent (HCC) -     FLUoxetine  HCl; Take 1 capsule (40 mg total) by mouth daily.  Dispense: 90 capsule; Refill: 3  Estrogen deficiency Assessment & Plan: Ask her to get a bone mineral density scan.  Will check vitamin D  on next blood draw  Orders: -     DG Bone Density; Future  Encounter for screening mammogram for malignant neoplasm of breast -     3D Screening Mammogram, Left and Right; Future  Depression with anxiety  Stage 3a chronic kidney disease  (HCC) Assessment & Plan: She had an increase in creatinine with trial of lisinopril  2.5 mg daily.  No longer taking ACE.   Recurrent major depressive disorder, in remission [facility]) Assessment & Plan: Last month we increased her fluoxetine  from 20 mg to 40 mg daily and she reports that she feels better.  Improvement in mood and more calm.  She reports her depression is improved.   Anxiety Assessment & Plan: She is on Suboxone  for chronic neck pain.  Pain specialist asked that she taper off Xanax .  Patient reports she is doing very well and is taking fewer than 1 Xanax  a day now. Reviewed PDMP.  She last received Xanax  5-20 65 for #60.   NSTEMI (non-ST elevated myocardial infarction) [facility]) Assessment & Plan: She is on Lipitor 40 mg daily.  06/09/2023 total cholesterol 286, Triggs 206, HDL 46 and LDL 200.  Started Lipitor 40 mg and then.  Will follow-up with labs in 2 months.      Return in about 2 months (around 09/08/2023).    Laverne Hursey K Bebe Moncure, MD

## 2023-07-09 NOTE — Assessment & Plan Note (Deleted)
 Last month we increased her fluoxetine  from 20 mg to 40 mg daily and she reports that she feels better.  Improvement in mood and more calm.  She reports her depression is improved.

## 2023-07-09 NOTE — Assessment & Plan Note (Signed)
 She is on Suboxone  for chronic neck pain.  Pain specialist asked that she taper off Xanax .  Patient reports she is doing very well and is taking fewer than 1 Xanax  a day now. Reviewed PDMP.  She last received Xanax  5-20 25 for #60.

## 2023-07-09 NOTE — Assessment & Plan Note (Signed)
 She is on Lipitor 40 mg daily.  06/09/2023 total cholesterol 286, Triggs 206, HDL 46 and LDL 200.  Started Lipitor 40 mg and then.  Will follow-up with labs in 2 months.

## 2023-07-09 NOTE — Assessment & Plan Note (Signed)
 Last month we increased her fluoxetine  from 20 mg to 40 mg daily and she reports that she feels better.  Improvement in mood and more calm.  She reports her depression is improved.

## 2023-07-14 ENCOUNTER — Other Ambulatory Visit: Payer: Self-pay

## 2023-07-16 ENCOUNTER — Other Ambulatory Visit: Payer: Self-pay

## 2023-07-17 ENCOUNTER — Other Ambulatory Visit: Payer: Self-pay

## 2023-07-20 ENCOUNTER — Other Ambulatory Visit: Payer: Self-pay | Admitting: Family Medicine

## 2023-07-20 ENCOUNTER — Other Ambulatory Visit: Payer: Self-pay

## 2023-07-20 DIAGNOSIS — G43709 Chronic migraine without aura, not intractable, without status migrainosus: Secondary | ICD-10-CM

## 2023-07-21 ENCOUNTER — Other Ambulatory Visit: Payer: Self-pay

## 2023-07-22 ENCOUNTER — Other Ambulatory Visit: Payer: Self-pay

## 2023-07-22 ENCOUNTER — Other Ambulatory Visit (HOSPITAL_COMMUNITY): Payer: Self-pay

## 2023-07-22 NOTE — Telephone Encounter (Signed)
Pt called to report that she is completely out of her current supply. Please advise

## 2023-07-23 ENCOUNTER — Other Ambulatory Visit: Payer: Self-pay

## 2023-07-23 ENCOUNTER — Other Ambulatory Visit: Payer: Self-pay | Admitting: Family Medicine

## 2023-07-23 ENCOUNTER — Ambulatory Visit: Payer: Self-pay

## 2023-07-23 DIAGNOSIS — G43709 Chronic migraine without aura, not intractable, without status migrainosus: Secondary | ICD-10-CM

## 2023-07-23 NOTE — Telephone Encounter (Signed)
 Attempted call to pt, automated message stating "Please enter your remote access code," then no opportunity for leaving voicemail. Nurse attempted call to inform pt that refill request has been refused due to being too early to refill and to triage pt for potential worsening symptoms. Placed in call back.  Copied from CRM [id number]. Topic: Clinical - Medication Refill >> Jul 23, 2023  1:40 PM Talmadge Fail S wrote: Medication: butalbital -acetaminophen -caffeine  (FIORICET ) 50-325-40 MG tablet   Has the patient contacted their pharmacy? Yes (Agent: If no, request that the patient contact the pharmacy for the refill. If patient does not wish to contact the pharmacy document the reason why and proceed with request.) (Agent: If yes, when and what did the pharmacy advise?)   This is the patient's preferred pharmacy:  [facility] [address] [place] [address] Phone: [phone] Fax: [phone]   Is this the correct pharmacy for this prescription? Yes If no, delete pharmacy and type the correct one.    Has the prescription been filled recently? Yes   Is the patient out of the medication? Yes   Has the patient been seen for an appointment in the last year OR does the patient have an upcoming appointment? Yes   Can we respond through MyChart? No   Agent: Please be advised that Rx refills may take up to 3 business days. We ask that you follow-up with your pharmacy.

## 2023-07-23 NOTE — Telephone Encounter (Signed)
 Opened triage encounter for pt refill request of Fioricet  for chronic migraines. See other nurse note for 6/5.

## 2023-07-23 NOTE — Telephone Encounter (Signed)
 3rd attempt, no answer and received a message "please enter access code" no option to leave a voicemail. Called [facility] and confirmed patient is out of refills, she last received a refill on 07/06/23  with a 15 day supply and is due for a refill. Routing to clinic.  Reason for Disposition  Third attempt to contact caller AND no contact made. Phone number verified.  Protocols used: No Contact or Duplicate Contact Call-A-AH

## 2023-07-23 NOTE — Telephone Encounter (Signed)
 Copied from CRM [id number]. Topic: Clinical - Medication Refill >> Jul 23, 2023  1:40 PM Talmadge Fail S wrote: Medication: butalbital -acetaminophen -caffeine  (FIORICET ) 50-325-40 MG tablet  Has the patient contacted their pharmacy? Yes (Agent: If no, request that the patient contact the pharmacy for the refill. If patient does not wish to contact the pharmacy document the reason why and proceed with request.) (Agent: If yes, when and what did the pharmacy advise?)  This is the patient's preferred pharmacy:  [facility] [address] [place] [address] Phone: [phone] Fax: [phone]  Is this the correct pharmacy for this prescription? Yes If no, delete pharmacy and type the correct one.   Has the prescription been filled recently? Yes  Is the patient out of the medication? Yes  Has the patient been seen for an appointment in the last year OR does the patient have an upcoming appointment? Yes  Can we respond through MyChart? No  Agent: Please be advised that Rx refills may take up to 3 business days. We ask that you follow-up with your pharmacy.

## 2023-07-23 NOTE — Telephone Encounter (Signed)
 2nd call attempt. No answer, no machine to leave message.

## 2023-07-24 ENCOUNTER — Other Ambulatory Visit: Payer: Self-pay

## 2023-07-24 ENCOUNTER — Other Ambulatory Visit: Payer: Self-pay | Admitting: Family Medicine

## 2023-07-27 ENCOUNTER — Other Ambulatory Visit: Payer: Self-pay

## 2023-07-27 DIAGNOSIS — G43709 Chronic migraine without aura, not intractable, without status migrainosus: Secondary | ICD-10-CM

## 2023-07-28 ENCOUNTER — Other Ambulatory Visit: Payer: Self-pay

## 2023-07-30 ENCOUNTER — Ambulatory Visit (INDEPENDENT_AMBULATORY_CARE_PROVIDER_SITE_OTHER): Admitting: Family Medicine

## 2023-07-30 ENCOUNTER — Other Ambulatory Visit: Payer: Self-pay

## 2023-07-30 ENCOUNTER — Encounter: Payer: Self-pay | Admitting: Family Medicine

## 2023-07-30 DIAGNOSIS — G43709 Chronic migraine without aura, not intractable, without status migrainosus: Secondary | ICD-10-CM

## 2023-07-30 MED ORDER — BUTALBITAL-APAP-CAFFEINE 50-325-40 MG PO TABS
1.0000 | ORAL_TABLET | ORAL | 2 refills | Status: DC | PRN
Start: 2023-07-30 — End: 2023-09-08
  Filled 2023-07-30: qty 90, 15d supply, fill #0
  Filled 2023-08-10 – 2023-08-11 (×2): qty 90, 15d supply, fill #1
  Filled 2023-08-25: qty 90, 15d supply, fill #2

## 2023-07-30 NOTE — Progress Notes (Signed)
   Established Patient Office Visit  Subjective   Patient ID: Whitney Bernard, female    DOB: April 24, 1960  Age: 63 y.o. MRN: [id number]  Chief Complaint  Patient presents with   Medical Management of Chronic Issues    Discussed the use of AI scribe software for clinical note transcription with the patient, who gave verbal consent to proceed.  History of Present Illness   Whitney Bernard is a 63 year old female who presents for a refill of Butalbital  due to frequent headaches.  She experiences frequent headaches and has been using Butalbital  for approximately fifteen years, taking two tablets as needed but not daily. She has not consulted a specialist for her headaches and has not tried other treatments.  She is currently tapering off Xanax  and reports doing okay with the process. She continues to take Abilify  and Prozac  40mg  daily. She has stopped taking sodium bicarbonate . She notes an increase in urination since discontinuing Lasix .         ROS    Objective:     BP (!) 170/95   Pulse 93   Ht 5' 4 (1.626 m)   Wt 166 lb (75.3 kg)   SpO2 95%   BMI 28.49 kg/m    Physical Exam Vitals reviewed.  Constitutional:      Appearance: Normal appearance.  HENT:     Head: Normocephalic.   Eyes:     General:        Right eye: No discharge.        Left eye: No discharge.    Cardiovascular:     Rate and Rhythm: Normal rate.  Pulmonary:     Effort: Pulmonary effort is normal.   Neurological:     Mental Status: She is alert and oriented to person, place, and time.   Psychiatric:        Mood and Affect: Mood normal.        Behavior: Behavior normal.        Thought Content: Thought content normal.        Judgment: Judgment normal.          No results found for any visits on 07/30/23.    The ASCVD Risk score (Arnett DK, et al., 2019) failed to calculate for the following reasons:   Risk score cannot be calculated because patient has a medical history suggesting  prior/existing ASCVD    Assessment & Plan:  Chronic migraine without aura without status migrainosus, not intractable -     Butalbital -APAP-Caffeine ; Take 1 tablet by mouth every 4 (four) hours as needed for headache.  Dispense: 90 tablet; Refill: 2 -     Ambulatory referral to Neurology  Assessment and Plan    Chronic Headaches Concern for butalbital  overuse. Advised neurologist consultation for non-addictive headache management. - Prescribe butalbital  cautiously. - Refer to neurologist for headache management.  Hypertension Blood pressure poorly controlled at 170/132 mmHg. Recheck 157/90.  Please check your BP at home and record.  FU in a month with BP readings  Medication Management Discontinued sodium bicarbonate  and Lasix , noted increased urination post-Lasix . - Review and update medication list for accuracy.         Return in about 4 weeks (around 08/27/2023).    Dempsey Ahonen K Meyer Dockery, MD

## 2023-08-05 ENCOUNTER — Ambulatory Visit: Payer: Medicare Other | Attending: Internal Medicine | Admitting: Internal Medicine

## 2023-08-05 NOTE — Progress Notes (Deleted)
  Cardiology Office Note:  .   Date:  08/05/2023  ID:  Whitney Bernard, DOB Oct 01, 1960, MRN [id number] PCP: Ziglar, Susan K, MD  [facility] Providers Cardiologist:  Sammy Crisp, MD { Click to update primary MD,subspecialty MD or APP then REFRESH:1}    History of Present Illness: .   Whitney Bernard is a 63 y.o. female with coronary artery disease status post PCI to the LCx in the setting of NSTEMI in 02/2019, hypertension, hyperlipidemia with statin intolerance, type 2 diabetes mellitus, hypothyroidism, chronic kidney disease, rheumatoid arthritis, COPD, migraine disorder, and chronic pain, who presents for reevaluation of CAD.  She was last seen in our office in 10/2019, at which time she complained of chronic fatigue and low energy.  She had been seen in the ED a few months earlier due to recurrent chest pain with moderate anemia noted as well as mild acute kidney injury superimposed on chronic kidney disease.  She had run out of prasugrel .  She had also stopped taking rosuvastatin  due to myalgias.  She was placed on clopidogrel  with plans for follow-up H&H.  She was continued on ezetimibe , as she wished to think a bit more about adding a PCSK9 inhibitor.  Blood pressure was also significantly elevated, prompting addition of carvedilol .  Most recent labs drawn in April are notable for creatinine of 1.4 and LDL of 200.  Hemoglobin in March was improved to 11.5.  ROS: See HPI  Studies Reviewed: .        *** Risk Assessment/Calculations:   {Does this patient have ATRIAL FIBRILLATION?[phone]} No BP recorded.  {Refresh Note OR Click here to enter BP  :1}***       Physical Exam:   VS:  There were no vitals taken for this visit.   Wt Readings from Last 3 Encounters:  07/30/23 166 lb (75.3 kg)  07/09/23 163 lb (73.9 kg)  06/09/23 162 lb (73.5 kg)    General:  NAD. Neck: No JVD or HJR. Lungs: Clear to auscultation bilaterally without wheezes or crackles. Heart: Regular rate and rhythm  without murmurs, rubs, or gallops. Abdomen: Soft, nontender, nondistended. Extremities: No lower extremity edema.  ASSESSMENT AND PLAN: .    ***    {Are you ordering a CV Procedure (e.g. stress test, cath, DCCV, TEE, etc)?   Press F2        :[id number]}  Dispo: ***  Signed, Sammy Crisp, MD

## 2023-08-06 ENCOUNTER — Other Ambulatory Visit: Payer: Self-pay

## 2023-08-07 ENCOUNTER — Other Ambulatory Visit: Payer: Self-pay

## 2023-08-10 ENCOUNTER — Other Ambulatory Visit: Payer: Self-pay

## 2023-08-11 ENCOUNTER — Other Ambulatory Visit: Payer: Self-pay

## 2023-08-13 ENCOUNTER — Other Ambulatory Visit: Payer: Self-pay

## 2023-08-14 ENCOUNTER — Other Ambulatory Visit: Payer: Self-pay

## 2023-08-14 ENCOUNTER — Other Ambulatory Visit (HOSPITAL_COMMUNITY): Payer: Self-pay

## 2023-08-17 ENCOUNTER — Other Ambulatory Visit: Payer: Self-pay

## 2023-08-20 ENCOUNTER — Other Ambulatory Visit: Payer: Self-pay

## 2023-08-20 MED ORDER — BUPRENORPHINE HCL-NALOXONE HCL 8-2 MG SL FILM
1.0000 | ORAL_FILM | Freq: Two times a day (BID) | SUBLINGUAL | 0 refills | Status: DC
  Filled 2023-08-20: qty 18, 9d supply, fill #0

## 2023-08-24 ENCOUNTER — Other Ambulatory Visit: Payer: Self-pay

## 2023-08-25 ENCOUNTER — Other Ambulatory Visit: Payer: Self-pay

## 2023-08-28 ENCOUNTER — Other Ambulatory Visit: Payer: Self-pay

## 2023-08-28 ENCOUNTER — Other Ambulatory Visit (HOSPITAL_COMMUNITY): Payer: Self-pay

## 2023-08-28 DIAGNOSIS — Z79899 Other long term (current) drug therapy: Secondary | ICD-10-CM | POA: Diagnosis not present

## 2023-08-28 DIAGNOSIS — G894 Chronic pain syndrome: Secondary | ICD-10-CM | POA: Diagnosis not present

## 2023-08-28 DIAGNOSIS — F1190 Opioid use, unspecified, uncomplicated: Secondary | ICD-10-CM | POA: Diagnosis not present

## 2023-08-28 MED ORDER — BUPRENORPHINE HCL-NALOXONE HCL 8-2 MG SL FILM
1.0000 | ORAL_FILM | Freq: Two times a day (BID) | SUBLINGUAL | 1 refills | Status: DC
  Filled 2023-09-01: qty 60, 30d supply, fill #0
  Filled 2023-09-30 (×2): qty 60, 30d supply, fill #1

## 2023-08-31 ENCOUNTER — Other Ambulatory Visit: Payer: Self-pay

## 2023-09-01 ENCOUNTER — Other Ambulatory Visit: Payer: Self-pay

## 2023-09-02 ENCOUNTER — Other Ambulatory Visit: Payer: Self-pay

## 2023-09-08 ENCOUNTER — Encounter: Payer: Self-pay | Admitting: Family Medicine

## 2023-09-08 ENCOUNTER — Other Ambulatory Visit: Payer: Self-pay

## 2023-09-08 ENCOUNTER — Ambulatory Visit (INDEPENDENT_AMBULATORY_CARE_PROVIDER_SITE_OTHER): Admitting: Family Medicine

## 2023-09-08 VITALS — BP 151/87 | HR 81 | Temp 98.3°F | Resp 18 | Ht 64.0 in | Wt 164.0 lb

## 2023-09-08 DIAGNOSIS — E1129 Type 2 diabetes mellitus with other diabetic kidney complication: Secondary | ICD-10-CM

## 2023-09-08 DIAGNOSIS — R809 Proteinuria, unspecified: Secondary | ICD-10-CM | POA: Diagnosis not present

## 2023-09-08 DIAGNOSIS — G43709 Chronic migraine without aura, not intractable, without status migrainosus: Secondary | ICD-10-CM

## 2023-09-08 DIAGNOSIS — N1831 Chronic kidney disease, stage 3a: Secondary | ICD-10-CM

## 2023-09-08 DIAGNOSIS — Z23 Encounter for immunization: Secondary | ICD-10-CM

## 2023-09-08 LAB — POCT GLYCOSYLATED HEMOGLOBIN (HGB A1C): Hemoglobin A1C: 9.2 % — AB (ref 4.0–5.6)

## 2023-09-08 MED ORDER — BUTALBITAL-APAP-CAFFEINE 50-325-40 MG PO TABS
1.0000 | ORAL_TABLET | ORAL | 2 refills | Status: DC | PRN
  Filled 2023-09-08: qty 90, 15d supply, fill #0
  Filled 2023-09-18 – 2023-09-21 (×2): qty 90, 15d supply, fill #1
  Filled 2023-10-05: qty 90, 15d supply, fill #2
  Filled ????-??-??: fill #2

## 2023-09-08 NOTE — Assessment & Plan Note (Signed)
 Has CKD 3 and creatinine runs 1.4.  Had to stop metformin  secondary to renal function.  A1c today is 9.2.  On Farxiga  10 mg daily and Amaryl  1 mg twice daily.  Discussed starting a GLP-1 inhibitor.  She is a good candidate.  No family history of men 2 or medullary thyroid  cancer.  Has had a cholecystectomy and has never had pancreatitis.  Advised more common side effects of nausea and constipation.  Will use Ozempic given secondary cardiac benefits.

## 2023-09-08 NOTE — Progress Notes (Signed)
 Established Patient Office Visit  Subjective   Patient ID: Whitney Bernard, female    DOB: 31-Oct-1960  Age: 63 y.o. MRN: [id number]  Chief Complaint  Patient presents with   Medical Management of Chronic Issues    HPI Delightful 63 year old with DMT2, CKD 3A, chronic migraines, DDD cervical spine and chronic neck pain (s/p 2 C-spine surgeries, followed by Dr. Casimir and on Suboxone ), CAD [facility] 2024), mixed hyperlipidemia, GERD and depression.   Discussed the use of AI scribe software for clinical note transcription with the patient, who gave verbal consent to proceed.  History of Present Illness   Whitney Bernard is a 63 year old female with hypertension and diabetes who presents for management of her blood pressure and diabetes.  Her blood pressure has been variable at home, with some readings being high. She is currently taking amlodipine  10 mg daily and coreg  6.25 mg BID. She previously discontinued lisinopril  because of her renal function.  She regularly monitors her blood pressure at home.  She has a history of diabetes and today her A1c is 9.2%. She is currently taking Amaryl  once daily and Farxiga . We stopped her metformin  because of her renal function.  She checks her blood sugars in the morning, often finding them in the 200s. She is trying to be careful with her diet, having successfully stopped drinking sodas, which she found challenging.  She has a history of kidney issues and saw a Dr. Dennise, a Nephrologist, about a year ago. She was advised to return to the kidney doctor in a year. She has not experienced kidney stones or glaucoma.  She experiences headaches that have remained unchanged and request a refill of Butalbitol.  She has an appointment with Neurology in about a month.     She is tapering off xanax  and is down to one every two to three days.    Discussed getting the new Prevnar vaccine and she agreed.         ROS    Objective:     BP (!) 151/87 (BP Location:  Left Arm, Patient Position: Sitting, Cuff Size: Normal)   Pulse 81   Temp 98.3 F (36.8 C) (Oral)   Resp 18   Ht 5' 4 (1.626 m)   Wt 164 lb (74.4 kg)   SpO2 98%   BMI 28.15 kg/m    Physical Exam Vitals reviewed.  Constitutional:      Appearance: Normal appearance.  HENT:     Head: Normocephalic.  Eyes:     General:        Right eye: No discharge.        Left eye: No discharge.  Cardiovascular:     Rate and Rhythm: Normal rate.  Pulmonary:     Effort: Pulmonary effort is normal.  Neurological:     Mental Status: She is alert and oriented to person, place, and time.  Psychiatric:        Mood and Affect: Mood normal.        Behavior: Behavior normal.        Thought Content: Thought content normal.        Judgment: Judgment normal.          Results for orders placed or performed in visit on 09/08/23  POCT glycosylated hemoglobin (Hb A1C)  Result Value Ref Range   Hemoglobin A1C 9.2 (A) 4.0 - 5.6 %   HbA1c POC (<> result, manual entry)     HbA1c, POC (prediabetic range)  HbA1c, POC (controlled diabetic range)        The ASCVD Risk score (Arnett DK, et al., 2019) failed to calculate for the following reasons:   Risk score cannot be calculated because patient has a medical history suggesting prior/existing ASCVD    Assessment & Plan:  Type 2 diabetes mellitus with diabetic microalbuminuria, without long-term current use of insulin  (HCC) Assessment & Plan: Has CKD 3 and creatinine runs 1.4.  Had to stop metformin  secondary to renal function.  A1c today is 9.2.  On Farxiga  10 mg daily and Amaryl  1 mg twice daily.  Discussed starting a GLP-1 inhibitor.  She is a good candidate.  No family history of men 2 or medullary thyroid  cancer.  Has had a cholecystectomy and has never had pancreatitis.  Advised more common side effects of nausea and constipation.  Will use Ozempic given secondary cardiac benefits.  Orders: -     POCT glycosylated hemoglobin (Hb  A1C)  Chronic migraine without aura without status migrainosus, not intractable -     Butalbital -APAP-Caffeine ; Take 1 tablet by mouth every 4 (four) hours as needed for headache.  Dispense: 90 tablet; Refill: 2  Immunization due -     Pneumococcal conjugate vaccine 20-valent  Chronic kidney disease, stage 3a (HCC) -     Ambulatory referral to Nephrology     Hypertension Hypertension is not well controlled with current medication regimen. Amlodipine  10 mg daily is insufficient to maintain blood pressure within target range. Lisinopril  was discontinued due to adverse effects on kidney function. A trial of hydrochlorothiazide is proposed to improve blood pressure control, with monitoring of kidney function to assess tolerance. - Add hydrochlorothiazide 25 mg daily to current regimen. - Monitor kidney function next month to assess impact of hydrochlorothiazide. - Refer to nephrologist for further evaluation of kidney function.  Type 2 Diabetes Mellitus Type 2 diabetes is poorly controlled with current medication regimen. Hemoglobin A1c is 9.2%, indicating suboptimal glycemic control. Amaryl  is currently being taken once daily. Metformin  is not an option due to borderline kidney function. Morning blood glucose levels are reported in the 200s. Initiation of Ozempic is recommended due to its efficacy in improving glycemic control and reducing cardiovascular risk. Potential side effects include nausea, constipation, gallbladder disease, and pancreatitis, though the latter is uncommon. - Initiate Ozempic with gradual dose escalation to improve glycemic control and reduce cardiovascular risk. - Schedule monthly follow-up visits to monitor response to Ozempic. - Encourage regular exercise and dietary modifications to aid in diabetes management.  Chronic Kidney Disease Chronic kidney disease is present, with previous nephrology consultation recommending annual follow-up. Current kidney function is  borderline, impacting medication choices such as metformin . Referral to nephrology is necessary for ongoing management. - Refer to nephrologist for follow-up evaluation.  Headaches Persistent headaches are reported. No contraindications for Topamax such as kidney stones or glaucoma. Neurology consultation is scheduled for next month to explore further treatment options. Topamax may be considered if neurology supports its use and no cognitive side effects occur. Potential cognitive side effects include confusion and memory issues. - Consider trial of Topamax 25 mg daily if neurology consultation supports this option and no cognitive side effects occur. - Refill butalbital  prescription as requested.  General Health Maintenance Pneumococcal vaccination is due. Discussed the importance of vaccination due to lowered immune function from kidney issues. She is considering COVID-19 vaccination in the fall. - Administer pneumococcal 20 vaccine today. - Encourage COVID-19 vaccination in the fall.  Return in about 4 weeks (around 10/06/2023).    Bryann Gentz K Camdon Saetern, MD

## 2023-09-11 ENCOUNTER — Other Ambulatory Visit: Payer: Self-pay

## 2023-09-18 ENCOUNTER — Other Ambulatory Visit: Payer: Self-pay

## 2023-09-21 ENCOUNTER — Other Ambulatory Visit: Payer: Self-pay

## 2023-09-30 ENCOUNTER — Other Ambulatory Visit: Payer: Self-pay

## 2023-10-01 ENCOUNTER — Other Ambulatory Visit: Payer: Self-pay

## 2023-10-05 ENCOUNTER — Other Ambulatory Visit: Payer: Self-pay

## 2023-10-09 ENCOUNTER — Encounter: Payer: Self-pay | Admitting: Family Medicine

## 2023-10-09 ENCOUNTER — Other Ambulatory Visit: Payer: Self-pay

## 2023-10-09 ENCOUNTER — Ambulatory Visit (INDEPENDENT_AMBULATORY_CARE_PROVIDER_SITE_OTHER): Admitting: Family Medicine

## 2023-10-09 VITALS — BP 152/86 | HR 67 | Temp 98.3°F | Resp 18 | Ht 64.0 in | Wt 166.0 lb

## 2023-10-09 DIAGNOSIS — E118 Type 2 diabetes mellitus with unspecified complications: Secondary | ICD-10-CM | POA: Diagnosis not present

## 2023-10-09 DIAGNOSIS — F419 Anxiety disorder, unspecified: Secondary | ICD-10-CM

## 2023-10-09 DIAGNOSIS — I10 Essential (primary) hypertension: Secondary | ICD-10-CM

## 2023-10-09 MED ORDER — ALPRAZOLAM 0.5 MG PO TABS
0.5000 mg | ORAL_TABLET | Freq: Two times a day (BID) | ORAL | 0 refills | Status: DC | PRN
Start: 2023-10-09 — End: 2023-11-11
  Filled 2023-10-09: qty 60, 30d supply, fill #0

## 2023-10-09 MED ORDER — HYDROCHLOROTHIAZIDE 25 MG PO TABS
12.5000 mg | ORAL_TABLET | Freq: Every day | ORAL | 1 refills | Status: AC
  Filled 2023-10-09: qty 30, 60d supply, fill #0

## 2023-10-09 MED ORDER — OZEMPIC (0.25 OR 0.5 MG/DOSE) 2 MG/3ML ~~LOC~~ SOPN
PEN_INJECTOR | SUBCUTANEOUS | 1 refills | Status: AC
  Filled 2023-10-09: qty 3, 28d supply, fill #0
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
## 2023-10-15 ENCOUNTER — Other Ambulatory Visit: Payer: Self-pay | Admitting: Family Medicine

## 2023-10-15 ENCOUNTER — Other Ambulatory Visit: Payer: Self-pay

## 2023-10-15 DIAGNOSIS — G43709 Chronic migraine without aura, not intractable, without status migrainosus: Secondary | ICD-10-CM

## 2023-10-16 ENCOUNTER — Other Ambulatory Visit: Payer: Self-pay

## 2023-10-16 ENCOUNTER — Other Ambulatory Visit: Payer: Self-pay | Admitting: Family Medicine

## 2023-10-16 DIAGNOSIS — G43709 Chronic migraine without aura, not intractable, without status migrainosus: Secondary | ICD-10-CM

## 2023-10-19 DIAGNOSIS — E118 Type 2 diabetes mellitus with unspecified complications: Secondary | ICD-10-CM | POA: Insufficient documentation

## 2023-10-19 NOTE — Assessment & Plan Note (Signed)
 Starting Ozempic  0.25 mg once weekly for 4 weeks then 0.5 mg weekly thereafter.  Follow-up in 2 months for reassessment.

## 2023-10-19 NOTE — Assessment & Plan Note (Signed)
 She understands she cannot take Suboxone  and Xanax  at the same time.  She is completely off Xanax  for about a month now but cannot tolerate the anxiety.  Would prefer to get back on Xanax .  Refilled her Xanax  0.5 mg twice daily #60.

## 2023-10-19 NOTE — Assessment & Plan Note (Signed)
 Blood pressure is not well-controlled.  Did not tolerate an ACE inhibitor secondary to renal function.  She is currently on amlodipine  and will add HCTZ 12.5 mg.  Follow-up in a month to recheck your blood pressure.

## 2023-10-20 ENCOUNTER — Other Ambulatory Visit: Payer: Self-pay

## 2023-10-20 ENCOUNTER — Other Ambulatory Visit (HOSPITAL_BASED_OUTPATIENT_CLINIC_OR_DEPARTMENT_OTHER): Payer: Self-pay

## 2023-10-20 ENCOUNTER — Other Ambulatory Visit: Payer: Self-pay | Admitting: Family Medicine

## 2023-10-20 DIAGNOSIS — G43709 Chronic migraine without aura, not intractable, without status migrainosus: Secondary | ICD-10-CM

## 2023-10-20 NOTE — Telephone Encounter (Signed)
 Copied from CRM [id number]. Topic: Clinical - Prescription Issue >> Oct 20, 2023  2:16 PM Winona R wrote: Pharmacy has been requesting medication refill and has not heard anything back. There are several pharmacy request on the medication already.  butalbital -acetaminophen -caffeine  (FIORICET ) 50-325-40 MG tablet [[id number]]

## 2023-10-21 ENCOUNTER — Ambulatory Visit: Payer: Self-pay | Admitting: Family Medicine

## 2023-10-21 ENCOUNTER — Other Ambulatory Visit: Payer: Self-pay

## 2023-10-21 NOTE — Telephone Encounter (Signed)
 FYI Only or Action Required?: FYI only for provider.  Patient was last seen in primary care on 10/09/2023 by Ziglar, Susan K, MD.  Called Nurse Triage reporting Numbness.  Symptoms began several weeks ago.  Interventions attempted: Nothing.  Symptoms are: unchanged.  Triage Disposition: Go to ED Now (Notify PCP)  Patient/caregiver understands and will follow disposition?: Yes                    Copied from CRM [id number]. Topic: Clinical - Red Word Triage >> Oct 21, 2023  2:05 PM Larissa S wrote: [facility] that prompted transfer to Nurse Triage: numbness in face and neck    ----------------------------------------------------------------------- From previous Reason for Contact - Scheduling: Patient/patient representative is calling to schedule an appointment. Refer to attachments for appointment information. Reason for Disposition  [1] Weakness (i.e., paralysis, loss of muscle strength) of the face, arm / hand, or leg / foot on one side of the body AND [2] sudden onset AND [3] brief (now gone)  Answer Assessment - Initial Assessment Questions This RN recommends pt goes to ED and has another adult drive her there. Pt agreeable. Pt states she is going to ED but wants to go ahead and schedule a follow up visit with her PCP as she has some other questions for her. This RN asked if pt is experiencing other symptoms and pt states no its just a follow up visit. This RN scheduled pt for first available appointment on Fri, 9/5, in office.   SYMPTOM: What is the main symptom you are concerned about? (e.g., weakness, numbness)     Numbness in face and neck intermittent on right side; not currently  ONSET: When did this start? (e.g., minutes, hours, days; while sleeping)     A couple of weeks ago; pt states she had a blockage before  PATTERN Does this come and go, or has it been constant since it started?  Is it present now?     Happening daily and lasts 2-3 mins at a  time  CARDIAC SYMPTOMS: Have you had any of the following symptoms: chest pain, difficulty breathing, palpitations?     Denies  NEUROLOGIC SYMPTOMS: Have you had any of the following symptoms: headache, dizziness, vision loss, double vision, changes in speech, unsteady on your feet?     Denies  OTHER SYMPTOMS: Do you have any other symptoms?     No  Protocols used: Neurologic Deficit-A-AH

## 2023-10-22 ENCOUNTER — Other Ambulatory Visit: Payer: Self-pay

## 2023-10-23 ENCOUNTER — Encounter: Payer: Self-pay | Admitting: Family Medicine

## 2023-10-23 ENCOUNTER — Ambulatory Visit (INDEPENDENT_AMBULATORY_CARE_PROVIDER_SITE_OTHER): Admitting: Family Medicine

## 2023-10-23 ENCOUNTER — Other Ambulatory Visit: Payer: Self-pay

## 2023-10-23 VITALS — BP 122/80 | HR 108 | Temp 98.3°F | Resp 18 | Ht 64.0 in | Wt 160.0 lb

## 2023-10-23 DIAGNOSIS — I10 Essential (primary) hypertension: Secondary | ICD-10-CM

## 2023-10-23 DIAGNOSIS — G43709 Chronic migraine without aura, not intractable, without status migrainosus: Secondary | ICD-10-CM | POA: Diagnosis not present

## 2023-10-23 DIAGNOSIS — F339 Major depressive disorder, recurrent, unspecified: Secondary | ICD-10-CM | POA: Diagnosis not present

## 2023-10-23 MED ORDER — ARIPIPRAZOLE 5 MG PO TABS
5.0000 mg | ORAL_TABLET | Freq: Every day | ORAL | 1 refills | Status: AC
  Filled 2023-10-23: qty 90, 90d supply, fill #0
  Filled 2024-02-15: qty 90, 90d supply, fill #1

## 2023-10-23 MED ORDER — BUTALBITAL-APAP-CAFFEINE 50-325-40 MG PO TABS
1.0000 | ORAL_TABLET | ORAL | 2 refills | Status: DC | PRN
Start: 2023-10-23 — End: 2023-12-11
  Filled 2023-10-23: qty 90, 15d supply, fill #0
  Filled 2023-11-05: qty 90, 15d supply, fill #1
  Filled 2023-11-13 – 2023-11-17 (×2): qty 90, 15d supply, fill #2

## 2023-10-23 MED ORDER — BENZTROPINE MESYLATE 2 MG PO TABS
2.0000 mg | ORAL_TABLET | Freq: Two times a day (BID) | ORAL | 1 refills | Status: AC
  Filled 2023-10-23: qty 180, 90d supply, fill #0

## 2023-10-23 NOTE — Assessment & Plan Note (Signed)
 She is on Prozac  40mg  daily  Abilify  2mg  daily.  Asked her if she wants to see a psychiatrist and she does not.  Will increase Abilify  to 5mg  daily.  Asked her to take cogentin  2mg  BID with this increased Abilify  dosage to lessen chances of Tardive Dyskinesia

## 2023-10-23 NOTE — Assessment & Plan Note (Signed)
 Taking amlodipine  10mg  daily and hydrochlorothiazide  12.5mg  (1/2 of 25 mg tablet) daily.  Second check of her BP was controlled at 122/80.

## 2023-10-23 NOTE — Progress Notes (Signed)
 Established Patient Office Visit  Subjective   Patient ID: Whitney Bernard, female    DOB: 1961-02-16  Age: 63 y.o. MRN: [id number]  Chief Complaint  Patient presents with   Medical Management of Chronic Issues   Numbness    Right side of neck and shoulder     HPI Delightful 63 year old with DMT2, CKD 3A, chronic migraines, DDD cervical spine and chronic neck pain (s/p 2 C-spine surgeries, tried suboxone  but found it was not very effective), CAD (MI 05/2019, LHC 06/07/2019: CIRC PCI and DES), mixed hyperlipidemia, GERD and depression.   Discussed the use of AI scribe software for clinical note transcription with the patient, who gave verbal consent to proceed.  History of Present Illness   Whitney Bernard is a 63 year old female with depression who presents with worsening depressive symptoms.  She has been experiencing a persistent struggle with depression, which has worsened following the recent passing of her father-in-law. She describes the depression as 'just in general' and notes that the death 'just kind of added to it.' These symptoms were present prior to the bereavement.   She is currently taking Abilify  2mg  and Prozac  40mg  q day. Despite this medication regimen, she continues to experience significant depressive symptoms and expresses a need for a refill on her Abilify .  She has been trying to engage in physical activity by walking. She also monitors her blood pressure at home, reporting readings in the 140s/80 range. She recently started taking HCTZ  12.5 mg two weeks ago and has not experienced any adverse effects.  She is also on Ozempic  0.25mg  weekly and reports a decrease in appetite, which she attributes to the medication. No gastrointestinal side effects such as constipation, diarrhea, or nausea. She has had two shots of Ozempic  so far.  She requests a refill for Fioricet , which she takes one or two times a day for headaches and neck pain.        Objective:     BP 122/80  (BP Location: Left Arm, Patient Position: Sitting, Cuff Size: Normal)   Pulse (!) 108   Temp 98.3 F (36.8 C) (Oral)   Resp 18   Ht 5' 4 (1.626 m)   Wt 160 lb (72.6 kg)   SpO2 91%   BMI 27.46 kg/m    Physical Exam Vitals reviewed.  Constitutional:      Appearance: Normal appearance.  HENT:     Head: Normocephalic.  Eyes:     General:        Right eye: No discharge.        Left eye: No discharge.  Cardiovascular:     Rate and Rhythm: Normal rate.  Pulmonary:     Effort: Pulmonary effort is normal.  Neurological:     Mental Status: She is alert and oriented to person, place, and time.  Psychiatric:        Mood and Affect: Mood normal.        Behavior: Behavior normal.        Thought Content: Thought content normal.        Judgment: Judgment normal.          No results found for any visits on 10/23/23.    The ASCVD Risk score (Arnett DK, et al., 2019) failed to calculate for the following reasons:   Risk score cannot be calculated because patient has a medical history suggesting prior/existing ASCVD    Assessment & Plan:  Depression, recurrent (HCC) Assessment & Plan:  She is on Prozac  40mg  daily  Abilify  2mg  daily.  Asked her if she wants to see a psychiatrist and she does not.  Will increase Abilify  to 5mg  daily.  Asked her to take cogentin  2mg  BID with this increased Abilify  dosage to lessen chances of Tardive Dyskinesia  Orders: -     ARIPiprazole ; Take 1 tablet (5 mg total) by mouth daily.  Dispense: 90 tablet; Refill: 1 -     Benztropine  Mesylate; Take 1 tablet (2 mg total) by mouth 2 (two) times daily.  Dispense: 180 tablet; Refill: 1  Chronic migraine without aura without status migrainosus, not intractable -     Butalbital -APAP-Caffeine ; Take 1 tablet by mouth every 4 (four) hours as needed for headache.  Dispense: 90 tablet; Refill: 2  Essential hypertension Assessment & Plan: Taking amlodipine  10mg  daily and hydrochlorothiazide  12.5mg  (1/2 of 25 mg  tablet) daily.  Second check of her BP was controlled at 122/80.      Return in about 2 weeks (around 11/06/2023).    Quinn Bartling K Ziah Turvey, MD

## 2023-10-24 ENCOUNTER — Other Ambulatory Visit: Payer: Self-pay

## 2023-10-26 ENCOUNTER — Other Ambulatory Visit: Payer: Self-pay

## 2023-10-27 ENCOUNTER — Other Ambulatory Visit: Payer: Self-pay

## 2023-10-29 ENCOUNTER — Other Ambulatory Visit: Payer: Self-pay

## 2023-10-29 DIAGNOSIS — Z23 Encounter for immunization: Secondary | ICD-10-CM | POA: Diagnosis not present

## 2023-10-29 DIAGNOSIS — G894 Chronic pain syndrome: Secondary | ICD-10-CM | POA: Diagnosis not present

## 2023-10-29 MED ORDER — BUPRENORPHINE HCL-NALOXONE HCL 8-2 MG SL FILM
1.0000 | ORAL_FILM | Freq: Two times a day (BID) | SUBLINGUAL | 1 refills | Status: DC
  Filled 2023-10-29: qty 60, 30d supply, fill #0
  Filled 2023-11-23 – 2023-11-27 (×3): qty 60, 30d supply, fill #1

## 2023-11-02 ENCOUNTER — Other Ambulatory Visit: Payer: Self-pay

## 2023-11-05 ENCOUNTER — Other Ambulatory Visit: Payer: Self-pay

## 2023-11-11 ENCOUNTER — Telehealth: Payer: Self-pay

## 2023-11-11 ENCOUNTER — Ambulatory Visit (INDEPENDENT_AMBULATORY_CARE_PROVIDER_SITE_OTHER): Admitting: Family Medicine

## 2023-11-11 ENCOUNTER — Encounter: Payer: Self-pay | Admitting: Family Medicine

## 2023-11-11 ENCOUNTER — Other Ambulatory Visit: Payer: Self-pay

## 2023-11-11 VITALS — BP 135/87 | HR 97 | Temp 98.2°F | Resp 18 | Ht 64.0 in | Wt 160.0 lb

## 2023-11-11 DIAGNOSIS — G8929 Other chronic pain: Secondary | ICD-10-CM | POA: Diagnosis not present

## 2023-11-11 DIAGNOSIS — F419 Anxiety disorder, unspecified: Secondary | ICD-10-CM

## 2023-11-11 DIAGNOSIS — E118 Type 2 diabetes mellitus with unspecified complications: Secondary | ICD-10-CM | POA: Diagnosis not present

## 2023-11-11 DIAGNOSIS — G43709 Chronic migraine without aura, not intractable, without status migrainosus: Secondary | ICD-10-CM | POA: Diagnosis not present

## 2023-11-11 DIAGNOSIS — R519 Other chronic pain: Secondary | ICD-10-CM

## 2023-11-11 MED ORDER — ALPRAZOLAM 0.5 MG PO TABS
0.5000 mg | ORAL_TABLET | Freq: Two times a day (BID) | ORAL | 3 refills | Status: DC | PRN
  Filled 2023-11-11: qty 60, 30d supply, fill #0
  Filled 2023-12-09 – 2023-12-10 (×2): qty 60, 30d supply, fill #1
  Filled 2024-01-07: qty 60, 30d supply, fill #2
  Filled 2024-02-04: qty 60, 30d supply, fill #3

## 2023-11-11 MED ORDER — OZEMPIC (0.25 OR 0.5 MG/DOSE) 2 MG/3ML ~~LOC~~ SOPN
0.5000 mg | PEN_INJECTOR | SUBCUTANEOUS | 0 refills | Status: DC
  Filled 2023-11-11: qty 3, 28d supply, fill #0

## 2023-11-11 NOTE — Assessment & Plan Note (Signed)
 She takes Fiorcet 1-2 daily.  Would like her to see Neurology and see if there is something else that might work for her that is not addictive.

## 2023-11-11 NOTE — Assessment & Plan Note (Addendum)
 FBS was 192 today.  On Ozempic  0.25mg  weekly.  Tolerating well.  Will increase to Ozempic  0.5mg  weekly and see her back in 4 weeks.  Asked her to wear shoes in the house ad to see the eye doctor.

## 2023-11-11 NOTE — Assessment & Plan Note (Signed)
 She is currently on Prozac  40mg  and Abilify .  She also takes xanax  0.5mg  BID.  She is complaining of anxiety and depression.  Advised she needs a psychiatrist to adjust her medicne

## 2023-11-11 NOTE — Telephone Encounter (Signed)
 Copied from CRM #[id number]. Topic: Referral - Question >> Nov 11, 2023  1:17 PM Antony RAMAN wrote: Reason for CRM: referral that was sent to dr zack baslow, he only does neurology for cancer patients not psychiatry

## 2023-11-11 NOTE — Progress Notes (Addendum)
 Established Patient Office Visit  Subjective   Patient ID: Whitney Bernard, female    DOB: 1960-08-24  Age: 63 y.o. MRN: [id number]  Chief Complaint  Patient presents with   Medical Management of Chronic Issues    HPI Delightful 63 year old with DMT2, CKD 3A, chronic migraines, DDD cervical spine and chronic neck pain (s/p 2 C-spine surgeries, tried suboxone  but found it was not very effective), CAD (MI 05/2019, LHC 06/07/2019: CIRC PCI and DES), mixed hyperlipidemia, GERD and depression.  Discussed the use of AI scribe software for clinical note transcription with the patient, who gave verbal consent to proceed.  History of Present Illness    She experiences panic attacks and describes feeling 'shaky on the inside.' She is currently taking Xanax  0.5mg  twice a day, Abilify , and Prozac  40mg . She requests a refill for Xanax . Additionally, she takes one or two Fioricet  daily.She has chronic daily headaches attributed to two cervical surgeries.    Her diabetes management includes a recent A1c of 9.2%. She started Ozempic , which she has tolerated well without nausea or constipation. She takes the injection weekly on Thursdays and that tomorrow will be her fourth dose. Reminded her that she needs to see an eye doctor and get her retina checked.  Her FBS was 192 yesterday.    Her blood pressure was recorded at 135/87 today, and she reports it has been better at home. She is currently on amlodipine  for hypertension.  She mentions ongoing depression, though it is not as severe as before. She is on Abilify  and Prozac , with Prozac  at a dose of 40 mg.  She has not attended a follow-up appointment with a neurologist for her headaches and needs to schedule one.   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 She checks her feet daily and is aware of the importance of foot care due to her diabetes.      Objective:     BP 135/87 (BP Location: Left Arm,  Patient Position: Sitting, Cuff Size: Normal)   Pulse 97   Temp 98.2 F (36.8 C) (Oral)   Resp 18   Ht 5' 4 (1.626 m)   Wt 160 lb (72.6 kg)   SpO2 93%   BMI 27.46 kg/m    Physical Exam Vitals reviewed.  Constitutional:      Appearance: Normal appearance.  HENT:     Head: Normocephalic.  Eyes:     General:        Right eye: No discharge.        Left eye: No discharge.  Cardiovascular:     Rate and Rhythm: Normal rate.     Pulses:          Dorsalis pedis pulses are 2+ on the right side and 2+ on the left side.  Pulmonary:     Effort: Pulmonary effort is normal.  Feet:     Right foot:     Protective Sensation: 5 sites tested.  5 sites sensed.     Skin integrity: Skin integrity normal.     Toenail Condition: Fungal disease present.    Left foot:     Protective Sensation: 5 sites tested.  5 sites sensed.     Skin integrity: Skin integrity normal.     Toenail Condition: Fungal disease present. Neurological:     Mental Status: She is alert and oriented to person, place, and time.  Psychiatric:  Mood and Affect: Mood normal.        Behavior: Behavior normal.        Thought Content: Thought content normal.        Judgment: Judgment normal.          No results found for any visits on 11/11/23.    The ASCVD Risk score (Arnett DK, et al., 2019) failed to calculate for the following reasons:   Risk score cannot be calculated because patient has a medical history suggesting prior/existing ASCVD    Assessment & Plan:  Chronic nonintractable headache, unspecified headache type -     Ambulatory referral to Neurology  Anxiety Assessment & Plan: She is currently on Prozac  40mg  and Abilify .  She also takes xanax  0.5mg  BID.  She is complaining of anxiety and depression.  Advised she needs a psychiatrist to adjust her medicne  Orders: -     ALPRAZolam ; Take 1 tablet (0.5 mg total) by mouth 2 (two) times daily as needed for anxiety.  Dispense: 60 tablet; Refill:  3 -     Ambulatory referral to Psychiatry  Controlled type 2 diabetes mellitus with complication, without long-term current use of insulin  (HCC) Assessment & Plan: FBS was 192 today.  On Ozempic  0.25mg  weekly.  Tolerating well.  Will increase to Ozempic  0.5mg  weekly and see her back in 4 weeks.  Asked her to wear shoes in the house ad to see the eye doctor.  Orders: -     Ozempic  (0.25 or 0.5 MG/DOSE); Inject 0.5 mg into the skin once a week.  Dispense: 3 mL; Refill: 0  Chronic migraine w/o aura w/o status migrainosus, not intractable Assessment & Plan: She takes Fiorcet 1-2 daily.  Would like her to see Neurology and see if there is something else that might work for her that is not addictive.        Return in about 4 weeks (around 12/09/2023).    Tatsuo Musial K Harnoor Kohles, MD

## 2023-11-13 ENCOUNTER — Other Ambulatory Visit: Payer: Self-pay

## 2023-11-17 ENCOUNTER — Other Ambulatory Visit: Payer: Self-pay

## 2023-11-23 ENCOUNTER — Other Ambulatory Visit: Payer: Self-pay

## 2023-11-24 ENCOUNTER — Other Ambulatory Visit: Payer: Self-pay

## 2023-11-26 ENCOUNTER — Other Ambulatory Visit: Payer: Self-pay

## 2023-11-27 ENCOUNTER — Other Ambulatory Visit: Payer: Self-pay

## 2023-12-04 ENCOUNTER — Other Ambulatory Visit: Payer: Self-pay

## 2023-12-09 ENCOUNTER — Other Ambulatory Visit: Payer: Self-pay

## 2023-12-10 ENCOUNTER — Other Ambulatory Visit: Payer: Self-pay

## 2023-12-10 ENCOUNTER — Other Ambulatory Visit (HOSPITAL_COMMUNITY): Payer: Self-pay

## 2023-12-11 ENCOUNTER — Encounter: Payer: Self-pay | Admitting: Family Medicine

## 2023-12-11 ENCOUNTER — Ambulatory Visit: Admitting: Family Medicine

## 2023-12-11 ENCOUNTER — Other Ambulatory Visit: Payer: Self-pay

## 2023-12-11 VITALS — BP 127/85 | HR 90 | Temp 98.3°F | Resp 18 | Ht 64.0 in | Wt 162.0 lb

## 2023-12-11 DIAGNOSIS — E1129 Type 2 diabetes mellitus with other diabetic kidney complication: Secondary | ICD-10-CM | POA: Diagnosis not present

## 2023-12-11 DIAGNOSIS — R809 Proteinuria, unspecified: Secondary | ICD-10-CM | POA: Diagnosis not present

## 2023-12-11 DIAGNOSIS — E1169 Type 2 diabetes mellitus with other specified complication: Secondary | ICD-10-CM

## 2023-12-11 DIAGNOSIS — N1832 Chronic kidney disease, stage 3b: Secondary | ICD-10-CM

## 2023-12-11 DIAGNOSIS — G43709 Chronic migraine without aura, not intractable, without status migrainosus: Secondary | ICD-10-CM

## 2023-12-11 LAB — POCT GLYCOSYLATED HEMOGLOBIN (HGB A1C): Hemoglobin A1C: 9.7 % — AB (ref 4.0–5.6)

## 2023-12-11 MED ORDER — BUTALBITAL-APAP-CAFFEINE 50-325-40 MG PO TABS
1.0000 | ORAL_TABLET | ORAL | 2 refills | Status: DC | PRN
  Filled 2023-12-11: qty 90, 15d supply, fill #0
  Filled 2023-12-25: qty 90, 15d supply, fill #1
  Filled 2024-01-07: qty 90, 15d supply, fill #2

## 2023-12-11 MED ORDER — SEMAGLUTIDE (1 MG/DOSE) 4 MG/3ML ~~LOC~~ SOPN
1.0000 mg | PEN_INJECTOR | SUBCUTANEOUS | 0 refills | Status: DC
  Filled 2023-12-11: qty 3, 28d supply, fill #0

## 2023-12-11 MED ORDER — METFORMIN HCL ER 500 MG PO TB24
500.0000 mg | ORAL_TABLET | Freq: Two times a day (BID) | ORAL | 1 refills | Status: DC
  Filled 2023-12-11: qty 180, 90d supply, fill #0

## 2023-12-11 NOTE — Progress Notes (Signed)
 Established Patient Office Visit  Subjective   Patient ID: Whitney Bernard, female    DOB: [birth year]  Age: 63 y.o. MRN: [id number]  Chief Complaint  Patient presents with   Medical Management of Chronic Issues    HPI  Discussed the use of AI scribe software for clinical note transcription with the patient, who gave verbal consent to proceed.  History of Present Illness   Whitney Bernard is a delightful 63 year old with DMT2, CKD 3A (Cr 1.4 baseline), chronic migraines, DDD cervical spine and chronic neck pain (s/p 2 C-spine surgeries, tried suboxone  but found it not effective), CAD (MI 05/2019, LHC 06/07/2019: CIRC PCI and DES), mixed hyperlipidemia, GERD and depression.   Her fasting blood sugar level was 255 mg/dL this morning, which is an improvement from previous readings in the 300s and 400s. She is currently taking 0.5 mg of Ozempic  without experiencing side effects such as nausea, constipation, or vomiting. She also has Farxiga  10mg  daily.  She has a bottle of metformin  at home from a previous prescription but is not currently taking it.  She cannot take metformin  because her renal function is inadequate.    She has a long history of depression for approximately 37 years. Her mood has been improving, and she feels better, not depressed, and more active, more motivated. There is a family history of depression and mental illness on both sides of her family.  Her blood pressure was 140/89 this morning, consistent with her home readings, and she recalls a previous reading of 135/87 during her last visit. She is taking an 81 mg aspirin  daily and has lost about 12 pounds recently.  Objective:     BP 127/85 (BP Location: Left Arm, Patient Position: Sitting, Cuff Size: Normal)   Pulse 90   Temp 98.3 F (36.8 C) (Oral)   Resp 18   Ht 5' 4 (1.626 m)   Wt 162 lb (73.5 kg)   SpO2 97%   BMI 27.81 kg/m    Physical Exam Vitals and nursing note reviewed.  Constitutional:      Appearance:  Normal appearance.  HENT:     Head: Normocephalic and atraumatic.  Eyes:     Conjunctiva/sclera: Conjunctivae normal.  Cardiovascular:     Rate and Rhythm: Normal rate and regular rhythm.  Pulmonary:     Effort: Pulmonary effort is normal.     Breath sounds: Normal breath sounds.  Musculoskeletal:     Right lower leg: No edema.     Left lower leg: No edema.  Skin:    General: Skin is warm and dry.  Neurological:     Mental Status: She is alert and oriented to person, place, and time.  Psychiatric:        Mood and Affect: Mood normal.        Behavior: Behavior normal.        Thought Content: Thought content normal.        Judgment: Judgment normal.          Results for orders placed or performed in visit on 12/11/23  POCT glycosylated hemoglobin (Hb A1C)  Result Value Ref Range   Hemoglobin A1C 9.7 (A) 4.0 - 5.6 %   HbA1c POC (<> result, manual entry)     HbA1c, POC (prediabetic range)     HbA1c, POC (controlled diabetic range)        The ASCVD Risk score (Arnett DK, et al., 2019) failed to calculate for the following reasons:  Risk score cannot be calculated because patient has a medical history suggesting prior/existing ASCVD    Assessment & Plan:  Type 2 diabetes mellitus with diabetic microalbuminuria, without long-term current use of insulin  (HCC) -     POCT glycosylated hemoglobin (Hb A1C) -     Semaglutide  (1 MG/DOSE); Inject 1 mg into the skin once a week.  Dispense: 3 mL; Refill: 0  Chronic migraine without aura without status migrainosus, not intractable -     Butalbital -APAP-Caffeine ; Take 1 tablet by mouth every 4 (four) hours as needed for headache.  Dispense: 90 tablet; Refill: 2  Stage 3b chronic kidney disease (HCC) Assessment & Plan: Baseline creatinine is 1.4.  She cannot take metformin .  She is on Farxiga .     Type 2 diabetes mellitus with other specified complication, without long-term current use of insulin  [facility]) Assessment & Plan: She is  on Ozempic  0.5 mg weekly.  Increase to Ozempic  1 mg weekly.  Follow-up in a month to reassess GLP-1.      No follow-ups on file.    Whitney Wan K Gretchen Weinfeld, MD

## 2023-12-12 DIAGNOSIS — E1169 Type 2 diabetes mellitus with other specified complication: Secondary | ICD-10-CM | POA: Insufficient documentation

## 2023-12-12 NOTE — Assessment & Plan Note (Signed)
 She is on Ozempic  0.5 mg weekly.  Increase to Ozempic  1 mg weekly.  Follow-up in a month to reassess GLP-1.

## 2023-12-12 NOTE — Assessment & Plan Note (Signed)
 Baseline creatinine is 1.4.  She cannot take metformin .  She is on Farxiga .

## 2023-12-14 ENCOUNTER — Other Ambulatory Visit: Payer: Self-pay

## 2023-12-17 ENCOUNTER — Other Ambulatory Visit: Payer: Self-pay

## 2023-12-18 ENCOUNTER — Other Ambulatory Visit: Payer: Self-pay

## 2023-12-23 ENCOUNTER — Other Ambulatory Visit: Payer: Self-pay

## 2023-12-25 ENCOUNTER — Other Ambulatory Visit: Payer: Self-pay

## 2023-12-30 ENCOUNTER — Other Ambulatory Visit: Payer: Self-pay

## 2023-12-30 DIAGNOSIS — F1190 Opioid use, unspecified, uncomplicated: Secondary | ICD-10-CM | POA: Diagnosis not present

## 2023-12-30 DIAGNOSIS — G894 Chronic pain syndrome: Secondary | ICD-10-CM | POA: Diagnosis not present

## 2023-12-30 MED ORDER — BUPRENORPHINE HCL-NALOXONE HCL 8-2 MG SL FILM
1.0000 | ORAL_FILM | Freq: Two times a day (BID) | SUBLINGUAL | 2 refills | Status: AC
  Filled 2023-12-30: qty 60, 30d supply, fill #0
  Filled 2024-01-28: qty 60, 30d supply, fill #1
  Filled 2024-02-26: qty 60, 30d supply, fill #2

## 2023-12-31 ENCOUNTER — Other Ambulatory Visit: Payer: Self-pay

## 2024-01-04 ENCOUNTER — Other Ambulatory Visit: Payer: Self-pay

## 2024-01-06 ENCOUNTER — Other Ambulatory Visit: Payer: Self-pay

## 2024-01-07 ENCOUNTER — Other Ambulatory Visit: Payer: Self-pay

## 2024-01-11 ENCOUNTER — Ambulatory Visit: Admitting: Family Medicine

## 2024-01-20 ENCOUNTER — Other Ambulatory Visit: Payer: Self-pay | Admitting: Family Medicine

## 2024-01-20 ENCOUNTER — Ambulatory Visit: Admitting: Family Medicine

## 2024-01-20 ENCOUNTER — Other Ambulatory Visit: Payer: Self-pay

## 2024-01-20 ENCOUNTER — Encounter: Payer: Self-pay | Admitting: Family Medicine

## 2024-01-20 ENCOUNTER — Telehealth: Payer: Self-pay | Admitting: Family Medicine

## 2024-01-20 VITALS — BP 152/75 | HR 81 | Ht 64.0 in | Wt 163.0 lb

## 2024-01-20 DIAGNOSIS — G43709 Chronic migraine without aura, not intractable, without status migrainosus: Secondary | ICD-10-CM

## 2024-01-20 DIAGNOSIS — I2510 Atherosclerotic heart disease of native coronary artery without angina pectoris: Secondary | ICD-10-CM | POA: Diagnosis not present

## 2024-01-20 DIAGNOSIS — E1129 Type 2 diabetes mellitus with other diabetic kidney complication: Secondary | ICD-10-CM | POA: Diagnosis not present

## 2024-01-20 DIAGNOSIS — R809 Proteinuria, unspecified: Secondary | ICD-10-CM

## 2024-01-20 DIAGNOSIS — I10 Essential (primary) hypertension: Secondary | ICD-10-CM

## 2024-01-20 NOTE — Progress Notes (Unsigned)
 Established Patient Office Visit  Subjective   Patient ID: Whitney Bernard, female    DOB: [birth year]  Age: 63 y.o. MRN: [id number]  Chief Complaint  Patient presents with   Weight Check    On OZEMPIC  87m f/u - no side effects Mentioned she took only amlodipine  for bp this morning at 10 am     HPI Whitney Bernard. Siegfried is a delightful 63 year old with DMT2, CKD 3A (Cr 1.4 baseline), chronic migraines, DDD cervical spine and chronic neck pain (s/p 2 C-spine surgeries, tried suboxone  but found it not effective), CAD (MI 05/2019, LHC 06/07/2019: CIRC PCI and DES), mixed hyperlipidemia, GERD and depression.   Her A1c one month ago was 9.7%.  She reports her fasting blood sugars run between 130 and 140.  She did have one fasting blood sugar that was 217.  She is on Ozempic  1.0 mg weekly and Farxiga  10 mg daily.  She cannot take metformin  secondary to renal function,  Baseline creatinine is 1.4.  Reports she is doing well and denies nausea and constipation.  She is no longer taking Amaryl  1 mg daily. She needs her Fioricet  refilled she has chronic migraines. She is on Coreg  6.25 mg, amlodipine  10 mg and hydrochlorothiazide  25 mg 1/2 tablet daily.  Blood pressure today is 152/75.  Pulse is 81.  Reports she checks her blood pressure at home and gets 150s over 70s.   Objective:     BP (!) 152/75 (BP Location: Right Arm, Patient Position: Sitting, Cuff Size: Normal)   Pulse 81   Ht 5' 4 (1.626 m)   Wt 163 lb (73.9 kg)   SpO2 96%   BMI 27.98 kg/m    Physical Exam Vitals and nursing note reviewed.  Constitutional:      Appearance: Normal appearance.  HENT:     Head: Normocephalic and atraumatic.  Eyes:     Conjunctiva/sclera: Conjunctivae normal.  Cardiovascular:     Rate and Rhythm: Normal rate and regular rhythm.  Pulmonary:     Effort: Pulmonary effort is normal.     Breath sounds: Normal breath sounds.  Musculoskeletal:     Right lower leg: No edema.     Left lower leg: No edema.  Skin:     General: Skin is warm and dry.  Neurological:     Mental Status: She is alert and oriented to person, place, and time.  Psychiatric:        Mood and Affect: Mood normal.        Behavior: Behavior normal.        Thought Content: Thought content normal.        Judgment: Judgment normal.          No results found for any visits on 01/20/24.    The ASCVD Risk score (Arnett DK, et al., 2019) failed to calculate for the following reasons:   Risk score cannot be calculated because patient has a medical history suggesting prior/existing ASCVD    Assessment & Plan:  Essential hypertension Assessment & Plan: Blood pressures running in the 140s to 150s systolic at home.  Currently on Coreg  6.25 mg twice daily, HCTZ 12.5 mg daily and amlodipine  10 mg.  Pulse is 81.  Increase Coreg  to 12.5 mg twice daily and continue amlodipine  10 mg and HCTZ 12.5.  Follow-up in a month for recheck.  Orders: -     Carvedilol ; Take 1 tablet (12.5 mg total) by mouth 2 (two) times daily with a meal.  Dispense: 60 tablet; Refill: 3  Type 2 diabetes mellitus with diabetic microalbuminuria, without long-term current use of insulin  (HCC) Assessment & Plan: Continue Ozempic  1 mg weekly and Farxiga  10 mg daily.  Due for an A1c check in 2 months follow-up in 60 days.  Orders: -     Semaglutide  (1 MG/DOSE); Inject 1 mg into the skin once a week.  Dispense: 3 mL; Refill: 2 -     Dapagliflozin  Propanediol; Take 1 tablet (10 mg total) by mouth daily before breakfast.  Dispense: 30 tablet; Refill: 5  Chronic migraine without aura without status migrainosus, not intractable Assessment & Plan: Refilled her butalbital .   Coronary artery disease involving native coronary artery of native heart without angina pectoris Assessment & Plan: Discussed the coronary calcium  score with her today and encouraged her to get this test.  She will contemplate whether she wants to spend the money.  She does take Lipitor 40 mg daily.   She does take 81 mg of aspirin  daily.      Return in about 8 weeks (around 03/16/2024).    Chistine Dematteo K Liley Rake, MD

## 2024-01-20 NOTE — Telephone Encounter (Signed)
 Copied from CRM #[id number]. Topic: Clinical - Medication Refill >> Jan 20, 2024 12:01 PM [name] wrote: Medication: butalbital -acetaminophen -caffeine  (FIORICET ) 50-325-40 MG tablet  Has the patient contacted their pharmacy? Yes, referred to provider (Agent: If no, request that the patient contact the pharmacy for the refill. If patient does not wish to contact the pharmacy document the reason why and proceed with request.) (Agent: If yes, when and what did the pharmacy advise?)  This is the patient's preferred pharmacy:  [facility] [address] [place] [address] Phone: [phone] Fax: [phone]  Is this the correct pharmacy for this prescription? Yes If no, delete pharmacy and type the correct one.   Has the prescription been filled recently? No  Is the patient out of the medication? Yes  Has the patient been seen for an appointment in the last year OR does the patient have an upcoming appointment? Yes  Can we respond through MyChart? No  Agent: Please be advised that Rx refills may take up to 3 business days. We ask that you follow-up with your pharmacy.

## 2024-01-21 ENCOUNTER — Other Ambulatory Visit: Payer: Self-pay | Admitting: Family Medicine

## 2024-01-21 ENCOUNTER — Telehealth: Payer: Self-pay | Admitting: Family Medicine

## 2024-01-21 ENCOUNTER — Other Ambulatory Visit: Payer: Self-pay

## 2024-01-21 DIAGNOSIS — G43709 Chronic migraine without aura, not intractable, without status migrainosus: Secondary | ICD-10-CM

## 2024-01-21 MED ORDER — BUTALBITAL-APAP-CAFFEINE 50-325-40 MG PO TABS
1.0000 | ORAL_TABLET | ORAL | 2 refills | Status: DC | PRN
  Filled 2024-01-21: qty 90, 15d supply, fill #0
  Filled 2024-02-04: qty 90, 15d supply, fill #1
  Filled 2024-02-19: qty 90, 15d supply, fill #2

## 2024-01-21 NOTE — Telephone Encounter (Signed)
 Copied from CRM [id number]. Topic: Clinical - Medication Refill >> Jan 21, 2024 11:16 AM Kendralyn S wrote: Medication: butalbital -acetaminophen -caffeine  (FIORICET ) 50-325-40 MG tablet  Has the patient contacted their pharmacy? Yes (Agent: If no, request that the patient contact the pharmacy for the refill. If patient does not wish to contact the pharmacy document the reason why and proceed with request.) (Agent: If yes, when and what did the pharmacy advise?)  This is the patient's preferred pharmacy:  [facility] [address] [place] [address] Phone: [phone] Fax: [phone]  Is this the correct pharmacy for this prescription? Yes If no, delete pharmacy and type the correct one.   Has the prescription been filled recently? Yes  Is the patient out of the medication? Yes  Has the patient been seen for an appointment in the last year OR does the patient have an upcoming appointment? Yes  Can we respond through MyChart? No  Agent: Please be advised that Rx refills may take up to 3 business days. We ask that you follow-up with your pharmacy.

## 2024-01-21 NOTE — Telephone Encounter (Signed)
 Advised that I thought her fiorcet had refills and it did not.  I submitted it to the pharmacy for her.

## 2024-01-22 ENCOUNTER — Other Ambulatory Visit: Payer: Self-pay

## 2024-01-22 MED ORDER — SEMAGLUTIDE (1 MG/DOSE) 4 MG/3ML ~~LOC~~ SOPN
1.0000 mg | PEN_INJECTOR | SUBCUTANEOUS | 2 refills | Status: AC
  Filled 2024-01-22: qty 3, 28d supply, fill #0
  Filled 2024-03-23: qty 3, 28d supply, fill #1

## 2024-01-22 MED ORDER — DAPAGLIFLOZIN PROPANEDIOL 10 MG PO TABS
10.0000 mg | ORAL_TABLET | Freq: Every day | ORAL | 5 refills | Status: AC
  Filled 2024-01-22: qty 30, 30d supply, fill #0

## 2024-01-24 ENCOUNTER — Other Ambulatory Visit: Payer: Self-pay

## 2024-01-24 MED ORDER — CARVEDILOL 12.5 MG PO TABS
12.5000 mg | ORAL_TABLET | Freq: Two times a day (BID) | ORAL | 3 refills | Status: AC
  Filled 2024-01-24: qty 60, 30d supply, fill #0

## 2024-01-24 NOTE — Assessment & Plan Note (Addendum)
 Discussed the coronary calcium  score with her today and encouraged her to get this test.  She will contemplate whether she wants to spend the money.  She does take Lipitor 40 mg daily.  She does take 81 mg of aspirin  daily.

## 2024-01-24 NOTE — Assessment & Plan Note (Signed)
 Continue Ozempic  1 mg weekly and Farxiga  10 mg daily.  Due for an A1c check in 2 months follow-up in 60 days.

## 2024-01-24 NOTE — Assessment & Plan Note (Signed)
 Refilled her butalbital .

## 2024-01-24 NOTE — Assessment & Plan Note (Signed)
 Blood pressures running in the 140s to 150s systolic at home.  Currently on Coreg  6.25 mg twice daily, HCTZ 12.5 mg daily and amlodipine  10 mg.  Pulse is 81.  Increase Coreg  to 12.5 mg twice daily and continue amlodipine  10 mg and HCTZ 12.5.  Follow-up in a month for recheck.

## 2024-01-25 ENCOUNTER — Other Ambulatory Visit: Payer: Self-pay

## 2024-01-28 ENCOUNTER — Other Ambulatory Visit: Payer: Self-pay

## 2024-01-29 ENCOUNTER — Other Ambulatory Visit: Payer: Self-pay

## 2024-02-01 ENCOUNTER — Other Ambulatory Visit: Payer: Self-pay

## 2024-02-04 ENCOUNTER — Other Ambulatory Visit: Payer: Self-pay

## 2024-02-15 ENCOUNTER — Other Ambulatory Visit: Payer: Self-pay

## 2024-02-17 ENCOUNTER — Other Ambulatory Visit: Payer: Self-pay

## 2024-02-19 ENCOUNTER — Other Ambulatory Visit: Payer: Self-pay

## 2024-02-23 ENCOUNTER — Other Ambulatory Visit: Payer: Self-pay

## 2024-02-26 ENCOUNTER — Other Ambulatory Visit: Payer: Self-pay

## 2024-02-29 ENCOUNTER — Other Ambulatory Visit: Payer: Self-pay | Admitting: Family Medicine

## 2024-02-29 DIAGNOSIS — G43709 Chronic migraine without aura, not intractable, without status migrainosus: Secondary | ICD-10-CM

## 2024-02-29 DIAGNOSIS — F419 Anxiety disorder, unspecified: Secondary | ICD-10-CM

## 2024-02-29 NOTE — Telephone Encounter (Unsigned)
 Copied from CRM [id number]. Topic: Clinical - Medication Refill >> Feb 29, 2024  2:12 PM Antony RAMAN wrote: Medication: ALPRAZolam  (XANAX ) 0.5 MG tablet butalbital -acetaminophen -caffeine  (FIORICET ) 50-325-40 MG tablet  Has the patient contacted their pharmacy? Yes (Agent: If no, request that the patient contact the pharmacy for the refill. If patient does not wish to contact the pharmacy document the reason why and proceed with request.) (Agent: If yes, when and what did the pharmacy advise?)  This is the patient's preferred pharmacy:  [facility] [address] [place] [address] Phone: [phone] Fax: [phone]  Is this the correct pharmacy for this prescription? Yes If no, delete pharmacy and type the correct one.   Has the prescription been filled recently? No  Is the patient out of the medication? Yes  Has the patient been seen for an appointment in the last year OR does the patient have an upcoming appointment? Yes  Can we respond through MyChart? Yes  Agent: Please be advised that Rx refills may take up to 3 business days. We ask that you follow-up with your pharmacy.

## 2024-03-01 ENCOUNTER — Telehealth: Payer: Self-pay | Admitting: Family Medicine

## 2024-03-01 ENCOUNTER — Other Ambulatory Visit: Payer: Self-pay

## 2024-03-01 MED ORDER — ALPRAZOLAM 0.5 MG PO TABS
0.5000 mg | ORAL_TABLET | Freq: Two times a day (BID) | ORAL | 0 refills | Status: AC
  Filled 2024-03-01 – 2024-03-03 (×3): qty 60, 30d supply, fill #0

## 2024-03-01 MED ORDER — BUTALBITAL-APAP-CAFFEINE 50-325-40 MG PO TABS
1.0000 | ORAL_TABLET | ORAL | 2 refills | Status: AC | PRN
  Filled 2024-03-01 – 2024-03-03 (×2): qty 90, 15d supply, fill #0
  Filled 2024-03-16: qty 90, 15d supply, fill #1
  Filled ????-??-??: fill #2

## 2024-03-01 NOTE — Telephone Encounter (Signed)
 Was not aware that she was going to get back on Suboxone  when I told her it was alright to take the xanax .  Not supposed to take these together.  Advised that I will give her 60 xanax  and she can taper herself of of these.  She agrees.  Asked for a refill of butalbital  as well.

## 2024-03-02 ENCOUNTER — Other Ambulatory Visit: Payer: Self-pay

## 2024-03-03 ENCOUNTER — Other Ambulatory Visit: Payer: Self-pay

## 2024-03-09 ENCOUNTER — Ambulatory Visit: Admitting: Family Medicine

## 2024-03-14 ENCOUNTER — Other Ambulatory Visit: Payer: Self-pay

## 2024-03-15 ENCOUNTER — Other Ambulatory Visit: Payer: Self-pay

## 2024-03-16 ENCOUNTER — Other Ambulatory Visit: Payer: Self-pay

## 2024-03-21 ENCOUNTER — Telehealth: Payer: Self-pay | Admitting: Family Medicine

## 2024-03-22 ENCOUNTER — Encounter: Payer: Self-pay | Admitting: Family Medicine

## 2024-03-22 ENCOUNTER — Other Ambulatory Visit: Payer: Self-pay

## 2024-03-22 ENCOUNTER — Ambulatory Visit (INDEPENDENT_AMBULATORY_CARE_PROVIDER_SITE_OTHER): Admitting: Family Medicine

## 2024-03-22 VITALS — BP 162/94 | HR 90 | Ht 64.0 in | Wt 162.5 lb

## 2024-03-22 DIAGNOSIS — E782 Mixed hyperlipidemia: Secondary | ICD-10-CM

## 2024-03-22 DIAGNOSIS — E1129 Type 2 diabetes mellitus with other diabetic kidney complication: Secondary | ICD-10-CM | POA: Diagnosis not present

## 2024-03-22 DIAGNOSIS — F419 Anxiety disorder, unspecified: Secondary | ICD-10-CM

## 2024-03-22 DIAGNOSIS — R809 Proteinuria, unspecified: Secondary | ICD-10-CM | POA: Diagnosis not present

## 2024-03-22 DIAGNOSIS — N1831 Chronic kidney disease, stage 3a: Secondary | ICD-10-CM

## 2024-03-22 DIAGNOSIS — D509 Iron deficiency anemia, unspecified: Secondary | ICD-10-CM | POA: Diagnosis not present

## 2024-03-22 DIAGNOSIS — I10 Essential (primary) hypertension: Secondary | ICD-10-CM | POA: Diagnosis not present

## 2024-03-22 MED ORDER — ALPRAZOLAM 0.5 MG PO TABS
0.5000 mg | ORAL_TABLET | Freq: Two times a day (BID) | ORAL | 2 refills | Status: AC | PRN
  Filled 2024-03-22: qty 60, 30d supply, fill #0

## 2024-03-22 NOTE — Assessment & Plan Note (Signed)
 He is not on an ACE/ARB but is on Farxiga  10 mg daily.  Baseline creatinine 1.4.  Followed by nephrology.

## 2024-03-22 NOTE — Assessment & Plan Note (Signed)
 Checking CBC today

## 2024-03-24 ENCOUNTER — Telehealth: Payer: Self-pay | Admitting: Family Medicine

## 2024-03-24 ENCOUNTER — Ambulatory Visit: Payer: Self-pay | Admitting: Family Medicine

## 2024-03-24 LAB — COMPREHENSIVE METABOLIC PANEL WITH GFR
ALT: 17 [IU]/L (ref 0–32)
AST: 11 [IU]/L (ref 0–40)
Albumin: 4.6 g/dL (ref 3.9–4.9)
Alkaline Phosphatase: 125 [IU]/L (ref 49–135)
BUN/Creatinine Ratio: 19 (ref 12–28)
BUN: 23 mg/dL (ref 8–27)
Bilirubin Total: <0.2 mg/dL (ref 0.0–1.2)
CO2: 16 mmol/L — ABNORMAL LOW (ref 20–29)
Calcium: 10 mg/dL (ref 8.7–10.3)
Chloride: 103 mmol/L (ref 96–106)
Creatinine, Ser: 1.2 mg/dL — ABNORMAL HIGH (ref 0.57–1.00)
Globulin, Total: 3.2 g/dL (ref 1.5–4.5)
Glucose: 150 mg/dL — ABNORMAL HIGH (ref 70–99)
Potassium: 5.1 mmol/L (ref 3.5–5.2)
Sodium: 137 mmol/L (ref 134–144)
Total Protein: 7.8 g/dL (ref 6.0–8.5)
eGFR: 50 mL/min/{1.73_m2} — ABNORMAL LOW

## 2024-03-24 LAB — HEMOGLOBIN A1C
Est. average glucose Bld gHb Est-mCnc: 157 mg/dL
Hgb A1c MFr Bld: 7.1 % — ABNORMAL HIGH (ref 4.8–5.6)

## 2024-03-24 LAB — LIPID PANEL
Chol/HDL Ratio: 7.7 ratio — ABNORMAL HIGH (ref 0.0–4.4)
Cholesterol, Total: 322 mg/dL — ABNORMAL HIGH (ref 100–199)
HDL: 42 mg/dL
LDL Chol Calc (NIH): 207 mg/dL — ABNORMAL HIGH (ref 0–99)
Triglycerides: 356 mg/dL — ABNORMAL HIGH (ref 0–149)
VLDL Cholesterol Cal: 73 mg/dL — ABNORMAL HIGH (ref 5–40)

## 2024-03-24 LAB — CBC WITH DIFFERENTIAL/PLATELET
Basophils Absolute: 0.1 10*3/uL (ref 0.0–0.2)
Basos: 1 %
EOS (ABSOLUTE): 0.3 10*3/uL (ref 0.0–0.4)
Eos: 2 %
Hematocrit: 42.4 % (ref 34.0–46.6)
Hemoglobin: 13.8 g/dL (ref 11.1–15.9)
Immature Grans (Abs): 0.1 10*3/uL (ref 0.0–0.1)
Immature Granulocytes: 1 %
Lymphocytes Absolute: 1.8 10*3/uL (ref 0.7–3.1)
Lymphs: 13 %
MCH: 28.2 pg (ref 26.6–33.0)
MCHC: 32.5 g/dL (ref 31.5–35.7)
MCV: 87 fL (ref 79–97)
Monocytes Absolute: 0.8 10*3/uL (ref 0.1–0.9)
Monocytes: 6 %
Neutrophils Absolute: 10.5 10*3/uL — ABNORMAL HIGH (ref 1.4–7.0)
Neutrophils: 77 %
Platelets: 429 10*3/uL (ref 150–450)
RBC: 4.89 x10E6/uL (ref 3.77–5.28)
RDW: 13.7 % (ref 11.7–15.4)
WBC: 13.6 10*3/uL — ABNORMAL HIGH (ref 3.4–10.8)

## 2024-03-24 LAB — MICROALBUMIN / CREATININE URINE RATIO
Creatinine, Urine: 119.4 mg/dL
Microalb/Creat Ratio: 640 mg/g{creat} — ABNORMAL HIGH (ref 0–29)
Microalbumin, Urine: 764.7 ug/mL

## 2024-03-24 NOTE — Telephone Encounter (Signed)
 Called Home number and no answer.  Called mobile and no VM.

## 2024-03-25 ENCOUNTER — Other Ambulatory Visit: Payer: Self-pay

## 2024-04-19 ENCOUNTER — Ambulatory Visit: Admitting: Family Medicine
